# Patient Record
Sex: Male | Born: 1945 | Race: White | Hispanic: No | Smoking: Former smoker
Health system: Southern US, Community
[De-identification: ages and names within clinical notes are randomized; demographics above are authoritative.]

## PROBLEM LIST (undated history)

## (undated) DIAGNOSIS — N189 Chronic kidney disease, unspecified: Secondary | ICD-10-CM

## (undated) DIAGNOSIS — E785 Hyperlipidemia, unspecified: Secondary | ICD-10-CM

## (undated) DIAGNOSIS — M549 Dorsalgia, unspecified: Secondary | ICD-10-CM

## (undated) DIAGNOSIS — H35039 Hypertensive retinopathy, unspecified eye: Secondary | ICD-10-CM

## (undated) DIAGNOSIS — I10 Essential (primary) hypertension: Secondary | ICD-10-CM

## (undated) DIAGNOSIS — R001 Bradycardia, unspecified: Secondary | ICD-10-CM

## (undated) DIAGNOSIS — G8929 Other chronic pain: Secondary | ICD-10-CM

## (undated) DIAGNOSIS — G5603 Carpal tunnel syndrome, bilateral upper limbs: Secondary | ICD-10-CM

## (undated) DIAGNOSIS — M1990 Unspecified osteoarthritis, unspecified site: Secondary | ICD-10-CM

## (undated) DIAGNOSIS — R51 Headache: Secondary | ICD-10-CM

## (undated) DIAGNOSIS — J189 Pneumonia, unspecified organism: Secondary | ICD-10-CM

## (undated) DIAGNOSIS — G709 Myoneural disorder, unspecified: Secondary | ICD-10-CM

## (undated) DIAGNOSIS — Z973 Presence of spectacles and contact lenses: Secondary | ICD-10-CM

## (undated) DIAGNOSIS — F32A Depression, unspecified: Secondary | ICD-10-CM

## (undated) DIAGNOSIS — J449 Chronic obstructive pulmonary disease, unspecified: Secondary | ICD-10-CM

## (undated) DIAGNOSIS — Z950 Presence of cardiac pacemaker: Secondary | ICD-10-CM

## (undated) DIAGNOSIS — G4700 Insomnia, unspecified: Secondary | ICD-10-CM

## (undated) DIAGNOSIS — F329 Major depressive disorder, single episode, unspecified: Secondary | ICD-10-CM

## (undated) DIAGNOSIS — K219 Gastro-esophageal reflux disease without esophagitis: Secondary | ICD-10-CM

## (undated) HISTORY — PX: MULTIPLE TOOTH EXTRACTIONS: SHX2053

## (undated) HISTORY — DX: Depression, unspecified: F32.A

## (undated) HISTORY — PX: EYE SURGERY: SHX253

## (undated) HISTORY — PX: INSERT / REPLACE / REMOVE PACEMAKER: SUR710

## (undated) HISTORY — DX: Hyperlipidemia, unspecified: E78.5

## (undated) HISTORY — DX: Headache: R51

## (undated) HISTORY — DX: Essential (primary) hypertension: I10

## (undated) HISTORY — PX: CATARACT EXTRACTION: SUR2

## (undated) HISTORY — DX: Dorsalgia, unspecified: M54.9

## (undated) HISTORY — PX: BACK SURGERY: SHX140

## (undated) HISTORY — DX: Insomnia, unspecified: G47.00

## (undated) HISTORY — PX: HEMORRHOID SURGERY: SHX153

## (undated) HISTORY — DX: Major depressive disorder, single episode, unspecified: F32.9

## (undated) HISTORY — DX: Bradycardia, unspecified: R00.1

## (undated) HISTORY — DX: Hypertensive retinopathy, unspecified eye: H35.039

## (undated) HISTORY — PX: PACEMAKER INSERTION: SHX728

## (undated) HISTORY — DX: Other chronic pain: G89.29

---

## 1997-11-13 ENCOUNTER — Ambulatory Visit (HOSPITAL_COMMUNITY): Admission: RE | Admit: 1997-11-13 | Discharge: 1997-11-13 | Payer: Self-pay | Admitting: Family Medicine

## 2005-11-26 ENCOUNTER — Emergency Department (HOSPITAL_COMMUNITY): Admission: EM | Admit: 2005-11-26 | Discharge: 2005-11-26 | Payer: Self-pay | Admitting: Emergency Medicine

## 2005-12-22 ENCOUNTER — Ambulatory Visit (HOSPITAL_COMMUNITY): Admission: RE | Admit: 2005-12-22 | Discharge: 2005-12-23 | Payer: Self-pay | Admitting: Cardiology

## 2006-02-01 ENCOUNTER — Emergency Department (HOSPITAL_COMMUNITY): Admission: EM | Admit: 2006-02-01 | Discharge: 2006-02-01 | Payer: Self-pay | Admitting: Emergency Medicine

## 2008-04-07 ENCOUNTER — Encounter: Admission: RE | Admit: 2008-04-07 | Discharge: 2008-04-07 | Payer: Self-pay | Admitting: Orthopedic Surgery

## 2009-11-28 ENCOUNTER — Encounter: Payer: Self-pay | Admitting: Internal Medicine

## 2010-06-24 ENCOUNTER — Encounter: Payer: Self-pay | Admitting: Internal Medicine

## 2010-06-24 ENCOUNTER — Ambulatory Visit (INDEPENDENT_AMBULATORY_CARE_PROVIDER_SITE_OTHER): Payer: Medicare Other | Admitting: Internal Medicine

## 2010-06-24 DIAGNOSIS — Z950 Presence of cardiac pacemaker: Secondary | ICD-10-CM

## 2010-06-24 DIAGNOSIS — I498 Other specified cardiac arrhythmias: Secondary | ICD-10-CM

## 2010-06-24 DIAGNOSIS — R0609 Other forms of dyspnea: Secondary | ICD-10-CM

## 2010-06-24 DIAGNOSIS — E785 Hyperlipidemia, unspecified: Secondary | ICD-10-CM

## 2010-06-24 DIAGNOSIS — R0989 Other specified symptoms and signs involving the circulatory and respiratory systems: Secondary | ICD-10-CM

## 2010-06-24 DIAGNOSIS — E1169 Type 2 diabetes mellitus with other specified complication: Secondary | ICD-10-CM | POA: Insufficient documentation

## 2010-07-02 NOTE — Assessment & Plan Note (Signed)
Summary: nep. dm x 2; htn , pacemaker secondary to bradycardia. self r...   CC:  pacemaker check.  Pt states there are no cardiac concerns.  History of Present Illness: [name] [name] is  seen at the request of his daughter establish pacemaker care in the wake of Dr. Ty Hilts leaving town.  In 2007 he underwent pacemaker implantation for the operative report describes as chronic bradycardia. The preoperative evaluation not available in the chart. The patient understands that his heart muscle function is normal. He has no history of coronary disease.    He does very diagnoses of COPD and his biggest limitation is now having shortness of breath.   He also has a history of arthritis and hypertension  Current Medications (verified): 1)  Aspirin 81 Mg Tabs (Aspirin) .... Take One Tablet Once Daily 2)  Singulair 10 Mg Tabs (Montelukast Sodium) .... Take One Tablet Once Daily 3)  Simvastatin 80 Mg Tabs (Simvastatin) .... Take One Tablet Once Daily 4)  Gabapentin 100 Mg Caps (Gabapentin) .... Take One Tablet As Needed For Pain.  Pt Takes About 2 Tablets Daily 5)  Losartan Potassium 100 Mg Tabs (Losartan Potassium) .... Take One Tablet Once Daily 6)  Triamterene-Hctz 37.5-25 Mg Tabs (Triamterene-Hctz) .... Take One Tablet Once Daily 7)  Naprosyn 500 Mg Tabs (Naproxen) .... Take One Tablet As Needed For Pain 8)  B Complex-B12  Tabs (B Complex Vitamins) .... Once Daily 9)  Vitamin E 400 Unit Caps (Vitamin E) .... Take One Capsule Once Daily  Allergies (verified): 1)  ! Penicillin 2)  ! Codeine 3)  ! Lopressor  Past History:  Past Medical History: Last updated: 06/21/2010 DM-type II Hypertension Headache Bradycardia-Pacemaker-Medtronic Adapta #ADDRO1/August 20. 2007 Hyperlipidemia Depression Insomnia  Chronic back pain  Past Surgical History: Last updated: 06/21/2010 back surgery hemorrhoidectomy  Family History: Last updated: 06/21/2010 not significant for early coronary  disease  Social History: Last updated: 06/21/2010 Tobacco Use - Former. -quit 1978 Single  Alcohol Use - no  Review of Systems       full review of systems was negative apart from a history of present illness and past medical history.   Vital Signs:  Patient profile:   65 year old male Height:      72 inches Weight:      214 pounds BMI:     29.13 Pulse rate:   76 / minute Pulse rhythm:   regular BP sitting:   122 / 82  (right arm) Cuff size:   large  Vitals Entered By: Judithe Modest CMA (June 24, 2010 5:00 PM)  Physical Exam  General:  Alert and oriented older Caucasian male appearing his stated age in no acute distress. HEENT  normal . Neck veins were flat; carotids brisk and full without bruits. No lymphadenopathy. Back without kyphosis. Lungs clear. Heart sounds regular without murmurs or gallops. PMI nondisplaced. Abdomen soft with active bowel sounds without midline pulsation or hepatomegaly. Femoral pulses and distal pulses intact. Extremities were without clubbing cyanosis or edemaSkin warm and dry.no lymphadenopathy; neck was supple  Neurological exam grossly normal; affect is engaging    EKG  Procedure date:  06/24/2010  Findings:      atrial paced  Impression & Recommendations:  Problem # 1:  DYSPNEA ON EXERTION (ICD-786.09) the patient has dyspnea on exertion. This could either be secondary to COPD or perhaps to his chronotropic incompetence identified by interrogation of his pacemaker. In hopes that it is the latter we have reprogrammed his device to  increase his maximal heartsensor rate as well as his ADL rate.  He will let us know His updated medication list for this problem includes:    Aspirin 81 Mg Tabs (Aspirin) .[name]... Take one tablet once daily    Losartan Potassium 100 Mg Tabs (Losartan potassium) .[name]... Take one tablet once daily    Triamterene-hctz 37.5-25 Mg Tabs (Triamterene-hctz) .[name]... Take one tablet once daily  Problem # 2:   HYPERLIPIDEMIA, BORDERLINE (ICD-272.4) will change his simvsattin to 40 mg  His updated medication list for this problem includes:    Simvastatin 80 Mg Tabs (Simvastatin) .[name]... 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 Take one tablet once daily  Patient Instructions: 1)  Your physician recommends that you schedule a follow-up appointment in: 3 MONTHS WITH DR Graciela Husbands 2)  Your physician recommends that you continue on your current medications as directed. Please refer to the Current Medication list given to you today.DECREASE SIMVASTATIN TO 40 MG once daily

## 2010-07-11 NOTE — Cardiovascular Report (Signed)
Summary: Office Visit   Office Visit   Imported By: Roderic Ovens 07/04/2010 16:03:25  _____________________________________________________________________  External Attachment:    Type:   Image     Comment:   External Document

## 2010-07-23 NOTE — Letter (Signed)
Summary: [facility] 2008-2011  [facility] 2008-2011   Imported By: Marylou Mccoy 07/15/2010 10:07:51  _____________________________________________________________________  External Attachment:    Type:   Image     Comment:   External Document

## 2010-09-17 ENCOUNTER — Encounter: Payer: Self-pay | Admitting: Internal Medicine

## 2010-09-20 NOTE — Discharge Summary (Signed)
[name] [name], [name] [name]               ACCOUNT NO.:  [id number]   MEDICAL RECORD NO.:  [id number]          PATIENT TYPE:  OIB   LOCATION:  6527                         FACILITY:  [facility]   PHYSICIAN:  Francisca December, M.D.  DATE OF BIRTH:  [birth year]   DATE OF ADMISSION:  12/22/2005  DATE OF DISCHARGE:  12/23/2005                                 DISCHARGE SUMMARY   ADDENDUM   CHEST X-RAYS:  Chest x-ray results:  Satisfactory placement of permanent  pacemaker. No pneumothorax. No evidence of active chest disease.   The patient is being discharged to home in stable condition.      Tylene Fantasia, [place]      Francisca December, M.D.  Electronically Signed    RDM/MEDQ  D:  12/23/2005  T:  12/24/2005  Job:  [id number]   cc:   Melida Quitter, M.D.

## 2010-09-20 NOTE — Op Note (Signed)
NAMEFERNIE, [name] [name]               ACCOUNT NO.:  [id number]   MEDICAL RECORD NO.:  [id number]          PATIENT TYPE:  AMB   LOCATION:  SDS                          FACILITY:  [facility]   PHYSICIAN:  Francisca December, M.D.  DATE OF BIRTH:  08-29-1945   DATE OF PROCEDURE:  12/22/2005  DATE OF DISCHARGE:                                 OPERATIVE REPORT   PROCEDURE PERFORMED:  1. Insertion dual-chamber permanent transvenous pacemaker.  2. Left subclavian venogram.   INDICATION:  Mr. Vitug is a 65 year old man whose developed a chronic  bradycardia which was initially medically treated with aminophylline  however, has become more prevalent.  The patient reports symptoms of dyspnea  and increasing fatigue/lethargy.  Heart rates have been documented as low as  37 beats per minute while awake.  The patient is brought to the  catheterization laboratory at this time for insertion of a permanent dual-  chamber transvenous pacemaker with a diagnosis of symptomatic sinus node  dysfunction.   DESCRIPTION OF PROCEDURE:  The patient was brought to the cardiac  catheterization laboratory in the fasting state.  The left prepectoral  region was prepped and draped in the usual sterile fashion.  Local  anesthesia was obtained with infiltration of 1% lidocaine with epinephrine  throughout the left prepectoral region.  The left subclavian venogram was  then performed with a peripheral injection of 20 mL of Omnipaque.  A digital  cine angiogram was obtained and road mapped in the AP projection to guide  future left subclavian puncture.  The venogram did demonstrate the vein to  be widely patent and coursing in a normal fashion over the anterior surface  of the first rib and beneath the middle third of the clavicle.  There was no  evidence for persistence of the left superior vena cava.   A 6-7 cm incision was then made in the deltopectoral groove and this was  carried down by sharp dissection and  electrocautery to the prepectoral  fascia.  There a plane was lifted and a pocket formed inferiorly and  medially utilizing blunt dissection electrocautery.  The pocket was then  packed with a 1% kanamycin soaked gauze.  The left subclavian vein was then  punctured twice using an 18-gauge thin-wall needle through which was passed  a 0.038-inch tight J guidewire.  Over the initial guidewire, a 7-French  tearaway sheath and dilator were advanced.  The dilator and wire were  removed and the ventricular lead was advanced to the level of the right  atrium and the sheath was torn away.  Using standard technique and  fluoroscopic landmarks, the lead was manipulated into the right ventricular  apex.  There excellent pacing parameters were obtained as will be noted  below.  The lead was tested for diaphragmatic pacing at 10 volts and none  was found.  The lead was then sutured into place using three separate #0  silk ligatures.  Over the remaining guidewire, a 9-French tearaway sheath  and dilator were advanced.  The dilator was removed, the wire was allowed to  remain in place and  the atrial lead was advanced to the level of the right  atrium.  The sheath was torn away.  A figure-of-eight hemostasis suture was  placed in the pectoralis muscle around the insertion site of the leads to  control backbleeding.  Using standard technique and fluoroscopic landmarks,  the lead was manipulated into the right atrial appendage.  There adequate  pacing parameters were obtained as will be noted below.  This was an active  fixation lead and the screw was advanced as appropriate.  The lead was  tested for diaphragmatic pacing at 10 volts and none was found.  The lead  was then sutured into place using three separate #0 silk ligatures.  The  remaining guidewire and the kanamycin soaked gauze were removed from the  pocket and the pocket was copiously irrigated using 1% kanamycin solution.  The leads were then  attached to the pacing generator carefully identifying  each by its serial number and placing each into the appropriate receptacle.  Each lead was tightened into place and tested for security.  The leads were  then wound beneath the pacing generator and the generator was placed in the  pocket.  The pocket was inspected for bleeding and none was found.  The  pocket was then closed using 2-0 Vicryl in a running fashion for the  subcutaneous layer.  The skin was approximated using 4-0 Vicryl in a running  subcuticular fashion.  Steri-Strips and a sterile dressing were applied and  the patient was transported to the recovery area in stable condition in A-  pace, V-sense mode.   EQUIPMENT DATA:  The pacing generator is a Medtronic Adapta model #ADDRO1,  serial [id number].  The atrial lead is a Medtronic model [id number], serial  [id number].  The ventricular lead is Medtronic model [id number], serial  [id number].   PACING DATA:  The ventricular lead detected a 9.1 mV R wave.  The pacing  threshold was 0.4 volts at 0.5 msec pulse width.  The impedance was 731 ohms  resulting in a current at capture threshold was 0.5 MA.  The atrial lead  detected a 2 mV P-wave.  The pacing threshold was 0.4 volts at 0.5 msec  pulse width.  The impedance was 515 ohms resulting in a current at capture  threshold of 3.5 MA.      Francisca December, M.D.  Electronically Signed     JHE/MEDQ  D:  12/22/2005  T:  12/22/2005  Job:  [id number]   cc:   Holley Bouche, M.D.

## 2010-09-20 NOTE — Discharge Summary (Signed)
[name] [name], [name] [name]               ACCOUNT NO.:  [id number]   MEDICAL RECORD NO.:  [id number]          PATIENT TYPE:  OIB   LOCATION:  6527                         FACILITY:  [facility]   PHYSICIAN:  Francisca December, M.D.  DATE OF BIRTH:  1945/06/27   DATE OF ADMISSION:  12/22/2005  DATE OF DISCHARGE:                                 DISCHARGE SUMMARY   ADMISSION DIAGNOSIS:  Bradycardia.   DISCHARGE DIAGNOSES:  1. Bradycardia status post dual-chamber permanent pacemaker implantation      on August 20. 2007.  2. Hypertension.  3. Dyslipidemia.  4. Chronic insomnia.  5. Depression with sleep disorder.  6. DJD.  7. Mechanical low back pain.   PROCEDURES:  Dual-chamber permanent pacemaker implantation on December 22, 2005.   HOSPITAL COURSE:  Mr. Wacker is a 65 year old Caucasian male with a with a  history of hypertension and dyslipidemia.  He presented to the [facility]  on December 22, 2005 for a dual-chamber permanent pacemaker  implantation secondary to bradycardia.  A Medtronic device was implanted  without complications and minimal blood loss.  The procedure was well  tolerated by the patient.  On December 23, 2005, the Medtronic representative  presented for pacemaker interrogation  The device was functioning normally  without episode so no changes were made during this session.  All electrical  parameters were within normal limits.  Upon evaluation this morning, the  patient complained of pain at the permanent pacemaker insertion site,  otherwise there were no complaints.  The wound site was warm and dry without  bleeding or oozing.  On telemetry the patient was in normal sinus rhythm  with atrial pacing at a ventricular rate of 62 beats per minute.  A chest x-  ray was obtained this morning, however the results are pending.  Pending the  results of the chest x-ray, the patient will be discharged to home today  after being seen by Dr. Amil Amen, his cardiologist.  Discharge  orders will be  written by Dr. Amil Amen.   LABORATORY DATA:  Preop laboratory data is as follows: sodium 140, potassium  5.6, chloride 100. CO2 33.  BUN five, creatinine 1.1, glucose 107, white  blood count 5.9, hemoglobin 14.2, hematocrit 42.3 platelets 246,  PT 11.7  INR 0.94, PTT 32,   Preoperative x-ray is as follows.  Portable chest x-ray November 26, 2005, no  active disease.   EKG December 12, 2005, sinus bradycardia with an incomplete right bundle  branch block with a ventricular rate of 55 beats per minute with no ischemic  changes.  EKG December 22, 2005 usual P axis with possible ectopic atrial bradycardia  with a ventricular rate of 59 beats per minute with no ischemic changes.   CONDITION ON DISCHARGE:  The patient is currently in stable condition  without any complaints of chest pain, shortness of breath, diaphoresis, or  dizziness.  His only complaint upon evaluation is pain at the incision site  of the permanent pacemaker.   DISCHARGE MEDICATIONS:  1. Altace 10 mg daily.  2. Enteric-coated aspirin 81 mg daily.  3. Simvastatin 40 mg every other day.  4. Naprosyn 500 mg twice daily.  5. Vicodin as needed.  6. Tylenol 650  mg as needed.  7. CO-24 400 mg daily.  8. Darvocet-N-100 tablets every 4-6 hours as needed for pain.  This      represents a new prescription.  A prescription was given with one      refill   DISCHARGE INSTRUCTIONS:  1. The patient was instructed to continue to follow a heart healthy diet      including low salt, low cholesterol, and low-fat  2. The patient was instructed to avoid driving for 2 weeks.  3. The patient was instructed to avoid lifting for 6-8 weeks.  4. The patient was instructed to avoid showering for 1 week and may shower      and begin driving on December 29, 2005.  5. The patient may begin raising his left arm to his shoulder on December 25, 2005.  He may begin raising his left arm to his left ear on December 27, 2005.  He may  begin raising his left arm to the top of his left      side of his head on December 29, 2005.  He may begin raising his left arm      across to his right ear on January 01, 2006.  6. The patient has been instructed to keep the wound area clean and dry      and not to get this area wet for 1 week.  7. The patient has been instructed that the Steri-Strips will fall off of      the wound, that he should not pull them off..  He has also been      instructed to not apply any creams, oils, or ointment to the wound      area.  8. The patient has been instructed that he should call the [facility]      cardiology office at once and [phone] if he notices any drainage or      discharge from the wound, any swelling or bruising at the site or if he      develops a fever greater than 101 degrees Fahrenheit.  9. The patient was given supplemental discharge instructions for pacemaker      patients to take home with him.   FOLLOW-UP ARRANGEMENTS:  The patient has been scheduled for a wound check at  the [facility] cardiology office on 01/06/2006 at 10:30 a.m. with a physician  extender.  He is scheduled to call [phone] if he is unable to make the  scheduled appointment.  1. The patient has been scheduled for follow-up visit with Dr. Corliss Marcus on 02/21/2006 at 1 o'clock p.m..      He should call [phone] if he is unable to make the scheduled      appointment.  2. The patient is scheduled to call [facility] cardiology should he develop any      problems or have questions in the interim.      Tylene Fantasia, [place]      Francisca December, M.D.  Electronically Signed    RDM/MEDQ  D:  12/23/2005  T:  12/23/2005  Job:  [id number]   cc:   Melida Quitter, M.D.

## 2010-09-24 ENCOUNTER — Ambulatory Visit (INDEPENDENT_AMBULATORY_CARE_PROVIDER_SITE_OTHER): Payer: Medicare Other | Admitting: Internal Medicine

## 2010-09-24 ENCOUNTER — Encounter: Payer: Self-pay | Admitting: Internal Medicine

## 2010-09-24 DIAGNOSIS — I498 Other specified cardiac arrhythmias: Secondary | ICD-10-CM

## 2010-09-24 DIAGNOSIS — Z950 Presence of cardiac pacemaker: Secondary | ICD-10-CM | POA: Insufficient documentation

## 2010-09-24 DIAGNOSIS — I495 Sick sinus syndrome: Secondary | ICD-10-CM | POA: Insufficient documentation

## 2010-09-24 DIAGNOSIS — R030 Elevated blood-pressure reading, without diagnosis of hypertension: Secondary | ICD-10-CM

## 2010-09-24 DIAGNOSIS — I4589 Other specified conduction disorders: Secondary | ICD-10-CM | POA: Insufficient documentation

## 2010-09-24 DIAGNOSIS — IMO0001 Reserved for inherently not codable concepts without codable children: Secondary | ICD-10-CM

## 2010-09-24 DIAGNOSIS — R0609 Other forms of dyspnea: Secondary | ICD-10-CM

## 2010-09-24 DIAGNOSIS — R0989 Other specified symptoms and signs involving the circulatory and respiratory systems: Secondary | ICD-10-CM

## 2010-09-24 NOTE — Assessment & Plan Note (Signed)
The patient's device was interrogated and the information was fully reviewed.  The device was reprogrammed to alter the rate response with a slower slope to ADL and a slower slope to the upper sensor rate

## 2010-09-24 NOTE — Assessment & Plan Note (Signed)
The patient is about 80% atrially paced. Looking at the heart rate histogram there was a right shift it appears to be related to overzealous exertion rates. We reviewed programmed the ADL rate and had him walk again and he did somewhat better.

## 2010-09-24 NOTE — Progress Notes (Signed)
  HPI  [name] [name] is a 65 y.o. male Seen in followup for pacemaker implanted by Dr. Ty Hilts years ago establishing with [place] February 2012. It was originally placed for chronotropic incompetence and sinus node dysfunction.  When we saw him last in February he was having significant dyspnea on exertion and the question was whether was related to his COPD or to inadequate heart rate response .  He is considerably improved There is no edema or chest pain  Past Medical History  Diagnosis Date  . Diabetes mellitus     type II  . Hypertension   . Headache   . Bradycardia     pacemaker - Medtronic Adapta #ADDRO1 - December 22, 2005  . Hyperlipidemia   . Depression   . Insomnia   . Chronic back pain     Past Surgical History  Procedure Date  . Back surgery   . Hemorrhoid surgery     Current Outpatient Prescriptions  Medication Sig Dispense Refill  . aspirin 81 MG tablet Take 81 mg by mouth daily.        . B Complex Vitamins (B COMPLEX-B12) TABS Take by mouth daily.        [name] gabapentin (NEURONTIN) 100 MG tablet Take 100 mg by mouth daily. PT takes about 2 tablets daily       . losartan (COZAAR) 100 MG tablet Take 100 mg by mouth daily.        . montelukast (SINGULAIR) 10 MG tablet Take 10 mg by mouth daily.        . naproxen (NAPROSYN) 500 MG tablet Take 500 mg by mouth as needed.        . simvastatin (ZOCOR) 80 MG tablet Take 1/2 tab daily      . triamterene-hydrochlorothiazide (MAXZIDE-25) 37.5-25 MG per tablet Take 1 tablet by mouth daily.        . vitamin E 400 UNIT capsule Take 400 Units by mouth daily.          Allergies  Allergen Reactions  . Codeine   . Metoprolol Tartrate   . Penicillins     Review of Systems negative except from HPI and PMH  Physical Exam Well developed and well nourished in no acute distress HENT normal E scleral and icterus clear Neck Supple JVP flat; carotids brisk and full Clear to ausculation Regular rate and rhythm, no murmurs gallops  or rub Soft with active bowel sounds No clubbing cyanosis and edema Alert and oriented, grossly normal motor and sensory function Skin Warm and Dry     Assessment and  Plan

## 2010-09-24 NOTE — Patient Instructions (Signed)
Your physician wants you to follow-up in:  12 months.  You will receive a reminder letter in the mail two months in advance. If you don't receive a letter, please call our office to schedule the follow-up appointment.   

## 2010-09-24 NOTE — Assessment & Plan Note (Signed)
Improved with reprogramming

## 2010-09-24 NOTE — Assessment & Plan Note (Signed)
The patient's headache is better; blood pressure remains mildly elevated. He will keep it on this. He'll followup with his PCP

## 2010-09-26 ENCOUNTER — Encounter: Payer: Medicare Other | Admitting: *Deleted

## 2010-12-26 ENCOUNTER — Encounter: Payer: Medicare Other | Admitting: *Deleted

## 2011-01-02 ENCOUNTER — Encounter: Payer: Self-pay | Admitting: *Deleted

## 2011-01-09 ENCOUNTER — Telehealth: Payer: Self-pay | Admitting: Internal Medicine

## 2011-01-09 NOTE — Telephone Encounter (Signed)
Spoke with pt. He was able to figure out the question he had.

## 2011-01-09 NOTE — Telephone Encounter (Signed)
Pt stated he was suppose to have been called yesterday for PM check and was not called.  Please call him and advise.

## 2011-06-24 ENCOUNTER — Ambulatory Visit (INDEPENDENT_AMBULATORY_CARE_PROVIDER_SITE_OTHER): Payer: Medicare Other | Admitting: *Deleted

## 2011-06-24 ENCOUNTER — Encounter: Payer: Self-pay | Admitting: Internal Medicine

## 2011-06-24 DIAGNOSIS — Z950 Presence of cardiac pacemaker: Secondary | ICD-10-CM

## 2011-06-24 DIAGNOSIS — I495 Sick sinus syndrome: Secondary | ICD-10-CM

## 2011-06-27 LAB — REMOTE PACEMAKER DEVICE
AL IMPEDENCE PM: 436 Ohm
AL THRESHOLD: 0.625 V
ATRIAL PACING PM: 62
BAMS-0001: 175 {beats}/min
BATTERY VOLTAGE: 2.76 V
RV LEAD AMPLITUDE: 11.2 mv
RV LEAD IMPEDENCE PM: 596 Ohm
RV LEAD THRESHOLD: 1.125 V

## 2011-07-02 ENCOUNTER — Encounter: Payer: Self-pay | Admitting: *Deleted

## 2011-07-03 NOTE — Progress Notes (Signed)
Remote pacer check  

## 2011-09-25 ENCOUNTER — Encounter: Payer: Self-pay | Admitting: Internal Medicine

## 2011-09-25 ENCOUNTER — Ambulatory Visit (INDEPENDENT_AMBULATORY_CARE_PROVIDER_SITE_OTHER): Payer: Medicare Other | Admitting: Internal Medicine

## 2011-09-25 VITALS — BP 128/86 | HR 60 | Ht 72.0 in | Wt 207.1 lb

## 2011-09-25 DIAGNOSIS — R030 Elevated blood-pressure reading, without diagnosis of hypertension: Secondary | ICD-10-CM

## 2011-09-25 DIAGNOSIS — IMO0001 Reserved for inherently not codable concepts without codable children: Secondary | ICD-10-CM

## 2011-09-25 DIAGNOSIS — I498 Other specified cardiac arrhythmias: Secondary | ICD-10-CM

## 2011-09-25 DIAGNOSIS — Z950 Presence of cardiac pacemaker: Secondary | ICD-10-CM

## 2011-09-25 DIAGNOSIS — I495 Sick sinus syndrome: Secondary | ICD-10-CM

## 2011-09-25 LAB — PACEMAKER DEVICE OBSERVATION
AL AMPLITUDE: 2.8 mv
AL THRESHOLD: 0.625 V
BATTERY VOLTAGE: 2.76 V
RV LEAD AMPLITUDE: 11.2 mv
RV LEAD IMPEDENCE PM: 558 Ohm
VENTRICULAR PACING PM: 0.5

## 2011-09-25 NOTE — Assessment & Plan Note (Signed)
Well regulaed with current rate response settings

## 2011-09-25 NOTE — Patient Instructions (Addendum)
Remote monitoring is used to monitor your Pacemaker of ICD from home. This monitoring reduces the number of office visits required to check your device to one time per year. It allows us to keep an eye on the functioning of your device to ensure it is working properly. You are scheduled for a device check from home on January 01, 2012. You may send your transmission at any time that day. If you have a wireless device, the transmission will be sent automatically. After your physician reviews your transmission, you will receive a postcard with your next transmission date.  Your physician wants you to follow-up in: 1 year with Dr Klein.  You will receive a reminder letter in the mail two months in advance. If you don't receive a letter, please call our office to schedule the follow-up appointment.  Your physician recommends that you continue on your current medications as directed. Please refer to the Current Medication list given to you today.  

## 2011-09-25 NOTE — Progress Notes (Signed)
  HPI  [name] [name] is a 66 y.o. male Seen in followup for pacemaker implanted by Dr. Ty Hilts years ago establishing with [place] February 2012. 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  There have been no palpitations, lightheadedness or syncope.      Past Medical History  Diagnosis Date  . Diabetes mellitus     type II  . Hypertension   . Headache   . Bradycardia     pacemaker - Medtronic Adapta #ADDRO1 - December 22, 2005  . Hyperlipidemia   . Depression   . Insomnia   . Chronic back pain     Past Surgical History  Procedure Date  . Back surgery   . Hemorrhoid surgery     Current Outpatient Prescriptions  Medication Sig Dispense Refill  . albuterol (PROVENTIL HFA;VENTOLIN HFA) 108 (90 BASE) MCG/ACT inhaler Inhale 2 puffs into the lungs as needed.      [name] aspirin 81 MG tablet Take 81 mg by mouth daily.        . B Complex Vitamins (B COMPLEX-B12) TABS Take by mouth daily.        [name] gabapentin (NEURONTIN) 100 MG tablet Take 100 mg by mouth daily. PT takes about 2 tablets daily       . losartan (COZAAR) 100 MG tablet Take 100 mg by mouth daily.        . naproxen (NAPROSYN) 500 MG tablet Take 500 mg by mouth as needed.        . simvastatin (ZOCOR) 80 MG tablet Take 1/2 tab daily      . triamterene-hydrochlorothiazide (MAXZIDE-25) 37.5-25 MG per tablet Take 1 tablet by mouth daily.        . vitamin E 400 UNIT capsule Take 400 Units by mouth daily.          Allergies  Allergen Reactions  . Codeine   . Metoprolol Tartrate   . Penicillins     Review of Systems negative except from HPI and PMH  Physical Exam BP 128/86  Pulse 60  Ht 6' (1.829 m)  Wt 207 lb 1.9 oz (93.949 kg)  BMI 28.09 kg/m2 Well developed and well nourished in no acute distress HENT normal E scleral and  icterus clear Neck Supple JVP flat; carotids brisk and full Pocket well healed Clear to ausculation Regular rate and rhythm, no murmurs gallops or rub Soft with active bowel sounds No clubbing cyanosis none Edema Alert and oriented, grossly normal motor and sensory function Skin Warm and Dry    Assessment and  Plan

## 2011-09-25 NOTE — Assessment & Plan Note (Signed)
The patient's device was interrogated.  The information was reviewed. No changes were made in the programming.    

## 2011-09-25 NOTE — Assessment & Plan Note (Signed)
Well controlled today  Labs followed by Dr Tiburcio Pea

## 2012-01-01 ENCOUNTER — Encounter: Payer: Medicare Other | Admitting: *Deleted

## 2012-01-08 ENCOUNTER — Encounter: Payer: Self-pay | Admitting: *Deleted

## 2012-01-12 ENCOUNTER — Ambulatory Visit (INDEPENDENT_AMBULATORY_CARE_PROVIDER_SITE_OTHER): Payer: Medicare Other | Admitting: *Deleted

## 2012-01-12 DIAGNOSIS — I495 Sick sinus syndrome: Secondary | ICD-10-CM

## 2012-01-12 DIAGNOSIS — I498 Other specified cardiac arrhythmias: Secondary | ICD-10-CM

## 2012-01-12 LAB — REMOTE PACEMAKER DEVICE
AL IMPEDENCE PM: 430 Ohm
RV LEAD AMPLITUDE: 16 mv
RV LEAD THRESHOLD: 1.5 V
VENTRICULAR PACING PM: 1

## 2012-02-17 ENCOUNTER — Encounter: Payer: Self-pay | Admitting: *Deleted

## 2012-02-27 ENCOUNTER — Encounter: Payer: Self-pay | Admitting: Internal Medicine

## 2012-04-19 ENCOUNTER — Encounter: Payer: Self-pay | Admitting: Internal Medicine

## 2012-04-19 ENCOUNTER — Ambulatory Visit (INDEPENDENT_AMBULATORY_CARE_PROVIDER_SITE_OTHER): Payer: Medicare Other | Admitting: *Deleted

## 2012-04-19 DIAGNOSIS — I495 Sick sinus syndrome: Secondary | ICD-10-CM

## 2012-04-19 DIAGNOSIS — Z950 Presence of cardiac pacemaker: Secondary | ICD-10-CM

## 2012-05-02 LAB — REMOTE PACEMAKER DEVICE
AL AMPLITUDE: 2.8 mv
ATRIAL PACING PM: 73
BATTERY VOLTAGE: 2.75 V
RV LEAD AMPLITUDE: 16 mv
RV LEAD THRESHOLD: 1.5 V

## 2012-05-11 ENCOUNTER — Encounter: Payer: Self-pay | Admitting: *Deleted

## 2012-07-26 ENCOUNTER — Encounter: Payer: Self-pay | Admitting: Internal Medicine

## 2012-07-26 ENCOUNTER — Other Ambulatory Visit: Payer: Self-pay

## 2012-07-26 ENCOUNTER — Ambulatory Visit (INDEPENDENT_AMBULATORY_CARE_PROVIDER_SITE_OTHER): Payer: Medicare Other | Admitting: *Deleted

## 2012-07-26 DIAGNOSIS — Z950 Presence of cardiac pacemaker: Secondary | ICD-10-CM

## 2012-07-26 DIAGNOSIS — I498 Other specified cardiac arrhythmias: Secondary | ICD-10-CM

## 2012-07-26 DIAGNOSIS — I495 Sick sinus syndrome: Secondary | ICD-10-CM

## 2012-08-04 LAB — REMOTE PACEMAKER DEVICE
AL IMPEDENCE PM: 437 Ohm
ATRIAL PACING PM: 70
BATTERY VOLTAGE: 2.74 V
RV LEAD AMPLITUDE: 16 mv
RV LEAD THRESHOLD: 1.125 V
VENTRICULAR PACING PM: 1

## 2012-08-25 ENCOUNTER — Encounter: Payer: Self-pay | Admitting: *Deleted

## 2012-09-29 ENCOUNTER — Encounter: Payer: Self-pay | Admitting: Internal Medicine

## 2012-09-29 ENCOUNTER — Ambulatory Visit (INDEPENDENT_AMBULATORY_CARE_PROVIDER_SITE_OTHER): Payer: Medicare Other | Admitting: Internal Medicine

## 2012-09-29 VITALS — BP 147/90 | HR 60 | Ht 72.0 in | Wt 214.1 lb

## 2012-09-29 DIAGNOSIS — Z950 Presence of cardiac pacemaker: Secondary | ICD-10-CM

## 2012-09-29 DIAGNOSIS — I495 Sick sinus syndrome: Secondary | ICD-10-CM

## 2012-09-29 LAB — PACEMAKER DEVICE OBSERVATION
AL AMPLITUDE: 4 mv
BATTERY VOLTAGE: 2.74 V

## 2012-09-29 MED ORDER — TRIAMTERENE-HCTZ 37.5-25 MG PO TABS: 1.0000 | ORAL_TABLET | Freq: Every day | ORAL | Status: DC

## 2012-09-29 NOTE — Patient Instructions (Addendum)
Your physician wants you to follow-up in: 1 YEAR with Dr Klein.  You will receive a reminder letter in the mail two months in advance. If you don't receive a letter, please call our office to schedule the follow-up appointment.  Your physician recommends that you continue on your current medications as directed. Please refer to the Current Medication list given to you today.  

## 2012-09-29 NOTE — Assessment & Plan Note (Signed)
Heart rate excursion is modestly limited but he is limited functionally by his orthopedic issues so will not reprogrammed his device

## 2012-09-29 NOTE — Assessment & Plan Note (Signed)
The patient's device was interrogated.  The information was reviewed. No changes were made in the programming.    

## 2012-09-29 NOTE — Progress Notes (Signed)
Patient Care Team: [name], MD as PCP - General (Family Medicine)   HPI  [name] [name] is a 67 y.o. male Seen in followup for pacemaker implanted by Dr. Ty Hilts years ago establishing with [place] February 2012. 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 There have been no palpitations, lightheadedness or syncope     Past Medical History  Diagnosis Date  . Diabetes mellitus     type II  . Hypertension   . Headache(784.0)   . Bradycardia     pacemaker - Medtronic Adapta #ADDRO1 - December 22, 2005  . Hyperlipidemia   . Depression   . Insomnia   . Chronic back pain     Past Surgical History  Procedure Laterality Date  . Back surgery    . Hemorrhoid surgery      Current Outpatient Prescriptions  Medication Sig Dispense Refill  . albuterol (PROVENTIL HFA;VENTOLIN HFA) 108 (90 BASE) MCG/ACT inhaler Inhale 2 puffs into the lungs as needed.      [name] aspirin 81 MG tablet Take 81 mg by mouth daily.        . B Complex Vitamins (B COMPLEX-B12) TABS Take by mouth daily.        [name] gabapentin (NEURONTIN) 100 MG tablet Take 100 mg by mouth daily. PT takes about 2 tablets daily       . losartan (COZAAR) 100 MG tablet Take 50 mg by mouth daily.       . naproxen (NAPROSYN) 500 MG tablet Take 500 mg by mouth as needed.        . Omega-3 Fatty Acids (FISH OIL PO) Take 1,000 mg by mouth.      . simvastatin (ZOCOR) 80 MG tablet Take 1/2 tab daily      . triamterene-hydrochlorothiazide (MAXZIDE-25) 37.5-25 MG per tablet Take 1 tablet by mouth daily.        . vitamin E 400 UNIT capsule Take 400 Units by mouth daily.         No current facility-administered medications for this visit.    Allergies  Allergen Reactions  . Codeine   . Metoprolol Tartrate   . Penicillins     Review of Systems  negative except from HPI and PMH  Physical Exam BP 168/92  Pulse 76  Ht 6' (1.829 m)  Wt 214 lb 1.9 oz (97.124 kg)  BMI 29.03 kg/m2 Well developed and well nourished in no acute distress HENT normal E scleral and icterus clear Neck Supple JVP flat; carotids brisk and full Clear to ausculation Regular rate and rhythm, no murmurs gallops or rub Soft with active bowel sounds No clubbing cyanosis none Edema Alert and oriented, grossly normal motor and sensory function Skin Warm and Dry  Repeat blood pressure is 147/90 ECG demonstrates atrial pacing at 72 Intervals 26/10/39 Axis is -50  Assessment and  Plan

## 2012-10-25 ENCOUNTER — Ambulatory Visit
Admission: RE | Admit: 2012-10-25 | Discharge: 2012-10-25 | Disposition: A | Discharge status: Home / Self Care | Payer: Medicare Other | Source: Ambulatory Visit | Attending: Family Medicine | Admitting: Family Medicine

## 2012-10-25 ENCOUNTER — Other Ambulatory Visit: Payer: Self-pay | Admitting: Family Medicine

## 2012-10-25 DIAGNOSIS — M25551 Pain in right hip: Secondary | ICD-10-CM

## 2012-11-09 ENCOUNTER — Encounter (INDEPENDENT_AMBULATORY_CARE_PROVIDER_SITE_OTHER): Payer: Medicare Other | Admitting: Ophthalmology

## 2012-11-09 DIAGNOSIS — H43819 Vitreous degeneration, unspecified eye: Secondary | ICD-10-CM

## 2012-11-09 DIAGNOSIS — E11319 Type 2 diabetes mellitus with unspecified diabetic retinopathy without macular edema: Secondary | ICD-10-CM

## 2012-11-09 DIAGNOSIS — E1139 Type 2 diabetes mellitus with other diabetic ophthalmic complication: Secondary | ICD-10-CM

## 2012-11-09 DIAGNOSIS — E1165 Type 2 diabetes mellitus with hyperglycemia: Secondary | ICD-10-CM

## 2012-11-09 DIAGNOSIS — H2510 Age-related nuclear cataract, unspecified eye: Secondary | ICD-10-CM

## 2012-11-09 DIAGNOSIS — I10 Essential (primary) hypertension: Secondary | ICD-10-CM

## 2012-11-09 DIAGNOSIS — H35039 Hypertensive retinopathy, unspecified eye: Secondary | ICD-10-CM

## 2012-12-01 ENCOUNTER — Telehealth: Payer: Self-pay | Admitting: Internal Medicine

## 2012-12-01 NOTE — Telephone Encounter (Addendum)
Left message for [name] [name] to call  °

## 2012-12-01 NOTE — Telephone Encounter (Signed)
Spoke with kathy,aware the OR will contact us regarding the pace rmaker when procedure is scheduled. She voiced understanding.

## 2012-12-01 NOTE — Telephone Encounter (Signed)
New problem    Upcoming hip replacement . Need cardiac clearance please advise on pacemaker .

## 2012-12-30 ENCOUNTER — Other Ambulatory Visit: Payer: Self-pay | Admitting: Orthopaedic Surgery

## 2013-01-04 ENCOUNTER — Encounter: Payer: Medicare Other | Admitting: *Deleted

## 2013-01-11 ENCOUNTER — Encounter: Payer: Self-pay | Admitting: Internal Medicine

## 2013-01-12 ENCOUNTER — Encounter: Payer: Self-pay | Admitting: *Deleted

## 2013-01-14 ENCOUNTER — Encounter (HOSPITAL_COMMUNITY): Payer: Self-pay | Admitting: Pharmacy Technician

## 2013-01-17 ENCOUNTER — Ambulatory Visit (HOSPITAL_COMMUNITY)
Admission: RE | Admit: 2013-01-17 | Discharge: 2013-01-17 | Disposition: A | Discharge status: Home / Self Care | Payer: Medicare Other | Source: Ambulatory Visit | Attending: Orthopaedic Surgery | Admitting: Orthopaedic Surgery

## 2013-01-17 ENCOUNTER — Encounter (HOSPITAL_COMMUNITY)
Admission: RE | Admit: 2013-01-17 | Discharge: 2013-01-17 | Disposition: A | Discharge status: Home / Self Care | Payer: Medicare Other | Source: Ambulatory Visit | Attending: Orthopaedic Surgery | Admitting: Orthopaedic Surgery

## 2013-01-17 ENCOUNTER — Encounter (HOSPITAL_COMMUNITY): Payer: Self-pay

## 2013-01-17 DIAGNOSIS — Z950 Presence of cardiac pacemaker: Secondary | ICD-10-CM | POA: Insufficient documentation

## 2013-01-17 DIAGNOSIS — M169 Osteoarthritis of hip, unspecified: Secondary | ICD-10-CM | POA: Insufficient documentation

## 2013-01-17 DIAGNOSIS — Z01812 Encounter for preprocedural laboratory examination: Secondary | ICD-10-CM | POA: Insufficient documentation

## 2013-01-17 DIAGNOSIS — I10 Essential (primary) hypertension: Secondary | ICD-10-CM | POA: Insufficient documentation

## 2013-01-17 DIAGNOSIS — Z01818 Encounter for other preprocedural examination: Secondary | ICD-10-CM | POA: Insufficient documentation

## 2013-01-17 DIAGNOSIS — M1610 Unilateral primary osteoarthritis, unspecified hip: Secondary | ICD-10-CM | POA: Insufficient documentation

## 2013-01-17 HISTORY — DX: Unspecified osteoarthritis, unspecified site: M19.90

## 2013-01-17 HISTORY — DX: Gastro-esophageal reflux disease without esophagitis: K21.9

## 2013-01-17 HISTORY — DX: Pneumonia, unspecified organism: J18.9

## 2013-01-17 HISTORY — DX: Myoneural disorder, unspecified: G70.9

## 2013-01-17 HISTORY — DX: Chronic obstructive pulmonary disease, unspecified: J44.9

## 2013-01-17 LAB — URINALYSIS, ROUTINE W REFLEX MICROSCOPIC
Glucose, UA: NEGATIVE mg/dL
Leukocytes, UA: NEGATIVE
Nitrite: NEGATIVE
Specific Gravity, Urine: 1.006 (ref 1.005–1.030)
pH: 7 (ref 5.0–8.0)

## 2013-01-17 LAB — BASIC METABOLIC PANEL
BUN: 16 mg/dL (ref 6–23)
GFR calc non Af Amer: 65 mL/min — ABNORMAL LOW (ref >90)
Glucose, Bld: 139 mg/dL — ABNORMAL HIGH (ref 70–99)
Potassium: 4.1 mEq/L (ref 3.5–5.1)

## 2013-01-17 LAB — CBC WITH DIFFERENTIAL/PLATELET
Basophils Relative: 0 % (ref 0–1)
Eosinophils Absolute: 0.4 10*3/uL (ref 0.0–0.7)
Eosinophils Relative: 6 % — ABNORMAL HIGH (ref 0–5)
HCT: 41.4 % (ref 39.0–52.0)
Hemoglobin: 14.3 g/dL (ref 13.0–17.0)
MCH: 31.8 pg (ref 26.0–34.0)
MCHC: 34.5 g/dL (ref 30.0–36.0)
MCV: 92.2 fL (ref 78.0–100.0)
Monocytes Absolute: 0.8 10*3/uL (ref 0.1–1.0)
Monocytes Relative: 10 % (ref 3–12)
Neutrophils Relative %: 43 % (ref 43–77)

## 2013-01-17 LAB — SURGICAL PCR SCREEN: MRSA, PCR: NEGATIVE

## 2013-01-17 LAB — ABO/RH: ABO/RH(D): A POS

## 2013-01-17 MED ORDER — CHLORHEXIDINE GLUCONATE 4 % EX LIQD: 60.0000 mL | Freq: Once | CUTANEOUS | Status: DC

## 2013-01-17 MED ORDER — VANCOMYCIN HCL IN DEXTROSE 1-5 GM/200ML-% IV SOLN: 1000.0000 mg | INTRAVENOUS | Status: DC

## 2013-01-17 NOTE — Progress Notes (Signed)
01/17/13 0826  OBSTRUCTIVE SLEEP APNEA  Have you ever been diagnosed with sleep apnea through a sleep study? No  Do you snore loudly (loud enough to be heard through closed doors)?  0  Do you often feel tired, fatigued, or sleepy during the daytime? 1  Has anyone observed you stop breathing during your sleep? 0  Do you have, or are you being treated for high blood pressure? 1  BMI more than 35 kg/m2? 0  Age over 69 years old? 1  Neck circumference greater than 40 cm/18 inches? 0  Gender: 1  Obstructive Sleep Apnea Score 4  Score 4 or greater  Results sent to PCP   

## 2013-01-17 NOTE — Pre-Procedure Instructions (Signed)
[name] [name]  01/17/2013   Your procedure is scheduled on: Sept 23 @ 0730  Report to [facility] at 0530 AM.  Call this number if you have problems the morning of surgery: [phone]   Remember:   Do not eat food or drink liquids after midnight.   Take these medicines the morning of surgery with A SIP OF WATER: Albuterol (Proventil), Budesonide-formoterol (symbicort), esomeprazole (nexium), Gabapentin (Neurontin)  Stop taking Aspirin, Aleve, Ibuprofen, Fish oil, Goody's, BC's, Herbal medications.   Do not wear jewelry, make-up or nail polish.  Do not wear lotions, powders, or perfumes. You may wear deodorant.  Do not shave 48 hours prior to surgery. Men may shave face and neck.  Do not bring valuables to the hospital.  [facility] is not responsible                   for any belongings or valuables.  Contacts, dentures or bridgework may not be worn into surgery.  Leave suitcase in the car. After surgery it may be brought to your room.  For patients admitted to the hospital, checkout time is 11:00 AM the day of  discharge.   Patients discharged the day of surgery will not be allowed to drive  home.    Special Instructions: Shower using CHG 2 nights before surgery and the night before surgery.  If you shower the day of surgery use CHG.  Use special wash - you have one bottle of CHG for all showers.  You should use approximately 1/3 of the bottle for each shower.   Please read over the following fact sheets that you were given: Pain Booklet, Coughing and Deep Breathing, Blood Transfusion Information, MRSA Information and Surgical Site Infection Prevention

## 2013-01-17 NOTE — Progress Notes (Addendum)
Cardiologist is Dr Graciela Husbands at [facility].  Pt is unsure if he has had an echo or stress test and if he has it has been years ago.  Pt has a pacer from Medtr[employer] pacer form faxed to Dr Odessa Fleming office.  Ekg noted in epic from 09-29-12  Pt has not has a chest xray in the past.  PCP is Dr. [name]

## 2013-01-18 ENCOUNTER — Encounter (HOSPITAL_COMMUNITY): Payer: Self-pay

## 2013-01-18 NOTE — Progress Notes (Addendum)
Anesthesia Chart Review:  Patient is a 67 year old male scheduled for right THA on 01/25/13 by Dr. Jerl Santos.  History includes Medtronic dual chamber PPM for chronotropic incompetence and sinus node dysfunction (inserted 12/22/05 by Dr. Amil Amen), HTN, HLD, COPD, former smoker, GERD, diet controlled DM2, peripheral neuropathy, arthritis, back surgery. Anginal pain is also marked on his history form--without other details.  PCP is Dr. [name].  Cardiologist is Dr. Graciela Husbands, last visit for PPM follow-up on 09/29/12.  Patient denies any recent cardiac test (none in at least five years).  I called to question patient further about history of anginal pain.  Details were somewhat difficult to obtain from patient, but he initially denied chest pain.  He then added that he didn't have any chest pain that he felt were related to his heart--he feels they are likely "gas pains."  Pains are intermittent for several years.  Pains are located in the center of his chest and only last a few seconds.  There is no jaw or arm pain, syncope, SOB.  He doesn't feel that they are associated with diaphoresis although he reported that he gets "very hot and sweaty" with activity--but no chest pain.  He denies SOB at rest, but has chronic DOE with activities such as walking thru [employer]. He is not very active for the past month due to significant hip pain.  Prior to this he was could do some mowing, weed eating.    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  CXR on 01/17/13 showed: Bandlike lingular density appears marked conspicuous than on 05/02/2011. Given the increase in size, I recommend either chest CT now, or a followup chest radiograph in 6 weeks time to assess for resolution. This recommendation is mainly to make sure that this change in appearance does not represent a malignancy. (Report was called to Agustin Cree at Dr. Nolon Nations  office.  She will forward to MD/PA for review.)  Preoperative labs noted.    Patient with history of PPM, DM, HTN, but no known CAD.  He described a vague history of chest pains or "gas pains" which doesn't appear to be related to activity--although he is currently not very active.  He has not had any recent functional studies.  I reviewed above with anesthesiologist Dr. Noreene Larsson who recommended he have a preoperative cardiology visit with Dr. Graciela Husbands or associate.  I have left a voicemail with Agustin Cree and will follow-up with her tomorrow.     Velna Ochs [facility]Anesthesiology Phone [phone] 01/18/2013 6:38 PM  Addendum: 01/20/2013 5:24 PM Patient was seen at [facility] earlier today by Tereso Newcomer, PA-C.  He is scheduled for a nuclear stress test on 01/24/13.  Of note, a chest CT was ordered and done today that showed: 1. No suspicious pulmonary nodule or mass identified. 2. No acute cardiopulmonary abnormalities. 3. Mildly prominent mediastinal lymph nodes.  I'll follow-up nuclear stress test results once available.    Addendum: 01/24/2013 4:42 PM Spoke with Agustin Cree earlier today. She is aware that stress test report from earlier today is still pending.  I also spoke with switchboard staff at [facility].  Wende Mott PA-C was aware of plans for surgery scheduled 01/25/13, so results should be available in Epic before tomorrow morning.

## 2013-01-19 NOTE — H&P (Signed)
TOTAL HIP ADMISSION H&P  Patient is admitted for right total hip arthroplasty.  Subjective:  Chief Complaint: right hip pain  HPI: [name] [name], 67 y.o. male, has a history of pain and functional disability in the right hip(s) due to arthritis and patient has failed non-surgical conservative treatments for greater than 12 weeks to include NSAID's and/or analgesics, flexibility and strengthening excercises, supervised PT with diminished ADL's post treatment, use of assistive devices and activity modification.  Onset of symptoms was gradual starting 6 years ago with gradually worsening course since that time.The patient noted no past surgery on the right hip(s).  Patient currently rates pain in the right hip at 9 out of 10 with activity. Patient has night pain, worsening of pain with activity and weight bearing, trendelenberg gait, pain that interfers with activities of daily living and pain with passive range of motion. Patient has evidence of subchondral sclerosis, periarticular osteophytes and joint space narrowing by imaging studies. This condition presents safety issues increasing the risk of falls. This patient has had none.  There is no current active infection.  Patient Active Problem List   Diagnosis Date Noted  . Chronotropic incompetence with sinus node dysfunction 09/24/2010  . Pacemaker- DDD-Medtronic 09/24/2010  . Elevated blood pressure 09/24/2010  . HYPERLIPIDEMIA, BORDERLINE 06/24/2010   Past Medical History  Diagnosis Date  . Hypertension   . Headache(784.0)   . Bradycardia     pacemaker - Medtronic Adapta #ADDRO1 - December 22, 2005  . Hyperlipidemia   . Depression   . Insomnia   . Chronic back pain   . Anginal pain   . Dysrhythmia     pt states I have when they check the pacemaker,  . COPD (chronic obstructive pulmonary disease)   . Pneumonia     had two times unsure of when  . Diabetes mellitus     type II controled with diet  . Pacemaker     Medtronic Dr.  Graciela Husbands   . GERD (gastroesophageal reflux disease)   . Arthritis   . Neuromuscular disorder     peripheral neuropathy BLE    Past Surgical History  Procedure Laterality Date  . Back surgery    . Hemorrhoid surgery    . Pacemaker insertion    . Insert / replace / remove pacemaker      No prescriptions prior to admission   Allergies  Allergen Reactions  . Penicillins Anaphylaxis  . Metoprolol Tartrate     HR drops too low  . Codeine Rash    History  Substance Use Topics  . Smoking status: Former Games developer  . Smokeless tobacco: Not on file  . Alcohol Use: Yes     Comment: beer occ    No family history on file.   Review of Systems  Constitutional: Negative.   HENT: Negative.   Eyes: Negative.   Respiratory: Negative.   Cardiovascular: Positive for palpitations.       Pacemaker  Gastrointestinal: Negative.   Genitourinary: Negative.   Musculoskeletal: Positive for joint pain.  Skin: Negative.   Neurological: Negative.   Endo/Heme/Allergies: Negative.   Psychiatric/Behavioral: Negative.     Objective:  Physical Exam  Constitutional: He appears well-nourished.  HENT:  Head: Normocephalic.  Eyes: Pupils are equal, round, and reactive to light.  Neck: Normal range of motion.  Cardiovascular: Normal rate.   Respiratory: Effort normal.  GI: Bowel sounds are normal.  Musculoskeletal:  Right hip exam: Motion very limited and painful with any rotation.  Leg lengths roughly equal.  Walks with an antalgic gait.  Well-healed lumbar incision from previous surgery.  Skin: Skin is warm.  Psychiatric: He has a normal mood and affect.    Vital signs in last 24 hours:    Labs:   Estimated body mass index is 29.03 kg/(m^2) as calculated from the following:   Height as of 09/29/12: 6' (1.829 m).   Weight as of 09/29/12: 97.124 kg (214 lb 1.9 oz).   Imaging Review Plain radiographs demonstrate severe degenerative joint disease of the right hip(s). The bone quality appears  to be good for age and reported activity level.  Assessment/Plan:  End stage arthritis, right hip(s)  The patient history, physical examination, clinical judgement of the provider and imaging studies are consistent with end stage degenerative joint disease of the right hip(s) and total hip arthroplasty is deemed medically necessary. The treatment options including medical management, injection therapy, arthroscopy and arthroplasty were discussed at length. The risks and benefits of total hip arthroplasty were presented and reviewed. The risks due to aseptic loosening, infection, stiffness, dislocation/subluxation,  thromboembolic complications and other imponderables were discussed.  The patient acknowledged the explanation, agreed to proceed with the plan and consent was signed. Patient is being admitted for inpatient treatment for surgery, pain control, PT, OT, prophylactic antibiotics, VTE prophylaxis, progressive ambulation and ADL's and discharge planning.The patient is planning to be discharged home with home health services

## 2013-01-20 ENCOUNTER — Encounter: Payer: Self-pay | Admitting: Physician Assistant

## 2013-01-20 ENCOUNTER — Ambulatory Visit (INDEPENDENT_AMBULATORY_CARE_PROVIDER_SITE_OTHER)
Admission: RE | Admit: 2013-01-20 | Discharge: 2013-01-20 | Disposition: A | Discharge status: Home / Self Care | Payer: Medicare Other | Source: Ambulatory Visit | Attending: Physician Assistant | Admitting: Physician Assistant

## 2013-01-20 ENCOUNTER — Telehealth: Payer: Self-pay | Admitting: *Deleted

## 2013-01-20 ENCOUNTER — Ambulatory Visit (INDEPENDENT_AMBULATORY_CARE_PROVIDER_SITE_OTHER): Payer: Medicare Other | Admitting: Physician Assistant

## 2013-01-20 VITALS — BP 124/82 | HR 64 | Ht 72.0 in | Wt 218.0 lb

## 2013-01-20 DIAGNOSIS — R079 Chest pain, unspecified: Secondary | ICD-10-CM

## 2013-01-20 DIAGNOSIS — R911 Solitary pulmonary nodule: Secondary | ICD-10-CM

## 2013-01-20 DIAGNOSIS — E785 Hyperlipidemia, unspecified: Secondary | ICD-10-CM

## 2013-01-20 DIAGNOSIS — I10 Essential (primary) hypertension: Secondary | ICD-10-CM

## 2013-01-20 DIAGNOSIS — Z01810 Encounter for preprocedural cardiovascular examination: Secondary | ICD-10-CM

## 2013-01-20 DIAGNOSIS — Z950 Presence of cardiac pacemaker: Secondary | ICD-10-CM

## 2013-01-20 NOTE — Progress Notes (Signed)
[address] [place], [place]  [address] Phone: [phone] Fax:  [phone]  Date:  01/20/2013   ID:  [name] [name], [name] [name] 11/13/1945, MRN [id number]  PCP:  [name], MD  Cardiologist:  Dr. Sherryl Manges     History of Present Illness: [name] [name] is a 67 y.o. male who returns for surgical clearance.    Has a history of sinus node dysfunction and chronotropic incompetence, status post pacemaker implantation, DM2, HTN, HL, COPD.  Last seen by Dr. Graciela Husbands 09/29/12.  Patient has been scheduled for right THR on 01/25/13 with Dr. Jerl Santos.   He is limited by his hip pain.  He likely cannot achieve 4 METs.  He has occasional CP.  This is atypical without assoc symptoms.  He notes some DOE.  He is NYHA Class II-IIb.  No orthopnea, PND, edema.  No syncope.     Labs (9/14):  K 4.1, Cr 1.12, Hgb 14.3 CXR (9/14):  Lingular density increased from prior; consider repeat CXR vs CT  Wt Readings from Last 3 Encounters:  01/20/13 218 lb (98.884 kg)  01/17/13 219 lb 11.2 oz (99.655 kg)  09/29/12 214 lb 1.9 oz (97.124 kg)     Past Medical History  Diagnosis Date  . Hypertension   . Headache(784.0)   . Bradycardia     pacemaker - Medtronic Adapta #ADDRO1 - December 22, 2005  . Hyperlipidemia   . Depression   . Insomnia   . Chronic back pain   . Anginal pain   . Dysrhythmia     pt states I have when they check the pacemaker,  . COPD (chronic obstructive pulmonary disease)   . Pneumonia     had two times unsure of when  . Diabetes mellitus     type II controled with diet  . Pacemaker     Medtronic Dr. Graciela Husbands   . GERD (gastroesophageal reflux disease)   . Arthritis   . Neuromuscular disorder     peripheral neuropathy BLE    Past Surgical History  Procedure Laterality Date  . Back surgery    . Hemorrhoid surgery    . Pacemaker insertion    . Insert / replace / remove pacemaker       Current Outpatient Prescriptions  Medication Sig Dispense Refill  .  albuterol (PROVENTIL HFA;VENTOLIN HFA) 108 (90 BASE) MCG/ACT inhaler Inhale 2 puffs into the lungs 2 (two) times daily as needed.       [name] aspirin 81 MG tablet Take 81 mg by mouth daily.        [name] atorvastatin (LIPITOR) 40 MG tablet Take 40 mg by mouth daily.      . B Complex Vitamins (B COMPLEX-B12) TABS Take 1 tablet by mouth daily.       . budesonide-formoterol (SYMBICORT) 160-4.5 MCG/ACT inhaler Inhale 2 puffs into the lungs 2 (two) times daily.      [name] docusate sodium (COLACE) 100 MG capsule Take 100 mg by mouth daily.      [name] esomeprazole (NEXIUM) 40 MG capsule Take 40 mg by mouth daily before breakfast.      . gabapentin (NEURONTIN) 100 MG capsule Take 100 mg by mouth 4 (four) times daily as needed (for pain).      [name] losartan (COZAAR) 50 MG tablet Take 50 mg by mouth daily.      . Magnesium 250 MG TABS Take 1 tablet by mouth daily.      . Omega 3  1200 MG CAPS Take 1 capsule by mouth daily.      [name] OVER THE COUNTER MEDICATION Take 1-2 tablets by mouth daily. [employer] brand of allergy relief      . triamterene-hydrochlorothiazide (MAXZIDE-25) 37.5-25 MG per tablet Take 1 tablet by mouth daily.  90 tablet  3  . vitamin C (ASCORBIC ACID) 500 MG tablet Take 500 mg by mouth daily.      . vitamin E 400 UNIT capsule Take 400 Units by mouth daily.         No current facility-administered medications for this visit.    Allergies:    Allergies  Allergen Reactions  . Penicillins Anaphylaxis  . Metoprolol Tartrate     HR drops too low  . Codeine Rash    Social History:  The patient  reports that he has quit smoking. He does not have any smokeless tobacco history on file. He reports that  drinks alcohol. He reports that he does not use illicit drugs.   Family History:  The patient's family history includes Heart disease in his mother.   ROS:  Please see the history of present illness.   He has a chronic cough.   All other systems reviewed and negative.   PHYSICAL EXAM: VS:  BP 124/82  Pulse  64  Ht 6' (1.829 m)  Wt 218 lb (98.884 kg)  BMI 29.56 kg/m2  SpO2 97% Well nourished, well developed, in no acute distress HEENT: normal Neck: no JVD Vascular:  No carotid bruits Cardiac:  normal S1, S2; RRR; no murmur Lungs:  clear to auscultation bilaterally, no wheezing, rhonchi or rales Abd: soft, nontender, no hepatomegaly Ext: no edema Skin: warm and dry Neuro:  CNs 2-12 intact, no focal abnormalities noted  EKG:  A-paced, NSR, HR 64, no ST changes  ASSESSMENT AND PLAN:  1. Surgical Clearance:  He has had some atypical CP.  His planned procedure is intermediate risk.  He has one clinical risk factor (diabetes mellitus).  He is likely not able to achieve 4 METs.  I recommend he undergo stress testing prior to proceeding with his planned hip surgery.  We will arrange for this to be done ASAP to hopefully avoid postponing or delaying his surgery.   2. Lung Nodule:  He has a lingular density that has increased in size.  Will also arrange Chest CT to further define this area as he is a prior smoker (30 pk year hx). 3. Hypertension:  Controlled.  Continue current therapy.  4. Hyperlipidemia:  Continue statin. 5. COPD:  F/u with PCP as planned. 6. S/p Pacemaker:  F/u with Dr. Sherryl Manges as planned. 7. Disposition:  F/u with Dr. Sherryl Manges as planned.   Signed, Tereso Newcomer, PA-C  01/20/2013 2:18 PM

## 2013-01-20 NOTE — Patient Instructions (Addendum)
NO CHANGES WERE MADE WITH YOUR MEDICATIONS TODAY  YOU WILL HAVE A CHEST CT W/O CONTRAST TODAY; DX LINGULAR DENSITY; LUNG NODULE  YOU WILL NEED TO BE SCHEDULED FOR A STRESS MYOVIEW FIRST THING Monday MORNING PER SCOTT WEAVER, PAC; SO THAT WE MAY HAVE TIME TO GET THE REPORT TO THE SURGEON

## 2013-01-20 NOTE — Telephone Encounter (Signed)
lmom CT results no significant lung nodule; ok for his surgery.

## 2013-01-21 ENCOUNTER — Ambulatory Visit (INDEPENDENT_AMBULATORY_CARE_PROVIDER_SITE_OTHER): Payer: Medicare Other | Admitting: *Deleted

## 2013-01-21 DIAGNOSIS — I495 Sick sinus syndrome: Secondary | ICD-10-CM

## 2013-01-21 DIAGNOSIS — Z950 Presence of cardiac pacemaker: Secondary | ICD-10-CM

## 2013-01-24 ENCOUNTER — Telehealth: Payer: Self-pay | Admitting: Internal Medicine

## 2013-01-24 ENCOUNTER — Ambulatory Visit (HOSPITAL_BASED_OUTPATIENT_CLINIC_OR_DEPARTMENT_OTHER): Payer: Medicare Other | Admitting: Radiology

## 2013-01-24 VITALS — BP 135/88 | Ht 72.0 in | Wt 219.0 lb

## 2013-01-24 DIAGNOSIS — R0609 Other forms of dyspnea: Secondary | ICD-10-CM

## 2013-01-24 DIAGNOSIS — I10 Essential (primary) hypertension: Secondary | ICD-10-CM | POA: Insufficient documentation

## 2013-01-24 DIAGNOSIS — J4489 Other specified chronic obstructive pulmonary disease: Secondary | ICD-10-CM | POA: Insufficient documentation

## 2013-01-24 DIAGNOSIS — J449 Chronic obstructive pulmonary disease, unspecified: Secondary | ICD-10-CM | POA: Insufficient documentation

## 2013-01-24 DIAGNOSIS — Z950 Presence of cardiac pacemaker: Secondary | ICD-10-CM | POA: Insufficient documentation

## 2013-01-24 DIAGNOSIS — R0989 Other specified symptoms and signs involving the circulatory and respiratory systems: Secondary | ICD-10-CM | POA: Insufficient documentation

## 2013-01-24 DIAGNOSIS — E119 Type 2 diabetes mellitus without complications: Secondary | ICD-10-CM | POA: Insufficient documentation

## 2013-01-24 DIAGNOSIS — R079 Chest pain, unspecified: Secondary | ICD-10-CM

## 2013-01-24 DIAGNOSIS — Z87891 Personal history of nicotine dependence: Secondary | ICD-10-CM | POA: Insufficient documentation

## 2013-01-24 DIAGNOSIS — Z01810 Encounter for preprocedural cardiovascular examination: Secondary | ICD-10-CM

## 2013-01-24 DIAGNOSIS — R002 Palpitations: Secondary | ICD-10-CM | POA: Insufficient documentation

## 2013-01-24 MED ORDER — TECHNETIUM TC 99M SESTAMIBI GENERIC - CARDIOLITE
33.0000 | Freq: Once | INTRAVENOUS | Status: AC | PRN
  Administered 2013-01-24: 33 via INTRAVENOUS

## 2013-01-24 MED ORDER — REGADENOSON 0.4 MG/5ML IV SOLN
0.4000 mg | Freq: Once | INTRAVENOUS | Status: AC
  Administered 2013-01-24: 0.4 mg via INTRAVENOUS

## 2013-01-24 MED ORDER — TECHNETIUM TC 99M SESTAMIBI GENERIC - CARDIOLITE
11.0000 | Freq: Once | INTRAVENOUS | Status: AC | PRN
  Administered 2013-01-24: 11 via INTRAVENOUS

## 2013-01-24 NOTE — Progress Notes (Signed)
[facility] SITE 3 NUCLEAR MED [address] [place], [place] [address] [phone]    Cardiology Nuclear Med Study  [name] [name] is a 67 y.o. 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 IV 0.9% NS with Angio Cath:  22g  IV Site: L Antecubital x 1, tolerated well IV Started by:  Irean Hong, RN  Chest Size (in):  44-46 Cup Size: n/a  Height: 6' (1.829 m)  Weight:  219 lb (99.338 kg)  BMI:  Body mass index is 29.7 kg/(m^2). Tech Comments:  Cozaar this am    Nuclear Med Study 1 or 2 day study: 1 day  Stress Test Type:  Lexiscan  Reading MD: Marca Ancona, MD  Order Authorizing Provider:  Sherryl Manges, MD  Resting Radionuclide: Technetium 92m Sestamibi  Resting Radionuclide Dose: 11.0 mCi   Stress Radionuclide:  Technetium 11m Sestamibi  Stress Radionuclide Dose: 33.0 mCi           Stress Protocol Rest HR: 60 Stress HR: 69  Rest BP: 135/88 Stress BP: 143/72  Exercise Time (min): n/a METS: n/a   Predicted Max HR: 151 bpm % Max HR: 45.7 bpm Rate Pressure Product: 9867   Dose of Adenosine (mg):  n/a Dose of Lexiscan: 0.4 mg  Dose of Atropine (mg): n/a Dose of Dobutamine: n/a mcg/kg/min (at max HR)  Stress Test Technologist: Milana Na, EMT-P  Nuclear Technologist:  Doyne Keel, CNMT     Rest Procedure:  Myocardial perfusion imaging was performed at rest 45 minutes following the intravenous administration of Technetium 59m Sestamibi. Rest ECG: NSR - Normal EKG  Stress Procedure:  The patient received IV  Lexiscan 0.4 mg over 15-seconds.  Technetium 46m Sestamibi injected at 30-seconds.  Quantitative spect images were obtained after a 45 minute delay. Stress ECG: No significant change from baseline ECG  QPS Raw Data Images:  Normal; no motion artifact; normal heart/lung ratio. Stress Images:  Small, mild basal to mid inferior perfusion defect. Rest Images:  Small, mild basal to mid inferior perfusion defect. Subtraction (SDS):  Small, mild fixed basal to mid inferior perfusion defect. Transient Ischemic Dilatation (Normal <1.22):  n/a Lung/Heart Ratio (Normal <0.45):  0.48  Quantitative Gated Spect Images QGS EDV:  89 ml QGS ESV:  39 ml  Impression Exercise Capacity:  Lexiscan with no exercise. BP Response:  Normal blood pressure response. Clinical Symptoms:  Shortness of breath ECG Impression:  No significant ST segment change suggestive of ischemia. Comparison with Prior Nuclear Study: No images to compare  Overall Impression:  Low risk stress nuclear study with a small, mild basal to mid inferior perfusion defect.  No ischemia.  Given normal wall motion, suspect diaphragmatic attenuation. .  LV Ejection Fraction: 57.  LV Wall Motion:  NL LV Function; NL Wall Motion  Marca Ancona 01/24/2013

## 2013-01-24 NOTE — Telephone Encounter (Signed)
Dr Shirlee Latch reviewed myoview done today--low risk for THR. Pt advised. Spoke with answering service for Dr Jerl Santos to let him know pt low risk for THR. They were going to page Dr Jerl Santos to call me back.    Othelia Pulling for Dr Jerl Santos aware pt low risk for THR.

## 2013-01-25 ENCOUNTER — Inpatient Hospital Stay (HOSPITAL_COMMUNITY)
Admission: RE | Admit: 2013-01-25 | Discharge: 2013-01-27 | DRG: 470 | Disposition: A | Discharge status: Home Health | Payer: Medicare Other | Source: Ambulatory Visit | Attending: Orthopaedic Surgery | Admitting: Orthopaedic Surgery

## 2013-01-25 ENCOUNTER — Inpatient Hospital Stay (HOSPITAL_COMMUNITY): Payer: Medicare Other

## 2013-01-25 ENCOUNTER — Encounter (HOSPITAL_COMMUNITY): Payer: Self-pay | Admitting: Vascular Surgery

## 2013-01-25 ENCOUNTER — Encounter (HOSPITAL_COMMUNITY): Payer: Self-pay | Admitting: *Deleted

## 2013-01-25 ENCOUNTER — Inpatient Hospital Stay (HOSPITAL_COMMUNITY): Payer: Medicare Other | Admitting: Anesthesiology

## 2013-01-25 ENCOUNTER — Encounter (HOSPITAL_COMMUNITY)
Admission: RE | Disposition: A | Discharge status: Home Health | Payer: Self-pay | Source: Ambulatory Visit | Attending: Orthopaedic Surgery

## 2013-01-25 DIAGNOSIS — G609 Hereditary and idiopathic neuropathy, unspecified: Secondary | ICD-10-CM | POA: Diagnosis present

## 2013-01-25 DIAGNOSIS — E119 Type 2 diabetes mellitus without complications: Secondary | ICD-10-CM | POA: Diagnosis present

## 2013-01-25 DIAGNOSIS — G8929 Other chronic pain: Secondary | ICD-10-CM | POA: Diagnosis present

## 2013-01-25 DIAGNOSIS — E785 Hyperlipidemia, unspecified: Secondary | ICD-10-CM | POA: Diagnosis present

## 2013-01-25 DIAGNOSIS — J449 Chronic obstructive pulmonary disease, unspecified: Secondary | ICD-10-CM | POA: Diagnosis present

## 2013-01-25 DIAGNOSIS — Z950 Presence of cardiac pacemaker: Secondary | ICD-10-CM

## 2013-01-25 DIAGNOSIS — J4489 Other specified chronic obstructive pulmonary disease: Secondary | ICD-10-CM | POA: Diagnosis present

## 2013-01-25 DIAGNOSIS — M1610 Unilateral primary osteoarthritis, unspecified hip: Principal | ICD-10-CM | POA: Diagnosis present

## 2013-01-25 DIAGNOSIS — M169 Osteoarthritis of hip, unspecified: Principal | ICD-10-CM | POA: Diagnosis present

## 2013-01-25 DIAGNOSIS — F3289 Other specified depressive episodes: Secondary | ICD-10-CM | POA: Diagnosis present

## 2013-01-25 DIAGNOSIS — K219 Gastro-esophageal reflux disease without esophagitis: Secondary | ICD-10-CM | POA: Diagnosis present

## 2013-01-25 DIAGNOSIS — F329 Major depressive disorder, single episode, unspecified: Secondary | ICD-10-CM | POA: Diagnosis present

## 2013-01-25 DIAGNOSIS — I10 Essential (primary) hypertension: Secondary | ICD-10-CM | POA: Diagnosis present

## 2013-01-25 DIAGNOSIS — Z87891 Personal history of nicotine dependence: Secondary | ICD-10-CM

## 2013-01-25 DIAGNOSIS — M549 Dorsalgia, unspecified: Secondary | ICD-10-CM | POA: Diagnosis present

## 2013-01-25 HISTORY — PX: TOTAL HIP ARTHROPLASTY: SHX124

## 2013-01-25 LAB — GLUCOSE, CAPILLARY
Glucose-Capillary: 145 mg/dL — ABNORMAL HIGH (ref 70–99)
Glucose-Capillary: 107 mg/dL — ABNORMAL HIGH (ref 70–99)

## 2013-01-25 SURGERY — ARTHROPLASTY, HIP, TOTAL, ANTERIOR APPROACH
Anesthesia: General | Site: Hip | Laterality: Right | Wound class: Clean

## 2013-01-25 MED ORDER — TRIAMTERENE-HCTZ 37.5-25 MG PO TABS
1.0000 | ORAL_TABLET | Freq: Every day | ORAL | Status: DC
  Administered 2013-01-26 – 2013-01-27 (×2): 1 via ORAL
  Filled 2013-01-25 (×2): qty 1

## 2013-01-25 MED ORDER — LOSARTAN POTASSIUM 50 MG PO TABS
50.0000 mg | ORAL_TABLET | Freq: Every day | ORAL | Status: DC
  Administered 2013-01-26 – 2013-01-27 (×2): 50 mg via ORAL
  Filled 2013-01-25 (×2): qty 1

## 2013-01-25 MED ORDER — ACETAMINOPHEN 325 MG PO TABS
650.0000 mg | ORAL_TABLET | Freq: Four times a day (QID) | ORAL | Status: DC | PRN
  Administered 2013-01-26 (×2): 650 mg via ORAL
  Filled 2013-01-25 (×2): qty 2

## 2013-01-25 MED ORDER — FERROUS SULFATE 325 (65 FE) MG PO TABS
325.0000 mg | ORAL_TABLET | Freq: Every day | ORAL | Status: DC
  Administered 2013-01-26: 325 mg via ORAL
  Filled 2013-01-25 (×3): qty 1

## 2013-01-25 MED ORDER — HYDROMORPHONE HCL PF 1 MG/ML IJ SOLN
INTRAMUSCULAR | Status: AC
  Administered 2013-01-25: 11:00:00
  Filled 2013-01-25: qty 1

## 2013-01-25 MED ORDER — LACTATED RINGERS IV SOLN
INTRAVENOUS | Status: DC
  Administered 2013-01-26: 04:00:00 via INTRAVENOUS

## 2013-01-25 MED ORDER — METHOCARBAMOL 500 MG PO TABS
500.0000 mg | ORAL_TABLET | Freq: Four times a day (QID) | ORAL | Status: DC | PRN
  Administered 2013-01-25 – 2013-01-26 (×4): 500 mg via ORAL
  Filled 2013-01-25 (×3): qty 1

## 2013-01-25 MED ORDER — METHOCARBAMOL 100 MG/ML IJ SOLN
500.0000 mg | Freq: Four times a day (QID) | INTRAMUSCULAR | Status: DC | PRN
  Filled 2013-01-25: qty 5

## 2013-01-25 MED ORDER — ATORVASTATIN CALCIUM 40 MG PO TABS
40.0000 mg | ORAL_TABLET | Freq: Every day | ORAL | Status: DC
  Administered 2013-01-26 – 2013-01-27 (×2): 40 mg via ORAL
  Filled 2013-01-25 (×2): qty 1

## 2013-01-25 MED ORDER — SODIUM CHLORIDE 0.9 % IV SOLN
1000.0000 mg | INTRAVENOUS | Status: AC
  Administered 2013-01-25: 1000 mg via INTRAVENOUS
  Filled 2013-01-25: qty 10

## 2013-01-25 MED ORDER — NEOSTIGMINE METHYLSULFATE 1 MG/ML IJ SOLN
INTRAMUSCULAR | Status: DC | PRN
  Administered 2013-01-25: 3 mg via INTRAVENOUS

## 2013-01-25 MED ORDER — ALUM & MAG HYDROXIDE-SIMETH 200-200-20 MG/5ML PO SUSP: 30.0000 mL | ORAL | Status: DC | PRN

## 2013-01-25 MED ORDER — ARTIFICIAL TEARS OP OINT
TOPICAL_OINTMENT | OPHTHALMIC | Status: DC | PRN
  Administered 2013-01-25: 1 via OPHTHALMIC

## 2013-01-25 MED ORDER — DOCUSATE SODIUM 100 MG PO CAPS
100.0000 mg | ORAL_CAPSULE | Freq: Every day | ORAL | Status: DC
  Administered 2013-01-26 – 2013-01-27 (×2): 100 mg via ORAL
  Filled 2013-01-25 (×2): qty 1

## 2013-01-25 MED ORDER — HYDROMORPHONE HCL PF 1 MG/ML IJ SOLN: 0.5000 mg | INTRAMUSCULAR | Status: DC | PRN

## 2013-01-25 MED ORDER — MAGNESIUM OXIDE 400 (241.3 MG) MG PO TABS
200.0000 mg | ORAL_TABLET | Freq: Every day | ORAL | Status: DC
  Administered 2013-01-26 – 2013-01-27 (×2): 200 mg via ORAL
  Filled 2013-01-25 (×2): qty 0.5

## 2013-01-25 MED ORDER — BISACODYL 10 MG RE SUPP: 10.0000 mg | Freq: Every day | RECTAL | Status: DC | PRN

## 2013-01-25 MED ORDER — ONDANSETRON HCL 4 MG/2ML IJ SOLN: 4.0000 mg | Freq: Four times a day (QID) | INTRAMUSCULAR | Status: DC | PRN

## 2013-01-25 MED ORDER — METOCLOPRAMIDE HCL 5 MG/ML IJ SOLN: 5.0000 mg | Freq: Three times a day (TID) | INTRAMUSCULAR | Status: DC | PRN

## 2013-01-25 MED ORDER — ONDANSETRON HCL 4 MG/2ML IJ SOLN
INTRAMUSCULAR | Status: DC | PRN
  Administered 2013-01-25: 4 mg via INTRAVENOUS

## 2013-01-25 MED ORDER — ALBUTEROL SULFATE HFA 108 (90 BASE) MCG/ACT IN AERS
2.0000 | INHALATION_SPRAY | Freq: Two times a day (BID) | RESPIRATORY_TRACT | Status: DC | PRN
  Administered 2013-01-25: 2 via RESPIRATORY_TRACT
  Filled 2013-01-25: qty 6.7

## 2013-01-25 MED ORDER — MAGNESIUM 250 MG PO TABS: 1.0000 | ORAL_TABLET | Freq: Every day | ORAL | Status: DC

## 2013-01-25 MED ORDER — ONDANSETRON HCL 4 MG/2ML IJ SOLN: 4.0000 mg | Freq: Once | INTRAMUSCULAR | Status: DC | PRN

## 2013-01-25 MED ORDER — ONDANSETRON HCL 4 MG PO TABS: 4.0000 mg | ORAL_TABLET | Freq: Four times a day (QID) | ORAL | Status: DC | PRN

## 2013-01-25 MED ORDER — VANCOMYCIN HCL IN DEXTROSE 1-5 GM/200ML-% IV SOLN
1000.0000 mg | Freq: Two times a day (BID) | INTRAVENOUS | Status: AC
  Administered 2013-01-25: 1000 mg via INTRAVENOUS
  Filled 2013-01-25: qty 200

## 2013-01-25 MED ORDER — GLYCOPYRROLATE 0.2 MG/ML IJ SOLN
INTRAMUSCULAR | Status: DC | PRN
  Administered 2013-01-25: 0.4 mg via INTRAVENOUS

## 2013-01-25 MED ORDER — OXYCODONE HCL 5 MG PO TABS
ORAL_TABLET | ORAL | Status: AC
  Administered 2013-01-25: 13:00:00
  Filled 2013-01-25: qty 1

## 2013-01-25 MED ORDER — PANTOPRAZOLE SODIUM 40 MG PO TBEC
80.0000 mg | DELAYED_RELEASE_TABLET | Freq: Every day | ORAL | Status: DC
  Administered 2013-01-26: 80 mg via ORAL
  Filled 2013-01-25: qty 2

## 2013-01-25 MED ORDER — BUDESONIDE-FORMOTEROL FUMARATE 160-4.5 MCG/ACT IN AERO
2.0000 | INHALATION_SPRAY | Freq: Two times a day (BID) | RESPIRATORY_TRACT | Status: DC
  Administered 2013-01-25 – 2013-01-27 (×4): 2 via RESPIRATORY_TRACT
  Filled 2013-01-25 (×2): qty 6

## 2013-01-25 MED ORDER — FENTANYL CITRATE 0.05 MG/ML IJ SOLN
INTRAMUSCULAR | Status: DC | PRN
  Administered 2013-01-25: 50 ug via INTRAVENOUS
  Administered 2013-01-25: 100 ug via INTRAVENOUS
  Administered 2013-01-25 (×4): 50 ug via INTRAVENOUS
  Administered 2013-01-25: 100 ug via INTRAVENOUS
  Administered 2013-01-25: 50 ug via INTRAVENOUS

## 2013-01-25 MED ORDER — PROPOFOL 10 MG/ML IV BOLUS
INTRAVENOUS | Status: DC | PRN
  Administered 2013-01-25: 100 mg via INTRAVENOUS

## 2013-01-25 MED ORDER — ACETAMINOPHEN 650 MG RE SUPP: 650.0000 mg | Freq: Four times a day (QID) | RECTAL | Status: DC | PRN

## 2013-01-25 MED ORDER — MENTHOL 3 MG MT LOZG: 1.0000 | LOZENGE | OROMUCOSAL | Status: DC | PRN

## 2013-01-25 MED ORDER — PHENOL 1.4 % MT LIQD: 1.0000 | OROMUCOSAL | Status: DC | PRN

## 2013-01-25 MED ORDER — OXYCODONE HCL 5 MG/5ML PO SOLN: 5.0000 mg | Freq: Once | ORAL | Status: DC | PRN

## 2013-01-25 MED ORDER — LACTATED RINGERS IV SOLN
INTRAVENOUS | Status: DC
  Administered 2013-01-25 (×3): via INTRAVENOUS

## 2013-01-25 MED ORDER — ROCURONIUM BROMIDE 100 MG/10ML IV SOLN
INTRAVENOUS | Status: DC | PRN
  Administered 2013-01-25: 10 mg via INTRAVENOUS
  Administered 2013-01-25: 50 mg via INTRAVENOUS

## 2013-01-25 MED ORDER — HYDROMORPHONE HCL PF 1 MG/ML IJ SOLN
0.2500 mg | INTRAMUSCULAR | Status: DC | PRN
  Administered 2013-01-25 (×4): 0.5 mg via INTRAVENOUS

## 2013-01-25 MED ORDER — ASPIRIN EC 325 MG PO TBEC
325.0000 mg | DELAYED_RELEASE_TABLET | Freq: Two times a day (BID) | ORAL | Status: DC
  Administered 2013-01-26 – 2013-01-27 (×3): 325 mg via ORAL
  Filled 2013-01-25 (×5): qty 1

## 2013-01-25 MED ORDER — 0.9 % SODIUM CHLORIDE (POUR BTL) OPTIME
TOPICAL | Status: DC | PRN
  Administered 2013-01-25: 1000 mL

## 2013-01-25 MED ORDER — METOCLOPRAMIDE HCL 5 MG PO TABS
5.0000 mg | ORAL_TABLET | Freq: Three times a day (TID) | ORAL | Status: DC | PRN
  Filled 2013-01-25: qty 2

## 2013-01-25 MED ORDER — OXYCODONE HCL 5 MG PO TABS
5.0000 mg | ORAL_TABLET | ORAL | Status: DC | PRN
  Administered 2013-01-25 (×4): 5 mg via ORAL
  Administered 2013-01-26 – 2013-01-27 (×5): 10 mg via ORAL
  Filled 2013-01-25: qty 1
  Filled 2013-01-25: qty 2
  Filled 2013-01-25: qty 1
  Filled 2013-01-25 (×2): qty 2
  Filled 2013-01-25: qty 1
  Filled 2013-01-25 (×2): qty 2

## 2013-01-25 MED ORDER — METHOCARBAMOL 500 MG PO TABS
ORAL_TABLET | ORAL | Status: AC
  Administered 2013-01-25: 14:00:00
  Filled 2013-01-25: qty 1

## 2013-01-25 MED ORDER — MIDAZOLAM HCL 5 MG/5ML IJ SOLN
INTRAMUSCULAR | Status: DC | PRN
  Administered 2013-01-25: 2 mg via INTRAVENOUS

## 2013-01-25 MED ORDER — VANCOMYCIN HCL IN DEXTROSE 1-5 GM/200ML-% IV SOLN
INTRAVENOUS | Status: AC
  Administered 2013-01-25: 1000 mg via INTRAVENOUS
  Filled 2013-01-25: qty 200

## 2013-01-25 MED ORDER — LIDOCAINE HCL (CARDIAC) 20 MG/ML IV SOLN
INTRAVENOUS | Status: DC | PRN
  Administered 2013-01-25: 100 mg via INTRAVENOUS

## 2013-01-25 MED ORDER — GABAPENTIN 100 MG PO CAPS
100.0000 mg | ORAL_CAPSULE | Freq: Four times a day (QID) | ORAL | Status: DC | PRN
Start: 2013-01-25 — End: 2013-01-27
  Filled 2013-01-25: qty 1

## 2013-01-25 MED ORDER — OXYCODONE HCL 5 MG PO TABS: 5.0000 mg | ORAL_TABLET | Freq: Once | ORAL | Status: DC | PRN

## 2013-01-25 MED ORDER — MEPERIDINE HCL 25 MG/ML IJ SOLN: 6.2500 mg | INTRAMUSCULAR | Status: DC | PRN

## 2013-01-25 SURGICAL SUPPLY — 53 items
BLADE SAW SGTL 18X1.27X75 (BLADE) ×2 IMPLANT
BLADE SURG ROTATE 9660 (MISCELLANEOUS) IMPLANT
CAPT HIP PF COP ×1 IMPLANT
CELLS DAT CNTRL 66122 CELL SVR (MISCELLANEOUS) ×1 IMPLANT
CLOTH BEACON ORANGE TIMEOUT ST (SAFETY) ×2 IMPLANT
COVER BACK TABLE 24X17X13 BIG (DRAPES) IMPLANT
COVER SURGICAL LIGHT HANDLE (MISCELLANEOUS) ×2 IMPLANT
DRAPE C-ARM 42X72 X-RAY (DRAPES) ×2 IMPLANT
DRAPE STERI IOBAN 125X83 (DRAPES) ×2 IMPLANT
DRAPE U-SHAPE 47X51 STRL (DRAPES) ×6 IMPLANT
DRESSING AQUACEL AQ EXTRA 4X5 (GAUZE/BANDAGES/DRESSINGS) ×2 IMPLANT
DRSG AQUACEL AG ADV 3.5X10 (GAUZE/BANDAGES/DRESSINGS) ×2 IMPLANT
DURAPREP 26ML APPLICATOR (WOUND CARE) ×2 IMPLANT
ELECT BLADE 4.0 EZ CLEAN MEGAD (MISCELLANEOUS) ×2
ELECT BLADE TIP CTD 4 INCH (ELECTRODE) ×2 IMPLANT
ELECT CAUTERY BLADE 6.4 (BLADE) ×2 IMPLANT
ELECT REM PT RETURN 9FT ADLT (ELECTROSURGICAL) ×2
ELECTRODE BLDE 4.0 EZ CLN MEGD (MISCELLANEOUS) IMPLANT
ELECTRODE REM PT RTRN 9FT ADLT (ELECTROSURGICAL) ×1 IMPLANT
FACESHIELD LNG OPTICON STERILE (SAFETY) ×4 IMPLANT
GAUZE XEROFORM 1X8 LF (GAUZE/BANDAGES/DRESSINGS) ×2 IMPLANT
GLOVE BIO SURGEON STRL SZ8 (GLOVE) ×2 IMPLANT
GLOVE BIO SURGEON STRL SZ8.5 (GLOVE) ×2 IMPLANT
GLOVE BIOGEL PI IND STRL 8 (GLOVE) ×1 IMPLANT
GLOVE BIOGEL PI IND STRL 8.5 (GLOVE) ×1 IMPLANT
GLOVE BIOGEL PI INDICATOR 8 (GLOVE) ×1
GLOVE BIOGEL PI INDICATOR 8.5 (GLOVE) ×1
GOWN PREVENTION PLUS LG XLONG (DISPOSABLE) ×1 IMPLANT
GOWN STRL NON-REIN LRG LVL3 (GOWN DISPOSABLE) ×4 IMPLANT
GOWN STRL REIN XL XLG (GOWN DISPOSABLE) ×2 IMPLANT
KIT BASIN OR (CUSTOM PROCEDURE TRAY) ×2 IMPLANT
KIT ROOM TURNOVER OR (KITS) ×2 IMPLANT
MANIFOLD NEPTUNE II (INSTRUMENTS) ×2 IMPLANT
NS IRRIG 1000ML POUR BTL (IV SOLUTION) ×2 IMPLANT
PACK TOTAL JOINT (CUSTOM PROCEDURE TRAY) ×2 IMPLANT
PAD ARMBOARD 7.5X6 YLW CONV (MISCELLANEOUS) ×4 IMPLANT
RETRACTOR WND ALEXIS 18 MED (MISCELLANEOUS) ×1 IMPLANT
RTRCTR WOUND ALEXIS 18CM MED (MISCELLANEOUS) ×2
SPONGE LAP 18X18 X RAY DECT (DISPOSABLE) ×2 IMPLANT
SPONGE LAP 4X18 X RAY DECT (DISPOSABLE) ×1 IMPLANT
STAPLER VISISTAT 35W (STAPLE) ×2 IMPLANT
SUT ETHIBOND NAB CT1 #1 30IN (SUTURE) ×6 IMPLANT
SUT VIC AB 0 CT1 27 (SUTURE) ×2
SUT VIC AB 0 CT1 27XBRD ANBCTR (SUTURE) IMPLANT
SUT VIC AB 1 CT1 27 (SUTURE) ×2
SUT VIC AB 1 CT1 27XBRD ANBCTR (SUTURE) ×1 IMPLANT
SUT VIC AB 2-0 CT1 27 (SUTURE) ×2
SUT VIC AB 2-0 CT1 TAPERPNT 27 (SUTURE) ×1 IMPLANT
SUT VLOC 180 0 24IN GS25 (SUTURE) ×2 IMPLANT
TOWEL OR 17X24 6PK STRL BLUE (TOWEL DISPOSABLE) ×2 IMPLANT
TOWEL OR 17X26 10 PK STRL BLUE (TOWEL DISPOSABLE) ×4 IMPLANT
TRAY FOLEY CATH 14FR (SET/KITS/TRAYS/PACK) ×1 IMPLANT
WATER STERILE IRR 1000ML POUR (IV SOLUTION) ×4 IMPLANT

## 2013-01-25 NOTE — Anesthesia Preprocedure Evaluation (Addendum)
Anesthesia Evaluation  Patient identified by MRN, date of birth, ID band Patient awake    Reviewed: Allergy & Precautions, H&P , NPO status , Patient's Chart, lab work & pertinent test results, reviewed documented beta blocker date and time   Airway Mallampati: I TM Distance: >3 FB Neck ROM: Full    Dental  (+) Edentulous Upper and Edentulous Lower   Pulmonary asthma , pneumonia -, resolved, COPD COPD inhaler, former smoker,          Cardiovascular hypertension, Pt. on medications + angina + dysrhythmias + pacemaker     Neuro/Psych  Headaches, PSYCHIATRIC DISORDERS Depression  Neuromuscular disease    GI/Hepatic GERD-  Medicated and Controlled,  Endo/Other  diabetes  Renal/GU      Musculoskeletal   Abdominal   Peds  Hematology   Anesthesia Other Findings   Reproductive/Obstetrics                       Anesthesia Physical Anesthesia Plan  ASA: III  Anesthesia Plan: General   Post-op Pain Management:    Induction: Intravenous  Airway Management Planned: Oral ETT  Additional Equipment:   Intra-op Plan:   Post-operative Plan: Extubation in OR  Informed Consent: I have reviewed the patients History and Physical, chart, labs and discussed the procedure including the risks, benefits and alternatives for the proposed anesthesia with the patient or authorized representative who has indicated his/her understanding and acceptance.     Plan Discussed with: CRNA and Surgeon  Anesthesia Plan Comments:        Anesthesia Quick Evaluation

## 2013-01-25 NOTE — Anesthesia Postprocedure Evaluation (Signed)
Anesthesia Post Note  Patient: [name] [name]  Procedure(s) Performed: Procedure(s) (LRB): TOTAL HIP ARTHROPLASTY ANTERIOR APPROACH (Right)  Anesthesia type: general  Patient location: PACU  Post pain: Pain level controlled  Post assessment: Patient's Cardiovascular Status Stable  Last Vitals:  Filed Vitals:   01/25/13 1230  BP: 151/83  Pulse: 62  Temp: 36.5 C  Resp: 12    Post vital signs: Reviewed and stable  Level of consciousness: sedated  Complications: No apparent anesthesia complications

## 2013-01-25 NOTE — Op Note (Signed)
PRE-OP DIAGNOSIS:  RIGHT HIP DEGENERATIVE JOINT DISEASE POST-OP DIAGNOSIS:  same PROCEDURE: RIGHT TOTAL HIP ARTHROPLASTY ANTERIOR APPROACH ANESTHESIA:  General SURGEON:  Marcene Corning MD ASSISTANT:  Lindwood Qua PA-C   INDICATIONS FOR PROCEDURE:  The patient is a 67 y.o. male with a long history of a painful hip.  This has persisted despite multiple conservative measures.  The patient has persisted with pain and dysfunction making rest and activity difficult.  A total hip replacement is offered as surgical treatment.  Informed operative consent was obtained after discussion of possible complications including reaction to anesthesia, infection, neurovascular injury, dislocation, DVT, PE, and death.  The importance of the postoperative rehab program to optimize result was stressed with the patient.  SUMMARY OF FINDINGS AND PROCEDURE:  Under general anesthesia through a anterior approach an the Hana table a right THR was performed.  The patient had severe degenerative change and excellent bone quality.  We used DePuy components to replace the hip and these were size KA 13 Corail femur capped with a +5 36 mm ceramic hip ball.  On the acetabular side we used a size 52 Gription shell with a  plus 4 neutral polyethylene liner.  We did not use a hole eliminator.  Bryna Colander assisted throughout and was invaluable to the completion of the case in that he helped position and retract while I performed the procedure.  He also closed simultaneously to help minimize OR time.  I used fluoroscopy throughout the case to check position of implants and leg lengths and read all of these views myself.  DESCRIPTION OF PROCEDURE:  The patient was taken to the OR suite where general anesthetic was applied.  The patient was then positioned on the Hana table supine.  All bony prominences were appropriately padded.  Prep and drape was then performed in normal sterile fashion.  The patient was given vancomycin preoperative  antibiotic and an appropriate time out was performed.  We then took an anterior approach to the right hip.  Dissection was taken through adipose to the tensor fascia lata fascia.  This structure was incised longitudinally and we dissected in the intermuscular interval just medial to this muscle.  Cobra retractors were placed superior and inferior to the femoral neck superficial to the capsule.  A capsular incision was then made and the retractors were placed along the femoral neck.  Xray was brought in to get a good level for the femoral neck cut which was made with an oscillating saw and osteotome.  The femoral head was removed with a corkscrew.  The acetabulum was exposed and some labral tissues were excised. Reaming was taken to the inside wall of the pelvis and sequentially up to 1 mm smaller than the actual component.  A trial of components was done and then the aforementioned acetabular shell was placed in appropriate tilt and anteversion confirmed by fluoroscopy. The liner was placed along with the hole eliminator and attention was turned to the femur.  The leg was brought down and over into adduction and the elevator bar was used to raise the femur up gently in the wound.  The piriformis was released with care taken to preserve the obturator internus attachment and all of the posterior capsule. The femur was reamed and then broached to the appropriate size.  A trial reduction was done and the aforementioned head and neck assembly gave [place] the best stability in extension with external rotation.  Leg lengths were felt to be about equal by fluoroscopic  exam.  The trial components were removed and the wound irrigated.  We then placed the femoral component in appropriate anteversion.  The head was applied to a dry stem neck and the hip again reduced.  It was again stable in the aforementioned position.  The would was irrigated again followed by re-approximation of anterior capsule with ethibond suture. Tensor  fascia was repaired with V-loc suture  followed by subcutaneous closure with #O and #2 undyed vicryl.  Skin was closed with staples followed by a sterile dressing.  EBL and IOF can be obtained from anesthesia records.  DISPOSITION:  The patient was extubated in the OR and taken to PACU in stable condition to be admitted to the Orthopedic Surgery for appropriate post-op care to include perioperative antibiotics and DVT prophylaxis.

## 2013-01-25 NOTE — Interval H&P Note (Signed)
History and Physical Interval Note:  01/25/2013 7:34 AM  [name] [name]  has presented today for surgery, with the diagnosis of RIGHT HIP DEGENERATIVE JOINT DISEASE  The various methods of treatment have been discussed with the patient and family. After consideration of risks, benefits and other options for treatment, the patient has consented to  Procedure(s): TOTAL HIP ARTHROPLASTY ANTERIOR APPROACH (Right) as a surgical intervention .  The patient's history has been reviewed, patient examined, no change in status, stable for surgery.  I have reviewed the patient's chart and labs.  Questions were answered to the patient's satisfaction.     [name] G

## 2013-01-25 NOTE — Transfer of Care (Signed)
Immediate Anesthesia Transfer of Care Note  Patient: [name] [name]  Procedure(s) Performed: Procedure(s): TOTAL HIP ARTHROPLASTY ANTERIOR APPROACH (Right)  Patient Location: PACU  Anesthesia Type:General  Level of Consciousness: sedated  Airway & Oxygen Therapy: Patient Spontanous Breathing and Patient connected to nasal cannula oxygen  Post-op Assessment: Report given to PACU RN and Post -op Vital signs reviewed and stable  Post vital signs: stable  Complications: No apparent anesthesia complications

## 2013-01-26 LAB — GLUCOSE, CAPILLARY: Glucose-Capillary: 122 mg/dL — ABNORMAL HIGH (ref 70–99)

## 2013-01-26 LAB — BASIC METABOLIC PANEL
BUN: 11 mg/dL (ref 6–23)
CO2: 27 mEq/L (ref 19–32)
Calcium: 8.2 mg/dL — ABNORMAL LOW (ref 8.4–10.5)
Chloride: 98 mEq/L (ref 96–112)
Creatinine, Ser: 1.14 mg/dL (ref 0.50–1.35)
GFR calc non Af Amer: 64 mL/min — ABNORMAL LOW (ref >90)
Glucose, Bld: 121 mg/dL — ABNORMAL HIGH (ref 70–99)
Sodium: 136 mEq/L (ref 135–145)

## 2013-01-26 LAB — CBC
HCT: 34.4 % — ABNORMAL LOW (ref 39.0–52.0)
Hemoglobin: 11.6 g/dL — ABNORMAL LOW (ref 13.0–17.0)
MCH: 31.5 pg (ref 26.0–34.0)
MCHC: 33.7 g/dL (ref 30.0–36.0)
MCV: 93.5 fL (ref 78.0–100.0)
RBC: 3.68 MIL/uL — ABNORMAL LOW (ref 4.22–5.81)

## 2013-01-26 MED ORDER — INFLUENZA VAC SPLIT QUAD 0.5 ML IM SUSP
0.5000 mL | INTRAMUSCULAR | Status: AC
  Administered 2013-01-27: 0.5 mL via INTRAMUSCULAR
  Filled 2013-01-26: qty 0.5

## 2013-01-26 NOTE — Progress Notes (Signed)
01/27/12 Spoke with patient about d/c, no change in plans-HHPT with [facility]. [employer] provided 3N1 and rolling walker. Jacquelynn Cree RN, BSN, CCM 01/25/2013  1500 Darlyne Russian RN, [place]  [phone] Consult to CM: DME:  RW, 3:1 Prior to admission MD office arranged HHPT with [facility].

## 2013-01-26 NOTE — Progress Notes (Signed)
Physical Therapy Treatment Patient Details Name: [name] [name] MRN: [id number] DOB: August 16, 1945 Today's Date: 01/26/2013 Time: 4098-1191 PT Time Calculation (min): 24 min  PT Assessment / Plan / Recommendation  History of Present Illness s/p Rt THA    PT Comments   Pt slowly progressing with mobility and therapy. Requires max encouragement to participate but once he begins to participate is motivated. Pt able to incr amb distance and incr with theraex this session. Will cont per POC till ready for D/C home with brother.   Follow Up Recommendations  Home health PT;Supervision/Assistance - 24 hour     Does the patient have the potential to tolerate intense rehabilitation     Barriers to Discharge        Equipment Recommendations  Rolling walker with 5" wheels;3in1 (PT)    Recommendations for Other Services    Frequency 7X/week   Progress towards PT Goals Progress towards PT goals: Progressing toward goals  Plan Current plan remains appropriate    Precautions / Restrictions Precautions Precautions: Fall Restrictions Weight Bearing Restrictions: Yes RLE Weight Bearing: Weight bearing as tolerated   Pertinent Vitals/Pain 6/10 prior to session; 5/10 at end of session. Pt premedicated by RN     Mobility  Bed Mobility Bed Mobility: Supine to Sit;Sitting - Scoot to Edge of Bed;Sit to Supine Supine to Sit: 4: Min guard;HOB flat;With rails Sitting - Scoot to Edge of Bed: 6: Modified independent (Device/Increase time);With rail Sit to Supine: 4: Min guard;5: Set up;HOB flat;With rail Details for Bed Mobility Assistance: HOB flattened to simulate home enviroment; pt relies heavily on handrails; educated on using long sheet and UEs to advance Rt LE on/off EOB; pt requires incr time and setup (A); c/o pain  Transfers Transfers: Sit to Stand;Stand to Sit Sit to Stand: 4: Min assist;From bed Stand to Sit: 4: Min guard;To bed Details for Transfer Assistance: vc's for hand placement and  to adhere to hip precautions Ambulation/Gait Ambulation/Gait Assistance: 4: Min guard Ambulation Distance (Feet): 60 Feet Assistive device: Rolling walker Ambulation/Gait Assistance Details: cues for gt sequencing; pt UEs became fatigued quickly; requires 2 standing rest breaks to perform exercises and stretch Rt LE; mod cues for upright posture Gait Pattern: Step-to pattern;Decreased stance time - right;Decreased step length - left;Trunk flexed Gait velocity: decreased due to pain Stairs: No Wheelchair Mobility Wheelchair Mobility: No    Exercises Total Joint Exercises Ankle Circles/Pumps: AROM;Both;10 reps;Supine Gluteal Sets: 10 reps Hip ABduction/ADduction: AAROM;Right;10 reps (c/o pain; required incr time) Long Arc Quad: AAROM;Right;10 reps;Seated Knee Flexion: AROM;Right;10 reps;Standing General Exercises - Lower Extremity Mini-Sqauts: 10 reps;AROM   PT Diagnosis:    PT Problem List:   PT Treatment Interventions:     PT Goals (current goals can now be found in the care plan section) Acute Rehab PT Goals Patient Stated Goal: to get moving  PT Goal Formulation: With patient Time For Goal Achievement: 02/02/13 Potential to Achieve Goals: Good  Visit Information  Last PT Received On: 01/26/13 History of Present Illness: s/p Rt THA     Subjective Data  Subjective: pt lying supine; reluctant to therapy. agreeable and asks to go to bathroom Patient Stated Goal: to get moving    Cognition  Cognition Arousal/Alertness: Awake/alert Behavior During Therapy: WFL for tasks assessed/performed Overall Cognitive Status: Within Functional Limits for tasks assessed    Balance  Balance Balance Assessed: No  End of Session PT - End of Session Equipment Utilized During Treatment: Gait belt Activity Tolerance: Patient tolerated treatment well  Patient left: in bed;with call bell/phone within reach Nurse Communication: Mobility status   GP     Donell Sievert,  [place] [phone] 01/26/2013, 3:45 PM

## 2013-01-26 NOTE — Progress Notes (Signed)
Subjective: 1 Day Post-Op Procedure(s) (LRB): TOTAL HIP ARTHROPLASTY ANTERIOR APPROACH (Right)  Activity level:  wbat Diet tolerance:  ok Voiding:  ok Patient reports pain as 3 on 0-10 scale.    Objective: Vital signs in last 24 hours: Temp:  [97.5 F (36.4 C)-99 F (37.2 C)] 99 F (37.2 C) (09/24 0524) Pulse Rate:  [59-79] 71 (09/24 0524) Resp:  [8-18] 16 (09/24 0524) BP: (127-166)/(63-96) 127/63 mmHg (09/24 0524) SpO2:  [91 %-100 %] 97 % (09/24 0524) FiO2 (%):  [28 %] 28 % (09/23 1459)  Labs: No results found for this basename: HGB,  in the last 72 hours No results found for this basename: WBC, RBC, HCT, PLT,  in the last 72 hours No results found for this basename: NA, K, CL, CO2, BUN, CREATININE, GLUCOSE, CALCIUM,  in the last 72 hours No results found for this basename: LABPT, INR,  in the last 72 hours  Physical Exam:  Neurologically intact ABD soft Neurovascular intact Sensation intact distally Intact pulses distally Dorsiflexion/Plantar flexion intact Incision: dressing C/D/I No cellulitis present Compartment soft  Assessment/Plan:  1 Day Post-Op Procedure(s) (LRB): TOTAL HIP ARTHROPLASTY ANTERIOR APPROACH (Right) Advance diet Up with therapy D/C IV fluids Plan for discharge tomorrow to brothers home Labs pending ASA 325 bid / scd Saline lock wbat    [name] [name] R 01/26/2013, 8:11 AM

## 2013-01-26 NOTE — Evaluation (Signed)
Occupational Therapy Evaluation Patient Details Name: [name] [name] MRN: [id number] DOB: 1945/05/21 Today's Date: 01/26/2013 Time: 4098-1191 OT Time Calculation (min): 15 min  OT Assessment / Plan / Recommendation History of present illness s/p Rt THA    Clinical Impression   Pt s/p Rt THA thus affecting PLOF. Will continue to follow acutely in order to address below problem list in prep for return home with 24/7 supervision/assist from brother.    OT Assessment  Patient needs continued OT Services    Follow Up Recommendations  No OT follow up;Supervision/Assistance - 24 hour    Barriers to Discharge      Equipment Recommendations  3 in 1 bedside comode    Recommendations for Other Services    Frequency  Min 2X/week    Precautions / Restrictions Precautions Precautions: Fall Restrictions Weight Bearing Restrictions: Yes RLE Weight Bearing: Weight bearing as tolerated   Pertinent Vitals/Pain See vitals    ADL  Eating/Feeding: Performed;Independent Where Assessed - Eating/Feeding: Chair Grooming: Performed;Wash/dry hands;Min guard Where Assessed - Grooming: Supported standing Lower Body Bathing: Simulated;Minimal assistance;Maximal assistance Where Assessed - Lower Body Bathing: Supported sit to stand Lower Body Dressing: Performed;Maximal assistance;Minimal assistance Where Assessed - Lower Body Dressing: Supported sit to Pharmacist, hospital: Mining engineer Method: Sit to Barista:  (chair) Equipment Used: Gait belt;Rolling walker;Reacher;Long-handled sponge;Long-handled shoe horn;Sock aid Transfers/Ambulation Related to ADLs: min assist with RW ADL Comments: Max assist for LB ADLs without AE.  Educated pt on use of AE "hip kit" and pt able to return demonstration with  min assist.  Pt appreciative of information, states he thinks he will likely have his brother assist with tasks such as donning/doffing socks and  shoes until pt is able to do so himself.  Educated pt on use of 3n1 as a shower chair or for over toilet minimize effort during toilet transfer.      OT Diagnosis: Generalized weakness;Acute pain  OT Problem List: Decreased strength;Decreased activity tolerance;Decreased knowledge of use of DME or AE;Pain OT Treatment Interventions: Self-care/ADL training;DME and/or AE instruction;Therapeutic activities;Patient/family education   OT Goals(Current goals can be found in the care plan section) Acute Rehab OT Goals Patient Stated Goal: to go home with brother tomorrow (thursday) OT Goal Formulation: With patient Time For Goal Achievement: 02/02/13 Potential to Achieve Goals: Good  Visit Information  Last OT Received On: 01/26/13 Assistance Needed: +1 History of Present Illness: s/p Rt THA        Prior Functioning     Home Living Family/patient expects to be discharged to:: Private residence Living Arrangements: Alone Available Help at Discharge: Family;Available 24 hours/day (brother) Type of Home: House Home Access: Stairs to enter [employer] of Steps: 1 Entrance Stairs-Rails: None Home Layout: One level Home Equipment: Cane - quad Additional Comments: pt has tub and walk in shower; reports one of his toilets is taller than others  Prior Function Level of Independence: Independent with assistive device(s) Communication Communication: No difficulties Dominant Hand: Right         Vision/Perception     Cognition  Cognition Arousal/Alertness: Awake/alert Behavior During Therapy: WFL for tasks assessed/performed Overall Cognitive Status: Within Functional Limits for tasks assessed    Extremity/Trunk Assessment Upper Extremity Assessment Upper Extremity Assessment: Overall WFL for tasks assessed Lower Extremity Assessment Lower Extremity Assessment: RLE deficits/detail RLE Deficits / Details: grossly 3-/5 RLE: Unable to fully assess due to pain RLE  Sensation:  (WFL To light touch) Cervical / Trunk Assessment  Cervical / Trunk Assessment: Normal     Mobility Bed Mobility Bed Mobility: Not assessed  Transfers Transfers: Sit to Stand;Stand to Sit Sit to Stand: 4: Min assist;From chair/3-in-1;With armrests;With upper extremity assist Stand to Sit: 4: Min assist;To chair/3-in-1;With armrests;With upper extremity assist Details for Transfer Assistance: VCs for hand placement.      Exercise    Balance    End of Session OT - End of Session Equipment Utilized During Treatment: Gait belt;Rolling walker Activity Tolerance: Patient limited by pain Patient left: in chair;with call bell/phone within reach;with family/visitor present Nurse Communication: Mobility status  GO    01/26/2013 Cipriano Mile OTR/L Pager [phone] Office [phone]  Cipriano Mile 01/26/2013, 10:35 AM

## 2013-01-26 NOTE — Evaluation (Signed)
Physical Therapy Evaluation Patient Details Name: [name] [name] MRN: [id number] DOB: 19-May-1945 Today's Date: 01/26/2013 Time: 0910-0930 PT Time Calculation (min): 20 min  PT Assessment / Plan / Recommendation History of Present Illness  s/p Rt THA   Clinical Impression  Pt is s/p Rt anterior THA POD#1 resulting in the deficits listed below (see PT Problem List).  Pt will benefit from skilled PT to increase their independence and safety with mobility to allow discharge to the venue listed below. Pt highly motivated to return home with brother. Anticipate good progress with therapy.        PT Assessment  Patient needs continued PT services    Follow Up Recommendations  Home health PT;Supervision/Assistance - 24 hour    Does the patient have the potential to tolerate intense rehabilitation    n/a   Barriers to Discharge   n/a    Equipment Recommendations  Rolling walker with 5" wheels;3in1 (PT)    Recommendations for Other Services OT consult   Frequency 7X/week    Precautions / Restrictions Precautions Precautions: Fall Restrictions Weight Bearing Restrictions: Yes RLE Weight Bearing: Weight bearing as tolerated   Pertinent Vitals/Pain 3-4/10; pt premedicated       Mobility  Bed Mobility Bed Mobility: Supine to Sit;Sitting - Scoot to Edge of Bed Supine to Sit: 4: Min assist;HOB elevated;With rails Sitting - Scoot to Edge of Bed: 5: Supervision;With rail Details for Bed Mobility Assistance: (A) to advance Rt LE to/off EOB; incr time due to pain; cues for hand placement and sequencing Transfers Transfers: Sit to Stand;Stand to Sit Sit to Stand: 4: Min assist;From bed Stand to Sit: 4: Min assist;With armrests;To chair/3-in-1 Details for Transfer Assistance: cues for hand placement and safety with RW; pt flopped into chair; (A) to achieve upright standing position safely and to control descent to chair  Ambulation/Gait Ambulation/Gait Assistance: 4: Min  guard Ambulation Distance (Feet): 20 Feet Assistive device: Rolling walker Ambulation/Gait Assistance Details: cues for sequencing and safety with RW; min guard for safety and to steady Gait Pattern: Step-to pattern;Decreased stance time - right;Decreased step length - left;Trunk flexed Gait velocity: decreased due to pain General Gait Details: cues for upright posture  Stairs: No Wheelchair Mobility Wheelchair Mobility: No    Exercises Total Joint Exercises Ankle Circles/Pumps: AROM;Both;10 reps;Supine Gluteal Sets: 10 reps;Seated (hold for 5 seconds each ) Heel Slides: AAROM;Right;10 reps;Seated Long Arc Quad: AAROM;Right;10 reps;Seated   PT Diagnosis: Abnormality of gait;Acute pain  PT Problem List: Decreased strength;Decreased activity tolerance;Decreased mobility;Decreased knowledge of use of DME;Pain PT Treatment Interventions: DME instruction;Gait training;Stair training;Therapeutic activities;Functional mobility training;Therapeutic exercise;Balance training;Neuromuscular re-education;Patient/family education     PT Goals(Current goals can be found in the care plan section) Acute Rehab PT Goals Patient Stated Goal: to go home with brother tomorrow (thursday) PT Goal Formulation: With patient Time For Goal Achievement: 02/02/13 Potential to Achieve Goals: Good  Visit Information  Last PT Received On: 01/26/13 Assistance Needed: +1 History of Present Illness: s/p Rt THA        Prior Functioning  Home Living Family/patient expects to be discharged to:: Private residence Living Arrangements: Alone Available Help at Discharge: Family;Available 24 hours/day (brother ) Type of Home: House Home Access: Stairs to enter [employer] of Steps: 1 (threshold) Entrance Stairs-Rails: None Home Layout: One level Home Equipment: Cane - quad Additional Comments: pt has tub and walk in shower; reports one of his toilets is taller than others  Prior Function Level of  Independence: Independent with assistive  device(s) (amb with walking stick/cane) Communication Communication: No difficulties Dominant Hand: Right    Cognition  Cognition Arousal/Alertness: Awake/alert Behavior During Therapy: WFL for tasks assessed/performed Overall Cognitive Status: Within Functional Limits for tasks assessed    Extremity/Trunk Assessment Upper Extremity Assessment Upper Extremity Assessment: Defer to OT evaluation Lower Extremity Assessment Lower Extremity Assessment: RLE deficits/detail RLE Deficits / Details: grossly 3-/5 RLE: Unable to fully assess due to pain RLE Sensation:  (WFL To light touch) Cervical / Trunk Assessment Cervical / Trunk Assessment: Normal   Balance Balance Balance Assessed: Yes Static Sitting Balance Static Sitting - Balance Support: No upper extremity supported;Feet supported Static Sitting - Level of Assistance: 5: Stand by assistance  End of Session PT - End of Session Equipment Utilized During Treatment: Gait belt Activity Tolerance: Patient tolerated treatment well Patient left: in chair;with call bell/phone within reach Nurse Communication: Mobility status  GP     Donell Sievert, [place] [phone] 01/26/2013, 9:39 AM

## 2013-01-27 ENCOUNTER — Encounter (HOSPITAL_COMMUNITY): Payer: Self-pay | Admitting: Orthopaedic Surgery

## 2013-01-27 LAB — CBC
HCT: 35.5 % — ABNORMAL LOW (ref 39.0–52.0)
Hemoglobin: 12 g/dL — ABNORMAL LOW (ref 13.0–17.0)
MCH: 31.5 pg (ref 26.0–34.0)
MCHC: 33.8 g/dL (ref 30.0–36.0)
MCV: 93.2 fL (ref 78.0–100.0)
Platelets: 187 10*3/uL (ref 150–400)
RDW: 13.4 % (ref 11.5–15.5)

## 2013-01-27 LAB — GLUCOSE, CAPILLARY: Glucose-Capillary: 146 mg/dL — ABNORMAL HIGH (ref 70–99)

## 2013-01-27 MED ORDER — ASPIRIN 325 MG PO TBEC: 325.0000 mg | DELAYED_RELEASE_TABLET | Freq: Two times a day (BID) | ORAL | Status: DC

## 2013-01-27 MED ORDER — METHOCARBAMOL 500 MG PO TABS: 500.0000 mg | ORAL_TABLET | Freq: Four times a day (QID) | ORAL | Status: DC | PRN

## 2013-01-27 MED ORDER — OXYCODONE HCL 5 MG PO TABS: 5.0000 mg | ORAL_TABLET | ORAL | Status: DC | PRN

## 2013-01-27 NOTE — Progress Notes (Signed)
Subjective: 2 Days Post-Op Procedure(s) (LRB): TOTAL HIP ARTHROPLASTY ANTERIOR APPROACH (Right) Doing well with physical therapy recommendation to be released home. Activity level:  Weightbearing as tolerated Diet tolerance:  ok Voiding:  ok Patient reports pain as 1 on 0-10 scale.    Objective: Vital signs in last 24 hours: Temp:  [99.1 F (37.3 C)-99.5 F (37.5 C)] 99.2 F (37.3 C) (09/25 0512) Pulse Rate:  [72-79] 72 (09/25 0512) Resp:  [16-18] 16 (09/25 0512) BP: (149-156)/(71-80) 149/71 mmHg (09/25 0512) SpO2:  [95 %-100 %] 95 % (09/25 0814)  Labs:  Recent Labs  01/26/13 0945 01/27/13 0940  HGB 11.6* 12.0*    Recent Labs  01/26/13 0945 01/27/13 0940  WBC 9.0 11.0*  RBC 3.68* 3.81*  HCT 34.4* 35.5*  PLT 179 187    Recent Labs  01/26/13 0945  NA 136  K 3.5  CL 98  CO2 27  BUN 11  CREATININE 1.14  GLUCOSE 121*  CALCIUM 8.2*   No results found for this basename: LABPT, INR,  in the last 72 hours  Physical Exam:  Neurologically intact ABD soft Neurovascular intact Sensation intact distally Intact pulses distally Dorsiflexion/Plantar flexion intact Incision: dressing C/D/I No cellulitis present Compartment soft  Assessment/Plan:  2 Days Post-Op Procedure(s) (LRB): TOTAL HIP ARTHROPLASTY ANTERIOR APPROACH (Right) Advance diet Up with therapy Discharge home with home health May be weightbearing as tolerated. ASA 325 one twice a day x4 weeks. Return to office in 2 weeks for recheck. Home health for physical therapy  may change dressing as needed.    Evelio Rueda R 01/27/2013, 1:23 PM

## 2013-01-27 NOTE — Progress Notes (Signed)
Occupational Therapy Treatment & Discharge Patient Details Name: [name] [name] MRN: [id number] DOB: Sep 28, 1945 Today's Date: 01/27/2013 Time:  -     OT Assessment / Plan / Recommendation  History of present illness s/p Rt THA    OT comments  Pt completed LB ADLs w/o use of AE w/min A. Brother present. Anticipate pt to be able to complete LB ADLs w/supervision if using AE. Educated pt & brother re: hip kit available for purchase at gift shop. Brother called family member to verify that they have reacher & LH shoehorn available for use at home. Pt at overall Supervision level for ADL mobility and transfers. Reviewed RW safety & DME during bathroom transfers. No further OT indicated at this time. Pt cleared for d/c home w/family.   Follow Up Recommendations  No OT follow up;Supervision/Assistance - 24 hour    Barriers to Discharge       Equipment Recommendations  3 in 1 bedside comode    Recommendations for Other Services    Frequency     Progress towards OT Goals Progress towards OT goals: Goals met/education completed, patient discharged from OT  Plan All goals met and education completed, patient discharged from OT services    Precautions / Restrictions Precautions Precautions: None Restrictions Weight Bearing Restrictions: Yes RLE Weight Bearing: Weight bearing as tolerated   Pertinent Vitals/Pain 3/10 pain pre/post tx.     ADL  Grooming: Performed;Supervision/safety Where Assessed - Grooming: Supported standing Upper Body Dressing: Performed;Independent Where Assessed - Upper Body Dressing: Unsupported sitting Lower Body Dressing: Performed;Minimal assistance (w/o AE) Where Assessed - Lower Body Dressing: Unsupported sit to stand Toilet Transfer: Performed;Supervision/safety    OT Diagnosis:    OT Problem List:   OT Treatment Interventions:     OT Goals(current goals can now be found in the care plan section) Acute Rehab OT Goals Patient Stated Goal: to get home  today with my brother   Visit Information  Assistance Needed: +1 History of Present Illness: s/p Rt THA     Subjective Data      Prior Functioning       Cognition  Cognition Arousal/Alertness: Awake/alert Behavior During Therapy: WFL for tasks assessed/performed Overall Cognitive Status: Within Functional Limits for tasks assessed    Mobility  Bed Mobility Bed Mobility: Supine to Sit;Sitting - Scoot to Edge of Bed Supine to Sit: 5: Supervision;HOB flat Sitting - Scoot to Edge of Bed: 5: Supervision Details for Bed Mobility Assistance: HOB flattened to simulate home; supervision for safety only; no physical (A) needed  Transfers Sit to Stand: 5: Supervision;From bed Stand to Sit: 5: Supervision;To chair/3-in-1;With armrests Details for Transfer Assistance: supervision for hand placement and safety    Exercises  Total Joint Exercises Ankle Circles/Pumps: AROM;Both;10 reps;Supine Quad Sets: AROM;Right;10 reps Gluteal Sets: 10 reps Heel Slides: AAROM;Right;10 reps;Seated Hip ABduction/ADduction: AAROM;Right;10 reps (with use of UE support ) Long Arc Quad: AROM;Right;10 reps Knee Flexion: AROM;Right;10 reps;Standing   Balance Balance Balance Assessed: Yes Static Standing Balance Static Standing - Balance Support: Bilateral upper extremity supported;During functional activity Static Standing - Level of Assistance: 5: Stand by assistance   End of Session  Pt left in recliner w/brother present awaiting d/c home.        [name], [place] 01/27/2013, 11:37 AM

## 2013-01-27 NOTE — Progress Notes (Signed)
SW received a consult for possible placement. PT  At this time is recommending home with HH and not SNF. CSW will make CM aware. Clinical Social Worker will sign off for now as social work intervention is no longer needed. Please consult us again if new need arises.   Kierston Plasencia, MSW [phone] 

## 2013-01-27 NOTE — Progress Notes (Signed)
Physical Therapy Treatment Patient Details Name: [name] [name] MRN: [id number] DOB: 03-May-1946 Today's Date: 01/27/2013 Time: 1308-6578 PT Time Calculation (min): 25 min  PT Assessment / Plan / Recommendation  History of Present Illness s/p Rt THA    PT Comments   Pt progressing well with mobility. Is at supervision level for transfers and amb. Pt refusing need to practice the single step at his house, stated his brother has a ramp for him to go up with his RW. Pt is safe from mobility standpoint to D/C with his brother at home with im for 24/7 (A). Pt encouraged to have brother with him when OOB with amb throughout house to reduce risk of falls, pt agreeable. Reviewed HEP thoroughly with pt and pt demo good technique with exercises.    Follow Up Recommendations  Home health PT;Supervision/Assistance - 24 hour     Does the patient have the potential to tolerate intense rehabilitation     Barriers to Discharge        Equipment Recommendations  Rolling walker with 5" wheels;3in1 (PT)    Recommendations for Other Services    Frequency 7X/week   Progress towards PT Goals Progress towards PT goals: Progressing toward goals  Plan Current plan remains appropriate    Precautions / Restrictions Precautions Precautions: None Restrictions Weight Bearing Restrictions: Yes RLE Weight Bearing: Weight bearing as tolerated   Pertinent Vitals/Pain "i had pain medicine so about a 2/10 now"     Mobility  Bed Mobility Bed Mobility: Supine to Sit;Sitting - Scoot to Edge of Bed Supine to Sit: 5: Supervision;HOB flat Sitting - Scoot to Edge of Bed: 5: Supervision Details for Bed Mobility Assistance: HOB flattened to simulate home; supervision for safety only; no physical (A) needed  Transfers Transfers: Sit to Stand;Stand to Sit Sit to Stand: 5: Supervision;From bed Stand to Sit: 5: Supervision;To chair/3-in-1;With armrests Details for Transfer Assistance: supervision for hand placement and  safety Ambulation/Gait Ambulation/Gait Assistance: 5: Supervision Ambulation Distance (Feet): 150 Feet Assistive device: Rolling walker Ambulation/Gait Assistance Details: cues for gt sequencing and safety with RW; pt highly motivated to incr amb distance; pt requires breaks to "stretch" out Rt LE (pt c/o mild buckling when fully WB through Rt LE) Gait Pattern: Step-to pattern;Decreased stance time - right;Decreased step length - left;Trunk flexed;Step-through pattern Gait velocity: decreased  Stairs: No (pt stated brother has a ramp for him) Wheelchair Mobility Wheelchair Mobility: No    Exercises Total Joint Exercises Ankle Circles/Pumps: AROM;Both;10 reps;Supine Quad Sets: AROM;Right;10 reps Gluteal Sets: 10 reps Heel Slides: AAROM;Right;10 reps;Seated Hip ABduction/ADduction: AAROM;Right;10 reps (with use of UE support ) Long Arc Quad: AROM;Right;10 reps Knee Flexion: AROM;Right;10 reps;Standing   PT Diagnosis:    PT Problem List:   PT Treatment Interventions:     PT Goals (current goals can now be found in the care plan section) Acute Rehab PT Goals Patient Stated Goal: to get home today with my brother  PT Goal Formulation: With patient Time For Goal Achievement: 02/02/13 Potential to Achieve Goals: Good  Visit Information  Last PT Received On: 01/27/13 Assistance Needed: +1 History of Present Illness: s/p Rt THA     Subjective Data  Subjective: pt lying supine agreeable to therapy; "i was ready for you."  Patient Stated Goal: to get home today with my brother    Cognition  Cognition Arousal/Alertness: Awake/alert Behavior During Therapy: WFL for tasks assessed/performed Overall Cognitive Status: Within Functional Limits for tasks assessed    Balance  Balance Balance  Assessed: Yes Static Standing Balance Static Standing - Balance Support: Bilateral upper extremity supported;During functional activity Static Standing - Level of Assistance: 5: Stand by  assistance  End of Session PT - End of Session Equipment Utilized During Treatment: Gait belt Activity Tolerance: Patient tolerated treatment well Patient left: in chair;with call bell/phone within reach Nurse Communication: Mobility status   GP     Donell Sievert, [place] [phone] 01/27/2013, 10:19 AM

## 2013-01-27 NOTE — Discharge Summary (Signed)
Patient ID: [name] [name] MRN: [id number] DOB/AGE: 1945/10/20 67 y.o.  Admit date: 01/25/2013 Discharge date: 01/27/2013  Admission Diagnoses:  Principal Problem:   DJD (degenerative joint disease) of hip   Discharge Diagnoses:  Same  Past Medical History  Diagnosis Date  . Hypertension   . Headache(784.0)   . Bradycardia     pacemaker - Medtronic Adapta #[id number] - December 22, 2005  . Hyperlipidemia   . Depression   . Insomnia   . Chronic back pain   . Anginal pain   . Dysrhythmia     pt states I have when they check the pacemaker,  . COPD (chronic obstructive pulmonary disease)   . Pneumonia     had two times unsure of when  . Diabetes mellitus     type II controled with diet  . Pacemaker     Medtronic Dr. Graciela Husbands   . GERD (gastroesophageal reflux disease)   . Arthritis   . Neuromuscular disorder     peripheral neuropathy BLE  . Hx of cardiovascular stress test     Lexiscan Myoview (9/14):  Low risk, small mild basal to mid inferior perfusion defect (suspected diaphragmatic attenuation), no ischemia, EF 57%    Surgeries: Procedure(s): TOTAL HIP ARTHROPLASTY ANTERIOR APPROACH on 01/25/2013   Consultants:    Discharged Condition: Improved  Hospital Course: [name] [name] is an 67 y.o. male who was admitted 01/25/2013 for operative treatment ofDJD (degenerative joint disease) of hip. Patient has severe unremitting pain that affects sleep, daily activities, and work/hobbies. After pre-op clearance the patient was taken to the operating room on 01/25/2013 and underwent  Procedure(s): TOTAL HIP ARTHROPLASTY ANTERIOR APPROACH.    Patient was given perioperative antibiotics: Anti-infectives   Start     Dose/Rate Route Frequency Ordered Stop   01/25/13 1900  vancomycin (VANCOCIN) IVPB 1000 mg/200 mL premix     1,000 mg 200 mL/hr over 60 Minutes Intravenous Every 12 hours 01/25/13 1458 01/25/13 1907   01/25/13 0737  vancomycin (VANCOCIN) 1 GM/200ML IVPB    Comments:   SCRONCE, TRINA: cabinet override      01/25/13 0737 01/25/13 0737       Patient was given sequential compression devices, early ambulation, and chemoprophylaxis to prevent DVT.  Patient benefited maximally from hospital stay and there were no complications.    Recent vital signs: Patient Vitals for the past 24 hrs:  BP Temp Temp src Pulse Resp SpO2  01/27/13 0814 - - - - - 95 %  01/27/13 0512 149/71 mmHg 99.2 F (37.3 C) - 72 16 100 %  01/26/13 2139 156/80 mmHg 99.5 F (37.5 C) - 79 16 97 %  01/26/13 1342 152/75 mmHg 99.1 F (37.3 C) Oral 72 18 99 %     Recent laboratory studies:  Recent Labs  01/26/13 0945 01/27/13 0940  WBC 9.0 11.0*  HGB 11.6* 12.0*  HCT 34.4* 35.5*  PLT 179 187  NA 136  --   K 3.5  --   CL 98  --   CO2 27  --   BUN 11  --   CREATININE 1.14  --   GLUCOSE 121*  --   CALCIUM 8.2*  --      Discharge Medications:     Medication List    STOP taking these medications       aspirin 81 MG tablet  Replaced by:  aspirin 325 MG EC tablet      TAKE these medications  albuterol 108 (90 BASE) MCG/ACT inhaler  Commonly known as:  PROVENTIL HFA;VENTOLIN HFA  Inhale 2 puffs into the lungs 2 (two) times daily as needed.     aspirin 325 MG EC tablet  Take 1 tablet (325 mg total) by mouth 2 (two) times daily.     atorvastatin 40 MG tablet  Commonly known as:  LIPITOR  Take 40 mg by mouth daily.     B Complex-B12 Tabs  Take 1 tablet by mouth daily.     budesonide-formoterol 160-4.5 MCG/ACT inhaler  Commonly known as:  SYMBICORT  Inhale 2 puffs into the lungs 2 (two) times daily.     docusate sodium 100 MG capsule  Commonly known as:  COLACE  Take 100 mg by mouth daily.     esomeprazole 40 MG capsule  Commonly known as:  NEXIUM  Take 40 mg by mouth daily before breakfast.     gabapentin 100 MG capsule  Commonly known as:  NEURONTIN  Take 100 mg by mouth 4 (four) times daily as needed (for pain).     losartan 50 MG tablet  Commonly  known as:  COZAAR  Take 50 mg by mouth daily.     Magnesium 250 MG Tabs  Take 1 tablet by mouth daily.     methocarbamol 500 MG tablet  Commonly known as:  ROBAXIN  Take 1 tablet (500 mg total) by mouth every 6 (six) hours as needed.     Omega 3 1200 MG Caps  Take 1 capsule by mouth daily.     OVER THE COUNTER MEDICATION  Take 1-2 tablets by mouth daily. [employer] brand of allergy relief     oxyCODONE 5 MG immediate release tablet  Commonly known as:  Oxy IR/ROXICODONE  Take 1-2 tablets (5-10 mg total) by mouth every 4 (four) hours as needed.     triamterene-hydrochlorothiazide 37.5-25 MG per tablet  Commonly known as:  MAXZIDE-25  Take 1 tablet by mouth daily.     vitamin C 500 MG tablet  Commonly known as:  ASCORBIC ACID  Take 500 mg by mouth daily.     vitamin E 400 UNIT capsule  Take 400 Units by mouth daily.        Diagnostic Studies: Dg Chest 2 View  01/17/2013   CLINICAL DATA:  Preoperative for hip surgery. History of hypertension, COPD, and pacemaker.  EXAM: CHEST  2 VIEW  COMPARISON:  05/02/2011  FINDINGS: Dual lead pacer remains in place. Bandlike lingular density is mildly increased in conspicuity. No cardiomegaly. Mild fullness of the left hilum is unchanged from 2007 and thought to be vascular.  IMPRESSION: 1. Bandlike lingular density appears marked conspicuous than on 05/02/2011. Given the increase in size, I recommend either chest CT now, or a followup chest radiograph in 6 weeks time to assess for resolution. This recommendation is mainly to make sure that this change in appearance does not represent a malignancy.   Electronically Signed   By: Herbie Baltimore   On: 01/17/2013 09:21   Dg Hip Operative Right  01/25/2013   *RADIOLOGY REPORT*  Clinical Data: Anterior approach right total hip replacement, initial encounter.  DG OPERATIVE RIGHT HIP  Comparison: Right hip radiographs - 10/25/2012  Findings:  Two spot fluoroscopic intraoperative radiographic images of  the right hip and lower pelvis are provided for review.  Images demonstrate the sequela of right total hip replacement. Alignment appears anatomic on the provided anterior projection radiograph.  No definite fracture.  There is  a minimal amount of expected subcutaneous emphysema about the operative site.  No radiopaque foreign body.  IMPRESSION: Post right total hip replacement without evidence of complication.   Original Report Authenticated By: Tacey Ruiz, MD   Ct Chest Wo Contrast  01/20/2013   *RADIOLOGY REPORT*  Clinical Data: Follow up lung nodule  CT CHEST WITHOUT CONTRAST  Technique:  Multidetector CT imaging of the chest was performed following the standard protocol without IV contrast.  Comparison: 01/17/2013  Findings: No pleural effusion.  No airspace consolidation or atelectasis identified.  Mild central lobular emphysema.  No suspicious pulmonary parenchymal nodule or mass identified.  The trachea appears patent and is midline.  The heart size appears normal.  No pericardial effusion. Prominent mediastinal lymph nodes are noted.  Borderline enlarged subcarinal lymph node measures 1 cm in short axis dimension, image 25/series 2.   There is no hilar adenopathy.   The esophagus appears within normal limits. No axillary or supraclavicular adenopathy.   Limited imaging through the upper abdomen demonstrate several tiny stones within the dependent portion of the gallbladder.  The adrenal glands are both normal.  Review of the visualized osseous structures is remarkable for mild multilevel spondylosis.  No aggressive lytic or sclerotic bone lesions identified.  IMPRESSION:  1.  No suspicious pulmonary nodule or mass identified. 2.  No acute cardiopulmonary abnormalities. 3.  Mildly prominent mediastinal lymph nodes.   Original Report Authenticated By: Signa Kell, M.D.    Disposition:       Discharge Orders   Future Appointments Provider Department Dept Phone   11/11/2013 9:15 AM Sherrie George, MD [facility] [phone]   Future Orders Complete By Expires   Call MD / Call 911  As directed    Comments:     If you experience chest pain or shortness of breath, CALL 911 and be transported to the hospital emergency room.  If you develope a fever above 101 F, pus (white drainage) or increased drainage or redness at the wound, or calf pain, call your surgeon's office.   Constipation Prevention  As directed    Comments:     Drink plenty of fluids.  Prune juice may be helpful.  You may use a stool softener, such as Colace (over the counter) 100 mg twice a day.  Use MiraLax (over the counter) for constipation as needed.   Diet - low sodium heart healthy  As directed    Increase activity slowly as tolerated  As directed       Follow-up Information   Follow up with Velna Ochs, MD. Schedule an appointment as soon as possible for a visit in 2 weeks.   Specialty:  Orthopedic Surgery   Contact information:   [address] [place] [address] [phone]       Follow up with [facility].   Contact information:   [address] [place] [address] [phone]        Signed: Prince Rome 01/27/2013, 1:30 PM

## 2013-01-27 NOTE — Progress Notes (Signed)
Patient provided with discharge instructions and follow up information. [facility] is set up with [facility]. He has no further questions at this time and is going home with family.

## 2013-01-27 NOTE — Plan of Care (Signed)
Problem: Acute Rehab OT Goals (only OT should resolve) Goal: Pt. Will Perform Lower Body Dressing Outcome: Adequate for Discharge Pt able to complete LB dressing w/o AE and min A. Family to purchase AE. Reviewed use. Pt should be able to complete LB ADLs w/supervision using AE.

## 2013-01-29 LAB — REMOTE PACEMAKER DEVICE
AL AMPLITUDE: 2.8 mv
AL THRESHOLD: 0.75 V
ATRIAL PACING PM: 71
BAMS-0001: 175 {beats}/min
RV LEAD AMPLITUDE: 16 mv

## 2013-02-07 ENCOUNTER — Encounter: Payer: Self-pay | Admitting: *Deleted

## 2013-04-25 ENCOUNTER — Ambulatory Visit (INDEPENDENT_AMBULATORY_CARE_PROVIDER_SITE_OTHER): Payer: Medicare Other | Admitting: *Deleted

## 2013-04-25 DIAGNOSIS — I495 Sick sinus syndrome: Secondary | ICD-10-CM

## 2013-05-03 LAB — MDC_IDC_ENUM_SESS_TYPE_REMOTE
Battery Remaining Longevity: 24 mo
Brady Statistic AP VP Percent: 0 %
Brady Statistic AS VP Percent: 0 %
Brady Statistic AS VS Percent: 40 %
Lead Channel Impedance Value: 453 Ohm
Lead Channel Impedance Value: 576 Ohm
Lead Channel Pacing Threshold Amplitude: 0.75 V
Lead Channel Pacing Threshold Pulse Width: 0.4 ms
Lead Channel Sensing Intrinsic Amplitude: 16 mV
Lead Channel Sensing Intrinsic Amplitude: 2.8 mV
Lead Channel Setting Pacing Amplitude: 2 V
Lead Channel Setting Pacing Pulse Width: 0.4 ms
Lead Channel Setting Sensing Sensitivity: 2 mV

## 2013-05-12 ENCOUNTER — Ambulatory Visit: Payer: Medicare (Managed Care) | Attending: Orthopaedic Surgery | Admitting: Physical Therapy

## 2013-05-12 DIAGNOSIS — Z950 Presence of cardiac pacemaker: Secondary | ICD-10-CM | POA: Insufficient documentation

## 2013-05-12 DIAGNOSIS — IMO0001 Reserved for inherently not codable concepts without codable children: Secondary | ICD-10-CM | POA: Insufficient documentation

## 2013-05-12 DIAGNOSIS — M25559 Pain in unspecified hip: Secondary | ICD-10-CM | POA: Insufficient documentation

## 2013-05-12 DIAGNOSIS — J449 Chronic obstructive pulmonary disease, unspecified: Secondary | ICD-10-CM | POA: Insufficient documentation

## 2013-05-12 DIAGNOSIS — R269 Unspecified abnormalities of gait and mobility: Secondary | ICD-10-CM | POA: Insufficient documentation

## 2013-05-12 DIAGNOSIS — R5381 Other malaise: Secondary | ICD-10-CM | POA: Insufficient documentation

## 2013-05-12 DIAGNOSIS — J4489 Other specified chronic obstructive pulmonary disease: Secondary | ICD-10-CM | POA: Insufficient documentation

## 2013-05-12 DIAGNOSIS — I10 Essential (primary) hypertension: Secondary | ICD-10-CM | POA: Insufficient documentation

## 2013-05-17 ENCOUNTER — Ambulatory Visit: Payer: Medicare (Managed Care) | Admitting: Physical Therapy

## 2013-05-18 ENCOUNTER — Encounter: Payer: Self-pay | Admitting: *Deleted

## 2013-05-19 ENCOUNTER — Ambulatory Visit: Payer: Medicare (Managed Care) | Admitting: *Deleted

## 2013-05-24 ENCOUNTER — Ambulatory Visit: Payer: Medicare (Managed Care) | Admitting: *Deleted

## 2013-05-26 ENCOUNTER — Ambulatory Visit: Payer: Medicare (Managed Care) | Admitting: *Deleted

## 2013-05-26 ENCOUNTER — Encounter: Payer: Self-pay | Admitting: Internal Medicine

## 2013-05-31 ENCOUNTER — Ambulatory Visit: Payer: Medicare (Managed Care) | Admitting: Physical Therapy

## 2013-06-02 ENCOUNTER — Ambulatory Visit: Payer: Medicare (Managed Care) | Admitting: Physical Therapy

## 2013-06-20 ENCOUNTER — Ambulatory Visit: Payer: Medicare (Managed Care) | Attending: Orthopaedic Surgery | Admitting: Physical Therapy

## 2013-06-20 DIAGNOSIS — R269 Unspecified abnormalities of gait and mobility: Secondary | ICD-10-CM | POA: Insufficient documentation

## 2013-06-20 DIAGNOSIS — IMO0001 Reserved for inherently not codable concepts without codable children: Secondary | ICD-10-CM | POA: Insufficient documentation

## 2013-06-20 DIAGNOSIS — R5381 Other malaise: Secondary | ICD-10-CM | POA: Insufficient documentation

## 2013-06-20 DIAGNOSIS — M25559 Pain in unspecified hip: Secondary | ICD-10-CM | POA: Insufficient documentation

## 2013-06-23 ENCOUNTER — Encounter: Payer: 59 | Admitting: Physical Therapy

## 2013-06-27 ENCOUNTER — Ambulatory Visit: Payer: Medicare (Managed Care) | Admitting: Physical Therapy

## 2013-06-29 ENCOUNTER — Ambulatory Visit: Payer: Medicare (Managed Care) | Admitting: Physical Therapy

## 2013-07-04 ENCOUNTER — Ambulatory Visit: Payer: Medicare Other | Attending: Orthopaedic Surgery | Admitting: Physical Therapy

## 2013-07-04 DIAGNOSIS — R269 Unspecified abnormalities of gait and mobility: Secondary | ICD-10-CM | POA: Insufficient documentation

## 2013-07-04 DIAGNOSIS — IMO0001 Reserved for inherently not codable concepts without codable children: Secondary | ICD-10-CM | POA: Insufficient documentation

## 2013-07-04 DIAGNOSIS — R5381 Other malaise: Secondary | ICD-10-CM | POA: Insufficient documentation

## 2013-07-04 DIAGNOSIS — M25559 Pain in unspecified hip: Secondary | ICD-10-CM | POA: Insufficient documentation

## 2013-07-06 ENCOUNTER — Ambulatory Visit: Payer: Medicare Other | Admitting: Physical Therapy

## 2013-07-12 ENCOUNTER — Ambulatory Visit: Payer: Medicare Other | Admitting: *Deleted

## 2013-07-14 ENCOUNTER — Ambulatory Visit: Payer: Medicare Other | Admitting: *Deleted

## 2013-07-19 ENCOUNTER — Encounter: Payer: 59 | Admitting: *Deleted

## 2013-07-22 ENCOUNTER — Ambulatory Visit: Payer: Medicare Other | Admitting: *Deleted

## 2013-07-26 ENCOUNTER — Ambulatory Visit: Payer: Medicare Other | Admitting: *Deleted

## 2013-07-27 ENCOUNTER — Ambulatory Visit (INDEPENDENT_AMBULATORY_CARE_PROVIDER_SITE_OTHER): Payer: Medicare Other | Admitting: *Deleted

## 2013-07-27 DIAGNOSIS — I495 Sick sinus syndrome: Secondary | ICD-10-CM

## 2013-07-29 ENCOUNTER — Ambulatory Visit: Payer: Medicare Other | Admitting: *Deleted

## 2013-07-29 LAB — MDC_IDC_ENUM_SESS_TYPE_REMOTE
Battery Voltage: 2.73 V
Brady Statistic AP VP Percent: 0.4 %
Brady Statistic AP VS Percent: 57.8 %
Brady Statistic AS VP Percent: 0.1 % — CL
Brady Statistic AS VS Percent: 41.7 %
Lead Channel Impedance Value: 580 Ohm
Lead Channel Pacing Threshold Amplitude: 0.875 V
Lead Channel Pacing Threshold Amplitude: 1.125 V
Lead Channel Pacing Threshold Pulse Width: 0.4 ms
Lead Channel Sensing Intrinsic Amplitude: 2.8 mV
Lead Channel Sensing Intrinsic Amplitude: 5.6 mV
MDC IDC MSMT BATTERY IMPEDANCE: 2180 Ohm
MDC IDC MSMT BATTERY REMAINING LONGEVITY: 24 mo
MDC IDC MSMT LEADCHNL RA IMPEDANCE VALUE: 467 Ohm
MDC IDC MSMT LEADCHNL RV PACING THRESHOLD PULSEWIDTH: 0.4 ms
MDC IDC SET LEADCHNL RA PACING AMPLITUDE: 2 V
MDC IDC SET LEADCHNL RV PACING AMPLITUDE: 2.5 V
MDC IDC SET LEADCHNL RV PACING PULSEWIDTH: 0.4 ms
MDC IDC SET LEADCHNL RV SENSING SENSITIVITY: 2 mV

## 2013-08-02 ENCOUNTER — Ambulatory Visit: Payer: Medicare Other | Admitting: *Deleted

## 2013-08-03 ENCOUNTER — Encounter: Payer: Self-pay | Admitting: Nurse Practitioner

## 2013-08-03 ENCOUNTER — Ambulatory Visit (INDEPENDENT_AMBULATORY_CARE_PROVIDER_SITE_OTHER): Payer: Medicare Other | Admitting: Nurse Practitioner

## 2013-08-03 VITALS — BP 140/82 | HR 64 | Ht 72.0 in | Wt 221.4 lb

## 2013-08-03 DIAGNOSIS — I10 Essential (primary) hypertension: Secondary | ICD-10-CM

## 2013-08-03 MED ORDER — AMLODIPINE BESYLATE 2.5 MG PO TABS: 2.5000 mg | ORAL_TABLET | Freq: Two times a day (BID) | ORAL | Status: DC

## 2013-08-03 NOTE — Patient Instructions (Addendum)
Try to restrict your salt  I would like to increase the Norvasc 2.5 mg to two times a day - morning and at bedtime  I will see you in 2 to 3 weeks  Monitor your blood pressure at different times of day - keep a diary - bring your cuff with you when you come back  Call the [facility] office at [phone] if you have any questions, problems or concerns.

## 2013-08-03 NOTE — Progress Notes (Signed)
Erick Colace Date of Birth: 29-Nov-1945 Medical Record #[id number]  History of Present Illness: Mr. Stingley is seen back today for a work in visit. Seen for Dr. Graciela Husbands. 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   Last seen here for pre op visit/surgical clearance. Myoview ordered - low risk. Also noted to have a lung nodule and CT was ordered to further define. This was negative.  Comes in today. Here with his daughter, [name]. Notes that BP has been a recent issue. Running high - up to 160/110 - seen by PCP - had losartan increased - then Norvasc added at 2.5 mg a day.  BP has done some better but still high in the am. Daughter is concerned. Does eat too much salt - eats out a lot. No chest pain. Not short of breath. Not dizzy. Never had cuff checked for correlation.  Current Outpatient Prescriptions  Medication Sig Dispense Refill  . albuterol (PROVENTIL HFA;VENTOLIN HFA) 108 (90 BASE) MCG/ACT inhaler Inhale 2 puffs into the lungs 2 (two) times daily as needed.       [name] aspirin 81 MG tablet Take 81 mg by mouth daily.      [name] atorvastatin (LIPITOR) 40 MG tablet Take 40 mg by mouth daily.      . B Complex Vitamins (B COMPLEX-B12) TABS Take 1 tablet by mouth daily.       . budesonide-formoterol (SYMBICORT) 160-4.5 MCG/ACT inhaler Inhale 2 puffs into the lungs 2 (two) times daily.      [name] docusate sodium (COLACE) 100 MG capsule Take 100 mg by mouth daily.      [name] gabapentin (NEURONTIN) 100 MG capsule Take 100 mg by mouth 4 (four) times daily as needed (for pain).      [name] losartan (COZAAR) 50 MG tablet Take 100 mg by mouth daily.       . Magnesium 250 MG TABS Take 1 tablet by mouth daily.      . Omega 3 1200 MG CAPS Take 1 capsule by mouth daily.      [name] OVER THE COUNTER MEDICATION Take 1-2 tablets by mouth daily. [employer] brand of allergy relief      . triamterene-hydrochlorothiazide (MAXZIDE-25) 37.5-25 MG per tablet  Take 1 tablet by mouth daily.  90 tablet  3  . vitamin C (ASCORBIC ACID) 500 MG tablet Take 500 mg by mouth daily.      . vitamin E 400 UNIT capsule Take 400 Units by mouth daily.         No current facility-administered medications for this visit.    Allergies  Allergen Reactions  . Penicillins Anaphylaxis  . Metoprolol Tartrate     HR drops too low  . Codeine Rash    Past Medical History  Diagnosis Date  . Hypertension   . Headache(784.0)   . Bradycardia     pacemaker - Medtronic Adapta #ADDRO1 - December 22, 2005  . Hyperlipidemia   . Depression   . Insomnia   . Chronic back pain   . Anginal pain   . Dysrhythmia     pt states I have when they check the pacemaker,  . COPD (chronic obstructive pulmonary disease)   . Pneumonia     had two times unsure of when  . Diabetes mellitus     type II controled with diet  . Pacemaker     Medtronic Dr. Graciela Husbands   .  GERD (gastroesophageal reflux disease)   . Arthritis   . Neuromuscular disorder     peripheral neuropathy BLE  . Hx of cardiovascular stress test     Lexiscan Myoview (9/14):  Low risk, small mild basal to mid inferior perfusion defect (suspected diaphragmatic attenuation), no ischemia, EF 57%    Past Surgical History  Procedure Laterality Date  . Back surgery    . Hemorrhoid surgery    . Pacemaker insertion    . Insert / replace / remove pacemaker    . Total hip arthroplasty Right 01/25/2013    Procedure: TOTAL HIP ARTHROPLASTY ANTERIOR APPROACH;  Surgeon: Velna OchsPeter G Dalldorf, MD;  Location: [facility] OR;  Service: Orthopedics;  Laterality: Right;    History  Smoking status  . Former Smoker -- 2.00 packs/day for 15 years  Smokeless tobacco  . Not on file    History  Alcohol Use  . Yes    Comment: beer occ    Family History  Problem Relation Age of Onset  . Heart disease Mother     Review of Systems: The review of systems is per the HPI.  All other systems were reviewed and are negative.  Physical Exam: BP  140/82  Pulse 64  Ht 6' (1.829 m)  Wt 221 lb 6.4 oz (100.426 kg)  BMI 30.02 kg/m2 Patient is very pleasant and in no acute distress. Skin is warm and dry. Color is normal.  HEENT is unremarkable. Normocephalic/atraumatic. PERRL. Sclera are nonicteric. Neck is supple. No masses. No JVD. Lungs are clear. Cardiac exam shows a regular rate and rhythm. Abdomen is soft. Extremities are without edema. Gait and ROM are intact. No gross neurologic deficits noted.  LABORATORY DATA:  Lab Results  Component Value Date   WBC 11.0* 01/27/2013   HGB 12.0* 01/27/2013   HCT 35.5* 01/27/2013   PLT 187 01/27/2013   GLUCOSE 121* 01/26/2013   NA 136 01/26/2013   K 3.5 01/26/2013   CL 98 01/26/2013   CREATININE 1.14 01/26/2013   BUN 11 01/26/2013   CO2 27 01/26/2013   INR 0.91 01/17/2013   Myoview Impression from September 2014  Exercise Capacity: Lexiscan with no exercise.  BP Response: Normal blood pressure response.  Clinical Symptoms: Shortness of breath  ECG Impression: No significant ST segment change suggestive of ischemia.  Comparison with Prior Nuclear Study: No images to compare  Overall Impression: Low risk stress nuclear study with a small, mild basal to mid inferior perfusion defect. No ischemia. Given normal wall motion, suspect diaphragmatic attenuation. .  LV Ejection Fraction: 57. LV Wall Motion: NL LV Function; NL Wall Motion  Marca AnconaDalton McLean  01/24/2013   Assessment / Plan: 1. HTN - will increase the Norvasc to 2.5 mg BID - morning and bedtime. See back in 2 to 3 weeks with BP cuff and diary. Restrict salt.   2. HLD  3. COPD  4. Underlying PPM - followed by Dr. Graciela HusbandsKlein. Apparently nearing ERI.  Patient is agreeable to this plan and will call if any problems develop in the interim.   Rosalio MacadamiaLori C. Jaylenne Hamelin, RN, ANP-C [facility] [address] [place], [place]  [id number] [phone]

## 2013-08-04 ENCOUNTER — Telehealth: Payer: Self-pay | Admitting: *Deleted

## 2013-08-04 NOTE — Telephone Encounter (Signed)
Message copied by Debbe BalesGONZALEZ, Safari Cinque R on Thu Aug 04, 2013 12:25 PM ------      Message from: Rosalio MacadamiaGERHARDT, LORI C      Created: Thu Aug 04, 2013  7:34 AM       Duwayne Heckanielle,      Will you call and let him know that he has about 2 years of battery life left on his device.            Thanks      lori      ----- Message -----         From: Donalynn FurlongKristin McNeill         Sent: 08/03/2013   5:18 PM           To: Rosalio MacadamiaLori C Gerhardt, NP            Yes normal check. Battery longevity 24 months.            Belenda CruiseKristin            ----- Message -----         From: Rosalio MacadamiaLori C Gerhardt, NP         Sent: 08/03/2013  10:03 AM           To: Donalynn FurlongKristin McNeill            Can you tell me if his last check from last week was ok?            lori             ------

## 2013-08-04 NOTE — Telephone Encounter (Signed)
Pt's wife aware pt has 2 years left on device

## 2013-08-17 ENCOUNTER — Other Ambulatory Visit: Payer: Self-pay | Admitting: Orthopedic Surgery

## 2013-08-17 DIAGNOSIS — M25551 Pain in right hip: Secondary | ICD-10-CM

## 2013-08-19 ENCOUNTER — Ambulatory Visit
Admission: RE | Admit: 2013-08-19 | Discharge: 2013-08-19 | Disposition: A | Discharge status: Home / Self Care | Payer: Medicare Other | Source: Ambulatory Visit | Attending: Orthopedic Surgery | Admitting: Orthopedic Surgery

## 2013-08-19 DIAGNOSIS — M25551 Pain in right hip: Secondary | ICD-10-CM

## 2013-08-24 ENCOUNTER — Encounter: Payer: Self-pay | Admitting: Nurse Practitioner

## 2013-08-24 ENCOUNTER — Ambulatory Visit (INDEPENDENT_AMBULATORY_CARE_PROVIDER_SITE_OTHER): Payer: Medicare Other | Admitting: Nurse Practitioner

## 2013-08-24 VITALS — BP 122/62 | HR 68 | Ht 72.0 in | Wt 220.6 lb

## 2013-08-24 DIAGNOSIS — I10 Essential (primary) hypertension: Secondary | ICD-10-CM

## 2013-08-24 NOTE — Patient Instructions (Addendum)
I think you are doing well  Stay on your current medicines.  See Dr. Graciela HusbandsKlein in June as planned  Call the [facility] office at [phone] if you have any questions, problems or concerns.

## 2013-08-24 NOTE — Progress Notes (Signed)
[name] [name] Date of Birth: February 08, 1946 Medical Record #[id number]  History of Present Illness: [name] [name] is seen back today for a 3 week check. Seen for [name] [name]. 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   Seen here in September of 2014 for pre op visit/surgical clearance. Myoview ordered - low risk. Also noted to have a lung nodule and CT was ordered to further define. This was negative.   Seen by me 3 weeks ago. BP was up. Using too much salt. Had had his losartan increased - then Norvasc added at 2.5 mg a day. BP was some better but still high in the am. I increased his Norvasc to BID.  Comes back today. Here with his [name]. Doing ok. Cardiac status is stable. Can't hardly walk due to an apparent hip fracture above where he has had his hip replacement. [name] [name] has arranged for CT and bone scan with further disposition. BP is doing well at home. No chest pain. Not short of breath. Restricting his salt - some. [name] [name] is happy with how he is doing. BP cuff correlates nicely.   Current Outpatient Prescriptions  Medication Sig Dispense Refill  . albuterol (PROVENTIL HFA;VENTOLIN HFA) 108 (90 BASE) MCG/ACT inhaler Inhale 2 puffs into the lungs 2 (two) times daily as needed.       [name] Kitchen amLODipine (NORVASC) 2.5 MG tablet Take 1 tablet (2.5 mg total) by mouth 2 (two) times daily.  60 tablet  6  . aspirin 81 MG tablet Take 81 mg by mouth daily.      [name] Kitchen atorvastatin (LIPITOR) 40 MG tablet Take 40 mg by mouth daily.      . B Complex Vitamins (B COMPLEX-B12) TABS Take 1 tablet by mouth daily.       . budesonide-formoterol (SYMBICORT) 160-4.5 MCG/ACT inhaler Inhale 2 puffs into the lungs 2 (two) times daily.      [name] Kitchen docusate sodium ([name]) 100 MG capsule Take 100 mg by mouth daily.      [name] Kitchen gabapentin (NEURONTIN) 100 MG capsule Take 100 mg by mouth 4 (four) times daily as needed (for pain).      [name] Kitchen  HYDROcodone-acetaminophen (NORCO) 10-325 MG per tablet Take 1 tablet by mouth every 8 (eight) hours as needed.       [name] Kitchen losartan (COZAAR) 50 MG tablet Take 100 mg by mouth daily.       . Magnesium 250 MG TABS Take 1 tablet by mouth daily.      . Omega 3 1200 MG CAPS Take 1 capsule by mouth daily.      [name] Kitchen OVER THE COUNTER MEDICATION Take 1-2 tablets by mouth daily. [employer] brand of allergy relief      . triamterene-hydrochlorothiazide (MAXZIDE-25) 37.5-25 MG per tablet Take 1 tablet by mouth daily.  90 tablet  3  . vitamin C (ASCORBIC ACID) 500 MG tablet Take 500 mg by mouth daily.      . vitamin E 400 UNIT capsule Take 400 Units by mouth daily.         No current facility-administered medications for this visit.    Allergies  Allergen Reactions  . Penicillins Anaphylaxis  . Metoprolol Tartrate     HR drops too low  . Codeine Rash    Past Medical History  Diagnosis Date  . Hypertension   . Headache(784.0)   . Bradycardia     pacemaker -  Medtronic Adapta #ADDRO1 - December 22, 2005  . Hyperlipidemia   . Depression   . Insomnia   . Chronic back pain   . Anginal pain   . Dysrhythmia     pt states I have when they check the pacemaker,  . COPD (chronic obstructive pulmonary disease)   . Pneumonia     had two times unsure of when  . Diabetes mellitus     type II controled with diet  . Pacemaker     Medtronic Dr. Graciela HusbandsKlein   . GERD (gastroesophageal reflux disease)   . Arthritis   . Neuromuscular disorder     peripheral neuropathy BLE  . Hx of cardiovascular stress test     Lexiscan Myoview (9/14):  Low risk, small mild basal to mid inferior perfusion defect (suspected diaphragmatic attenuation), no ischemia, EF 57%    Past Surgical History  Procedure Laterality Date  . Back surgery    . Hemorrhoid surgery    . Pacemaker insertion    . Insert / replace / remove pacemaker    . Total hip arthroplasty Right 01/25/2013    Procedure: TOTAL HIP ARTHROPLASTY ANTERIOR APPROACH;   Surgeon: [name] OchsPeter G Dalldorf, MD;  Location: [facility] OR;  Service: Orthopedics;  Laterality: Right;    History  Smoking status  . Former Smoker -- 2.00 packs/day for 15 years  Smokeless tobacco  . Not on file    History  Alcohol Use  . Yes    Comment: beer occ    Family History  Problem Relation Age of Onset  . Heart disease Mother     Review of Systems: The review of systems is per the HPI.  All other systems were reviewed and are negative.  Physical Exam: BP 122/62  Pulse 68  Ht 6' (1.829 m)  Wt 220 lb 9.6 oz (100.064 kg)  BMI 29.91 kg/m2 Patient is very pleasant and in no acute distress. Skin is warm and dry. Color is normal.  HEENT is unremarkable. Normocephalic/atraumatic. PERRL. Sclera are nonicteric. Neck is supple. No masses. No JVD. Lungs are clear. Cardiac exam shows a regular rate and rhythm. Abdomen is soft. Extremities are without edema. Gait and ROM are intact. No gross neurologic deficits noted.  LABORATORY DATA: Lab Results  Component Value Date   WBC 11.0* 01/27/2013   HGB 12.0* 01/27/2013   HCT 35.5* 01/27/2013   PLT 187 01/27/2013   GLUCOSE 121* 01/26/2013   NA 136 01/26/2013   K 3.5 01/26/2013   CL 98 01/26/2013   CREATININE 1.14 01/26/2013   BUN 11 01/26/2013   CO2 27 01/26/2013   INR 0.91 01/17/2013   Myoview Impression from September 2014  Exercise Capacity: Lexiscan with no exercise.  BP Response: Normal blood pressure response.  Clinical Symptoms: Shortness of breath  ECG Impression: No significant ST segment change suggestive of ischemia.  Comparison with Prior Nuclear Study: No images to compare  Overall Impression: Low risk stress nuclear study with a small, mild basal to mid inferior perfusion defect. No ischemia. Given normal wall motion, suspect diaphragmatic attenuation. .  LV Ejection Fraction: 57. LV Wall Motion: NL LV Function; NL Wall Motion  [name] [name]  01/24/2013    Assessment / Plan:  1. HTN - Bp greatly improved and at goal. Continue  with current medicines. See back in June as planned.   2. HLD   3. COPD   4. Underlying PPM - followed by Dr. Graciela HusbandsKlein. Apparently nearing ERI. Sees Dr. Graciela HusbandsKlein in June.  Patient is agreeable to this plan and will call if any problems develop in the interim.   Rosalio MacadamiaLori C. Aaron Bostwick, RN, ANP-C  [facility]  [address]  [place], [place] [address]  [phone]

## 2013-08-30 ENCOUNTER — Other Ambulatory Visit (HOSPITAL_COMMUNITY): Payer: Self-pay | Admitting: Orthopedic Surgery

## 2013-08-30 ENCOUNTER — Other Ambulatory Visit (HOSPITAL_COMMUNITY): Payer: Self-pay | Admitting: *Deleted

## 2013-08-30 DIAGNOSIS — M8430XA Stress fracture, unspecified site, initial encounter for fracture: Secondary | ICD-10-CM

## 2013-09-01 ENCOUNTER — Encounter: Payer: Self-pay | Admitting: *Deleted

## 2013-09-07 ENCOUNTER — Ambulatory Visit (HOSPITAL_COMMUNITY)
Admission: RE | Admit: 2013-09-07 | Discharge: 2013-09-07 | Disposition: A | Discharge status: Home / Self Care | Payer: Medicare Other | Source: Ambulatory Visit | Attending: Orthopedic Surgery | Admitting: Orthopedic Surgery

## 2013-09-07 ENCOUNTER — Encounter (HOSPITAL_COMMUNITY): Payer: Self-pay

## 2013-09-07 ENCOUNTER — Ambulatory Visit (HOSPITAL_COMMUNITY): Admission: RE | Admit: 2013-09-07 | Payer: Medicare Other | Source: Ambulatory Visit

## 2013-09-07 DIAGNOSIS — Z96649 Presence of unspecified artificial hip joint: Secondary | ICD-10-CM | POA: Insufficient documentation

## 2013-09-07 DIAGNOSIS — M25559 Pain in unspecified hip: Secondary | ICD-10-CM | POA: Insufficient documentation

## 2013-09-07 DIAGNOSIS — M8430XA Stress fracture, unspecified site, initial encounter for fracture: Secondary | ICD-10-CM

## 2013-09-07 DIAGNOSIS — M949 Disorder of cartilage, unspecified: Principal | ICD-10-CM

## 2013-09-07 DIAGNOSIS — M899 Disorder of bone, unspecified: Secondary | ICD-10-CM | POA: Insufficient documentation

## 2013-09-07 MED ORDER — TECHNETIUM TC 99M MEDRONATE IV KIT
25.2000 | PACK | Freq: Once | INTRAVENOUS | Status: AC | PRN
  Administered 2013-09-07: 25.2 via INTRAVENOUS

## 2013-09-16 ENCOUNTER — Other Ambulatory Visit (HOSPITAL_COMMUNITY): Payer: Self-pay | Admitting: Orthopedic Surgery

## 2013-09-16 DIAGNOSIS — M949 Disorder of cartilage, unspecified: Principal | ICD-10-CM

## 2013-09-16 DIAGNOSIS — M899 Disorder of bone, unspecified: Secondary | ICD-10-CM

## 2013-09-19 ENCOUNTER — Encounter: Payer: Self-pay | Admitting: Internal Medicine

## 2013-09-20 ENCOUNTER — Ambulatory Visit (HOSPITAL_COMMUNITY)
Admission: RE | Admit: 2013-09-20 | Discharge: 2013-09-20 | Disposition: A | Discharge status: Home / Self Care | Payer: Medicare Other | Source: Ambulatory Visit | Attending: Orthopedic Surgery | Admitting: Orthopedic Surgery

## 2013-09-20 DIAGNOSIS — I00 Rheumatic fever without heart involvement: Secondary | ICD-10-CM | POA: Insufficient documentation

## 2013-09-20 DIAGNOSIS — M899 Disorder of bone, unspecified: Secondary | ICD-10-CM

## 2013-09-20 DIAGNOSIS — M949 Disorder of cartilage, unspecified: Secondary | ICD-10-CM

## 2013-09-20 DIAGNOSIS — Z13820 Encounter for screening for osteoporosis: Secondary | ICD-10-CM | POA: Insufficient documentation

## 2013-09-21 ENCOUNTER — Other Ambulatory Visit: Payer: Self-pay | Admitting: Internal Medicine

## 2013-09-21 ENCOUNTER — Ambulatory Visit (HOSPITAL_COMMUNITY): Payer: Medicare Other

## 2013-11-01 ENCOUNTER — Ambulatory Visit (INDEPENDENT_AMBULATORY_CARE_PROVIDER_SITE_OTHER): Payer: Medicare Other | Admitting: Internal Medicine

## 2013-11-01 ENCOUNTER — Encounter: Payer: Self-pay | Admitting: Internal Medicine

## 2013-11-01 VITALS — BP 146/77 | HR 79 | Ht 72.0 in | Wt 228.0 lb

## 2013-11-01 DIAGNOSIS — I495 Sick sinus syndrome: Secondary | ICD-10-CM

## 2013-11-01 DIAGNOSIS — I498 Other specified cardiac arrhythmias: Secondary | ICD-10-CM

## 2013-11-01 DIAGNOSIS — Z950 Presence of cardiac pacemaker: Secondary | ICD-10-CM

## 2013-11-01 LAB — MDC_IDC_ENUM_SESS_TYPE_INCLINIC
Battery Impedance: 2385 Ohm
Battery Remaining Longevity: 21 mo
Battery Voltage: 2.72 V
Brady Statistic AP VP Percent: 0 %
Lead Channel Impedance Value: 444 Ohm
Lead Channel Pacing Threshold Amplitude: 0.75 V
Lead Channel Pacing Threshold Pulse Width: 0.4 ms
Lead Channel Sensing Intrinsic Amplitude: 2.8 mV
Lead Channel Sensing Intrinsic Amplitude: 4 mV
Lead Channel Setting Pacing Amplitude: 2 V
Lead Channel Setting Pacing Amplitude: 2.5 V
Lead Channel Setting Pacing Pulse Width: 0.4 ms
MDC IDC MSMT LEADCHNL RA PACING THRESHOLD PULSEWIDTH: 0.4 ms
MDC IDC MSMT LEADCHNL RV IMPEDANCE VALUE: 567 Ohm
MDC IDC MSMT LEADCHNL RV PACING THRESHOLD AMPLITUDE: 1 V
MDC IDC SESS DTM: 20150630141027
MDC IDC SET LEADCHNL RV SENSING SENSITIVITY: 2 mV
MDC IDC STAT BRADY AP VS PERCENT: 56 %
MDC IDC STAT BRADY AS VP PERCENT: 0 %
MDC IDC STAT BRADY AS VS PERCENT: 44 %

## 2013-11-01 NOTE — Patient Instructions (Signed)
Your physician recommends that you continue on your current medications as directed. Please refer to the Current Medication list given to you today.  Remote monitoring is used to monitor your Pacemaker of ICD from home. This monitoring reduces the number of office visits required to check your device to one time per year. It allows us to keep an eye on the functioning of your device to ensure it is working properly. You are scheduled for a device check from home on 02/02/14. You may send your transmission at any time that day. If you have a wireless device, the transmission will be sent automatically. After your physician reviews your transmission, you will receive a postcard with your next transmission date.  Your physician wants you to follow-up in: 1 year with Dr. Klein.  You will receive a reminder letter in the mail two months in advance. If you don't receive a letter, please call our office to schedule the follow-up appointment.  

## 2013-11-01 NOTE — Progress Notes (Signed)
Patient Care Team: Johny BlamerWilliam Harris, MD as PCP - General (Family Medicine)   HPI  Erick ColaceMarion C Ederer is a 68 y.o. male Seen in followup for pacemaker implanted for chronotropic incompetence and sinus node dysfunction by Dr. Erie NoeJE.  He has no major complaints of shortness of breath or exercise intolerance that are not related to his hip. He underwent hip replacement surgery has had significant problems since that time; he is he is seeing other physicians try to get some help unrelated  He has a history of diabetes hypertension and COPD. Myoview 9/14 was low risk.  Past Medical History  Diagnosis Date  . Hypertension   . Headache(784.0)   . Bradycardia     pacemaker - Medtronic Adapta #ADDRO1 - December 22, 2005  . Hyperlipidemia   . Depression   . Insomnia   . Chronic back pain   . Anginal pain   . Dysrhythmia     pt states I have when they check the pacemaker,  . COPD (chronic obstructive pulmonary disease)   . Pneumonia     had two times unsure of when  . Diabetes mellitus     type II controled with diet  . Pacemaker     Medtronic Dr. Graciela HusbandsKlein   . GERD (gastroesophageal reflux disease)   . Arthritis   . Neuromuscular disorder     peripheral neuropathy BLE  . Hx of cardiovascular stress test     Lexiscan Myoview (9/14):  Low risk, small mild basal to mid inferior perfusion defect (suspected diaphragmatic attenuation), no ischemia, EF 57%    Past Surgical History  Procedure Laterality Date  . Back surgery    . Hemorrhoid surgery    . Pacemaker insertion    . Insert / replace / remove pacemaker    . Total hip arthroplasty Right 01/25/2013    Procedure: TOTAL HIP ARTHROPLASTY ANTERIOR APPROACH;  Surgeon: Velna OchsPeter G Dalldorf, MD;  Location: [facility] OR;  Service: Orthopedics;  Laterality: Right;    Current Outpatient Prescriptions  Medication Sig Dispense Refill  . albuterol (PROVENTIL HFA;VENTOLIN HFA) 108 (90 BASE) MCG/ACT inhaler Inhale 2 puffs into the lungs 2 (two) times  daily as needed.       [name]. amLODipine (NORVASC) 2.5 MG tablet Take 1 tablet (2.5 mg total) by mouth 2 (two) times daily.  60 tablet  6  . aspirin 81 MG tablet Take 81 mg by mouth daily.      [name]. atorvastatin (LIPITOR) 40 MG tablet Take 40 mg by mouth daily.      . B Complex Vitamins (B COMPLEX-B12) TABS Take 1 tablet by mouth daily.       . budesonide-formoterol (SYMBICORT) 160-4.5 MCG/ACT inhaler Inhale 2 puffs into the lungs 2 (two) times daily.      [name]. CALCIUM PO Take 1,300 mg by mouth daily.      . Cholecalciferol (HM VITAMIN D3) 4000 UNITS CAPS Take by mouth daily.      [name]. docusate sodium (COLACE) 100 MG capsule Take 100 mg by mouth daily.      [name]. gabapentin (NEURONTIN) 100 MG capsule Take 100 mg by mouth 4 (four) times daily as needed (for pain).      [name]. HYDROcodone-acetaminophen (NORCO) 10-325 MG per tablet Take 1 tablet by mouth every 8 (eight) hours as needed.       . ibandronate (BONIVA) 150 MG tablet Take 150 mg by mouth every 30 (thirty) days. Take in the morning with a full  glass of water, on an empty stomach, and do not take anything else by mouth or lie down for the next 30 min.      [name]. losartan (COZAAR) 50 MG tablet Take 100 mg by mouth daily.       . Magnesium 250 MG TABS Take 1 tablet by mouth daily.      . Omega 3 1200 MG CAPS Take 1 capsule by mouth daily.      [name]. OVER THE COUNTER MEDICATION Take 1-2 tablets by mouth daily. [employer] brand of allergy relief      . simvastatin (ZOCOR) 10 MG tablet Take 10 mg by mouth daily.      [name]. triamterene-hydrochlorothiazide (MAXZIDE-25) 37.5-25 MG per tablet TAKE ONE TABLET BY MOUTH ONCE DAILY  90 tablet  0  . vitamin C (ASCORBIC ACID) 500 MG tablet Take 500 mg by mouth daily.      . vitamin E 400 UNIT capsule Take 400 Units by mouth daily.         No current facility-administered medications for this visit.    Allergies  Allergen Reactions  . Penicillins Anaphylaxis  . Metoprolol Tartrate     HR drops too low  . Codeine Rash    Review of  Systems negative except from HPI and PMH  Physical Exam BP 146/77  Pulse 79  Ht 6' (1.829 m)  Wt 228 lb (103.42 kg)  BMI 30.92 kg/m2 Well developed and well nourished in no acute distress HENT normal E scleral and icterus clear Neck Supple JVP flat; carotids brisk and full Clear to ausculation  Regular rate and rhythm, +S4Soft with active bowel sounds No clubbing cyanosis  Edema Alert and oriented, grossly normal motor and sensory function Skin Warm and Dry    Assessment and  Plan  Sinus node dysfunction  Pacemaker Medtronic  Hypertension   Overall cardiac status is stable. Pacemaker function was normal. Approaching ERI. Estimated 21 months. We will see him in one year.  Is no evidence of volume overload.

## 2013-11-11 ENCOUNTER — Ambulatory Visit (INDEPENDENT_AMBULATORY_CARE_PROVIDER_SITE_OTHER): Payer: Medicare Other | Admitting: Ophthalmology

## 2013-11-14 ENCOUNTER — Other Ambulatory Visit: Payer: Self-pay | Admitting: Internal Medicine

## 2014-01-18 DIAGNOSIS — M25551 Pain in right hip: Secondary | ICD-10-CM | POA: Insufficient documentation

## 2014-02-02 ENCOUNTER — Ambulatory Visit (INDEPENDENT_AMBULATORY_CARE_PROVIDER_SITE_OTHER): Payer: Medicare Other | Admitting: *Deleted

## 2014-02-02 ENCOUNTER — Telehealth: Payer: Self-pay | Admitting: Cardiology

## 2014-02-02 DIAGNOSIS — I495 Sick sinus syndrome: Secondary | ICD-10-CM

## 2014-02-02 NOTE — Telephone Encounter (Signed)
Attempted to call pt x2 to confirm remote transmission for today. No answer and unable to leave a message.

## 2014-02-02 NOTE — Progress Notes (Signed)
Remote pacemaker transmission.   

## 2014-02-03 LAB — MDC_IDC_ENUM_SESS_TYPE_REMOTE
Brady Statistic AP VP Percent: 1 %
Brady Statistic AS VP Percent: 0 %
Brady Statistic AS VS Percent: 31 %
Date Time Interrogation Session: 20151001172617
Lead Channel Impedance Value: 591 Ohm
Lead Channel Pacing Threshold Amplitude: 0.875 V
Lead Channel Pacing Threshold Amplitude: 1.125 V
Lead Channel Sensing Intrinsic Amplitude: 2.8 mV
Lead Channel Sensing Intrinsic Amplitude: 5.6 mV
Lead Channel Setting Pacing Amplitude: 2 V
MDC IDC MSMT BATTERY IMPEDANCE: 2764 Ohm
MDC IDC MSMT BATTERY REMAINING LONGEVITY: 18 mo
MDC IDC MSMT BATTERY VOLTAGE: 2.71 V
MDC IDC MSMT LEADCHNL RA IMPEDANCE VALUE: 487 Ohm
MDC IDC MSMT LEADCHNL RA PACING THRESHOLD PULSEWIDTH: 0.4 ms
MDC IDC MSMT LEADCHNL RV PACING THRESHOLD PULSEWIDTH: 0.4 ms
MDC IDC SET LEADCHNL RV PACING AMPLITUDE: 2.5 V
MDC IDC SET LEADCHNL RV PACING PULSEWIDTH: 0.4 ms
MDC IDC SET LEADCHNL RV SENSING SENSITIVITY: 2.8 mV
MDC IDC STAT BRADY AP VS PERCENT: 68 %

## 2014-02-21 ENCOUNTER — Encounter: Payer: Self-pay | Admitting: Cardiology

## 2014-03-09 ENCOUNTER — Encounter: Payer: Self-pay | Admitting: Internal Medicine

## 2014-03-16 ENCOUNTER — Telehealth: Payer: Self-pay | Admitting: Internal Medicine

## 2014-03-16 ENCOUNTER — Encounter: Payer: Self-pay | Admitting: Internal Medicine

## 2014-03-16 ENCOUNTER — Ambulatory Visit (INDEPENDENT_AMBULATORY_CARE_PROVIDER_SITE_OTHER)
Admission: RE | Admit: 2014-03-16 | Discharge: 2014-03-16 | Disposition: A | Discharge status: Home / Self Care | Payer: Medicare Other | Source: Ambulatory Visit | Attending: Internal Medicine | Admitting: Internal Medicine

## 2014-03-16 ENCOUNTER — Ambulatory Visit (INDEPENDENT_AMBULATORY_CARE_PROVIDER_SITE_OTHER): Payer: Medicare Other | Admitting: Internal Medicine

## 2014-03-16 VITALS — BP 130/80 | HR 72 | Temp 97.8°F | Ht 72.0 in | Wt 229.8 lb

## 2014-03-16 DIAGNOSIS — R030 Elevated blood-pressure reading, without diagnosis of hypertension: Secondary | ICD-10-CM

## 2014-03-16 DIAGNOSIS — R058 Other specified cough: Secondary | ICD-10-CM | POA: Insufficient documentation

## 2014-03-16 DIAGNOSIS — R0982 Postnasal drip: Secondary | ICD-10-CM | POA: Insufficient documentation

## 2014-03-16 DIAGNOSIS — R05 Cough: Secondary | ICD-10-CM

## 2014-03-16 DIAGNOSIS — IMO0001 Reserved for inherently not codable concepts without codable children: Secondary | ICD-10-CM

## 2014-03-16 MED ORDER — FAMOTIDINE 20 MG PO TABS: ORAL_TABLET | ORAL | Status: DC

## 2014-03-16 MED ORDER — MEPERIDINE HCL 50 MG PO TABS: ORAL_TABLET | ORAL | Status: DC

## 2014-03-16 MED ORDER — CLONIDINE HCL 0.1 MG PO TABS: 0.1000 mg | ORAL_TABLET | Freq: Two times a day (BID) | ORAL | Status: DC

## 2014-03-16 MED ORDER — PANTOPRAZOLE SODIUM 40 MG PO TBEC: 40.0000 mg | DELAYED_RELEASE_TABLET | Freq: Every day | ORAL | Status: DC

## 2014-03-16 NOTE — Patient Instructions (Addendum)
Stop fish oil, vit E cozar, symbicort and all the sprays  Start clonidine 0.1 twice daily (double up if needed if blood pressure is too high)  Finish you prednisone   The key to effective treatment for your cough is eliminating the non-stop cycle of cough you're stuck in long enough to let your airway heal completely and then see if there is anything still making you cough once you stop the cough suppression, but this should take no more than 5 days to figure out  First take delsym two tsp every 12 hours and supplement if needed with demerol l 50 mg up to 1-2 every 4 hours to suppress the urge to cough at all or even clear your throat. Swallowing water or using ice chips/non mint and menthol containing candies (such as lifesavers or sugarless jolly ranchers) are also effective.  You should rest your voice and avoid activities that you know make you cough.  Once you have eliminated the cough for 3 straight days try reducing the demerol first,  then the delsym as tolerated.     Protonix (pantoprazole) Take 30-60 min before first meal of the day and Pepcid 20 mg one bedtime plus chlorpheniramine 4 mg x 2 at bedtime (both available over the counter)  until cough is completely gone for at least a week without the need for cough suppression  GERD (REFLUX)  is an extremely common cause of respiratory symptoms, many times with no significant heartburn at all.    It can be treated with medication, but also with lifestyle changes including avoidance of late meals, excessive alcohol, smoking cessation, and avoid fatty foods, chocolate, peppermint, colas, red wine, and acidic juices such as orange juice.  NO MINT OR MENTHOL PRODUCTS SO NO COUGH DROPS  USE HARD CANDY INSTEAD (jolley ranchers or Stover's or Lifesavers (all available in sugarless versions) NO OIL BASED VITAMINS - use powdered substitutes.  If your breathing still bothers you then use the nebulizer up to every 4 hours   Please remember to go  to the x-ray department downstairs for your tests - we will call you with the results when they are available.    If not better in a week please return with all medications / nebulizer  solution and anything you are using

## 2014-03-16 NOTE — Telephone Encounter (Signed)
Called and spoke pt's daughter. [name] stated the demerol that MW scripted is not covered by insurance. Daughter is requesting an alternative. Daughter is aware that MW is not in the office until Monday 11/16 and for pt to seek emergency care if needed.   MW please advise on alternative for the Demerol.   Allergies  Allergen Reactions  . Penicillins Anaphylaxis  . Metoprolol Tartrate     HR drops too low  . Codeine Rash

## 2014-03-16 NOTE — Progress Notes (Signed)
   Subjective:    Patient ID: Erick ColaceMarion C Mohamud, male    DOB: 01/30/1946, MRN: [id number]  HPI  [age] yowm quit smoking 1972 referred by Dr Cliffton AstersWhite for eval of recurrent cough x 2011 worse in fall and then again in spring does seem to respond to prednisone typically but lasts a month or so but having flare since late Oct 2015  not really better despite at least 2 rounds of prednisone and abx with persistent dry cough worse at hs so referred for pulmonary eval by Dr Jesse FallWhite/ [facility]  03/16/2014 1st [facility] office visit/ Pamelyn Bancroft   Chief Complaint  Patient presents with  . Pulmonary Consult    Referred by Dr. Laurann Montanaynthia White. Pt needs pulmonary clearance for hip relacement surgery (rt).  Pt c/o cough on and off x 4 wks. He states that the cough is esp worse at night. He also has been feeling SOB- notices "all the time, b/c coughing all the time".  usually not sob unless coughing. No better on symbicort   No obvious other patterns in day to day or daytime variabilty or assoc excess or purulent sputum  or cp or chest tightness, subjective wheeze overt sinus or hb symptoms. No unusual exp hx or h/o childhood pna/ asthma or knowledge of premature birth.  Sleeping ok without nocturnal  or early am exacerbation  of respiratory  c/o's or need for noct saba. Also denies any obvious fluctuation of symptoms with weather or environmental changes or other aggravating or alleviating factors except as outlined above   Current Medications, Allergies, Complete Past Medical History, Past Surgical History, Family History, and Social History were reviewed in [facility] electronic medical record.             Review of Systems  Constitutional: Negative for fever, chills, activity change, appetite change and unexpected weight change.  HENT: Negative for congestion, dental problem, postnasal drip, rhinorrhea, sneezing, sore throat, trouble swallowing and voice change.   Eyes: Negative for visual disturbance.    Respiratory: Positive for cough and shortness of breath. Negative for choking.   Cardiovascular: Negative for chest pain and leg swelling.  Gastrointestinal: Negative for nausea, vomiting and abdominal pain.  Genitourinary: Negative for difficulty urinating.  Musculoskeletal: Negative for arthralgias.  Skin: Negative for rash.  Psychiatric/Behavioral: Negative for behavioral problems and confusion.       Objective:   Physical Exam  Hoarse amb wm with harsh barking upper airway cough  Wt Readings from Last 3 Encounters:  03/16/14 229 lb 12.8 oz (104.237 kg)  11/01/13 228 lb (103.42 kg)  08/24/13 220 lb 9.6 oz (100.064 kg)    Vital signs reviewed  HEENT: endentulout, nl turbinates, and orophanx. Nl external ear canals without cough reflex   NECK :  without JVD/Nodes/TM/ nl carotid upstrokes bilaterally   LUNGS: no acc muscle use, clear to A and P bilaterally without cough on insp or exp maneuvers   CV:  RRR  no s3 or murmur or increase in P2, no edema   ABD:  soft and nontender with nl excursion in the supine position. No bruits or organomegaly, bowel sounds nl  MS:  warm without deformities, calf tenderness, cyanosis or clubbing  SKIN: warm and dry without lesions    NEURO:  alert, approp, no deficits      CXR  03/16/2014 :  No active cardiopulmonary disease.     Assessment & Plan:

## 2014-03-17 ENCOUNTER — Telehealth: Payer: Self-pay | Admitting: Internal Medicine

## 2014-03-17 NOTE — Telephone Encounter (Signed)
I spoke with the pt's daughter Zella BallRobin and notified of recs per MW  She verbalized understanding  Nothing further needed

## 2014-03-17 NOTE — Telephone Encounter (Signed)
Unfortunately he's likely allergic to the alternatives. I rec he get what he can afford (like say #12) pay cash and check back in on 11/16

## 2014-03-19 NOTE — Assessment & Plan Note (Signed)
The most common causes of chronic cough in immunocompetent adults include the following: upper airway cough syndrome (UACS), previously referred to as postnasal drip syndrome (PNDS), which is caused by variety of rhinosinus conditions; (2) asthma; (3) GERD; (4) chronic bronchitis from cigarette smoking or other inhaled environmental irritants; (5) nonasthmatic eosinophilic bronchitis; and (6) bronchiectasis.   These conditions, singly or in combination, have accounted for up to 94% of the causes of chronic cough in prospective studies.   Other conditions have constituted no >6% of the causes in prospective studies These have included bronchogenic carcinoma, chronic interstitial pneumonia, sarcoidosis, left ventricular failure, ACEI-induced cough, and aspiration from a condition associated with pharyngeal dysfunction.    Chronic cough is often simultaneously caused by more than one condition. A single cause has been found from 38 to 82% of the time, multiple causes from 18 to 62%. Multiply caused cough has been the result of three diseases up to 42% of the time.       Based on hx and exam, this is most likely:  Classic Upper airway cough syndrome, so named because it's frequently impossible to sort out how much is  CR/sinusitis with freq throat clearing (which can be related to primary GERD)   vs  causing  secondary (" extra esophageal")  GERD from wide swings in gastric pressure that occur with throat clearing, often  promoting self use of mint and menthol lozenges that reduce the lower esophageal sphincter tone and exacerbate the problem further in a cyclical fashion.   These are the same pts (now being labeled as having "irritable larynx syndrome" by some cough centers) who not infrequently have a history of having failed to tolerate ace inhibitors*,  dry powder inhalers or biphosphonates or report having atypical reflux symptoms that don't respond to standard doses of PPI , and are easily confused as  having aecopd or asthma flares by even experienced allergists/ pulmonologists.   The first step is to maximize acid suppression and eliminate cyclical coughing   *Also needs a trial off arb:  For reasons that may related to vascular permability and nitric oxide pathways but not elevated  bradykinin levels (as seen with  ACEi use) losartan in the generic form has been reported now from mulitple sources  to cause a similar pattern of non-specific  upper airway symptoms as seen with acei.   This has not been reported with exposure to the other ARB's to date, so it seems reasonable for now to try either generic diovan or avapro if ARB needed or use an alternative class altogether.  See:  Dewayne HatchAnn Allergy Asthma Immunol  2008: 101: p [phone]

## 2014-03-19 NOTE — Assessment & Plan Note (Signed)
Try clonidine 0.1 bid in place of cozar preop then ok to rechallenge post op if cough ok control

## 2014-03-20 NOTE — Telephone Encounter (Signed)
Called and spoke with pt and he stated that he has taken the hydrocodone without any problems in the past.  He stated that he did take the tablets that he had at home and he is about 75% better today but would like to know if the hydrocodone can be prescribed in case he needs this.  MW please advise. Thanks  Allergies  Allergen Reactions  . Penicillins Anaphylaxis  . Metoprolol Tartrate     HR drops too low  . Codeine Rash    Current Outpatient Prescriptions on File Prior to Visit  Medication Sig Dispense Refill  . amLODipine (NORVASC) 2.5 MG tablet Take 1 tablet (2.5 mg total) by mouth 2 (two) times daily. 60 tablet 6  . aspirin 81 MG tablet Take 81 mg by mouth daily.    [name]. atorvastatin (LIPITOR) 40 MG tablet Take 40 mg by mouth daily.    . B Complex Vitamins (B COMPLEX-B12) TABS Take 1 tablet by mouth daily.     [name]. CALCIUM PO Take 1,300 mg by mouth daily.    . Cholecalciferol (HM VITAMIN D3) 4000 UNITS CAPS Take by mouth daily.    . cloNIDine (CATAPRES) 0.1 MG tablet Take 1 tablet (0.1 mg total) by mouth 2 (two) times daily. 60 tablet 11  . docusate sodium (COLACE) 100 MG capsule Take 100 mg by mouth daily.    . famotidine (PEPCID) 20 MG tablet One at bedtime 30 tablet 2  . gabapentin (NEURONTIN) 100 MG capsule Take 100 mg by mouth 4 (four) times daily as needed (for pain).    [name]. ipratropium (ATROVENT) 0.02 % nebulizer solution Take 0.5 mg by nebulization every 6 (six) hours as needed for wheezing or shortness of breath.    . Magnesium 250 MG TABS Take 1 tablet by mouth daily.    . meperidine (DEMEROL) 50 MG tablet One - two every 4 hours for cough or pain 30 tablet 0  . pantoprazole (PROTONIX) 40 MG tablet Take 1 tablet (40 mg total) by mouth daily. Take 30-60 min before first meal of the day 30 tablet 2  . simvastatin (ZOCOR) 10 MG tablet Take 10 mg by mouth daily.    [name]. triamterene-hydrochlorothiazide (MAXZIDE-25) 37.5-25 MG per tablet TAKE ONE TABLET BY MOUTH ONCE DAILY 90 tablet 1  .  vitamin C (ASCORBIC ACID) 500 MG tablet Take 500 mg by mouth daily.     No current facility-administered medications on file prior to visit.

## 2014-03-20 NOTE — Telephone Encounter (Signed)
[name] CowdenMichael B Wert, MD at 03/17/2014 3:51 PM     Status: Signed       Expand All Collapse All   Unfortunately he's likely allergic to the alternatives. I rec he get what he can afford (like say #12) pay cash and check back in on 11/16     lmtcb for pt.   See phone note from 11/12.

## 2014-03-20 NOTE — Telephone Encounter (Signed)
Pt returning call.[name] [name] ° °

## 2014-03-20 NOTE — Telephone Encounter (Signed)
Spoke with the pt  He states that he is out of hydrocodone and threw away the bottle, so unsure of what strength it was  He states "it was pretty strong" He is asking for new rx  Please advise, thanks

## 2014-03-20 NOTE — Telephone Encounter (Signed)
Patient calling to give update on cough.  [phone]

## 2014-03-20 NOTE — Telephone Encounter (Signed)
Pt going out  And would like a call back on this # [phone].Caren GriffinsStanley A Dalton

## 2014-03-20 NOTE — Telephone Encounter (Signed)
That story doesn't make any sense but ok with me to give vicoprofen 7.5 #30 one q 4h prn cough

## 2014-03-20 NOTE — Telephone Encounter (Signed)
Yes that's fine just tell us the strength and take it every 4 hours until not coughing at all

## 2014-03-21 NOTE — Telephone Encounter (Signed)
Pt returned call. Informed pt of the recs per MW. Pt stated he is unsure if his insurance will cover the vicoprofen. Advised pt that we cannot keep going back and forth with the medications and what is covered and what isnt. Pt stated he will call and see if Vicoprofen is covered before coming to pick up the medication. Pt stated Hydromorphone and Oxycodone are covered. Will await pt's call back to see if Vicoprofen is covered.

## 2014-03-21 NOTE — Telephone Encounter (Signed)
If he's all better he's cleared for surgery, if he's not then he needs to follow the instructions at the ov to the letter if he wants me to help him and clear him (says return in one week with all meds if not better)

## 2014-03-21 NOTE — Telephone Encounter (Signed)
lmtcb for pt.  

## 2014-03-21 NOTE — Telephone Encounter (Signed)
Pt calling stating that ins. Will cove the vicoprofen in the generic  Please let pt know when prescript is ready for pick up.Caren GriffinsStanley A Dalton

## 2014-03-21 NOTE — Telephone Encounter (Signed)
I spoke to the pt and he states that he is all better. Cough is gone and he is not having any SOB. Please advise on surgical clearance and I will fax this phone note. [name] CurieJennifer Markie Heffernan, CMA

## 2014-03-21 NOTE — Telephone Encounter (Signed)
Ok to clear for surgery from pulmonary perspective 

## 2014-03-21 NOTE — Telephone Encounter (Signed)
Pt daughter calling stating that pt doesn't need any more refills, says that they just want to know if pt is ok for surgery on the 24th  Daughter can be reached @ [phone] her name is robin.Caren GriffinsStanley A Dalton

## 2014-03-21 NOTE — Telephone Encounter (Signed)
lmomtcb x1 

## 2014-03-21 NOTE — Telephone Encounter (Signed)
Please advise Dr Sherene SiresWert if the patient is cleared for hip surgery 03/28/14 Pt states that Dr Mcarthur Rossettianiel J Delgaizo needs letter of surgical clearance or recent "notes" stating this clearance.

## 2014-03-22 NOTE — Telephone Encounter (Signed)
Called and spoke to pt. Informed pt of the clearance. Pt also stated he no longer needs the vicoprofen.  Fax: [phone] for Dr. Ernesta Ambleelgaizo's office.  Phone note faxed to Dr. Caryl Pinaelgaizo.  Nothing further needed.

## 2014-03-22 NOTE — Telephone Encounter (Signed)
Patient returning call. [phone]

## 2014-04-01 ENCOUNTER — Encounter (HOSPITAL_BASED_OUTPATIENT_CLINIC_OR_DEPARTMENT_OTHER): Payer: Self-pay

## 2014-04-01 ENCOUNTER — Emergency Department (HOSPITAL_BASED_OUTPATIENT_CLINIC_OR_DEPARTMENT_OTHER)
Admission: EM | Admit: 2014-04-01 | Discharge: 2014-04-01 | Disposition: A | Discharge status: Home / Self Care | Payer: Medicare Other | Attending: Emergency Medicine | Admitting: Emergency Medicine

## 2014-04-01 ENCOUNTER — Emergency Department (HOSPITAL_BASED_OUTPATIENT_CLINIC_OR_DEPARTMENT_OTHER): Payer: Medicare Other

## 2014-04-01 DIAGNOSIS — Z87891 Personal history of nicotine dependence: Secondary | ICD-10-CM | POA: Insufficient documentation

## 2014-04-01 DIAGNOSIS — R001 Bradycardia, unspecified: Secondary | ICD-10-CM | POA: Insufficient documentation

## 2014-04-01 DIAGNOSIS — Z8701 Personal history of pneumonia (recurrent): Secondary | ICD-10-CM | POA: Diagnosis not present

## 2014-04-01 DIAGNOSIS — E119 Type 2 diabetes mellitus without complications: Secondary | ICD-10-CM | POA: Diagnosis not present

## 2014-04-01 DIAGNOSIS — E785 Hyperlipidemia, unspecified: Secondary | ICD-10-CM | POA: Insufficient documentation

## 2014-04-01 DIAGNOSIS — K219 Gastro-esophageal reflux disease without esophagitis: Secondary | ICD-10-CM | POA: Diagnosis not present

## 2014-04-01 DIAGNOSIS — G4700 Insomnia, unspecified: Secondary | ICD-10-CM | POA: Diagnosis not present

## 2014-04-01 DIAGNOSIS — R0602 Shortness of breath: Secondary | ICD-10-CM | POA: Diagnosis present

## 2014-04-01 DIAGNOSIS — R059 Cough, unspecified: Secondary | ICD-10-CM

## 2014-04-01 DIAGNOSIS — Z8739 Personal history of other diseases of the musculoskeletal system and connective tissue: Secondary | ICD-10-CM | POA: Insufficient documentation

## 2014-04-01 DIAGNOSIS — Z8659 Personal history of other mental and behavioral disorders: Secondary | ICD-10-CM | POA: Insufficient documentation

## 2014-04-01 DIAGNOSIS — Z7982 Long term (current) use of aspirin: Secondary | ICD-10-CM | POA: Diagnosis not present

## 2014-04-01 DIAGNOSIS — I10 Essential (primary) hypertension: Secondary | ICD-10-CM | POA: Diagnosis not present

## 2014-04-01 DIAGNOSIS — Z880 Allergy status to penicillin: Secondary | ICD-10-CM | POA: Diagnosis not present

## 2014-04-01 DIAGNOSIS — Z950 Presence of cardiac pacemaker: Secondary | ICD-10-CM | POA: Diagnosis not present

## 2014-04-01 DIAGNOSIS — G8929 Other chronic pain: Secondary | ICD-10-CM | POA: Diagnosis not present

## 2014-04-01 DIAGNOSIS — J441 Chronic obstructive pulmonary disease with (acute) exacerbation: Secondary | ICD-10-CM | POA: Diagnosis not present

## 2014-04-01 DIAGNOSIS — Z79899 Other long term (current) drug therapy: Secondary | ICD-10-CM | POA: Insufficient documentation

## 2014-04-01 DIAGNOSIS — R05 Cough: Secondary | ICD-10-CM

## 2014-04-01 DIAGNOSIS — J40 Bronchitis, not specified as acute or chronic: Secondary | ICD-10-CM

## 2014-04-01 MED ORDER — PROMETHAZINE-CODEINE 6.25-10 MG/5ML PO SYRP: 5.0000 mL | ORAL_SOLUTION | ORAL | Status: DC | PRN

## 2014-04-01 NOTE — Discharge Instructions (Signed)
Cough, Adult  A cough is a reflex that helps clear your throat and airways. It can help heal the body or may be a reaction to an irritated airway. A cough may only last 2 or 3 weeks (acute) or may last more than 8 weeks (chronic).  CAUSES Acute cough:  Viral or bacterial infections. Chronic cough:  Infections.  Allergies.  Asthma.  Post-nasal drip.  Smoking.  Heartburn or acid reflux.  Some medicines.  Chronic lung problems (COPD).  Cancer. SYMPTOMS   Cough.  Fever.  Chest pain.  Increased breathing rate.  High-pitched whistling sound when breathing (wheezing).  Colored mucus that you cough up (sputum). TREATMENT   A bacterial cough may be treated with antibiotic medicine.  A viral cough must run its course and will not respond to antibiotics.  Your caregiver may recommend other treatments if you have a chronic cough. HOME CARE INSTRUCTIONS   Only take over-the-counter or prescription medicines for pain, discomfort, or fever as directed by your caregiver. Use cough suppressants only as directed by your caregiver.  Use a cold steam vaporizer or humidifier in your bedroom or home to help loosen secretions.  Sleep in a semi-upright position if your cough is worse at night.  Rest as needed.  Stop smoking if you smoke. SEEK IMMEDIATE MEDICAL CARE IF:   You have pus in your sputum.  Your cough starts to worsen.  You cannot control your cough with suppressants and are losing sleep.  You begin coughing up blood.  You have difficulty breathing.  You develop pain which is getting worse or is uncontrolled with medicine.  You have a fever. MAKE SURE YOU:   Understand these instructions.  Will watch your condition.  Will get help right away if you are not doing well or get worse. Document Released: 10/18/2010 Document Revised: 07/14/2011 Document Reviewed: 10/18/2010 ExitCare Patient Information 2015 [employer]. This information is not intended  to replace advice given to you by your health care provider. Make sure you discuss any questions you have with your health care provider.  

## 2014-04-01 NOTE — ED Provider Notes (Addendum)
CSN: [id number]     Arrival date & time 04/01/14  0132 History   First MD Initiated Contact with Patient 04/01/14 [phone]     Chief Complaint  Patient presents with  . Shortness of Breath     (Consider location/radiation/quality/duration/timing/severity/associated sxs/prior Treatment) HPI  This is a 68 year old male with a history of chronic lung disease. He is followed by Dr. Sherene SiresWert. About 2 months ago he was treated with an antibiotic and steroid taper for acute bronchitis. He had a second steroid course about 2 weeks later. He has had persistent cough and shortness of breath which acutely worsened yesterday. His paroxetine systems of cough are his most distressing symptom, severe enough to prevent him from sleeping. He has used his home nebulizer without adequate relief. He denies chest pain or fever. He denies nausea, vomiting or diarrhea. He is going to have a hip replacement 3 days. He was recently told to discontinue to his Symbicort inhaler.  Past Medical History  Diagnosis Date  . Hypertension   . Headache(784.0)   . Bradycardia     pacemaker - Medtronic Adapta #[id number] - December 22, 2005  . Hyperlipidemia   . Depression   . Insomnia   . Chronic back pain   . Anginal pain   . Dysrhythmia     pt states I have when they check the pacemaker,  . COPD (chronic obstructive pulmonary disease)   . Pneumonia     had two times unsure of when  . Diabetes mellitus     type II controled with diet  . Pacemaker     Medtronic Dr. Graciela HusbandsKlein   . GERD (gastroesophageal reflux disease)   . Arthritis   . Neuromuscular disorder     peripheral neuropathy BLE  . Hx of cardiovascular stress test     Lexiscan Myoview (9/14):  Low risk, small mild basal to mid inferior perfusion defect (suspected diaphragmatic attenuation), no ischemia, EF 57%   Past Surgical History  Procedure Laterality Date  . Back surgery    . Hemorrhoid surgery    . Pacemaker insertion    . Insert / replace / remove pacemaker     . Total hip arthroplasty Right 01/25/2013    Procedure: TOTAL HIP ARTHROPLASTY ANTERIOR APPROACH;  Surgeon: Velna OchsPeter G Dalldorf, MD;  Location: [facility] OR;  Service: Orthopedics;  Laterality: Right;   Family History  Problem Relation Age of Onset  . Heart disease Mother   . Asthma Maternal Aunt    History  Substance Use Topics  . Smoking status: Former Smoker -- 2.00 packs/day for 15 years  . Smokeless tobacco: Never Used  . Alcohol Use: 0.0 oz/week    0 Not specified per week     Comment: beer occ    Review of Systems  All other systems reviewed and are negative.   Allergies  Penicillins; Metoprolol tartrate; and Codeine  Home Medications   Prior to Admission medications   Medication Sig Start Date End Date Taking? Authorizing Provider  amLODipine (NORVASC) 2.5 MG tablet Take 1 tablet (2.5 mg total) by mouth 2 (two) times daily. 08/03/13   Rosalio MacadamiaLori C Gerhardt, NP  aspirin 81 MG tablet Take 81 mg by mouth daily.    Historical Provider, MD  atorvastatin (LIPITOR) 40 MG tablet Take 40 mg by mouth daily.    Historical Provider, MD  B Complex Vitamins (B COMPLEX-B12) TABS Take 1 tablet by mouth daily.     Historical Provider, MD  CALCIUM PO Take 1,300 mg  by mouth daily.    Historical Provider, MD  Cholecalciferol (HM VITAMIN D3) 4000 UNITS CAPS Take by mouth daily.    Historical Provider, MD  cloNIDine (CATAPRES) 0.1 MG tablet Take 1 tablet (0.1 mg total) by mouth 2 (two) times daily. 03/16/14   Nyoka CowdenMichael B Wert, MD  docusate sodium (COLACE) 100 MG capsule Take 100 mg by mouth daily.    Historical Provider, MD  famotidine (PEPCID) 20 MG tablet One at bedtime 03/16/14   Nyoka CowdenMichael B Wert, MD  gabapentin (NEURONTIN) 100 MG capsule Take 100 mg by mouth 4 (four) times daily as needed (for pain).    Historical Provider, MD  ipratropium (ATROVENT) 0.02 % nebulizer solution Take 0.5 mg by nebulization every 6 (six) hours as needed for wheezing or shortness of breath.    Historical Provider, MD  Magnesium  250 MG TABS Take 1 tablet by mouth daily.    Historical Provider, MD  meperidine (DEMEROL) 50 MG tablet One - two every 4 hours for cough or pain 03/16/14   Nyoka CowdenMichael B Wert, MD  pantoprazole (PROTONIX) 40 MG tablet Take 1 tablet (40 mg total) by mouth daily. Take 30-60 min before first meal of the day 03/16/14   Nyoka CowdenMichael B Wert, MD  simvastatin (ZOCOR) 10 MG tablet Take 10 mg by mouth daily.    Historical Provider, MD  triamterene-hydrochlorothiazide (MAXZIDE-25) 37.5-25 MG per tablet TAKE ONE TABLET BY MOUTH ONCE DAILY    Duke SalviaSteven C Klein, MD  vitamin C (ASCORBIC ACID) 500 MG tablet Take 500 mg by mouth daily.    Historical Provider, MD   BP 135/69 mmHg  Pulse 81  Temp(Src) 98.7 F (37.1 C) (Oral)  Resp 18  Ht 6' (1.829 m)  Wt 222 lb (100.699 kg)  BMI 30.10 kg/m2  SpO2 95%   Physical Exam  General: Well-developed, well-nourished male in no acute distress; appearance consistent with age of record HENT: normocephalic; atraumatic Eyes: pupils equal, round and reactive to light; extraocular muscles intact Neck: supple Heart: regular rate and rhythm Lungs: clear to auscultation bilaterally; deep breathing causes paroxysms of cough Abdomen: soft; nondistended; nontender; no masses or hepatosplenomegaly; bowel sounds present Extremities: No deformity; full range of motion; pulses normal Neurologic: Awake, alert; motor function intact in all extremities and symmetric; no facial droop Skin: Warm and dry Psychiatric: Normal mood and affect    ED Course  Procedures (including critical care time)   MDM    EKG Interpretation  Date/Time:  Saturday April 01 2014 01:43:16 EST Ventricular Rate:  86 PR Interval:  184 QRS Duration: 102 QT Interval:  352 QTC Calculation: 421 R Axis:   -34 Text Interpretation:  Normal sinus rhythm Left axis deviation Inferior infarct , age undetermined Abnormal ECG Rate is faster Confirmed by Alethia Melendrez  MD, Jonny RuizJOHN ([id number]) on 04/01/2014 5:18:24 AM       Nursing notes and vitals signs, including pulse oximetry, reviewed.  Summary of this visit's results, reviewed by myself:  Imaging Studies: Dg Chest 2 View  04/01/2014   CLINICAL DATA:  Shortness of breath, nonproductive cough for the past 2 months  EXAM: CHEST  2 VIEW  COMPARISON:  Prior chest x-ray 03/16/2014  FINDINGS: Stable position of left subclavian approach cardiac rhythm maintenance device with leads projecting over the right atrium and right ventricle. Cardiac and mediastinal contours remain within normal limits. Mild vascular congestion without overt edema. No significant interval change. No focal airspace consolidation, pleural effusion or pneumothorax. Mild central bronchitic changes are similar compared  to prior. No acute osseous abnormality.  IMPRESSION: 1. No acute cardiopulmonary process. 2. Stable mild central bronchitic change.   Electronically Signed   By: Malachy Moan M.D.   On: 04/01/2014 02:30   We'll avoid systemic steroids in light of the patient's pending surgery. He was advised to restart his Symbicort and we will treat him with Phenergan and codeine for his cough. Although codeine is listed as an allergy states it causes mild itching but no systemic symptoms. He states his insurance will not pay for medications such as tension and excellent he is willing to try Phenergan with codeine; the Phenergan has antihistaminic properties that may mitigate the itching. He was advised to continue his neb treatments as needed; he has no wheezing or distress at this time.     Hanley Seamen, MD 04/01/14 2130  Hanley Seamen, MD 04/01/14 [phone]

## 2014-04-01 NOTE — ED Notes (Signed)
Pt c/o SOB with nonproductive cough x302months, states been tx'd for bronchitis with nebs, inhaler, steroids with no relief, worse when laying down; pt speaking in full complete sentences

## 2014-05-09 ENCOUNTER — Ambulatory Visit (INDEPENDENT_AMBULATORY_CARE_PROVIDER_SITE_OTHER): Payer: Medicare Other | Admitting: *Deleted

## 2014-05-09 DIAGNOSIS — I495 Sick sinus syndrome: Secondary | ICD-10-CM

## 2014-05-09 DIAGNOSIS — I498 Other specified cardiac arrhythmias: Secondary | ICD-10-CM

## 2014-05-09 NOTE — Progress Notes (Signed)
Remote pacemaker transmission.   

## 2014-05-11 LAB — MDC_IDC_ENUM_SESS_TYPE_REMOTE
Battery Impedance: 3029 Ohm
Brady Statistic AP VP Percent: 1 %
Brady Statistic AP VS Percent: 56 %
Brady Statistic AS VP Percent: 0 %
Brady Statistic AS VS Percent: 44 %
Date Time Interrogation Session: 20160105152407
Lead Channel Impedance Value: 491 Ohm
Lead Channel Pacing Threshold Amplitude: 0.875 V
Lead Channel Pacing Threshold Amplitude: 1 V
Lead Channel Pacing Threshold Pulse Width: 0.4 ms
Lead Channel Sensing Intrinsic Amplitude: 2.8 mV
Lead Channel Sensing Intrinsic Amplitude: 5.6 mV
Lead Channel Setting Pacing Amplitude: 2.5 V
Lead Channel Setting Pacing Pulse Width: 0.4 ms
Lead Channel Setting Sensing Sensitivity: 2.8 mV
MDC IDC MSMT BATTERY REMAINING LONGEVITY: 16 mo
MDC IDC MSMT BATTERY VOLTAGE: 2.71 V
MDC IDC MSMT LEADCHNL RV IMPEDANCE VALUE: 596 Ohm
MDC IDC MSMT LEADCHNL RV PACING THRESHOLD PULSEWIDTH: 0.4 ms
MDC IDC SET LEADCHNL RA PACING AMPLITUDE: 2 V

## 2014-06-07 ENCOUNTER — Other Ambulatory Visit: Payer: Self-pay | Admitting: Internal Medicine

## 2014-06-13 ENCOUNTER — Telehealth: Payer: Self-pay | Admitting: Internal Medicine

## 2014-06-13 NOTE — Telephone Encounter (Signed)
Please advise MW thanks 

## 2014-06-13 NOTE — Telephone Encounter (Signed)
Spoke with patient-aware of that we must get approval from both MD's prior to making appt. Pt aware that MW has approved switch and that I am sending to CY to approve or deny.    CY please advise. Thanks.

## 2014-06-13 NOTE — Telephone Encounter (Signed)
Fine with me

## 2014-06-14 NOTE — Telephone Encounter (Signed)
Ok

## 2014-06-14 NOTE — Telephone Encounter (Signed)
This is not emergent, so please use the available slot in April. Thanks

## 2014-06-14 NOTE — Telephone Encounter (Signed)
Dr Maple HudsonYoung your next available consult slot isn't until April 8th.  Should I use this time slot or is there a held slot before this that the pt can be seen?  Thanks!

## 2014-06-14 NOTE — Telephone Encounter (Signed)
Called and spoke with pt and appt has been scheduled for pt to see CY on 4-8 for consult for copd.  [facility]  Pt is aware of appt date and time and nothing further is needed.

## 2014-06-30 ENCOUNTER — Other Ambulatory Visit: Payer: Self-pay | Admitting: Internal Medicine

## 2014-07-13 ENCOUNTER — Encounter: Payer: Self-pay | Admitting: *Deleted

## 2014-07-16 ENCOUNTER — Other Ambulatory Visit: Payer: Self-pay

## 2014-07-16 ENCOUNTER — Inpatient Hospital Stay (HOSPITAL_COMMUNITY)
Admission: EM | Admit: 2014-07-16 | Discharge: 2014-07-19 | DRG: 190 | Disposition: A | Discharge status: Home / Self Care | Payer: Medicare Other | Attending: Internal Medicine | Admitting: Internal Medicine

## 2014-07-16 ENCOUNTER — Encounter (HOSPITAL_COMMUNITY): Payer: Self-pay | Admitting: Emergency Medicine

## 2014-07-16 ENCOUNTER — Other Ambulatory Visit (HOSPITAL_COMMUNITY): Payer: Self-pay

## 2014-07-16 ENCOUNTER — Emergency Department (HOSPITAL_COMMUNITY): Payer: Medicare Other

## 2014-07-16 DIAGNOSIS — E871 Hypo-osmolality and hyponatremia: Secondary | ICD-10-CM | POA: Diagnosis present

## 2014-07-16 DIAGNOSIS — Z888 Allergy status to other drugs, medicaments and biological substances status: Secondary | ICD-10-CM

## 2014-07-16 DIAGNOSIS — Z950 Presence of cardiac pacemaker: Secondary | ICD-10-CM

## 2014-07-16 DIAGNOSIS — T502X5A Adverse effect of carbonic-anhydrase inhibitors, benzothiadiazides and other diuretics, initial encounter: Secondary | ICD-10-CM | POA: Diagnosis present

## 2014-07-16 DIAGNOSIS — J441 Chronic obstructive pulmonary disease with (acute) exacerbation: Secondary | ICD-10-CM | POA: Diagnosis present

## 2014-07-16 DIAGNOSIS — Z79899 Other long term (current) drug therapy: Secondary | ICD-10-CM | POA: Diagnosis not present

## 2014-07-16 DIAGNOSIS — G8929 Other chronic pain: Secondary | ICD-10-CM | POA: Diagnosis present

## 2014-07-16 DIAGNOSIS — E119 Type 2 diabetes mellitus without complications: Secondary | ICD-10-CM | POA: Diagnosis present

## 2014-07-16 DIAGNOSIS — J449 Chronic obstructive pulmonary disease, unspecified: Secondary | ICD-10-CM

## 2014-07-16 DIAGNOSIS — E785 Hyperlipidemia, unspecified: Secondary | ICD-10-CM | POA: Diagnosis present

## 2014-07-16 DIAGNOSIS — Z880 Allergy status to penicillin: Secondary | ICD-10-CM

## 2014-07-16 DIAGNOSIS — I10 Essential (primary) hypertension: Secondary | ICD-10-CM | POA: Diagnosis present

## 2014-07-16 DIAGNOSIS — Z8249 Family history of ischemic heart disease and other diseases of the circulatory system: Secondary | ICD-10-CM

## 2014-07-16 DIAGNOSIS — G629 Polyneuropathy, unspecified: Secondary | ICD-10-CM | POA: Diagnosis present

## 2014-07-16 DIAGNOSIS — Z87891 Personal history of nicotine dependence: Secondary | ICD-10-CM

## 2014-07-16 DIAGNOSIS — M549 Dorsalgia, unspecified: Secondary | ICD-10-CM | POA: Diagnosis present

## 2014-07-16 DIAGNOSIS — Z885 Allergy status to narcotic agent status: Secondary | ICD-10-CM

## 2014-07-16 DIAGNOSIS — T380X5A Adverse effect of glucocorticoids and synthetic analogues, initial encounter: Secondary | ICD-10-CM | POA: Diagnosis present

## 2014-07-16 DIAGNOSIS — K219 Gastro-esophageal reflux disease without esophagitis: Secondary | ICD-10-CM | POA: Diagnosis present

## 2014-07-16 DIAGNOSIS — J9600 Acute respiratory failure, unspecified whether with hypoxia or hypercapnia: Secondary | ICD-10-CM | POA: Insufficient documentation

## 2014-07-16 DIAGNOSIS — Z825 Family history of asthma and other chronic lower respiratory diseases: Secondary | ICD-10-CM | POA: Diagnosis not present

## 2014-07-16 DIAGNOSIS — Z7951 Long term (current) use of inhaled steroids: Secondary | ICD-10-CM

## 2014-07-16 DIAGNOSIS — D72829 Elevated white blood cell count, unspecified: Secondary | ICD-10-CM | POA: Diagnosis present

## 2014-07-16 DIAGNOSIS — Z7982 Long term (current) use of aspirin: Secondary | ICD-10-CM | POA: Diagnosis not present

## 2014-07-16 DIAGNOSIS — Z96641 Presence of right artificial hip joint: Secondary | ICD-10-CM | POA: Diagnosis present

## 2014-07-16 DIAGNOSIS — J9601 Acute respiratory failure with hypoxia: Secondary | ICD-10-CM | POA: Diagnosis present

## 2014-07-16 LAB — CBC
HCT: 38.4 % — ABNORMAL LOW (ref 39.0–52.0)
HEMOGLOBIN: 13.4 g/dL (ref 13.0–17.0)
MCH: 31.5 pg (ref 26.0–34.0)
MCHC: 34.9 g/dL (ref 30.0–36.0)
MCV: 90.1 fL (ref 78.0–100.0)
Platelets: 208 10*3/uL (ref 150–400)
RBC: 4.26 MIL/uL (ref 4.22–5.81)
RDW: 13.5 % (ref 11.5–15.5)
WBC: 10.3 10*3/uL (ref 4.0–10.5)

## 2014-07-16 LAB — BASIC METABOLIC PANEL
Anion gap: 10 (ref 5–15)
BUN: 9 mg/dL (ref 6–23)
CHLORIDE: 92 mmol/L — AB (ref 96–112)
CO2: 27 mmol/L (ref 19–32)
Calcium: 9.5 mg/dL (ref 8.4–10.5)
Creatinine, Ser: 1.21 mg/dL (ref 0.50–1.35)
GFR calc Af Amer: 68 mL/min — ABNORMAL LOW (ref >90)
GFR calc non Af Amer: 59 mL/min — ABNORMAL LOW (ref >90)
Glucose, Bld: 91 mg/dL (ref 70–99)
Potassium: 4 mmol/L (ref 3.5–5.1)
SODIUM: 129 mmol/L — AB (ref 135–145)

## 2014-07-16 LAB — I-STAT TROPONIN, ED: Troponin i, poc: 0 ng/mL (ref 0.00–0.08)

## 2014-07-16 MED ORDER — IPRATROPIUM BROMIDE 0.02 % IN SOLN
0.5000 mg | Freq: Once | RESPIRATORY_TRACT | Status: AC
  Administered 2014-07-16: 0.5 mg via RESPIRATORY_TRACT
  Filled 2014-07-16: qty 2.5

## 2014-07-16 MED ORDER — METHYLPREDNISOLONE SODIUM SUCC 125 MG IJ SOLR
125.0000 mg | Freq: Once | INTRAMUSCULAR | Status: AC
  Administered 2014-07-16: 125 mg via INTRAVENOUS
  Filled 2014-07-16: qty 2

## 2014-07-16 MED ORDER — GABAPENTIN 300 MG PO CAPS
300.0000 mg | ORAL_CAPSULE | Freq: Two times a day (BID) | ORAL | Status: DC
  Administered 2014-07-17 – 2014-07-19 (×6): 300 mg via ORAL
  Filled 2014-07-16 (×6): qty 1

## 2014-07-16 MED ORDER — IPRATROPIUM-ALBUTEROL 0.5-2.5 (3) MG/3ML IN SOLN
RESPIRATORY_TRACT | Status: AC
  Filled 2014-07-16: qty 3

## 2014-07-16 MED ORDER — ATORVASTATIN CALCIUM 40 MG PO TABS
40.0000 mg | ORAL_TABLET | Freq: Every day | ORAL | Status: DC
  Administered 2014-07-17 – 2014-07-19 (×3): 40 mg via ORAL
  Filled 2014-07-16 (×3): qty 1

## 2014-07-16 MED ORDER — DOCUSATE SODIUM 100 MG PO CAPS
100.0000 mg | ORAL_CAPSULE | Freq: Every day | ORAL | Status: DC
  Administered 2014-07-17 – 2014-07-19 (×3): 100 mg via ORAL
  Filled 2014-07-16 (×3): qty 1

## 2014-07-16 MED ORDER — PROMETHAZINE-CODEINE 6.25-10 MG/5ML PO SYRP
5.0000 mL | ORAL_SOLUTION | ORAL | Status: DC | PRN
  Administered 2014-07-18 – 2014-07-19 (×5): 5 mL via ORAL
  Filled 2014-07-16 (×5): qty 5

## 2014-07-16 MED ORDER — IPRATROPIUM BROMIDE 0.02 % IN SOLN
0.5000 mg | Freq: Four times a day (QID) | RESPIRATORY_TRACT | Status: DC | PRN
  Administered 2014-07-19: 0.5 mg via RESPIRATORY_TRACT
  Filled 2014-07-16: qty 2.5

## 2014-07-16 MED ORDER — ALBUTEROL (5 MG/ML) CONTINUOUS INHALATION SOLN
10.0000 mg/h | INHALATION_SOLUTION | Freq: Once | RESPIRATORY_TRACT | Status: AC
  Administered 2014-07-16: 10 mg/h via RESPIRATORY_TRACT
  Filled 2014-07-16: qty 20

## 2014-07-16 MED ORDER — HEPARIN SODIUM (PORCINE) 5000 UNIT/ML IJ SOLN
5000.0000 [IU] | Freq: Three times a day (TID) | INTRAMUSCULAR | Status: DC
  Administered 2014-07-17 – 2014-07-19 (×8): 5000 [IU] via SUBCUTANEOUS
  Filled 2014-07-16 (×8): qty 1

## 2014-07-16 MED ORDER — PANTOPRAZOLE SODIUM 40 MG PO TBEC
40.0000 mg | DELAYED_RELEASE_TABLET | Freq: Every day | ORAL | Status: DC
  Administered 2014-07-17 – 2014-07-19 (×3): 40 mg via ORAL
  Filled 2014-07-16 (×3): qty 1

## 2014-07-16 MED ORDER — IPRATROPIUM-ALBUTEROL 0.5-2.5 (3) MG/3ML IN SOLN
3.0000 mL | Freq: Once | RESPIRATORY_TRACT | Status: AC
  Administered 2014-07-16: 3 mL via RESPIRATORY_TRACT

## 2014-07-16 MED ORDER — PREDNISONE 20 MG PO TABS
50.0000 mg | ORAL_TABLET | Freq: Every day | ORAL | Status: DC
  Administered 2014-07-17: 50 mg via ORAL
  Filled 2014-07-16: qty 2
  Filled 2014-07-16: qty 1

## 2014-07-16 MED ORDER — TRIAMTERENE-HCTZ 37.5-25 MG PO TABS
1.0000 | ORAL_TABLET | Freq: Every day | ORAL | Status: DC
  Administered 2014-07-18 – 2014-07-19 (×2): 1 via ORAL
  Filled 2014-07-16 (×4): qty 1

## 2014-07-16 MED ORDER — ALBUTEROL SULFATE (2.5 MG/3ML) 0.083% IN NEBU
5.0000 mg | INHALATION_SOLUTION | Freq: Once | RESPIRATORY_TRACT | Status: AC
  Administered 2014-07-16: 5 mg via RESPIRATORY_TRACT
  Filled 2014-07-16: qty 6

## 2014-07-16 MED ORDER — FAMOTIDINE 20 MG PO TABS
20.0000 mg | ORAL_TABLET | Freq: Every day | ORAL | Status: DC
  Administered 2014-07-17 – 2014-07-19 (×3): 20 mg via ORAL
  Filled 2014-07-16 (×3): qty 1

## 2014-07-16 MED ORDER — ASPIRIN 81 MG PO CHEW
81.0000 mg | CHEWABLE_TABLET | Freq: Every day | ORAL | Status: DC
  Administered 2014-07-17 – 2014-07-19 (×3): 81 mg via ORAL
  Filled 2014-07-16 (×6): qty 1

## 2014-07-16 NOTE — H&P (Signed)
[facility] History and Physical  [name] [name] ZOX:[id number] DOB: [birth year] DOA: 07/16/2014  Referring physician: EDP PCP: [name], MD   Chief Complaint: SOB   HPI: [name] [name] is a 69 y.o. male who presents to the ED with c/o SOB, cough, wheezing, DOE.  Started on Wed, he attributes this to "increase in pollen count".  His PCP called in ABx on Thursday but his symptoms arent improving.  His inhaler also isnt helping.  This is the patients 3rd bout of bronchitis this year, he does not have a formal diagnosis of COPD at this time.  When he saw Dr. Sherene Sires in November, Dr. Sherene Sires thought that he had upper airway cough syndrome.  Patient denies having PFTs done.  Review of Systems: Systems reviewed.  As above, otherwise negative  Past Medical History  Diagnosis Date  . Hypertension   . Headache(784.0)   . Bradycardia     pacemaker - Medtronic Adapta #ADDRO1 - December 22, 2005  . Hyperlipidemia   . Depression   . Insomnia   . Chronic back pain   . Anginal pain   . Dysrhythmia     pt states I have when they check the pacemaker,  . COPD (chronic obstructive pulmonary disease)   . Pneumonia     had two times unsure of when  . Diabetes mellitus     type II controled with diet  . Pacemaker     Medtronic Dr. Graciela Husbands   . GERD (gastroesophageal reflux disease)   . Arthritis   . Neuromuscular disorder     peripheral neuropathy BLE  . Hx of cardiovascular stress test     Lexiscan Myoview (9/14):  Low risk, small mild basal to mid inferior perfusion defect (suspected diaphragmatic attenuation), no ischemia, EF 57%   Past Surgical History  Procedure Laterality Date  . Back surgery    . Hemorrhoid surgery    . Pacemaker insertion    . Insert / replace / remove pacemaker    . Total hip arthroplasty Right 01/25/2013    Procedure: TOTAL HIP ARTHROPLASTY ANTERIOR APPROACH;  Surgeon: Velna Ochs, MD;  Location: [facility] OR;  Service: Orthopedics;  Laterality: Right;   Social  History:  reports that he has quit smoking. He has never used smokeless tobacco. He reports that he drinks alcohol. He reports that he does not use illicit drugs.  Allergies  Allergen Reactions  . Penicillins Anaphylaxis  . Metoprolol Tartrate     HR drops too low  . Codeine Rash    Family History  Problem Relation Age of Onset  . Heart disease Mother   . Asthma Maternal Aunt      Prior to Admission medications   Medication Sig Start Date End Date Taking? Authorizing Provider  aspirin 81 MG tablet Take 81 mg by mouth daily.   Yes Historical Provider, MD  atorvastatin (LIPITOR) 40 MG tablet Take 40 mg by mouth daily.   Yes Historical Provider, MD  B Complex Vitamins (B COMPLEX-B12) TABS Take 1 tablet by mouth daily.    Yes Historical Provider, MD  Cholecalciferol (HM VITAMIN D3) 4000 UNITS CAPS Take by mouth daily.   Yes Historical Provider, MD  docusate sodium (COLACE) 100 MG capsule Take 100 mg by mouth daily.   Yes Historical Provider, MD  famotidine (PEPCID) 20 MG tablet TAKE ONE TABLET BY MOUTH ONCE DAILY AT BEDTIME 06/07/14  Yes Nyoka Cowden, MD  gabapentin (NEURONTIN) 300 MG capsule Take 300 mg by  mouth 2 (two) times daily.   Yes Historical Provider, MD  ipratropium (ATROVENT) 0.02 % nebulizer solution Take 0.5 mg by nebulization every 6 (six) hours as needed for wheezing or shortness of breath.   Yes Historical Provider, MD  pantoprazole (PROTONIX) 40 MG tablet TAKE ONE TABLET BY MOUTH ONCE DAILY TAKE  30-60  MINUTES  BEFORE  FIRST  MEAL  OF  THE  DAY 06/07/14  Yes Nyoka CowdenMichael B Wert, MD  promethazine-codeine [facility](PHENERGAN WITH CODEINE) 6.25-10 MG/5ML syrup Take 5 mLs by mouth every 4 (four) hours as needed for cough. 04/01/14  Yes [name] Molpus, MD  triamterene-hydrochlorothiazide (MAXZIDE-25) 37.5-25 MG per tablet TAKE ONE TABLET BY MOUTH ONCE DAILY 06/30/14  Yes Duke SalviaSteven C Klein, MD  vitamin C (ASCORBIC ACID) 500 MG tablet Take 1,000 mg by mouth daily.    Yes Historical Provider, MD    Physical Exam: Filed Vitals:   07/16/14 2215  BP:   Pulse: 115  Temp:   Resp: 21    BP 123/67 mmHg  Pulse 115  Temp(Src) 98.1 F (36.7 C)  Resp 21  Ht 6' (1.829 m)  Wt 100.699 kg (222 lb)  BMI 30.10 kg/m2  SpO2 95%  General Appearance:    Alert, oriented, no distress, appears stated age  Head:    Normocephalic, atraumatic  Eyes:    PERRL, EOMI, sclera non-icteric        Nose:   Nares without drainage or epistaxis. Mucosa, turbinates normal  Throat:   Moist mucous membranes. Oropharynx without erythema or exudate.  Neck:   Supple. No carotid bruits.  No thyromegaly.  No lymphadenopathy.   Back:     No CVA tenderness, no spinal tenderness  Lungs:     B wheezing, prolonged expiratory phase, does not appear to be upper airway stridor.  Chest wall:    No tenderness to palpitation  Heart:    Regular rate and rhythm without murmurs, gallops, rubs  Abdomen:     Soft, non-tender, nondistended, normal bowel sounds, no organomegaly  Genitalia:    deferred  Rectal:    deferred  Extremities:   No clubbing, cyanosis or edema.  Pulses:   2+ and symmetric all extremities  Skin:   Skin color, texture, turgor normal, no rashes or lesions  Lymph nodes:   Cervical, supraclavicular, and axillary nodes normal  Neurologic:   CNII-XII intact. Normal strength, sensation and reflexes      throughout    Labs on Admission:  Basic Metabolic Panel:  Recent Labs Lab 07/16/14 1715  NA 129*  K 4.0  CL 92*  CO2 27  GLUCOSE 91  BUN 9  CREATININE 1.21  CALCIUM 9.5   Liver Function Tests: No results for input(s): AST, ALT, ALKPHOS, BILITOT, PROT, ALBUMIN in the last 168 hours. No results for input(s): LIPASE, AMYLASE in the last 168 hours. No results for input(s): AMMONIA in the last 168 hours. CBC:  Recent Labs Lab 07/16/14 1715  WBC 10.3  HGB 13.4  HCT 38.4*  MCV 90.1  PLT 208   Cardiac Enzymes: No results for input(s): CKTOTAL, CKMB, CKMBINDEX, TROPONINI in the last 168  hours.  BNP (last 3 results) No results for input(s): PROBNP in the last 8760 hours. CBG: No results for input(s): GLUCAP in the last 168 hours.  Radiological Exams on Admission: Dg Chest Portable 1 View  07/16/2014   CLINICAL DATA:  Shortness of breath and wheezing 2 days  EXAM: PORTABLE CHEST - 1 VIEW  COMPARISON:  06/01/2014 and 04/01/2014  FINDINGS: Patient is slightly rotated to the right. Dual lead left-sided pacemaker is unchanged. Lungs are adequately inflated with mild prominence of the left hilar vasculature which may be partially related to patient rotation although cannot exclude mild asymmetric vascular congestion versus underlying airspace process. Cardiomediastinal silhouette and remainder of the exam is unchanged.  IMPRESSION: Mild opacification over the left hilar vasculature which may be due to asymmetric vascular congestion versus underlying airspace process such as a perihilar pneumonia. Consider followup two-view chest x-ray for further evaluation.   Electronically Signed   By: Elberta Fortis M.D.   On: 07/16/2014 18:27    EKG: Independently reviewed.  Assessment/Plan Active Problems:   COPD exacerbation   Acute respiratory failure with hypoxia   1. Acute respiratory failure with hypoxia - patient has bilateral wheezing, prolonged expiratory phase, hypoxia that is worse with activity.  Suspect he has underlying undiagnosed COPD. 1. Adult wheeze protocol 2. Prednisone daily 3. Continuous pulse ox    Code Status: Full Code  Family Communication: Family at bedside Disposition Plan: Admit to inpatient  Time spent: 70 min  Quinzell Malcomb M. [facility] Pager [phone]  If 7AM-7PM, please contact the day team taking care of the patient [email or web address] Password [email or web address] 07/16/2014, 11:12 PM

## 2014-07-16 NOTE — ED Notes (Signed)
hospitalist at bedside

## 2014-07-16 NOTE — ED Provider Notes (Addendum)
CSN: [id number]     Arrival date & time 07/16/14  1659 History   First MD Initiated Contact with Patient 07/16/14 1721     Chief Complaint  Patient presents with  . Shortness of Breath  . Wheezing     (Consider location/radiation/quality/duration/timing/severity/associated sxs/prior Treatment) Patient is a 69 y.o. male presenting with shortness of breath and wheezing. The history is provided by the patient.  Shortness of Breath Severity:  Severe Onset quality:  Gradual Duration:  5 days Timing:  Constant Progression:  Worsening Chronicity:  Recurrent Context: pollens   Context comment:  States on wednesday when pollen count went up he developed wheezing and SOB.  MD called in abx on thursday but not improving Relieved by:  Nothing Worsened by:  Activity Ineffective treatments:  Inhaler (abx) Associated symptoms: cough and wheezing   Associated symptoms: no abdominal pain, no chest pain, no fever and no vomiting   Risk factors comment:  Hx of COPD Wheezing Associated symptoms: cough and shortness of breath   Associated symptoms: no chest pain and no fever     Past Medical History  Diagnosis Date  . Hypertension   . Headache(784.0)   . Bradycardia     pacemaker - Medtronic Adapta #ADDRO1 - December 22, 2005  . Hyperlipidemia   . Depression   . Insomnia   . Chronic back pain   . Anginal pain   . Dysrhythmia     pt states I have when they check the pacemaker,  . COPD (chronic obstructive pulmonary disease)   . Pneumonia     had two times unsure of when  . Diabetes mellitus     type II controled with diet  . Pacemaker     Medtronic Dr. Graciela Husbands   . GERD (gastroesophageal reflux disease)   . Arthritis   . Neuromuscular disorder     peripheral neuropathy BLE  . Hx of cardiovascular stress test     Lexiscan Myoview (9/14):  Low risk, small mild basal to mid inferior perfusion defect (suspected diaphragmatic attenuation), no ischemia, EF 57%   Past Surgical History   Procedure Laterality Date  . Back surgery    . Hemorrhoid surgery    . Pacemaker insertion    . Insert / replace / remove pacemaker    . Total hip arthroplasty Right 01/25/2013    Procedure: TOTAL HIP ARTHROPLASTY ANTERIOR APPROACH;  Surgeon: Velna Ochs, MD;  Location: [facility] OR;  Service: Orthopedics;  Laterality: Right;   Family History  Problem Relation Age of Onset  . Heart disease Mother   . Asthma Maternal Aunt    History  Substance Use Topics  . Smoking status: Former Smoker -- 2.00 packs/day for 15 years  . Smokeless tobacco: Never Used  . Alcohol Use: 0.0 oz/week    0 Standard drinks or equivalent per week     Comment: beer occ    Review of Systems  Constitutional: Negative for fever.  Respiratory: Positive for cough, shortness of breath and wheezing.   Cardiovascular: Negative for chest pain.  Gastrointestinal: Negative for vomiting and abdominal pain.  All other systems reviewed and are negative.     Allergies  Penicillins; Metoprolol tartrate; and Codeine  Home Medications   Prior to Admission medications   Medication Sig Start Date End Date Taking? Authorizing Provider  amLODipine (NORVASC) 2.5 MG tablet Take 1 tablet (2.5 mg total) by mouth 2 (two) times daily. 08/03/13   Rosalio Macadamia, NP  aspirin 81 MG  tablet Take 81 mg by mouth daily.    Historical Provider, MD  atorvastatin (LIPITOR) 40 MG tablet Take 40 mg by mouth daily.    Historical Provider, MD  B Complex Vitamins (B COMPLEX-B12) TABS Take 1 tablet by mouth daily.     Historical Provider, MD  CALCIUM PO Take 1,300 mg by mouth daily.    Historical Provider, MD  Cholecalciferol (HM VITAMIN D3) 4000 UNITS CAPS Take by mouth daily.    Historical Provider, MD  docusate sodium (COLACE) 100 MG capsule Take 100 mg by mouth daily.    Historical Provider, MD  famotidine (PEPCID) 20 MG tablet TAKE ONE TABLET BY MOUTH ONCE DAILY AT BEDTIME 06/07/14   Nyoka CowdenMichael B Wert, MD  gabapentin (NEURONTIN) 100 MG capsule  Take 100 mg by mouth 4 (four) times daily as needed (for pain).    Historical Provider, MD  ipratropium (ATROVENT) 0.02 % nebulizer solution Take 0.5 mg by nebulization every 6 (six) hours as needed for wheezing or shortness of breath.    Historical Provider, MD  Magnesium 250 MG TABS Take 1 tablet by mouth daily.    Historical Provider, MD  pantoprazole (PROTONIX) 40 MG tablet TAKE ONE TABLET BY MOUTH ONCE DAILY TAKE  30-60  MINUTES  BEFORE  FIRST  MEAL  OF  THE  DAY 06/07/14   Nyoka CowdenMichael B Wert, MD  promethazine-codeine [facility](PHENERGAN WITH CODEINE) 6.25-10 MG/5ML syrup Take 5 mLs by mouth every 4 (four) hours as needed for cough. 04/01/14   Paula LibraJohn Molpus, MD  triamterene-hydrochlorothiazide (MAXZIDE-25) 37.5-25 MG per tablet TAKE ONE TABLET BY MOUTH ONCE DAILY 06/30/14   Duke SalviaSteven C Klein, MD  vitamin C (ASCORBIC ACID) 500 MG tablet Take 500 mg by mouth daily.    Historical Provider, MD   BP 132/88 mmHg  Pulse 94  Temp(Src) 98.1 F (36.7 C)  Resp 25  Ht 6' (1.829 m)  Wt 222 lb (100.699 kg)  BMI 30.10 kg/m2  SpO2 95% Physical Exam  Constitutional: He is oriented to person, place, and time. He appears well-developed and well-nourished. No distress.  HENT:  Head: Normocephalic and atraumatic.  Mouth/Throat: Oropharynx is clear and moist.  Eyes: Conjunctivae and EOM are normal. Pupils are equal, round, and reactive to light.  Neck: Normal range of motion. Neck supple.  Cardiovascular: Normal rate, regular rhythm and intact distal pulses.   No murmur heard. Pulmonary/Chest: Accessory muscle usage present. Tachypnea noted. No respiratory distress. He has wheezes. He has no rales.  Abdominal: Soft. He exhibits no distension. There is no tenderness. There is no rebound and no guarding.  Musculoskeletal: Normal range of motion. He exhibits no edema or tenderness.  Neurological: He is alert and oriented to person, place, and time.  Skin: Skin is warm and dry. No rash noted. No erythema.  Psychiatric: He has  a normal mood and affect. His behavior is normal.  Nursing note and vitals reviewed.   ED Course  Procedures (including critical care time) Labs Review Labs Reviewed  BASIC METABOLIC PANEL - Abnormal; Notable for the following:    Sodium 129 (*)    Chloride 92 (*)    GFR calc non Af Amer 59 (*)    GFR calc Af Amer 68 (*)    All other components within normal limits  CBC - Abnormal; Notable for the following:    HCT 38.4 (*)    All other components within normal limits  I-STAT TROPOININ, ED    Imaging Review Dg Chest Portable 1  View  07/16/2014   CLINICAL DATA:  Shortness of breath and wheezing 2 days  EXAM: PORTABLE CHEST - 1 VIEW  COMPARISON:  06/01/2014 and 04/01/2014  FINDINGS: Patient is slightly rotated to the right. Dual lead left-sided pacemaker is unchanged. Lungs are adequately inflated with mild prominence of the left hilar vasculature which may be partially related to patient rotation although cannot exclude mild asymmetric vascular congestion versus underlying airspace process. Cardiomediastinal silhouette and remainder of the exam is unchanged.  IMPRESSION: Mild opacification over the left hilar vasculature which may be due to asymmetric vascular congestion versus underlying airspace process such as a perihilar pneumonia. Consider followup two-view chest x-ray for further evaluation.   Electronically Signed   By: Elberta Fortis M.D.   On: 07/16/2014 18:27     EKG Interpretation None      MDM   Final diagnoses:  COPD exacerbation    Patient presents with 5 days of worsening wheezing, shortness of breath that started with allergies on Wednesday. Patient was placed on an antibiotic on Thursday without improvement in his symptoms. He received cresting steroids. He is using nebulizers without improvement. Patient has diffuse wheezing, tachypnea and mild retractions. Satting 95-100% on room air and able to speak in short sentences.  Patient given Solu-Medrol and  albuterol/Atrovent. Labs without significant findings. Chest x-ray with a possible perihilar pneumonia however feel this is unlikely as patient denies any fever, productive cough or elevated white count  7:34 PM Pt still wheezing but more comfortable and moving air better.  Will give hour long CAT and then re-eval.  10:27 PM After one hour long CAT patient's wheezing is improved he is feeling better. Patient once go home however will ambulate. Patient ambulated no more than 10 feet before his oxygen saturation dropped to 88% he became severely tachypnea with worsening wheezing and had to be taken back to bed. Feel the patient will require admission and further treatment prior to discharge home.  Gwyneth Sprout, MD 07/16/14 1610  Gwyneth Sprout, MD 07/16/14 2259

## 2014-07-16 NOTE — ED Notes (Signed)
MD at bedside. 

## 2014-07-16 NOTE — ED Notes (Signed)
Robin hicks(daughter)  [phone] *call for updates*

## 2014-07-16 NOTE — ED Notes (Signed)
Pt ambulated to hallway from room, pt became severely sob and oxygen noted to decrease to 88% on RA. Pt wheezing and had to be wheeled back to room. Pt placed on monitor and noted to be 92-93%on RA

## 2014-07-16 NOTE — ED Notes (Signed)
Pt c/o shortness of breath and wheezing onset Tuesday. Pt called his PMD on Thursday and was called in an antibiotic. Pt with inspiratory and expiratory wheezes.

## 2014-07-16 NOTE — ED Notes (Signed)
Called report to RN on 5N.

## 2014-07-17 MED ORDER — ALBUTEROL SULFATE (2.5 MG/3ML) 0.083% IN NEBU
2.5000 mg | INHALATION_SOLUTION | RESPIRATORY_TRACT | Status: DC | PRN
  Administered 2014-07-18 – 2014-07-19 (×4): 2.5 mg via RESPIRATORY_TRACT
  Filled 2014-07-17 (×4): qty 3

## 2014-07-17 MED ORDER — METHYLPREDNISOLONE SODIUM SUCC 125 MG IJ SOLR
60.0000 mg | Freq: Two times a day (BID) | INTRAMUSCULAR | Status: DC
  Administered 2014-07-17 – 2014-07-19 (×5): 60 mg via INTRAVENOUS
  Filled 2014-07-17 (×5): qty 2

## 2014-07-17 MED ORDER — IPRATROPIUM-ALBUTEROL 0.5-2.5 (3) MG/3ML IN SOLN
RESPIRATORY_TRACT | Status: AC
  Administered 2014-07-17: 3 mL
  Filled 2014-07-17: qty 3

## 2014-07-17 MED ORDER — LEVOFLOXACIN 500 MG PO TABS
500.0000 mg | ORAL_TABLET | Freq: Every day | ORAL | Status: DC
  Administered 2014-07-17 – 2014-07-19 (×3): 500 mg via ORAL
  Filled 2014-07-17 (×3): qty 1

## 2014-07-17 MED ORDER — IPRATROPIUM-ALBUTEROL 0.5-2.5 (3) MG/3ML IN SOLN
3.0000 mL | Freq: Four times a day (QID) | RESPIRATORY_TRACT | Status: DC
  Administered 2014-07-17 – 2014-07-19 (×10): 3 mL via RESPIRATORY_TRACT
  Filled 2014-07-17 (×10): qty 3

## 2014-07-17 MED ORDER — GUAIFENESIN ER 600 MG PO TB12
600.0000 mg | ORAL_TABLET | Freq: Two times a day (BID) | ORAL | Status: DC
  Administered 2014-07-17 – 2014-07-19 (×5): 600 mg via ORAL
  Filled 2014-07-17 (×5): qty 1

## 2014-07-17 NOTE — Progress Notes (Signed)
[facility] PROGRESS NOTE  BOBBY BARTON ZOX:[id number] DOB: 08-04-1945 DOA: 07/16/2014 PCP: [name], MD  Assessment/Plan: 1. Acute Hypoxic resp failure -due to COPD excerebration  2. COPD exacerbation -increase solumedrol, add levaquin -duonebs, add mucinex -check FLu PCR -? Pneumonia, CXR in am  3. HTN  -continue maxzide  4. chronic back pain -continue gabapentin  DVT proph: Hep SQ  Code Status: Full COde Family Communication: none at bedside Disposition Plan: home in 2days   HPI/Subjective: Still short of breath, coughing and wheezing  Objective: Filed Vitals:   07/17/14 0552  BP: 102/61  Pulse: 96  Temp: 97.5 F (36.4 C)  Resp: 20    Intake/Output Summary (Last 24 hours) at 07/17/14 1040 Last data filed at 07/17/14 0900  Gross per 24 hour  Intake    240 ml  Output      0 ml  Net    240 ml   Filed Weights   07/16/14 1704 07/17/14 0018  Weight: 100.699 kg (222 lb) 100.699 kg (222 lb)    Exam:   General:  AAOx3, no distress  Cardiovascular: S1S2/RRR  Respiratory: poor air movement, few scattered wheezes  Abdomen: CTAB  Musculoskeletal: soft, Nt, BS present  Ext: no edema c/c  Data Reviewed: Basic Metabolic Panel:  Recent Labs Lab 07/16/14 1715  NA 129*  K 4.0  CL 92*  CO2 27  GLUCOSE 91  BUN 9  CREATININE 1.21  CALCIUM 9.5   Liver Function Tests: No results for input(s): AST, ALT, ALKPHOS, BILITOT, PROT, ALBUMIN in the last 168 hours. No results for input(s): LIPASE, AMYLASE in the last 168 hours. No results for input(s): AMMONIA in the last 168 hours. CBC:  Recent Labs Lab 07/16/14 1715  WBC 10.3  HGB 13.4  HCT 38.4*  MCV 90.1  PLT 208   Cardiac Enzymes: No results for input(s): CKTOTAL, CKMB, CKMBINDEX, TROPONINI in the last 168 hours. BNP (last 3 results) No results for input(s): BNP in the last 8760 hours.  ProBNP (last 3 results) No results for input(s): PROBNP in the last 8760  hours.  CBG: No results for input(s): GLUCAP in the last 168 hours.  No results found for this or any previous visit (from the past 240 hour(s)).   Studies: Dg Chest Portable 1 View  07/16/2014   CLINICAL DATA:  Shortness of breath and wheezing 2 days  EXAM: PORTABLE CHEST - 1 VIEW  COMPARISON:  06/01/2014 and 04/01/2014  FINDINGS: Patient is slightly rotated to the right. Dual lead left-sided pacemaker is unchanged. Lungs are adequately inflated with mild prominence of the left hilar vasculature which may be partially related to patient rotation although cannot exclude mild asymmetric vascular congestion versus underlying airspace process. Cardiomediastinal silhouette and remainder of the exam is unchanged.  IMPRESSION: Mild opacification over the left hilar vasculature which may be due to asymmetric vascular congestion versus underlying airspace process such as a perihilar pneumonia. Consider followup two-view chest x-ray for further evaluation.   Electronically Signed   By: Elberta Fortis M.D.   On: 07/16/2014 18:27    Scheduled Meds: . aspirin  81 mg Oral Daily  . atorvastatin  40 mg Oral Daily  . docusate sodium  100 mg Oral Daily  . famotidine  20 mg Oral Q breakfast  . gabapentin  300 mg Oral BID  . guaiFENesin  600 mg Oral BID  . heparin  5,000 Units Subcutaneous 3 times per day  . ipratropium-albuterol  3 mL Nebulization QID  .  levofloxacin  500 mg Oral Daily  . methylPREDNISolone (SOLU-MEDROL) injection  60 mg Intravenous Q12H  . pantoprazole  40 mg Oral Daily  . triamterene-hydrochlorothiazide  1 tablet Oral Daily   Continuous Infusions:  Antibiotics Given (last 72 hours)    None      Active Problems:   COPD exacerbation   Acute respiratory failure with hypoxia    Time spent: 35min    [facility] Texas Endoscopy Center LLCJOSEPH,Delainee Tramel  [facility] Pager [phone]. If 7PM-7AM, please contact night-coverage at [email or web address], password [facility] 07/17/2014, 10:40 AM  LOS: 1 day

## 2014-07-17 NOTE — Progress Notes (Signed)
Utilization review completed. Roslyn Else, RN, BSN. 

## 2014-07-18 ENCOUNTER — Inpatient Hospital Stay (HOSPITAL_COMMUNITY): Payer: Medicare Other

## 2014-07-18 LAB — BASIC METABOLIC PANEL
Anion gap: 11 (ref 5–15)
BUN: 21 mg/dL (ref 6–23)
CHLORIDE: 91 mmol/L — AB (ref 96–112)
CO2: 26 mmol/L (ref 19–32)
Calcium: 9 mg/dL (ref 8.4–10.5)
Creatinine, Ser: 1.52 mg/dL — ABNORMAL HIGH (ref 0.50–1.35)
GFR calc Af Amer: 52 mL/min — ABNORMAL LOW (ref >90)
GFR calc non Af Amer: 45 mL/min — ABNORMAL LOW (ref >90)
GLUCOSE: 212 mg/dL — AB (ref 70–99)
POTASSIUM: 4.3 mmol/L (ref 3.5–5.1)
Sodium: 128 mmol/L — ABNORMAL LOW (ref 135–145)

## 2014-07-18 LAB — INFLUENZA PANEL BY PCR (TYPE A & B)
H1N1 flu by pcr: NOT DETECTED
Influenza A By PCR: NEGATIVE
Influenza B By PCR: NEGATIVE

## 2014-07-18 LAB — CBC
HCT: 37.1 % — ABNORMAL LOW (ref 39.0–52.0)
HEMOGLOBIN: 12.6 g/dL — AB (ref 13.0–17.0)
MCH: 31.3 pg (ref 26.0–34.0)
MCHC: 34 g/dL (ref 30.0–36.0)
MCV: 92.3 fL (ref 78.0–100.0)
PLATELETS: 197 10*3/uL (ref 150–400)
RBC: 4.02 MIL/uL — ABNORMAL LOW (ref 4.22–5.81)
RDW: 13.7 % (ref 11.5–15.5)
WBC: 21.6 10*3/uL — ABNORMAL HIGH (ref 4.0–10.5)

## 2014-07-18 MED ORDER — ACETAMINOPHEN 325 MG PO TABS
650.0000 mg | ORAL_TABLET | ORAL | Status: DC | PRN
  Administered 2014-07-18 (×3): 650 mg via ORAL
  Filled 2014-07-18 (×3): qty 2

## 2014-07-18 NOTE — Progress Notes (Addendum)
[facility] PROGRESS NOTE  [name] [name] XLK:[id number] DOB: 04-16-1946 DOA: 07/16/2014 Erick ColaceCP: [name], WILLIAM, MD   Brief Narrative; 68/M with COPD, chronic cough, recurrent bronchitis admitted with COPD exacerbation, slowly improving  Assessment/Plan: 1. Acute Hypoxic resp failure -due to COPD excerebration -improving  2. COPD exacerbation -improving -continue solumedrol, levaquin -duonebs, add mucinex -FLu PCR pending -repeat CXR unremarkable  3. HTN  -continue maxzide  4. chronic back pain -continue gabapentin  5. Leukocytosis -due to steroids and stress demargination -afebrile, monitor  6. Hip Pain -chronic for 1year, needs Hip revision-[facility], postponed for now  DVT proph: Hep SQ  Code Status: Full COde Family Communication: none at bedside, called and d/w daughter yetserday Disposition Plan: home in 1-2days   HPI/Subjective: Still short of breath-but starting to improve, less wheezing, mod cough -dry  Objective: Filed Vitals:   07/18/14 0500  BP: 147/82  Pulse: 88  Temp: 97.5 F (36.4 C)  Resp: 22    Intake/Output Summary (Last 24 hours) at 07/18/14 0907 Last data filed at 07/17/14 1300  Gross per 24 hour  Intake    240 ml  Output      0 ml  Net    240 ml   Filed Weights   07/16/14 1704 07/17/14 0018  Weight: 100.699 kg (222 lb) 100.699 kg (222 lb)    Exam:   General:  AAOx3, no distress  Cardiovascular: S1S2/RRR  Respiratory:improving  air movement, few scattered wheezes  Abdomen: CTAB  Musculoskeletal: soft, Nt, BS present  Ext: no edema c/c  Data Reviewed: Basic Metabolic Panel:  Recent Labs Lab 07/16/14 1715 07/18/14 0530  NA 129* 128*  K 4.0 4.3  CL 92* 91*  CO2 27 26  GLUCOSE 91 212*  BUN 9 21  CREATININE 1.21 1.52*  CALCIUM 9.5 9.0   Liver Function Tests: No results for input(s): AST, ALT, ALKPHOS, BILITOT, PROT, ALBUMIN in the last 168 hours. No results  for input(s): LIPASE, AMYLASE in the last 168 hours. No results for input(s): AMMONIA in the last 168 hours. CBC:  Recent Labs Lab 07/16/14 1715 07/18/14 0530  WBC 10.3 21.6*  HGB 13.4 12.6*  HCT 38.4* 37.1*  MCV 90.1 92.3  PLT 208 197   Cardiac Enzymes: No results for input(s): CKTOTAL, CKMB, CKMBINDEX, TROPONINI in the last 168 hours. BNP (last 3 results) No results for input(s): BNP in the last 8760 hours.  ProBNP (last 3 results) No results for input(s): PROBNP in the last 8760 hours.  CBG: No results for input(s): GLUCAP in the last 168 hours.  No results found for this or any previous visit (from the past 240 hour(s)).   Studies: Dg Chest Portable 1 View  07/16/2014   CLINICAL DATA:  Shortness of breath and wheezing 2 days  EXAM: PORTABLE CHEST - 1 VIEW  COMPARISON:  06/01/2014 and 04/01/2014  FINDINGS: Patient is slightly rotated to the right. Dual lead left-sided pacemaker is unchanged. Lungs are adequately inflated with mild prominence of the left hilar vasculature which may be partially related to patient rotation although cannot exclude mild asymmetric vascular congestion versus underlying airspace process. Cardiomediastinal silhouette and remainder of the exam is unchanged.  IMPRESSION: Mild opacification over the left hilar vasculature which may be due to asymmetric vascular congestion versus underlying airspace process such as a perihilar pneumonia. Consider followup two-view chest x-ray for further evaluation.   Electronically Signed   By: Elberta Fortisaniel  Boyle M.D.  On: 07/16/2014 18:27    Scheduled Meds: . aspirin  81 mg Oral Daily  . atorvastatin  40 mg Oral Daily  . docusate sodium  100 mg Oral Daily  . famotidine  20 mg Oral Q breakfast  . gabapentin  300 mg Oral BID  . guaiFENesin  600 mg Oral BID  . heparin  5,000 Units Subcutaneous 3 times per day  . ipratropium-albuterol  3 mL Nebulization QID  . levofloxacin  500 mg Oral Daily  . methylPREDNISolone  (SOLU-MEDROL) injection  60 mg Intravenous Q12H  . pantoprazole  40 mg Oral Daily  . triamterene-hydrochlorothiazide  1 tablet Oral Daily   Continuous Infusions:  Antibiotics Given (last 72 hours)    Date/Time Action Medication Dose   07/17/14 1130 Given   levofloxacin (LEVAQUIN) tablet 500 mg 500 mg      Active Problems:   COPD exacerbation   Acute respiratory failure with hypoxia    Time spent:    [facility]  [facility] Pager [phone]. If 7PM-7AM, please contact night-coverage at [email or web address], password [facility] 07/18/2014, 9:07 AM  LOS: 2 days

## 2014-07-18 NOTE — Progress Notes (Signed)
Inpatient Diabetes Program Recommendations  AACE/ADA: New Consensus Statement on Inpatient Glycemic Control (2013)  Target Ranges:  Prepandial:   less than 140 mg/dL      Peak postprandial:   less than 180 mg/dL (1-2 hours)      Critically ill patients:  140 - 180 mg/dL   Reason for Assessment:  Results for [name] [name], [name] [name] (MRN [id number]) as of 07/18/2014 12:37  Ref. Range 07/18/2014 05:30  Glucose Latest Range: 70-99 mg/dL 409212 (H)    No history of diabetes however patient is on IV steroids.  May consider checking CBG's tid with meals and HS.  If greater than 140 mg/dL, consider adding Novolog moderate correction tid with meals and HS.  Thanks, Beryl MeagerJenny Bethzaida Boord, RN, BC-ADM Inpatient Diabetes Coordinator Pager [phone]

## 2014-07-19 LAB — BASIC METABOLIC PANEL
ANION GAP: 14 (ref 5–15)
BUN: 25 mg/dL — ABNORMAL HIGH (ref 6–23)
CO2: 24 mmol/L (ref 19–32)
CREATININE: 1.42 mg/dL — AB (ref 0.50–1.35)
Calcium: 9.4 mg/dL (ref 8.4–10.5)
Chloride: 91 mmol/L — ABNORMAL LOW (ref 96–112)
GFR calc non Af Amer: 49 mL/min — ABNORMAL LOW (ref >90)
GFR, EST AFRICAN AMERICAN: 56 mL/min — AB (ref >90)
Glucose, Bld: 238 mg/dL — ABNORMAL HIGH (ref 70–99)
Potassium: 4.6 mmol/L (ref 3.5–5.1)
SODIUM: 129 mmol/L — AB (ref 135–145)

## 2014-07-19 MED ORDER — LEVOFLOXACIN 500 MG PO TABS: 500.0000 mg | ORAL_TABLET | Freq: Every day | ORAL | Status: DC

## 2014-07-19 MED ORDER — AMLODIPINE BESYLATE 10 MG PO TABS
10.0000 mg | ORAL_TABLET | Freq: Every day | ORAL | Status: DC
Start: 2014-07-19 — End: 2014-07-19

## 2014-07-19 MED ORDER — PREDNISONE (PAK) 10 MG PO TABS: ORAL_TABLET | ORAL | Status: DC

## 2014-07-19 MED ORDER — AMLODIPINE BESYLATE 10 MG PO TABS: 10.0000 mg | ORAL_TABLET | Freq: Every day | ORAL | Status: DC

## 2014-07-19 NOTE — Progress Notes (Addendum)
Pt O2 sat is 90-95% on RA at rest, his O2 sat dropped to 87 after ambulating on RA. When he is back on 2 L of Nasal Cannula, his O2 sat rang from 92 -95%. Home oxygen is needed per MD order.

## 2014-07-19 NOTE — Discharge Summary (Signed)
Physician Discharge Summary  [name] [name] RUE:[id number] DOB: 1945/06/15 DOA: 07/16/2014  PCP: [name], MD  Admit date: 07/16/2014 Discharge date: 07/19/2014  Time spent: 30 minutes  Recommendations for Outpatient Follow-up:  1. Follow up with PCP in less than a week.  2. Please check your BMP when you see your PCP,.   Discharge Diagnoses:  Active Problems:   COPD exacerbation   Acute respiratory failure with hypoxia   Discharge Condition: improved  Diet recommendation: regular diet.   Filed Weights   07/16/14 1704 07/17/14 0018  Weight: 100.699 kg (222 lb) 100.699 kg (222 lb)    History of present illness:  68/M with COPD, chronic cough, recurrent bronchitis admitted with COPD exacerbation,  Hospital Course:  Acute hypoxic respiratory failure secondary to copd exacerbation: Much improved. Is being discharged on oral steroids an dlevaquin.    Hypertension: better controlled after we started the amlodipine  Hip pain: plan for revision surgery in the near future.   Leukocytosis: He is afebrile probably from steroids.   Slight worsening of renal parameters: probably from maxzide.  Stopped the maxzide, repeat renal parameters are improving.  Recommend outpatient follow up with PCP .    Hyponatremia: probably from HCTZ.  Stopped the maxzide , repeat bmp in one week.   Procedures:  none  Consultations:  none  Discharge Exam: Filed Vitals:   07/19/14 1501  BP: 162/91  Pulse: 94  Temp: 98 F (36.7 C)  Resp: 20    General: alert afebrile comfortable Cardiovascular: s1s2 Respiratory: scattered wheezing., good air entry .   Discharge Instructions   Discharge Instructions    Diet - low sodium heart healthy    Complete by:  As directed      Discharge instructions    Complete by:  As directed   Follow up with pulmonology as recommended. Your renal function is slightly worse, hence we stopped the maxzide and ordered an alternative blood  pressure medication. Please check your BMP in less than a week at your PCP appointment.          Current Discharge Medication List    START taking these medications   Details  amLODipine (NORVASC) 10 MG tablet Take 1 tablet (10 mg total) by mouth daily. Qty: 30 tablet, Refills: 0    levofloxacin (LEVAQUIN) 500 MG tablet Take 1 tablet (500 mg total) by mouth daily. Qty: 2 tablet, Refills: 0    predniSONE (STERAPRED UNI-PAK) 10 MG tablet Prednisone 60 mg daily for 2 days followed by Prednisone 40 mg daily for 3 days followed by  Prednisone 20 mg daily for 3 days. And stop. Qty: 30 tablet, Refills: 0      CONTINUE these medications which have NOT CHANGED   Details  aspirin 81 MG tablet Take 81 mg by mouth daily.    atorvastatin (LIPITOR) 40 MG tablet Take 40 mg by mouth daily.    B Complex Vitamins (B COMPLEX-B12) TABS Take 1 tablet by mouth daily.     Cholecalciferol (HM VITAMIN D3) 4000 UNITS CAPS Take by mouth daily.   Associated Diagnoses: Chronotropic incompetence with sinus node dysfunction; Cardiac pacemaker in situ    docusate sodium (COLACE) 100 MG capsule Take 100 mg by mouth daily.    famotidine (PEPCID) 20 MG tablet TAKE ONE TABLET BY MOUTH ONCE DAILY AT BEDTIME Qty: 30 tablet, Refills: 0    gabapentin (NEURONTIN) 300 MG capsule Take 300 mg by mouth 2 (two) times daily.    ipratropium (ATROVENT) 0.02 %  nebulizer solution Take 0.5 mg by nebulization every 6 (six) hours as needed for wheezing or shortness of breath.    pantoprazole (PROTONIX) 40 MG tablet TAKE ONE TABLET BY MOUTH ONCE DAILY TAKE  30-60  MINUTES  BEFORE  FIRST  MEAL  OF  THE  DAY Qty: 30 tablet, Refills: 0    promethazine-codeine (PHENERGAN WITH CODEINE) 6.25-10 MG/5ML syrup Take 5 mLs by mouth every 4 (four) hours as needed for cough. Qty: 180 mL, Refills: 0    vitamin C (ASCORBIC ACID) 500 MG tablet Take 1,000 mg by mouth daily.       STOP taking these medications      triamterene-hydrochlorothiazide (MAXZIDE-25) 37.5-25 MG per tablet        Allergies  Allergen Reactions  . Penicillins Anaphylaxis  . Metoprolol Tartrate     HR drops too low  . Codeine Rash   Follow-up Information    Follow up with [name], MD. Schedule an appointment as soon as possible for a visit in 1 week.   Specialty:  Family Medicine   Contact information:   [phone] [address] [place] [address] [phone]        The results of significant diagnostics from this hospitalization (including imaging, microbiology, ancillary and laboratory) are listed below for reference.    Significant Diagnostic Studies: Dg Chest 2 View  07/18/2014   CLINICAL DATA:  Dry cough for 1 week.  Shortness of breath.  EXAM: CHEST  2 VIEW  COMPARISON:  July 16, 2014.  FINDINGS: The heart size and mediastinal contours are within normal limits. Both lungs are clear. No pneumothorax or pleural effusion is noted. Left-sided pacemaker is unchanged in position. The visualized skeletal structures are unremarkable.  IMPRESSION: No active cardiopulmonary disease.   Electronically Signed   By: Lupita Raider, M.D.   On: 07/18/2014 10:53   Dg Chest Portable 1 View  07/16/2014   CLINICAL DATA:  Shortness of breath and wheezing 2 days  EXAM: PORTABLE CHEST - 1 VIEW  COMPARISON:  06/01/2014 and 04/01/2014  FINDINGS: Patient is slightly rotated to the right. Dual lead left-sided pacemaker is unchanged. Lungs are adequately inflated with mild prominence of the left hilar vasculature which may be partially related to patient rotation although cannot exclude mild asymmetric vascular congestion versus underlying airspace process. Cardiomediastinal silhouette and remainder of the exam is unchanged.  IMPRESSION: Mild opacification over the left hilar vasculature which may be due to asymmetric vascular congestion versus underlying airspace process such as a perihilar pneumonia. Consider followup two-view  chest x-ray for further evaluation.   Electronically Signed   By: Elberta Fortis M.D.   On: 07/16/2014 18:27    Microbiology: No results found for this or any previous visit (from the past 240 hour(s)).   Labs: Basic Metabolic Panel:  Recent Labs Lab 07/16/14 1715 07/18/14 0530 07/19/14 1500  NA 129* 128* 129*  K 4.0 4.3 4.6  CL 92* 91* 91*  CO2 GLUCOSE 91 212* 238*  BUN 9 21 25*  CREATININE 1.21 1.52* 1.42*  CALCIUM 9.5 9.0 9.4   Liver Function Tests: No results for input(s): AST, ALT, ALKPHOS, BILITOT, PROT, ALBUMIN in the last 168 hours. No results for input(s): LIPASE, AMYLASE in the last 168 hours. No results for input(s): AMMONIA in the last 168 hours. CBC:  Recent Labs Lab 07/16/14 1715 07/18/14 0530  WBC 10.3 21.6*  HGB 13.4 12.6*  HCT 38.4* 37.1*  MCV  90.1 92.3  PLT 208 197   Cardiac Enzymes: No results for input(s): CKTOTAL, CKMB, CKMBINDEX, TROPONINI in the last 168 hours. BNP: BNP (last 3 results) No results for input(s): BNP in the last 8760 hours.  ProBNP (last 3 results) No results for input(s): PROBNP in the last 8760 hours.  CBG: No results for input(s): GLUCAP in the last 168 hours.     SignedKathlen Mody:  Shauntay Brunelli  [facility] 07/19/2014, 4:18 PM

## 2014-07-19 NOTE — Progress Notes (Signed)
CARE MANAGEMENT NOTE 07/19/2014  Patient:  [name] [name],[name] [name]   Account Number:  [id number]  Date Initiated:  07/19/2014  Documentation initiated by:  [facility]  Subjective/Objective Assessment:   COPD exacerbation     Action/Plan:   plan home O2   Anticipated DC Date:  07/19/2014   Anticipated DC Plan:  HOME/SELF CARE      DC Planning Services  CM consult      PAC Choice  DURABLE MEDICAL EQUIPMENT   Choice offered to / List presented to:     DME arranged  OXYGEN      DME agency  [facility]        Status of service:  Completed, signed off Medicare Important Message given?  YES Date Medicare IM given:  07/19/2014 Medicare IM given by:  [facility]Lam[facility]yneman   Discharge Disposition:  HOME/SELF CARE  Per UR Regulation:  Reviewed for med. necessity/level of care/duration of stay  If discussed at Long Length of Stay Meetings, dates discussed:    Comments:  07/19/14 Md ordered home O2, spoke with patient and his daughter about home O2. Patient has had home O2 in the past with [facility] and would like to use [facility] again. Patient and daughter stated that they are comfortable with using home O2 and did not think [facility]. No othe d/[name] needs identified.

## 2014-07-19 NOTE — Progress Notes (Addendum)
SATURATION QUALIFICATIONS: (This note is used to comply with regulatory documentation for home oxygen)  Patient Saturations on Room Air at Rest =90- 95%  Patient Saturations on Room Air while Ambulating = 87-88%  Patient Saturations on 2 Liters of oxygen while Ambulating =92-95 %  Please briefly explain why patient needs home oxygen: Patient saturation dropped below 88 while ambulating.

## 2014-07-19 NOTE — Progress Notes (Signed)
Discharge instructions gave to pt and his daughter. All questions answered and patient is ready to discharge.

## 2014-07-20 ENCOUNTER — Encounter: Payer: Self-pay | Admitting: Internal Medicine

## 2014-08-10 ENCOUNTER — Ambulatory Visit (INDEPENDENT_AMBULATORY_CARE_PROVIDER_SITE_OTHER): Payer: Medicare Other | Admitting: *Deleted

## 2014-08-10 ENCOUNTER — Encounter: Payer: Self-pay | Admitting: Internal Medicine

## 2014-08-10 ENCOUNTER — Telehealth: Payer: Self-pay | Admitting: Cardiology

## 2014-08-10 DIAGNOSIS — I495 Sick sinus syndrome: Secondary | ICD-10-CM

## 2014-08-10 LAB — MDC_IDC_ENUM_SESS_TYPE_REMOTE
Battery Impedance: 3207 Ohm
Battery Remaining Longevity: 15 mo
Brady Statistic AP VP Percent: 0 %
Brady Statistic AP VS Percent: 49 %
Brady Statistic AS VP Percent: 0 %
Brady Statistic AS VS Percent: 50 %
Date Time Interrogation Session: 20160407162255
Lead Channel Impedance Value: 500 Ohm
Lead Channel Impedance Value: 639 Ohm
Lead Channel Pacing Threshold Amplitude: 0.875 V
Lead Channel Pacing Threshold Pulse Width: 0.4 ms
Lead Channel Sensing Intrinsic Amplitude: 2.8 mV
Lead Channel Sensing Intrinsic Amplitude: 5.6 mV
Lead Channel Setting Pacing Amplitude: 2 V
Lead Channel Setting Pacing Amplitude: 2.5 V
Lead Channel Setting Pacing Pulse Width: 0.4 ms
MDC IDC MSMT BATTERY VOLTAGE: 2.7 V
MDC IDC MSMT LEADCHNL RV PACING THRESHOLD AMPLITUDE: 1.25 V
MDC IDC MSMT LEADCHNL RV PACING THRESHOLD PULSEWIDTH: 0.4 ms
MDC IDC SET LEADCHNL RV SENSING SENSITIVITY: 2 mV

## 2014-08-10 NOTE — Telephone Encounter (Signed)
Spoke with pt and reminded pt of remote transmission that is due today. Pt verbalized understanding.   

## 2014-08-10 NOTE — Progress Notes (Signed)
Remote pacemaker transmission.   

## 2014-08-11 ENCOUNTER — Encounter: Payer: Self-pay | Admitting: Internal Medicine

## 2014-08-11 ENCOUNTER — Ambulatory Visit (INDEPENDENT_AMBULATORY_CARE_PROVIDER_SITE_OTHER): Payer: Medicare Other | Admitting: Internal Medicine

## 2014-08-11 VITALS — BP 124/78 | HR 86 | Ht 72.0 in | Wt 223.4 lb

## 2014-08-11 DIAGNOSIS — J441 Chronic obstructive pulmonary disease with (acute) exacerbation: Secondary | ICD-10-CM | POA: Diagnosis not present

## 2014-08-11 DIAGNOSIS — R0602 Shortness of breath: Secondary | ICD-10-CM | POA: Diagnosis not present

## 2014-08-11 DIAGNOSIS — R058 Other specified cough: Secondary | ICD-10-CM

## 2014-08-11 DIAGNOSIS — R05 Cough: Secondary | ICD-10-CM

## 2014-08-11 MED ORDER — FLUTICASONE PROPIONATE HFA 110 MCG/ACT IN AERO
INHALATION_SPRAY | RESPIRATORY_TRACT | Status: DC
Start: 2014-08-11 — End: 2015-09-02

## 2014-08-11 NOTE — Assessment & Plan Note (Signed)
We are using inhalers again now and managing as a chronic bronchitis. I don't think he is aspirating, but he is edentulous so this issue will be watched.

## 2014-08-11 NOTE — Patient Instructions (Addendum)
Sample and script for steroid inhaler Flovent 110, 2puffs and rinse mouth, twice daily  Ok to continue current meds  Please ask Dr Tiburcio PeaHarris' office which pneumonia vaccine you had, for our records  Order- Office spirometry  Ok to go forward with planned hip surgery. He has heart and lung disease, and will be a more risk of surgical complications than someone not so involved. However he is likely to improve gradually, and this condition is long standing. It is probably best to get his hip fixed before time brings further problems.

## 2014-08-11 NOTE — Progress Notes (Signed)
08/11/14 - 69 yoM with COPD, chronic cough changing to my care, complicated by Cardiac dysrhythmia/ pacemaker, HBP    Daughter here Ref[facility] AECOPD, Acute Resp Failure. Discharged on levaquin, prednisone Changing from MW to CY-cough. Trying to get better to get hip surgery to correct hip from previous surgery.  He describes nonproductive cough and shortness of breath that have come and gone over the past 2 years. He had his daughter associate onset with a right hip surgery that didn't go well and requires surgical revision now. He quit smoking in 1975. Remote pneumonia. Never ventilator support. No history of DVT or pulmonary embolism and no diagnosis of asthma. Hospital for acute exacerbation recently, triggered by working outdoors in wind and pollen. Finished Levaquin and prednisone yesterday. Denies choking with food or drink. He does not wake at night coughing or short of breath. Currently using nebulizer with DuoNeb, Anoro inhaler, using pro-air rescue inhaler 3 or 4 times daily, and Singulair. Steroids help. Has used oxygen at 2 L for sleep and when necessary/[facility] since hospital stay. Walking is limited by hip pain before dyspnea. Several in family with asthma and allergy problems. He had worked in a 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 CXR 07/18/14 FINDINGS: The heart size and mediastinal contours are within normal limits. Both lungs are clear. No pneumothorax or pleural effusion is noted. Left-sided pacemaker is unchanged in position. The visualized skeletal structures are unremarkable. IMPRESSION: No active cardiopulmonary disease. Electronically Signed  By: Lupita RaiderJames Green Jr, M.D.  On: 07/18/2014 10:53 Office Spirometry 08/11/2014-moderate restriction of exhaled volume. FVC 2.98/62%, FEV1 2.37/65%, FEV1/FVC 0.79, FEF 25-75 percent 2.43/77%.  Prior to Admission medications   Medication  Sig Start Date End Date Taking? Authorizing Provider  acetaminophen (TYLENOL) 650 MG CR tablet Take 650 mg by mouth every 8 (eight) hours as needed for pain.   Yes Historical Provider, MD  Ernestina PatchesANORO ELLIPTA 62.5-25 MCG/INH AEPB  08/07/14  Yes Historical Provider, MD  atorvastatin (LIPITOR) 40 MG tablet Take 40 mg by mouth daily.   Yes Historical Provider, MD  BENICAR 40 MG tablet Take 1 tablet by mouth daily. 07/27/14  Yes Historical Provider, MD  docusate sodium (COLACE) 100 MG capsule Take 100 mg by mouth daily.   Yes Historical Provider, MD  famotidine (PEPCID) 20 MG tablet TAKE ONE TABLET BY MOUTH ONCE DAILY AT BEDTIME 06/07/14  Yes Nyoka CowdenMichael B Wert, MD  gabapentin (NEURONTIN) 300 MG capsule Take 300 mg by mouth 2 (two) times daily.   Yes Historical Provider, MD  glimepiride (AMARYL) 1 MG tablet Take 1 tablet by mouth daily. 07/27/14  Yes Historical Provider, MD  hydrochlorothiazide (HYDRODIURIL) 12.5 MG tablet Take 1 tablet by mouth daily. 08/03/14  Yes Historical Provider, MD  ipratropium (ATROVENT) 0.02 % nebulizer solution Take 0.5 mg by nebulization every 6 (six) hours as needed for wheezing or shortness of breath.   Yes Historical Provider, MD  ipratropium-albuterol (DUONEB) 0.5-2.5 (3) MG/3ML SOLN  07/31/14  Yes Historical Provider, MD  levocetirizine (XYZAL) 5 MG tablet Take 1 tablet by mouth daily. 07/25/14  Yes Historical Provider, MD  montelukast (SINGULAIR) 10 MG tablet Take 1 tablet by mouth daily. 07/20/14  Yes Historical Provider, MD  Multiple Vitamin (MULTIVITAMIN) tablet Take 1 tablet by mouth daily.   Yes Historical Provider, MD  pantoprazole (PROTONIX) 40 MG tablet TAKE ONE TABLET BY MOUTH ONCE DAILY TAKE  30-60  MINUTES  BEFORE  FIRST  MEAL  OF  THE  DAY 06/07/14  Yes Nyoka Cowden, MD  PROAIR HFA 108 [phone] BASE) MCG/ACT inhaler Inhale 2 puffs into the lungs 4 (four) times daily as needed. 07/27/14  Yes Historical Provider, MD  vitamin C (ASCORBIC ACID) 500 MG tablet Take 1,000 mg by mouth  daily.    Yes Historical Provider, MD  fluticasone (FLOVENT HFA) 110 MCG/ACT inhaler 2 puffs then rinse mouth, twice daily 08/11/14   Waymon Budge, MD   Past Medical History  Diagnosis Date  . Hypertension   . Headache(784.0)   . Bradycardia     pacemaker - Medtronic Adapta #[id number] - December 22, 2005  . Hyperlipidemia   . Depression   . Insomnia   . Chronic back pain   . Anginal pain   . Dysrhythmia     pt states I have when they check the pacemaker,  . COPD (chronic obstructive pulmonary disease)   . Pneumonia     had two times unsure of when  . Diabetes mellitus     type II controled with diet  . Pacemaker     Medtronic Dr. Graciela Husbands   . GERD (gastroesophageal reflux disease)   . Arthritis   . Neuromuscular disorder     peripheral neuropathy BLE  . Hx of cardiovascular stress test     Lexiscan Myoview (9/14):  Low risk, small mild basal to mid inferior perfusion defect (suspected diaphragmatic attenuation), no ischemia, EF 57%   Past Surgical History  Procedure Laterality Date  . Back surgery    . Hemorrhoid surgery    . Pacemaker insertion  ?2007  . Insert / replace / remove pacemaker    . Total hip arthroplasty Right 01/25/2013    Procedure: TOTAL HIP ARTHROPLASTY ANTERIOR APPROACH;  Surgeon: Velna Ochs, MD;  Location: [facility] OR;  Service: Orthopedics;  Laterality: Right;   Family History  Problem Relation Age of Onset  . Heart disease Mother   . Asthma Maternal Aunt   . Allergies      whole family   History   Social History  . Marital Status: Legally Separated    Spouse Name: N/A  . Number of Children: 2  . Years of Education: `   Occupational History  . Retired    Social History Main Topics  . Smoking status: Former Smoker -- 2.00 packs/day for 15 years    Types: Cigarettes    Start date: 05/05/1958    Quit date: 05/05/1973  . Smokeless tobacco: Never Used  . Alcohol Use: 0.0 oz/week    0 Standard drinks or equivalent per week     Comment: beer occ   . Drug Use: No  . Sexual Activity: Not on file   Other Topics Concern  . Not on file   Social History Narrative   ROS-see HPI   Negative unless "+" Constitutional:    weight loss, night sweats, fevers, chills, fatigue, lassitude. HEENT:    headaches, difficulty swallowing, tooth/dental problems, sore throat,       sneezing, itching, ear ache, nasal congestion, post nasal drip, snoring CV:    chest pain, orthopnea, PND, swelling in lower extremities, anasarca,                                  dizziness, palpitations Resp:   +shortness of breath with exertion or at rest.                +  productive cough,   +non-productive cough, coughing up of blood.              change in color of mucus.  wheezing.   Skin:    rash or lesions. GI:  No-   heartburn, indigestion, abdominal pain, nausea, vomiting, diarrhea,                 change in bowel habits, loss of appetite GU: dysuria, change in color of urine, no urgency or frequency.   flank pain. MS:   joint pain, stiffness, decreased range of motion, back pain. Neuro-     nothing unusual Psych:  change in mood or affect.  depression or anxiety.   memory loss.  OBJ- Physical Exam General- Alert, Oriented, Affect-appropriate, Distress- none acute Skin- rash-none, lesions- none, excoriation- none Lymphadenopathy- none Head- atraumatic            Eyes- Gross vision intact, PERRLA, conjunctivae and secretions clear            Ears- Hearing, canals-normal            Nose- Clear, no-Septal dev, mucus, polyps, erosion, perforation             Throat- Mallampati II , mucosa clear , drainage- none, tonsils- atrophic, edentulous+ Neck- flexible , trachea midline, no stridor , thyroid nl, carotid no bruit Chest - symmetrical excursion , unlabored           Heart/CV- RRR , no murmur , no gallop  , no rub, nl s1 s2                           - JVD- none , edema- none, stasis changes- none, varices- none           Lung- clear to P&A, wheeze+trace,  cough+rattle, dullness-none, rub- none           Chest wall- +L pacemaker Abd-  Br/ Gen/ Rectal- Not done, not indicated Extrem- cyanosis- none, clubbing, none, atrophy- none, strength- nl, walker+ Neuro- grossly intact to observation

## 2014-08-11 NOTE — Assessment & Plan Note (Addendum)
Just off of Levaquin and prednisone for treatment of exacerbation that was associated with outdoor work. This is acting like a chronic bronchitis or tracheal bronchitis syndrome. He does not think he is aspirating when he eats although he is edentulous. Medications are reasonable. I'm not sure if his cough pattern explains his restricted spirometry results chest x-ray has not suggested an interstitial lung disease. Plan-start aerosol steroid. Mucinex as needed I think he is stable enough to go forward with planned hip surgery.

## 2014-08-25 ENCOUNTER — Telehealth: Payer: Self-pay | Admitting: Internal Medicine

## 2014-08-25 NOTE — Telephone Encounter (Signed)
Pt c/o swelling: STAT is pt has developed SOB within 24 hours  1. How long have you been experiencing swelling? About a month  2. Where is the swelling located? Legs  3.  Are you currently taking a "fluid pill"? No  4.  Are you currently SOB? No  5.  Have you traveled recently? No   [name] calling stating that pt is retaining a lot of water and wants for him to been seen as soon as possible. Dr. Graciela HusbandsKlein doesn't have any open appts until late June and she states pt needs to be seen before then. Please call back and advise.

## 2014-08-25 NOTE — Telephone Encounter (Signed)
Daughter, Zella BallRobin, calling stating her father has been retaining fluid since his d/c from hospital in March.  No SOB but is having swelling in ankles.  Does not think he has had any wt gain but she has told him to write his wt down every day.  When he was d/c in March Dr. Tiburcio PeaHarris stopped his Triam/HCTZ because Na was low and put him back on Norvasc.  States Mr. Penne Lashssacs did not start Norvasc because side effect is swelling in ankles. States his BP is good so he decreased his Benicar to 20 mg daily.  States they are trying to keep him healthy because he is scheduled for a hip replacement last of May.  They have had to cancel a couple of times.  Wants to be seen as soon as possible either with Sunday SpillersLori Gerhardt,NP or Dr. Graciela HusbandsKlein.  Spoke w/Lori who is flex on Monday and she authorized him to be seen.  Made an appointment for 9:00.  Advised daughter of time and suggested he bring his current medications for review.

## 2014-08-28 ENCOUNTER — Ambulatory Visit (INDEPENDENT_AMBULATORY_CARE_PROVIDER_SITE_OTHER): Payer: Medicare Other | Admitting: Nurse Practitioner

## 2014-08-28 ENCOUNTER — Encounter: Payer: Self-pay | Admitting: Nurse Practitioner

## 2014-08-28 VITALS — BP 148/92 | HR 68 | Resp 20 | Ht 72.0 in | Wt 228.0 lb

## 2014-08-28 DIAGNOSIS — Z950 Presence of cardiac pacemaker: Secondary | ICD-10-CM

## 2014-08-28 DIAGNOSIS — R609 Edema, unspecified: Secondary | ICD-10-CM

## 2014-08-28 DIAGNOSIS — E785 Hyperlipidemia, unspecified: Secondary | ICD-10-CM

## 2014-08-28 DIAGNOSIS — I10 Essential (primary) hypertension: Secondary | ICD-10-CM

## 2014-08-28 LAB — CBC
HCT: 41.7 % (ref 39.0–52.0)
Hemoglobin: 14.3 g/dL (ref 13.0–17.0)
MCHC: 34.2 g/dL (ref 30.0–36.0)
MCV: 91.4 fl (ref 78.0–100.0)
Platelets: 237 10*3/uL (ref 150.0–400.0)
RBC: 4.56 Mil/uL (ref 4.22–5.81)
RDW: 14 % (ref 11.5–15.5)
WBC: 7.7 10*3/uL (ref 4.0–10.5)

## 2014-08-28 LAB — BASIC METABOLIC PANEL
BUN: 10 mg/dL (ref 6–23)
CO2: 27 mEq/L (ref 19–32)
Calcium: 9.6 mg/dL (ref 8.4–10.5)
Chloride: 99 mEq/L (ref 96–112)
Creatinine, Ser: 1.08 mg/dL (ref 0.40–1.50)
GFR: 71.62 mL/min (ref >60.00)
Glucose, Bld: 110 mg/dL — ABNORMAL HIGH (ref 70–99)
Potassium: 4.1 mEq/L (ref 3.5–5.1)
Sodium: 132 mEq/L — ABNORMAL LOW (ref 135–145)

## 2014-08-28 LAB — BRAIN NATRIURETIC PEPTIDE: Pro B Natriuretic peptide (BNP): 61 pg/mL (ref 0.0–100.0)

## 2014-08-28 NOTE — Progress Notes (Signed)
CARDIOLOGY OFFICE NOTE  Date:  08/28/2014    [name] [name] Date of Birth: 1946/04/29 Medical Record #[id number]  PCP:  [name], WILLIAM, MD  Cardiologist:  Graciela HusbandsKlein    Chief Complaint  Patient presents with  . Edema    Work in visit - seen for Dr. Graciela HusbandsKlein     History of Present Illness: [name] ColaceMarion C [name] is a 69 y.o. male who presents today for a work in visit. Seen for Dr. Graciela HusbandsKlein. 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   Seen here in September of 2014 for pre op visit/surgical clearance. Myoview ordered - low risk. Also noted to have a lung nodule and CT was ordered to further define. This was negative.   I last saw in April of 2015 - BP was up. Using too much salt. Had had his losartan increased - then Norvasc added. He did not take this but for just a short time due to swelling.  Phone call last week - Daughter, Zella BallRobin, calling stating her father has been retaining fluid since his d/c from hospital in March. No SOB but is having swelling in ankles. Does not think he has had any wt gain but she has told him to write his wt down every day. When he was d/c in March Dr. Tiburcio PeaHarris stopped his Triam/HCTZ because Na was low and put him back on Norvasc. States Mr. Penne Lashssacs did not start Norvasc because side effect is swelling in ankles. States his BP is good so he decreased his Benicar to 20 mg daily. States they are trying to keep him healthy because he is scheduled for a hip replacement last of May. They have had to cancel a couple of times. Wants to be seen as soon as possible either with Sunday SpillersLori Maddilyn Campus,NP or Dr. Graciela HusbandsKlein. Spoke w/Decorey Wahlert who is flex on Monday and she authorized him to be seen. Made an appointment for 9:00. Advised daughter of time and suggested he bring his current medications for review.  Thus added to the FLEX.  Comes back today. Here with his daughter [name]- Robin. She gives most of the  history - I do not get the impression that he would have come here if it weren't for her. He had lots of issues with bronchitis/URI - on and off steroids and antibiotics this past winter. Has been admitted to the hospital - last time in March. Breathing has gotten better but still with DOE. Now with more swelling as well. Off oxygen for the past week or so. Apparently before he left the hospital this last time - BP was up - started back on Norvasc - they did not pick up RX. Saw Dr. Tiburcio PeaHarris - noted to have low sodium level and his Maxzide was stopped - then has more swelling - now just back on HCTZ 12.5. His BP was "low" so he cut his ARB back on his own. Drinking lots of water. No chest pain. Trying to get his hip surgery in the latter part of May. She is upset because he does not "eat right" and does not follow her advice.    Past Medical History  Diagnosis Date  . Hypertension   . Headache(784.0)   . Bradycardia     pacemaker - Medtronic Adapta #ADDRO1 - December 22, 2005  . Hyperlipidemia   . Depression   . Insomnia   . Chronic back pain   .  Anginal pain   . Dysrhythmia     pt states I have when they check the pacemaker,  . COPD (chronic obstructive pulmonary disease)   . Pneumonia     had two times unsure of when  . Diabetes mellitus     type II controled with diet  . Pacemaker     Medtronic Dr. Graciela Husbands   . GERD (gastroesophageal reflux disease)   . Arthritis   . Neuromuscular disorder     peripheral neuropathy BLE  . Hx of cardiovascular stress test     Lexiscan Myoview (9/14):  Low risk, small mild basal to mid inferior perfusion defect (suspected diaphragmatic attenuation), no ischemia, EF 57%    Past Surgical History  Procedure Laterality Date  . Back surgery    . Hemorrhoid surgery    . Pacemaker insertion  ?2007  . Insert / replace / remove pacemaker    . Total hip arthroplasty Right 01/25/2013    Procedure: TOTAL HIP ARTHROPLASTY ANTERIOR APPROACH;  Surgeon: Velna Ochs, MD;  Location: [facility] OR;  Service: Orthopedics;  Laterality: Right;     Medications: Current Outpatient Prescriptions  Medication Sig Dispense Refill  . acetaminophen (TYLENOL) 650 MG CR tablet Take 650 mg by mouth every 8 (eight) hours as needed for pain.    Ailene Ards ELLIPTA 62.5-25 MCG/INH AEPB     . atorvastatin (LIPITOR) 40 MG tablet Take 40 mg by mouth daily.    [name] BENICAR 40 MG tablet Take 1 tablet by mouth daily.    [name] docusate sodium (COLACE) 100 MG capsule Take 100 mg by mouth daily.    . famotidine (PEPCID) 20 MG tablet TAKE ONE TABLET BY MOUTH ONCE DAILY AT BEDTIME 30 tablet 0  . fluticasone (FLOVENT HFA) 110 MCG/ACT inhaler 2 puffs then rinse mouth, twice daily 1 Inhaler 12  . gabapentin (NEURONTIN) 300 MG capsule Take 300 mg by mouth 2 (two) times daily.    [name] glimepiride (AMARYL) 1 MG tablet Take 1 tablet by mouth daily.    . hydrochlorothiazide (HYDRODIURIL) 12.5 MG tablet Take 1 tablet by mouth daily.    [name] ipratropium-albuterol (DUONEB) 0.5-2.5 (3) MG/3ML SOLN     . levocetirizine (XYZAL) 5 MG tablet Take 1 tablet by mouth daily.    . montelukast (SINGULAIR) 10 MG tablet Take 1 tablet by mouth daily.    . Multiple Vitamin (MULTIVITAMIN) tablet Take 1 tablet by mouth daily.    . pantoprazole (PROTONIX) 40 MG tablet TAKE ONE TABLET BY MOUTH ONCE DAILY TAKE  30-60  MINUTES  BEFORE  FIRST  MEAL  OF  THE  DAY 30 tablet 0  . PROAIR HFA 108 (90 BASE) MCG/ACT inhaler Inhale 2 puffs into the lungs 4 (four) times daily as needed.    [name] ipratropium (ATROVENT) 0.02 % nebulizer solution Take 0.5 mg by nebulization every 6 (six) hours as needed for wheezing or shortness of breath.    . ONE TOUCH ULTRA TEST test strip     . vitamin C (ASCORBIC ACID) 500 MG tablet Take 1,000 mg by mouth daily.     . [DISCONTINUED] cloNIDine (CATAPRES) 0.1 MG tablet Take 1 tablet (0.1 mg total) by mouth 2 (two) times daily. 60 tablet 11   No current facility-administered medications for this visit.     Allergies: Allergies  Allergen Reactions  . Penicillins Anaphylaxis  . Metoprolol Tartrate     HR drops too low  . Codeine Rash    Social History: The  patient  reports that he quit smoking about 41 years ago. His smoking use included Cigarettes. He started smoking about 56 years ago. He has a 30 pack-year smoking history. He has never used smokeless tobacco. He reports that he drinks alcohol. He reports that he does not use illicit drugs.   Family History: The patient's family history includes Allergies in an other family member; Asthma in his maternal aunt; Heart disease in his mother.   Review of Systems: Please see the history of present illness.   Otherwise, the review of systems is positive for unexplained weight gain, leg swelling and DOE.   All other systems are reviewed and negative.   Physical Exam: VS:  BP 148/92 mmHg  Pulse 68  Resp 20  Ht 6' (1.829 m)  Wt 228 lb (103.42 kg)  BMI 30.92 kg/m2  SpO2 96% .  BMI Body mass index is 30.92 kg/(m^2).   Repeat BP by me is 170/90.  Wt Readings from Last 3 Encounters:  08/28/14 228 lb (103.42 kg)  08/11/14 223 lb 6.4 oz (101.334 kg)  07/17/14 222 lb (100.699 kg)    General: Pleasant. Well developed, well nourished and in no acute distress. Weight is up.  HEENT: Normal. Neck: Supple, no JVD, carotid bruits, or masses noted.  Cardiac: Regular rate and rhythm. No murmurs, rubs, or gallops. Trace edema.  Respiratory:  Lungs are clear to auscultation bilaterally with normal work of breathing.  GI: Soft and nontender.  MS: No deformity or atrophy. Gait and ROM intact. Skin: Warm and dry. Color is normal.  Neuro:  Strength and sensation are intact and no gross focal deficits noted.  Psych: Alert, appropriate and with normal affect.   LABORATORY DATA:  EKG:  EKG is ordered today. This demonstrates A pacing.  Lab Results  Component Value Date   WBC 21.6* 07/18/2014   HGB 12.6* 07/18/2014   HCT 37.1* 07/18/2014    PLT 197 07/18/2014   GLUCOSE 238* 07/19/2014   NA 129* 07/19/2014   K 4.6 07/19/2014   CL 91* 07/19/2014   CREATININE 1.42* 07/19/2014   BUN 25* 07/19/2014   CO2 24 07/19/2014   INR 0.91 01/17/2013    BNP (last 3 results) No results for input(s): BNP in the last 8760 hours.  ProBNP (last 3 results) No results for input(s): PROBNP in the last 8760 hours.   Other Studies Reviewed Today:   Assessment/Plan: 1. DOE with swelling - most likely multifactorial but will check BNP and arrange echo. Encouraged him to use support stockings. No change is his medicines for now.   2. Hyponatremia - back on HCTZ - this may need to be stopped. Recheck lab today and have advised him to limit his fluids.  3. HTN - not controlled - will increase the ARB back to full dose  4. PPM in place - needs to get his recall OV with Dr. Graciela Husbands arranged.  5. Upcoming [facility] surgery at [facility]  6. COPD - sounds like with lots of exacerbations this past winter.   Current medicines are reviewed with the patient today.  The patient does not have concerns regarding medicines other than what has been noted above.  The following changes have been made:  See above.  Labs/ tests ordered today include:    Orders Placed This Encounter  Procedures  . Brain natriuretic peptide  . Basic metabolic panel  . CBC  . EKG 12-Lead  . 2D Echocardiogram without contrast     Disposition:  Further disposition to follow.    Patient is agreeable to this plan and will call if any problems develop in the interim.   Signed: Rosalio Macadamia, RN, ANP-C 08/28/2014 9:36 AM  [facility] [address] [place], [place]  [address] Phone: [phone] Fax: [phone]

## 2014-08-28 NOTE — Patient Instructions (Addendum)
We will be checking the following labs today - BMET, CBC, BNP   Medication Instructions:    Continue with your current medicines and go back to the full dose of Benicar    Testing/Procedures To Be Arranged:  ECHOCARDIOGRAM  Follow-Up:   Arrange recall with Dr. Graciela HusbandsKlein for June with pacemaker check    Other Special Instructions:   Knee high support stockings - wear each day  Limit your fluid intake - to less than 2 liters per day  Call the [facility] office at [phone] if you have any questions, problems or concerns.

## 2014-08-29 ENCOUNTER — Ambulatory Visit (HOSPITAL_COMMUNITY): Payer: Medicare Other | Attending: Cardiology

## 2014-08-29 DIAGNOSIS — I10 Essential (primary) hypertension: Secondary | ICD-10-CM | POA: Diagnosis not present

## 2014-08-29 DIAGNOSIS — R609 Edema, unspecified: Secondary | ICD-10-CM | POA: Diagnosis present

## 2014-08-29 DIAGNOSIS — E785 Hyperlipidemia, unspecified: Secondary | ICD-10-CM

## 2014-08-29 DIAGNOSIS — R600 Localized edema: Secondary | ICD-10-CM

## 2014-08-29 DIAGNOSIS — Z950 Presence of cardiac pacemaker: Secondary | ICD-10-CM | POA: Insufficient documentation

## 2014-08-29 NOTE — Progress Notes (Signed)
2D Echo completed. 08/29/2014 

## 2014-09-12 ENCOUNTER — Encounter: Payer: Self-pay | Admitting: Cardiology

## 2014-10-03 DIAGNOSIS — T849XXA Unspecified complication of internal orthopedic prosthetic device, implant and graft, initial encounter: Secondary | ICD-10-CM | POA: Insufficient documentation

## 2014-10-03 DIAGNOSIS — E119 Type 2 diabetes mellitus without complications: Secondary | ICD-10-CM | POA: Insufficient documentation

## 2014-10-03 DIAGNOSIS — I10 Essential (primary) hypertension: Secondary | ICD-10-CM | POA: Insufficient documentation

## 2014-10-03 DIAGNOSIS — J309 Allergic rhinitis, unspecified: Secondary | ICD-10-CM | POA: Insufficient documentation

## 2014-10-03 DIAGNOSIS — E785 Hyperlipidemia, unspecified: Secondary | ICD-10-CM | POA: Insufficient documentation

## 2014-10-30 ENCOUNTER — Other Ambulatory Visit: Payer: Self-pay

## 2014-11-03 ENCOUNTER — Ambulatory Visit (INDEPENDENT_AMBULATORY_CARE_PROVIDER_SITE_OTHER): Payer: Medicare Other | Admitting: Internal Medicine

## 2014-11-03 ENCOUNTER — Encounter: Payer: Self-pay | Admitting: Internal Medicine

## 2014-11-03 VITALS — BP 140/84 | HR 92 | Ht 71.5 in | Wt 221.8 lb

## 2014-11-03 DIAGNOSIS — Z45018 Encounter for adjustment and management of other part of cardiac pacemaker: Secondary | ICD-10-CM

## 2014-11-03 DIAGNOSIS — I495 Sick sinus syndrome: Secondary | ICD-10-CM | POA: Diagnosis not present

## 2014-11-03 NOTE — Patient Instructions (Addendum)
Medication Instructions:  Your physician recommends that you continue on your current medications as directed. Please refer to the Current Medication list given to you today.  Labwork: None ordered  Testing/Procedures: None ordered  Follow-Up: Remote monitoring is used to monitor your ICD from home. This monitoring reduces the number of office visits required to check your device to one time per year. It allows us to keep an eye on the functioning of your device to ensure it is working properly. You are scheduled for a device check from home on 12/04/2014. You may send your transmission at any time that day. If you have a wireless device, the transmission will be sent automatically. After your physician reviews your transmission, you will receive a postcard with your next transmission date.  Your physician recommends that you schedule a follow-up appointment in: 12 months with Dr.Klein   Any Other Special Instructions Will Be Listed Below (If Applicable). Thank you for choosing [facility]!!

## 2014-11-03 NOTE — Progress Notes (Signed)
Patient Care Team: [name] BlamerWilliam Harris, MD as PCP - General (Family Medicine)   HPI  [name] ColaceMarion C [name] is a 69 y.o. male Seen in followup for pacemaker implanted for chronotropic incompetence and sinus node dysfunction by Dr. Erie NoeJE.  He has no major complaints of shortness of breath or exercise intolerance that are not related to his hip. He underwent hip replacement surgery has had significant problems since that time;  He recently underwent revision with marked improvement   He is on IV antibiotics  He has a history of diabetes hypertension and COPD. Myoview 9/14 was low risk.  Past Medical History  Diagnosis Date  . Hypertension   . Headache(784.0)   . Bradycardia     pacemaker - Medtronic Adapta #ADDRO1 - December 22, 2005  . Hyperlipidemia   . Depression   . Insomnia   . Chronic back pain   . Anginal pain   . Dysrhythmia     pt states I have when they check the pacemaker,  . COPD (chronic obstructive pulmonary disease)   . Pneumonia     had two times unsure of when  . Diabetes mellitus     type II controled with diet  . Pacemaker     Medtronic Dr. Graciela HusbandsKlein   . GERD (gastroesophageal reflux disease)   . Arthritis   . Neuromuscular disorder     peripheral neuropathy BLE  . Hx of cardiovascular stress test     Lexiscan Myoview (9/14):  Low risk, small mild basal to mid inferior perfusion defect (suspected diaphragmatic attenuation), no ischemia, EF 57%    Past Surgical History  Procedure Laterality Date  . Back surgery    . Hemorrhoid surgery    . Pacemaker insertion  ?2007  . Insert / replace / remove pacemaker    . Total hip arthroplasty Right 01/25/2013    Procedure: TOTAL HIP ARTHROPLASTY ANTERIOR APPROACH;  Surgeon: [name] OchsPeter G Dalldorf, MD;  Location: [facility] OR;  Service: Orthopedics;  Laterality: Right;    Current Outpatient Prescriptions  Medication Sig Dispense Refill  . acetaminophen (TYLENOL) 650 MG CR tablet Take 650 mg by mouth every 8 (eight) hours as needed  for pain.    Ailene Ards. ANORO ELLIPTA 62.5-25 MCG/INH AEPB     . aspirin EC 81 MG tablet Take 81 mg by mouth 2 (two) times daily.    [name]. atorvastatin (LIPITOR) 40 MG tablet Take 40 mg by mouth daily.    [name]. BENICAR 40 MG tablet Take 1 tablet by mouth daily.    [name]. docusate sodium ([name]) 100 MG capsule Take 100 mg by mouth daily.    . famotidine (PEPCID) 20 MG tablet TAKE ONE TABLET BY MOUTH ONCE DAILY AT BEDTIME 30 tablet 0  . fluticasone (FLOVENT HFA) 110 MCG/ACT inhaler 2 puffs then rinse mouth, twice daily 1 Inhaler 12  . gabapentin (NEURONTIN) 300 MG capsule Take 300 mg by mouth 2 (two) times daily.    [name]. glimepiride (AMARYL) 1 MG tablet Take 1 tablet by mouth daily.    . hydrochlorothiazide (HYDRODIURIL) 12.5 MG tablet Take 1 tablet by mouth daily.    [name]. ipratropium (ATROVENT) 0.02 % nebulizer solution Take 0.5 mg by nebulization every 6 (six) hours as needed for wheezing or shortness of breath.    [name]. ipratropium-albuterol (DUONEB) 0.5-2.5 (3) MG/3ML SOLN     . levocetirizine (XYZAL) 5 MG tablet Take 1 tablet by mouth daily.    . montelukast (SINGULAIR) 10 MG tablet Take 1  tablet by mouth daily.    . Multiple Vitamin (MULTIVITAMIN) tablet Take 1 tablet by mouth daily.    . ONE TOUCH ULTRA TEST test strip     . pantoprazole (PROTONIX) 40 MG tablet TAKE ONE TABLET BY MOUTH ONCE DAILY TAKE  30-60  MINUTES  BEFORE  FIRST  MEAL  OF  THE  DAY 30 tablet 0  . PROAIR HFA 108 (90 BASE) MCG/ACT inhaler Inhale 2 puffs into the lungs 4 (four) times daily as needed.    . vitamin C (ASCORBIC ACID) 500 MG tablet Take 1,000 mg by mouth daily.     . [DISCONTINUED] cloNIDine (CATAPRES) 0.1 MG tablet Take 1 tablet (0.1 mg total) by mouth 2 (two) times daily. 60 tablet 11   No current facility-administered medications for this visit.    Allergies  Allergen Reactions  . Penicillins Anaphylaxis  . Metoprolol Tartrate     HR drops too low  . Codeine Rash    Review of Systems negative except from HPI and  PMH  Physical Exam BP 140/84 mmHg  Pulse 92  Ht 5' 11.5" (1.816 m)  Wt 221 lb 12.8 oz (100.608 kg)  BMI 30.51 kg/m2 Well developed and well nourished in no acute distress HENT normal E scleral and icterus clear Neck Supple JVP flat; carotids brisk and full Clear to ausculation  Regular rate and rhythm, +S4Soft with active bowel sounds No clubbing cyanosis  Edema Alert and oriented, grossly normal motor and sensory function Skin Warm and Dry    Assessment and  Plan  Sinus node dysfunction  Pacemaker Medtronic  Hypertension   Overall cardiac status is stable. Pacemaker function was normal. Approaching ERI. Estimated 1 1 months. We will see him in one year.  Is no evidence of volume overload.  We will establish follow-up for monthly basis.

## 2014-11-13 LAB — CUP PACEART INCLINIC DEVICE CHECK
Battery Impedance: 3660 Ohm
Battery Remaining Longevity: 11
Battery Voltage: 2.69 V
Brady Statistic AP VP Percent: 0.7 %
Brady Statistic AS VS Percent: 41.7 %
Lead Channel Impedance Value: 453 Ohm
Lead Channel Pacing Threshold Amplitude: 0.75 V
Lead Channel Pacing Threshold Amplitude: 1 V
Lead Channel Pacing Threshold Pulse Width: 0.4 ms
Lead Channel Pacing Threshold Pulse Width: 0.4 ms
Lead Channel Sensing Intrinsic Amplitude: 2 mV
Lead Channel Sensing Intrinsic Amplitude: 4 mV
Lead Channel Setting Pacing Amplitude: 2 V
Lead Channel Setting Pacing Amplitude: 2.5 V
Lead Channel Setting Sensing Sensitivity: 1 mV
MDC IDC MSMT LEADCHNL RV IMPEDANCE VALUE: 582 Ohm
MDC IDC SESS DTM: 20160711130001
MDC IDC SET LEADCHNL RV PACING PULSEWIDTH: 0.4 ms
MDC IDC STAT BRADY AP VS PERCENT: 57.4 %
MDC IDC STAT BRADY AS VP PERCENT: 0.1 %

## 2014-12-04 ENCOUNTER — Encounter: Payer: Medicare Other | Admitting: *Deleted

## 2014-12-05 ENCOUNTER — Telehealth: Payer: Self-pay | Admitting: Internal Medicine

## 2014-12-05 NOTE — Telephone Encounter (Signed)
LMOVM for pt informing him to send remote transmission once he receives his adapter.

## 2014-12-05 NOTE — Telephone Encounter (Signed)
New message       Pt states they cannot send a remote transmission because they are waiting for another box to be delivered

## 2014-12-06 ENCOUNTER — Encounter: Payer: Self-pay | Admitting: Cardiology

## 2014-12-15 ENCOUNTER — Ambulatory Visit (INDEPENDENT_AMBULATORY_CARE_PROVIDER_SITE_OTHER): Payer: Medicare Other | Admitting: *Deleted

## 2014-12-15 ENCOUNTER — Encounter: Payer: Self-pay | Admitting: Internal Medicine

## 2014-12-15 ENCOUNTER — Ambulatory Visit (INDEPENDENT_AMBULATORY_CARE_PROVIDER_SITE_OTHER): Payer: Medicare Other | Admitting: Internal Medicine

## 2014-12-15 ENCOUNTER — Telehealth: Payer: Self-pay | Admitting: Internal Medicine

## 2014-12-15 VITALS — BP 138/80 | HR 66 | Ht 72.0 in | Wt 215.0 lb

## 2014-12-15 DIAGNOSIS — J449 Chronic obstructive pulmonary disease, unspecified: Secondary | ICD-10-CM

## 2014-12-15 DIAGNOSIS — J4489 Other specified chronic obstructive pulmonary disease: Secondary | ICD-10-CM

## 2014-12-15 DIAGNOSIS — R058 Other specified cough: Secondary | ICD-10-CM

## 2014-12-15 DIAGNOSIS — R05 Cough: Secondary | ICD-10-CM | POA: Diagnosis not present

## 2014-12-15 DIAGNOSIS — Z45010 Encounter for checking and testing of cardiac pacemaker pulse generator [battery]: Secondary | ICD-10-CM

## 2014-12-15 DIAGNOSIS — J9601 Acute respiratory failure with hypoxia: Secondary | ICD-10-CM

## 2014-12-15 NOTE — Assessment & Plan Note (Signed)
Improved, no longer needing O2

## 2014-12-15 NOTE — Assessment & Plan Note (Signed)
Bronchitis component is improved and he is probably at baseline now. {Plan- continue present meds, schedule full PFT

## 2014-12-15 NOTE — Telephone Encounter (Signed)
New message      Returning [name] [name]'call

## 2014-12-15 NOTE — Telephone Encounter (Signed)
Follow up      Calling to give another number for [name] [name] to call

## 2014-12-15 NOTE — Patient Instructions (Signed)
We can continue present meds  Please call if we can help  Order- schedule PFT   Dx COPD with chronic bronchitis

## 2014-12-15 NOTE — Progress Notes (Signed)
PPM remote received.  

## 2014-12-15 NOTE — Telephone Encounter (Signed)
Pt's recent attempt to send a remote was not successful. Pt called from daughter's house where he sends remotes. Pt has a Field seismologist. We attempted preliminary trouble shooting but were not successful. Gave pt tech svcs # for further trouble shooting.

## 2014-12-15 NOTE — Progress Notes (Signed)
08/11/14 - 69 yoM with COPD, chronic cough changing to my care, complicated by Cardiac dysrhythmia/ pacemaker, HBP    Daughter here Referred courtesy of Dr. Tiburcio Pea [facility] 3/13-16/2016 AECOPD, Acute Resp Failure. Discharged on levaquin, prednisone Changing from MW to CY-cough. Trying to get better to get hip surgery to correct hip from previous surgery.  He describes nonproductive cough and shortness of breath that have come and gone over the past 2 years. He had his daughter associate onset with a right hip surgery that didn't go well and requires surgical revision now. He quit smoking in 1975. Remote pneumonia. Never ventilator support. No history of DVT or pulmonary embolism and no diagnosis of asthma. Hospital for acute exacerbation recently, triggered by working outdoors in wind and pollen. Finished Levaquin and prednisone yesterday. Denies choking with food or drink. He does not wake at night coughing or short of breath. Currently using nebulizer with DuoNeb, Anoro inhaler, using pro-air rescue inhaler 3 or 4 times daily, and Singulair. Steroids help. Has used oxygen at 2 L for sleep and when necessary/Advanced since hospital stay. Walking is limited by hip pain before dyspnea. Several in family with asthma and allergy problems. 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 CXR 07/18/14 FINDINGS: The heart size and mediastinal contours are within normal limits. Both lungs are clear. No pneumothorax or pleural effusion is noted. Left-sided pacemaker is unchanged in position. The visualized skeletal structures are unremarkable. IMPRESSION: No active cardiopulmonary disease. Electronically Signed  By: Lupita Raider, M.D.  On: 07/18/2014 10:53 Office Spirometry 08/11/2014-moderate restriction of exhaled volume. FVC 2.98/62%, FEV1 2.37/65%, FEV1/FVC 0.79, FEF 25-75 percent 2.43/77%.  12/14/14-69 yoM with COPD, chronic cough  changing to my care, complicated by Cardiac dysrhythmia/ pacemaker, HBP FOLLOWS FOR: pt has no complaints today.   No longer has home O2 Little cough, feeling stable and comfortable. Continues Anoro, Flovent 110, Singulair, Neb/albuterol, Proair.  ROS-see HPI   Negative unless "+" Constitutional:    weight loss, night sweats, fevers, chills, fatigue, lassitude. HEENT:    headaches, difficulty swallowing, tooth/dental problems, sore throat,       sneezing, itching, ear ache, nasal congestion, post nasal drip, snoring CV:    chest pain, orthopnea, PND, swelling in lower extremities, anasarca,                                                           dizziness, palpitations Resp:   +shortness of breath with exertion or at rest.                productive cough,   non-productive cough, coughing up of blood.              change in color of mucus.  wheezing.   Skin:    rash or lesions. GI:  No-   heartburn, indigestion, abdominal pain, nausea, vomiting, GU:  MS:   joint pain, stiffness,  Neuro-     nothing unusual Psych:  change in mood or affect.  depression or anxiety.   memory loss.  OBJ- Physical Exam General- Alert, Oriented, Affect-appropriate, Distress- none acute Skin- rash-none, lesions- none, excoriation- none Lymphadenopathy- none Head- atraumatic            Eyes- Gross  vision intact, PERRLA, conjunctivae and secretions clear            Ears- Hearing, canals-normal            Nose- Clear, no-Septal dev, mucus, polyps, erosion, perforation             Throat- Mallampati II , mucosa clear , drainage- none, tonsils- atrophic, edentulous+ Neck- flexible , trachea midline, no stridor , thyroid nl, carotid no bruit Chest - symmetrical excursion , unlabored           Heart/CV- RRR , no murmur , no gallop  , no rub, nl s1 s2                           - JVD- none , edema- none, stasis changes- none, varices- none           Lung- clear to P&A, wheeze-none, cough-none, dullness-none, rub-  none           Chest wall- +L pacemaker Abd-  Br/ Gen/ Rectal- Not done, not indicated Extrem- cyanosis- none, clubbing, none, atrophy- none, strength- nl, +cane Neuro- grossly intact to observation

## 2014-12-15 NOTE — Assessment & Plan Note (Signed)
Cough is much improved.

## 2014-12-16 LAB — CUP PACEART REMOTE DEVICE CHECK
Battery Impedance: 4180 Ohm
Brady Statistic AP VP Percent: 0 %
Brady Statistic AP VS Percent: 76 %
Brady Statistic AS VP Percent: 0 %
Brady Statistic AS VS Percent: 24 %
Date Time Interrogation Session: 20160812171037
Lead Channel Impedance Value: 496 Ohm
Lead Channel Pacing Threshold Pulse Width: 0.4 ms
Lead Channel Pacing Threshold Pulse Width: 0.4 ms
Lead Channel Sensing Intrinsic Amplitude: 2.8 mV
Lead Channel Setting Pacing Amplitude: 2 V
Lead Channel Setting Pacing Amplitude: 2.5 V
Lead Channel Setting Pacing Pulse Width: 0.4 ms
MDC IDC MSMT BATTERY REMAINING LONGEVITY: 8 mo
MDC IDC MSMT BATTERY VOLTAGE: 2.68 V
MDC IDC MSMT LEADCHNL RA PACING THRESHOLD AMPLITUDE: 1 V
MDC IDC MSMT LEADCHNL RV IMPEDANCE VALUE: 590 Ohm
MDC IDC MSMT LEADCHNL RV PACING THRESHOLD AMPLITUDE: 1 V
MDC IDC SET LEADCHNL RV SENSING SENSITIVITY: 1 mV

## 2015-01-12 ENCOUNTER — Encounter: Payer: Self-pay | Admitting: *Deleted

## 2015-01-22 ENCOUNTER — Encounter: Payer: Self-pay | Admitting: Internal Medicine

## 2015-01-25 ENCOUNTER — Emergency Department (HOSPITAL_COMMUNITY)
Admission: EM | Admit: 2015-01-25 | Discharge: 2015-01-25 | Disposition: A | Discharge status: Other Acute Inpatient Hospital | Payer: Medicare Other | Attending: Emergency Medicine | Admitting: Emergency Medicine

## 2015-01-25 ENCOUNTER — Emergency Department (HOSPITAL_COMMUNITY): Payer: Medicare Other

## 2015-01-25 ENCOUNTER — Encounter (HOSPITAL_COMMUNITY): Payer: Self-pay | Admitting: Emergency Medicine

## 2015-01-25 ENCOUNTER — Encounter (HOSPITAL_COMMUNITY)
Admission: EM | Disposition: A | Discharge status: Other Acute Inpatient Hospital | Payer: Self-pay | Source: Home / Self Care | Attending: Emergency Medicine

## 2015-01-25 DIAGNOSIS — Z880 Allergy status to penicillin: Secondary | ICD-10-CM | POA: Diagnosis not present

## 2015-01-25 DIAGNOSIS — E119 Type 2 diabetes mellitus without complications: Secondary | ICD-10-CM | POA: Diagnosis not present

## 2015-01-25 DIAGNOSIS — K219 Gastro-esophageal reflux disease without esophagitis: Secondary | ICD-10-CM | POA: Insufficient documentation

## 2015-01-25 DIAGNOSIS — Z87891 Personal history of nicotine dependence: Secondary | ICD-10-CM | POA: Insufficient documentation

## 2015-01-25 DIAGNOSIS — Z79899 Other long term (current) drug therapy: Secondary | ICD-10-CM | POA: Diagnosis not present

## 2015-01-25 DIAGNOSIS — Z7951 Long term (current) use of inhaled steroids: Secondary | ICD-10-CM | POA: Diagnosis not present

## 2015-01-25 DIAGNOSIS — G709 Myoneural disorder, unspecified: Secondary | ICD-10-CM | POA: Diagnosis not present

## 2015-01-25 DIAGNOSIS — S79911A Unspecified injury of right hip, initial encounter: Secondary | ICD-10-CM | POA: Diagnosis present

## 2015-01-25 DIAGNOSIS — X58XXXA Exposure to other specified factors, initial encounter: Secondary | ICD-10-CM | POA: Diagnosis not present

## 2015-01-25 DIAGNOSIS — E785 Hyperlipidemia, unspecified: Secondary | ICD-10-CM | POA: Diagnosis not present

## 2015-01-25 DIAGNOSIS — Y998 Other external cause status: Secondary | ICD-10-CM | POA: Insufficient documentation

## 2015-01-25 DIAGNOSIS — G4700 Insomnia, unspecified: Secondary | ICD-10-CM | POA: Insufficient documentation

## 2015-01-25 DIAGNOSIS — G8929 Other chronic pain: Secondary | ICD-10-CM | POA: Insufficient documentation

## 2015-01-25 DIAGNOSIS — Z950 Presence of cardiac pacemaker: Secondary | ICD-10-CM | POA: Diagnosis not present

## 2015-01-25 DIAGNOSIS — I10 Essential (primary) hypertension: Secondary | ICD-10-CM | POA: Insufficient documentation

## 2015-01-25 DIAGNOSIS — J449 Chronic obstructive pulmonary disease, unspecified: Secondary | ICD-10-CM | POA: Insufficient documentation

## 2015-01-25 DIAGNOSIS — S73004A Unspecified dislocation of right hip, initial encounter: Secondary | ICD-10-CM | POA: Diagnosis not present

## 2015-01-25 DIAGNOSIS — I209 Angina pectoris, unspecified: Secondary | ICD-10-CM | POA: Insufficient documentation

## 2015-01-25 DIAGNOSIS — Y9289 Other specified places as the place of occurrence of the external cause: Secondary | ICD-10-CM | POA: Diagnosis not present

## 2015-01-25 DIAGNOSIS — M1990 Unspecified osteoarthritis, unspecified site: Secondary | ICD-10-CM | POA: Insufficient documentation

## 2015-01-25 DIAGNOSIS — Y9389 Activity, other specified: Secondary | ICD-10-CM | POA: Insufficient documentation

## 2015-01-25 DIAGNOSIS — Z8701 Personal history of pneumonia (recurrent): Secondary | ICD-10-CM | POA: Diagnosis not present

## 2015-01-25 DIAGNOSIS — F329 Major depressive disorder, single episode, unspecified: Secondary | ICD-10-CM | POA: Insufficient documentation

## 2015-01-25 DIAGNOSIS — Z7982 Long term (current) use of aspirin: Secondary | ICD-10-CM | POA: Diagnosis not present

## 2015-01-25 SURGERY — CLOSED REDUCTION, HIP
Anesthesia: Choice | Laterality: Right

## 2015-01-25 MED ORDER — FENTANYL CITRATE (PF) 100 MCG/2ML IJ SOLN
100.0000 ug | Freq: Once | INTRAMUSCULAR | Status: AC
  Administered 2015-01-25: 100 ug via INTRAVENOUS
  Filled 2015-01-25: qty 2

## 2015-01-25 MED ORDER — PROPOFOL 10 MG/ML IV BOLUS
0.5000 mg/kg | Freq: Once | INTRAVENOUS | Status: AC
  Administered 2015-01-25: 48.8 mg via INTRAVENOUS
  Filled 2015-01-25: qty 20

## 2015-01-25 MED ORDER — KETAMINE HCL 10 MG/ML IJ SOLN
1.0000 mg/kg | Freq: Once | INTRAMUSCULAR | Status: AC
  Administered 2015-01-25: 98 mg via INTRAVENOUS
  Filled 2015-01-25: qty 9.8

## 2015-01-25 MED ORDER — FENTANYL CITRATE (PF) 100 MCG/2ML IJ SOLN
50.0000 ug | INTRAMUSCULAR | Status: AC | PRN
  Administered 2015-01-25 (×2): 50 ug via INTRAVENOUS
  Filled 2015-01-25 (×2): qty 2

## 2015-01-25 NOTE — Sedation Documentation (Signed)
Pt resting, confused and talking to/calling out for Daisy. Will continue to monitor. Responsive to pain. Maintaining O2 sats at 96-100% on 2L [place]

## 2015-01-25 NOTE — ED Provider Notes (Signed)
CSN: [id number]     Arrival date & time 01/25/15  0540 History   First MD Initiated Contact with Patient 01/25/15 0559     Chief Complaint  Patient presents with  . Hip Injury   HPI  [name] [name] is a 69 year old male with PMHx of HTN, bradycardia with pacemaker, COPD and DM2 presenting with hip injury. Pt states he was in bed when he attempted to roll to his left side and felt his hip dislocate. He reports 10/10 hip pain. He is unable to move his right hip and knee 2/2 to pain. Pt states he has peripheral neuropathy from DM so has baseline sensation decreases over BLE. Denies loss of sensation, numbness or coolness of his right extremity. He states he can still move his foot without significant pain. Pt reports that his hip has dislocated before. He had his original THR by [employer] in 2014 and then had a hip revision by an orthopedic doctor in [place] in June 2016.   Past Medical History  Diagnosis Date  . Hypertension   . Headache(784.0)   . Bradycardia     pacemaker - Medtronic Adapta #ADDRO1 - December 22, 2005  . Hyperlipidemia   . Depression   . Insomnia   . Chronic back pain   . Anginal pain   . Dysrhythmia     pt states I have when they check the pacemaker,  . COPD (chronic obstructive pulmonary disease)   . Pneumonia     had two times unsure of when  . Diabetes mellitus     type II controled with diet  . Pacemaker     Medtronic Dr. Graciela Husbands   . GERD (gastroesophageal reflux disease)   . Arthritis   . Neuromuscular disorder     peripheral neuropathy BLE  . Hx of cardiovascular stress test     Lexiscan Myoview (9/14):  Low risk, small mild basal to mid inferior perfusion defect (suspected diaphragmatic attenuation), no ischemia, EF 57%   Past Surgical History  Procedure Laterality Date  . Back surgery    . Hemorrhoid surgery    . Pacemaker insertion  ?2007  . Insert / replace / remove pacemaker    . Total hip arthroplasty Right 01/25/2013    Procedure: TOTAL  HIP ARTHROPLASTY ANTERIOR APPROACH;  Surgeon: Velna Ochs, MD;  Location: [facility] OR;  Service: Orthopedics;  Laterality: Right;   Family History  Problem Relation Age of Onset  . Heart disease Mother   . Asthma Maternal Aunt   . Allergies      whole family   Social History  Substance Use Topics  . Smoking status: Former Smoker -- 2.00 packs/day for 15 years    Types: Cigarettes    Start date: 05/05/1958    Quit date: 05/05/1973  . Smokeless tobacco: Never Used  . Alcohol Use: 0.0 oz/week    0 Standard drinks or equivalent per week     Comment: beer occ    Review of Systems  Musculoskeletal: Positive for arthralgias and gait problem.  Skin: Negative for color change and pallor.  Neurological: Positive for weakness. Negative for numbness.      Allergies  Penicillins; Metoprolol tartrate; and Codeine  Home Medications   Prior to Admission medications   Medication Sig Start Date End Date Taking? Authorizing Provider  acetaminophen (TYLENOL) 650 MG CR tablet Take 650 mg by mouth every 8 (eight) hours as needed for pain.   Yes Historical Provider, MD  ANORO ELLIPTA 62.5-25 MCG/INH AEPB Inhale 1 puff into the lungs daily.  08/07/14  Yes Historical Provider, MD  aspirin 81 MG tablet Take 81 mg by mouth daily.   Yes Historical Provider, MD  atorvastatin (LIPITOR) 40 MG tablet Take 40 mg by mouth daily.   Yes Historical Provider, MD  BENICAR 40 MG tablet Take 1 tablet by mouth daily. 07/27/14  Yes Historical Provider, MD  docusate sodium (COLACE) 100 MG capsule Take 100 mg by mouth daily.   Yes Historical Provider, MD  doxycycline (VIBRAMYCIN) 100 MG capsule Take 100 mg by mouth 2 (two) times daily.   Yes Historical Provider, MD  famotidine (PEPCID) 20 MG tablet TAKE ONE TABLET BY MOUTH ONCE DAILY AT BEDTIME 06/07/14  Yes Nyoka Cowden, MD  fluticasone [facility] HFA) 110 MCG/ACT inhaler 2 puffs then rinse mouth, twice daily 08/11/14  Yes Waymon Budge, MD  gabapentin (NEURONTIN) 300 MG  capsule Take 300 mg by mouth 2 (two) times daily.   Yes Historical Provider, MD  glimepiride (AMARYL) 1 MG tablet Take 1 tablet by mouth daily. 07/27/14  Yes Historical Provider, MD  ipratropium-albuterol (DUONEB) 0.5-2.5 (3) MG/3ML SOLN Inhale 3 mLs into the lungs every 6 (six) hours as needed (wheezing or shortness of breath).  07/31/14  Yes Historical Provider, MD  levocetirizine (XYZAL) 5 MG tablet Take 1 tablet by mouth daily. 07/25/14  Yes Historical Provider, MD  montelukast (SINGULAIR) 10 MG tablet Take 1 tablet by mouth daily. 07/20/14  Yes Historical Provider, MD  Multiple Vitamin (MULTIVITAMIN WITH MINERALS) TABS tablet Take 1 tablet by mouth daily.   Yes Historical Provider, MD  oxyCODONE-acetaminophen (PERCOCET/ROXICET) 5-325 MG per tablet Take 1 tablet by mouth every 6 (six) hours as needed for severe pain.  01/15/15  Yes Historical Provider, MD  pantoprazole (PROTONIX) 40 MG tablet TAKE ONE TABLET BY MOUTH ONCE DAILY TAKE  30-60  MINUTES  BEFORE  FIRST  MEAL  OF  THE  DAY 06/07/14  Yes Nyoka Cowden, MD  PROAIR HFA 108 (90 BASE) MCG/ACT inhaler Inhale 2 puffs into the lungs 4 (four) times daily as needed. 07/27/14  Yes Historical Provider, MD   BP 165/80 mmHg  Pulse 76  Temp(Src) 98 F (36.7 C) (Oral)  Resp 17  Ht 6' (1.829 m)  Wt 214 lb 15.2 oz (97.5 kg)  BMI 29.15 kg/m2  SpO2 99% Physical Exam  Constitutional: He appears well-developed and well-nourished. No distress.  HENT:  Head: Normocephalic and atraumatic.  Eyes: Conjunctivae are normal. Right eye exhibits no discharge. Left eye exhibits no discharge. No scleral icterus.  Neck: Normal range of motion.  Cardiovascular: Normal rate.   Pedal pulses palpable bilaterally.   Pulmonary/Chest: Effort normal. No respiratory distress.  Musculoskeletal: Normal range of motion.  Pt unable to move right hip or knee. Pt wiggles toes and moves ankle.   Neurological: He is alert.  Unable to test motor strength 2/2 to pain. Sensation to  light touch intact and similar between BLE.   Skin: Skin is warm and dry.  No pallor of right leg  Psychiatric: He has a normal mood and affect. His behavior is normal.  Nursing note and vitals reviewed.   ED Course  Procedures (including critical care time) Labs Review Labs Reviewed - No data to display  Imaging Review Dg Hip Unilat With Pelvis 2-3 Views Right  01/25/2015   CLINICAL DATA:  RIGHT hip pain.  RIGHT dislocation.  EXAM: DG HIP (WITH OR WITHOUT PELVIS)  2-3V RIGHT  COMPARISON:  CT 09/07/2013  FINDINGS: The RIGHT hip prosthetic is dislocated with the femoral component superior to the acetabular component  IMPRESSION: Dislocation of RIGHT hip prosthetic.  No fracture evident.   Electronically Signed   By: Genevive Bi M.D.   On: 01/25/2015 07:26   I have personally reviewed and evaluated these images and lab results as part of my medical decision-making.   EKG Interpretation None      MDM   Final diagnoses:  Hip dislocation, right, initial encounter   Pt presenting with non-traumatic hip dislocation. Assisted Dr. Juleen China with attempted reduction in ED that was unsuccessful. Consulted orthopedics (Dr. Dion Saucier); his PA saw pt in ED to discussed urgent reduction. Pt is now requesting to be transferred to [facility] ED for reduction by his orthopedic surgeon. Had multiple, extensive conversations with patient, patient's daughter and granddaughter about the risks of this. Advised that dislocations need to be urgently reduced and transfer to [facility] will delay by many hours. Discussed that delayed reduction could result in poor outcomes including loss of limb. Pt and family express understanding and still wish to transfer. Accepting attending in [facility] is Dr. Lanell Matar. Pt stable for transfer.     [name] Gala Virgin Zellers, PA-C 01/25/15 1859  Raeford Razor, MD 01/26/15 [phone]

## 2015-01-25 NOTE — ED Notes (Signed)
PA at bedside.

## 2015-01-25 NOTE — ED Notes (Signed)
Pt alert and oriented x 4 - slow with responses and mildly confused.

## 2015-01-25 NOTE — ED Notes (Signed)
Pt with prior right hip replacement/surgery was laying in bed when his hip dislocated this morning.

## 2015-01-25 NOTE — Progress Notes (Signed)
Orthopedic trauma service  Went to see patient in consult for a prosthetic anterior right hip dislocation. On attempt of reduction was made in the ED, unsuccessfully  Patient is status post revision right hip arthroplasty June 2016. This is his second dislocation since surgery. He is wearing a brace and was wearing a brace at the time of his dislocation. Patient states that he was laying in bed and turning over onto his side when his hip dislocated.   At my time of arrival patient was on the phone with his wife who had just gotten off the phone with his Pensions consultant in [place]. They requested transfer to their institution to address his dislocation   This was relayed to the ED attending patient will be transferred to [facility]   We did have the patient on the schedule for a hip reduction I would treat could've done around 10:30 this morning but he prefers to go back to [facility]   Mearl Latin, PA-C [facility] [phone] (P) 01/25/2015 9:04 AM

## 2015-01-25 NOTE — ED Notes (Signed)
Pt a x 4, NAD, VSS on discharge, all belongings sent with pt daughter.  Report called to Marton Redwood, [facility] ED.

## 2015-01-25 NOTE — ED Provider Notes (Signed)
Medical screening examination/treatment/procedure(s) were conducted as a shared visit with non-physician practitioner(s) and myself.  I personally evaluated the patient during the encounter.   EKG Interpretation None     69yM with closed R prosthetic hip dislocation. Sedated and attempted reduction unsuccessfully. Will discuss with ortho.   8:42 AM [name] called very upset and requesting patient be transferred to [facility]. Explained to her that that this time there was no medical necessity to do this. Acknowledged concerns but explained that dislocations need to be reduced expediently and transfer to [facility] would delay definitive care. Explained prior conversation with patient and the plan for me to try and reduce and orthopedics consultation if unsuccessful. Explained that pt had spoken of current orthopedic care at [facility] but never expressed any desire to be transported. Advised that further discussion would be best with orthopedics if she had other concerns.   8:58 AM Apparently family in touch with surgeon at [facility] and would see patient in transfer. Will discuss with surgeon directly to confirm this and transfer per patient/family's wishes.   Raeford Razor, MD 01/25/15 [phone]

## 2015-01-25 NOTE — ED Notes (Signed)
Notified [facility] for transportation to [facility] ER

## 2015-02-05 ENCOUNTER — Ambulatory Visit (INDEPENDENT_AMBULATORY_CARE_PROVIDER_SITE_OTHER): Payer: Medicare Other | Admitting: *Deleted

## 2015-02-05 ENCOUNTER — Telehealth: Payer: Self-pay | Admitting: Cardiology

## 2015-02-05 DIAGNOSIS — Z950 Presence of cardiac pacemaker: Secondary | ICD-10-CM | POA: Diagnosis not present

## 2015-02-05 NOTE — Telephone Encounter (Signed)
Spoke with pt and reminded pt of remote transmission that is due today. Pt verbalized understanding.   

## 2015-02-05 NOTE — Progress Notes (Signed)
Remote pacemaker transmission.   

## 2015-02-07 LAB — CUP PACEART REMOTE DEVICE CHECK
Brady Statistic AP VS Percent: 69 %
Brady Statistic AS VP Percent: 0 %
Brady Statistic AS VS Percent: 31 %
Lead Channel Impedance Value: 585 Ohm
Lead Channel Pacing Threshold Amplitude: 0.75 V
Lead Channel Pacing Threshold Amplitude: 1.125 V
Lead Channel Pacing Threshold Pulse Width: 0.4 ms
Lead Channel Pacing Threshold Pulse Width: 0.4 ms
Lead Channel Setting Sensing Sensitivity: 1 mV
MDC IDC MSMT BATTERY IMPEDANCE: 4767 Ohm
MDC IDC MSMT BATTERY REMAINING LONGEVITY: 5 mo
MDC IDC MSMT BATTERY VOLTAGE: 2.65 V
MDC IDC MSMT LEADCHNL RA IMPEDANCE VALUE: 469 Ohm
MDC IDC MSMT LEADCHNL RA SENSING INTR AMPL: 2.8 mV
MDC IDC SESS DTM: 20161003152110
MDC IDC SET LEADCHNL RA PACING AMPLITUDE: 2 V
MDC IDC SET LEADCHNL RV PACING AMPLITUDE: 2.5 V
MDC IDC SET LEADCHNL RV PACING PULSEWIDTH: 0.4 ms
MDC IDC STAT BRADY AP VP PERCENT: 0 %

## 2015-03-08 ENCOUNTER — Telehealth: Payer: Self-pay | Admitting: Cardiology

## 2015-03-08 ENCOUNTER — Ambulatory Visit (INDEPENDENT_AMBULATORY_CARE_PROVIDER_SITE_OTHER): Payer: Medicare Other | Admitting: *Deleted

## 2015-03-08 DIAGNOSIS — Z950 Presence of cardiac pacemaker: Secondary | ICD-10-CM

## 2015-03-08 NOTE — Telephone Encounter (Signed)
Spoke with pt and reminded pt of remote transmission that is due today. Pt verbalized understanding.   

## 2015-03-09 NOTE — Progress Notes (Signed)
Remote pacemaker transmission.   

## 2015-03-14 ENCOUNTER — Encounter: Payer: Self-pay | Admitting: Cardiology

## 2015-03-20 ENCOUNTER — Encounter: Payer: Self-pay | Admitting: Cardiology

## 2015-03-20 ENCOUNTER — Encounter: Payer: Self-pay | Admitting: Internal Medicine

## 2015-03-20 LAB — CUP PACEART REMOTE DEVICE CHECK
Battery Impedance: 5223 Ohm
Battery Voltage: 2.64 V
Brady Statistic AP VP Percent: 0 %
Brady Statistic AP VS Percent: 70 %
Brady Statistic AS VS Percent: 30 %
Date Time Interrogation Session: 20161103170120
Implantable Lead Implant Date: 20070820
Implantable Lead Location: 753859
Implantable Lead Location: 753860
Implantable Lead Model: 4092
Lead Channel Setting Pacing Pulse Width: 0.4 ms
Lead Channel Setting Sensing Sensitivity: 1 mV
MDC IDC LEAD IMPLANT DT: 20070820
MDC IDC MSMT BATTERY REMAINING LONGEVITY: 3 mo
MDC IDC MSMT LEADCHNL RA IMPEDANCE VALUE: 490 Ohm
MDC IDC MSMT LEADCHNL RV IMPEDANCE VALUE: 622 Ohm
MDC IDC SET LEADCHNL RA PACING AMPLITUDE: 2 V
MDC IDC SET LEADCHNL RV PACING AMPLITUDE: 2.5 V
MDC IDC STAT BRADY AS VP PERCENT: 0 %

## 2015-04-03 ENCOUNTER — Encounter: Payer: Self-pay | Admitting: Cardiology

## 2015-04-09 ENCOUNTER — Ambulatory Visit (INDEPENDENT_AMBULATORY_CARE_PROVIDER_SITE_OTHER): Payer: Medicare Other | Admitting: *Deleted

## 2015-04-09 ENCOUNTER — Telehealth: Payer: Self-pay | Admitting: Cardiology

## 2015-04-09 DIAGNOSIS — Z950 Presence of cardiac pacemaker: Secondary | ICD-10-CM

## 2015-04-09 NOTE — Telephone Encounter (Signed)
Spoke with pt and reminded pt of remote transmission that is due today. Pt verbalized understanding.   

## 2015-04-09 NOTE — Progress Notes (Signed)
Remote pacemaker transmission.   

## 2015-04-18 LAB — CUP PACEART REMOTE DEVICE CHECK
Battery Remaining Longevity: 0 mo
Brady Statistic AP VS Percent: 68 %
Brady Statistic AS VP Percent: 0 %
Brady Statistic AS VS Percent: 32 %
Date Time Interrogation Session: 20161205172422
Implantable Lead Implant Date: 20070820
Implantable Lead Location: 753860
Lead Channel Impedance Value: 513 Ohm
Lead Channel Setting Pacing Amplitude: 2 V
Lead Channel Setting Pacing Amplitude: 2.5 V
Lead Channel Setting Pacing Pulse Width: 0.4 ms
MDC IDC LEAD IMPLANT DT: 20070820
MDC IDC LEAD LOCATION: 753859
MDC IDC MSMT BATTERY IMPEDANCE: 5945 Ohm
MDC IDC MSMT BATTERY VOLTAGE: 2.61 V
MDC IDC MSMT LEADCHNL RV IMPEDANCE VALUE: 597 Ohm
MDC IDC SET LEADCHNL RV SENSING SENSITIVITY: 1 mV
MDC IDC STAT BRADY AP VP PERCENT: 0 %

## 2015-04-25 ENCOUNTER — Encounter: Payer: Self-pay | Admitting: Cardiology

## 2015-05-10 ENCOUNTER — Telehealth: Payer: Self-pay | Admitting: Cardiology

## 2015-05-10 ENCOUNTER — Ambulatory Visit (INDEPENDENT_AMBULATORY_CARE_PROVIDER_SITE_OTHER): Payer: Medicare Other | Admitting: *Deleted

## 2015-05-10 ENCOUNTER — Encounter: Payer: Self-pay | Admitting: Cardiology

## 2015-05-10 DIAGNOSIS — Z45010 Encounter for checking and testing of cardiac pacemaker pulse generator [battery]: Secondary | ICD-10-CM | POA: Diagnosis not present

## 2015-05-10 DIAGNOSIS — Z950 Presence of cardiac pacemaker: Secondary | ICD-10-CM | POA: Diagnosis not present

## 2015-05-10 NOTE — Telephone Encounter (Signed)
Attempted to confirm remote transmission with pt. No answer and was unable to leave a message.   

## 2015-05-11 ENCOUNTER — Telehealth: Payer: Self-pay | Admitting: Cardiology

## 2015-05-11 ENCOUNTER — Encounter: Payer: Self-pay | Admitting: Cardiology

## 2015-05-11 ENCOUNTER — Telehealth: Payer: Self-pay | Admitting: Internal Medicine

## 2015-05-11 NOTE — Telephone Encounter (Signed)
Informed pt that we did received his remote transmission pt verbalized understanding.

## 2015-05-11 NOTE — Telephone Encounter (Signed)
Spoke w/ pt and informed him that his device has reached ERI. A scheduler will be calling to schedule an appointment with a provider so he can come in and talk about the procedure. Pt verbalized understanding.

## 2015-05-11 NOTE — Telephone Encounter (Signed)
°  1. Has your device fired? no ° °2. Is you device beeping? no ° °3. Are you experiencing draining or swelling at device site? no ° °4. Are you calling to see if we received your device transmission? yes ° °5. Have you passed out? no ° °

## 2015-05-11 NOTE — Progress Notes (Signed)
Remote pacemaker transmission.   

## 2015-05-24 LAB — CUP PACEART REMOTE DEVICE CHECK
Battery Remaining Longevity: 1 mo
Brady Statistic RV Percent Paced: 0 %
Date Time Interrogation Session: 20170106150255
Implantable Lead Implant Date: 20070820
Implantable Lead Model: 5076
Lead Channel Impedance Value: 67 Ohm
Lead Channel Setting Pacing Amplitude: 2.5 V
MDC IDC LEAD IMPLANT DT: 20070820
MDC IDC LEAD LOCATION: 753859
MDC IDC LEAD LOCATION: 753860
MDC IDC MSMT BATTERY IMPEDANCE: 7159 Ohm
MDC IDC MSMT BATTERY VOLTAGE: 2.61 V
MDC IDC MSMT LEADCHNL RV IMPEDANCE VALUE: 596 Ohm
MDC IDC SET LEADCHNL RV PACING PULSEWIDTH: 0.4 ms
MDC IDC SET LEADCHNL RV SENSING SENSITIVITY: 1 mV

## 2015-06-07 ENCOUNTER — Encounter: Payer: Self-pay | Admitting: Internal Medicine

## 2015-06-07 ENCOUNTER — Encounter: Payer: Self-pay | Admitting: Nurse Practitioner

## 2015-06-07 ENCOUNTER — Ambulatory Visit (INDEPENDENT_AMBULATORY_CARE_PROVIDER_SITE_OTHER): Payer: Medicare Other | Admitting: Nurse Practitioner

## 2015-06-07 ENCOUNTER — Encounter: Payer: Self-pay | Admitting: *Deleted

## 2015-06-07 VITALS — BP 114/72 | HR 65 | Ht 72.0 in | Wt 218.2 lb

## 2015-06-07 DIAGNOSIS — I10 Essential (primary) hypertension: Secondary | ICD-10-CM

## 2015-06-07 DIAGNOSIS — R001 Bradycardia, unspecified: Secondary | ICD-10-CM | POA: Diagnosis not present

## 2015-06-07 LAB — BASIC METABOLIC PANEL
BUN: 14 mg/dL (ref 7–25)
CALCIUM: 9.1 mg/dL (ref 8.6–10.3)
CO2: 27 mmol/L (ref 20–31)
CREATININE: 1.23 mg/dL — AB (ref 0.70–1.18)
Chloride: 99 mmol/L (ref 98–110)
GLUCOSE: 100 mg/dL — AB (ref 65–99)
POTASSIUM: 4.1 mmol/L (ref 3.5–5.3)
Sodium: 136 mmol/L (ref 135–146)

## 2015-06-07 LAB — CUP PACEART INCLINIC DEVICE CHECK
Date Time Interrogation Session: 20170202131114
Implantable Lead Implant Date: 20070820
Implantable Lead Location: 753860
Implantable Lead Model: 4092
MDC IDC LEAD IMPLANT DT: 20070820
MDC IDC LEAD LOCATION: 753859

## 2015-06-07 LAB — CBC
HEMATOCRIT: 42.8 % (ref 39.0–52.0)
Hemoglobin: 14.5 g/dL (ref 13.0–17.0)
MCH: 30.9 pg (ref 26.0–34.0)
MCHC: 33.9 g/dL (ref 30.0–36.0)
MCV: 91.1 fL (ref 78.0–100.0)
MPV: 10 fL (ref 8.6–12.4)
Platelets: 271 10*3/uL (ref 150–400)
RBC: 4.7 MIL/uL (ref 4.22–5.81)
RDW: 13.6 % (ref 11.5–15.5)
WBC: 10.3 10*3/uL (ref 4.0–10.5)

## 2015-06-07 NOTE — Patient Instructions (Addendum)
Medication Instructions:   Your physician recommends that you continue on your current medications as directed. Please refer to the Current Medication list given to you today.   If you need a refill on your cardiac medications before your next appointment, please call your pharmacy.  Labwork: CBC BMET AND PT/ INR     Testing/Procedures:  SEE LETTER FOR GEN CHANGE OUT 06/18/15   Follow-Up: 10 DAY WOUND CHECK AFTER  06/18/15                       91 DAYS PHYS PACER CHK WITH DR Graciela Husbands  AFTER 06/18/15   Any Other Special Instructions Will Be Listed Below (If Applicable).

## 2015-06-07 NOTE — Progress Notes (Signed)
Electrophysiology Office Note Date: 06/07/2015  ID:  [name] [name] 18-May-1945, MRN [id number]  PCP: [name] Blamer, MD Electrophysiologist: [name] [name]   CC: Pacemaker at [facility]   [name] [name] is a 70 y.o. male seen today for Dr [name] [name].  His pacemaker has been found to be at [facility] and she presents today to discuss generator change.   Since last being seen in our clinic, the patient reports doing relatively well.  He has had some increased shortness of breath and exercise intolerance since reaching ERI. He has been through several hip procedures recently.  He denies chest pain, palpitations, dyspnea, PND, orthopnea, nausea, vomiting, dizziness, syncope, edema, weight gain, or early satiety.  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 Hypertension   . Headache(784.0)   . Bradycardia     pacemaker - Medtronic Adapta #ADDRO1 - December 22, 2005  . Hyperlipidemia   . Depression   . Insomnia   . Chronic back pain   . COPD (chronic obstructive pulmonary disease) (HCC)   . Diabetes mellitus     type II controled with diet  . GERD (gastroesophageal reflux disease)   . Arthritis   . Neuromuscular disorder (HCC)     peripheral neuropathy BLE   Past Surgical History  Procedure Laterality Date  . Back surgery    . Hemorrhoid surgery    . Pacemaker insertion  ?2007  . Insert / replace / remove pacemaker    . Total hip arthroplasty Right 01/25/2013    Procedure: TOTAL HIP ARTHROPLASTY ANTERIOR APPROACH;  Surgeon: Velna Ochs, MD;  Location: [facility] OR;  Service: Orthopedics;  Laterality: Right;    Current Outpatient Prescriptions  Medication Sig Dispense Refill  . acetaminophen (TYLENOL) 650 MG CR tablet Take 650 mg by mouth every 8 (eight) hours as needed for pain.    Ailene Ards ELLIPTA 62.5-25 MCG/INH AEPB Inhale 1 puff into the lungs daily.     [name] aspirin 81 MG tablet Take 81 mg by mouth daily.    [name]  atorvastatin (LIPITOR) 40 MG tablet Take 40 mg by mouth daily.    [name] BENICAR 40 MG tablet Take 1 tablet by mouth daily.    [name] docusate sodium (COLACE) 100 MG capsule Take 100 mg by mouth daily.    . famotidine (PEPCID) 20 MG tablet TAKE ONE TABLET BY MOUTH ONCE DAILY AT BEDTIME 30 tablet 0  . fluticasone (FLOVENT HFA) 110 MCG/ACT inhaler 2 puffs then rinse mouth, twice daily 1 Inhaler 12  . gabapentin (NEURONTIN) 300 MG capsule Take 300 mg by mouth 2 (two) times daily.    [name] glimepiride (AMARYL) 1 MG tablet Take 1 tablet by mouth daily.    [name] ipratropium-albuterol (DUONEB) 0.5-2.5 (3) MG/3ML SOLN Inhale 3 mLs into the lungs every 6 (six) hours as needed (wheezing or shortness of breath).     [name] levocetirizine (XYZAL) 5 MG tablet Take 1 tablet by mouth daily.    . montelukast (SINGULAIR) 10 MG tablet Take 1 tablet by mouth daily.    . Multiple Vitamin (MULTIVITAMIN WITH MINERALS) TABS tablet Take 1 tablet by mouth daily.    . pantoprazole (PROTONIX) 40 MG tablet TAKE ONE TABLET BY MOUTH ONCE DAILY TAKE  30-60  MINUTES  BEFORE  FIRST  MEAL  OF  THE  DAY 30 tablet 0  . PROAIR HFA 108 (90 BASE) MCG/ACT inhaler Inhale 2 puffs into  the lungs 4 (four) times daily as needed.    . [DISCONTINUED] cloNIDine (CATAPRES) 0.1 MG tablet Take 1 tablet (0.1 mg total) by mouth 2 (two) times daily. 60 tablet 11   No current facility-administered medications for this visit.    Allergies:   Penicillins; Metoprolol tartrate; and Codeine   Social History: Social History   Social History  . Marital Status: Legally Separated    Spouse Name: N/A  . Number of Children: 2  . Years of Education: `   Occupational History  . Retired    Social History Main Topics  . Smoking status: Former Smoker -- 2.00 packs/day for 15 years    Types: Cigarettes    Start date: 05/05/1958    Quit date: 05/05/1973  . Smokeless tobacco: Never Used  . Alcohol Use: 0.0 oz/week    0 Standard drinks or equivalent per week     Comment:  beer occ  . Drug Use: No  . Sexual Activity: Not on file   Other Topics Concern  . Not on file   Social History Narrative    Family History: Family History  Problem Relation Age of Onset  . Heart disease Mother   . Asthma Maternal Aunt   . Allergies      whole family     Review of Systems: All other systems reviewed and are otherwise negative except as noted above.   Physical Exam: VS:  BP 114/72 mmHg  Pulse 65  Ht 6' (1.829 m)  Wt 218 lb 3.2 oz (98.975 kg)  BMI 29.59 kg/m2  SpO2 98% , BMI Body mass index is 29.59 kg/(m^2).  GEN- The patient is elderly appearing, alert and oriented x 3 today.   HEENT: normocephalic, atraumatic; sclera clear, conjunctiva pink; hearing intact; oropharynx clear; neck supple Lungs- Clear to ausculation bilaterally, normal work of breathing.  No wheezes, rales, rhonchi Heart- Regular rate and rhythm, no murmurs, rubs or gallops GI- soft, non-tender, non-distended, bowel sounds present Extremities- no clubbing, cyanosis, or edema; DP/PT/radial pulses 2+ bilaterally MS- no significant deformity or atrophy Skin- warm and dry, no rash or lesion; PPM pocket well healed Psych- euthymic mood, full affect Neuro- strength and sensation are intact  PPM Interrogation- reviewed in detail today,  See PACEART report  EKG:  EKG is not ordered today.  Recent Labs: 08/28/2014: BUN 10; Creatinine, Ser 1.08; Hemoglobin 14.3; Platelets 237.0; Potassium 4.1; Pro B Natriuretic peptide (BNP) 61.0; Sodium 132*   Wt Readings from Last 3 Encounters:  06/07/15 218 lb 3.2 oz (98.975 kg)  01/25/15 214 lb 15.2 oz (97.5 kg)  12/15/14 215 lb (97.523 kg)     Other studies Reviewed: Additional studies/ records that were reviewed today include: Dr Odessa Fleming office notes  Assessment and Plan:  1.  Symptomatic bradycardia Pacemaker at ERI.  Risks, benefits, alternatives to pacemaker generator change discussed with the patient today who would like to proceed. Will  schedule with Dr [name] [name] at the next available time.   2.  HTN Stable No change required today   Current medicines are reviewed at length with the patient today.   The patient does not have concerns regarding his medicines.  The following changes were made today:  none  Labs/ tests ordered today include: pre-procedure labs    Disposition:   Follow up with Dr [name] [name] following generator change    Signed, Gypsy Balsam, NP 06/07/2015 1:09 PM  [facility] [address] [place] [address] [phone] (office) [phone] (  fax)

## 2015-06-08 ENCOUNTER — Telehealth: Payer: Self-pay | Admitting: *Deleted

## 2015-06-08 LAB — PROTIME-INR
INR: 1.01 (ref <1.50)
Prothrombin Time: 13.4 seconds (ref 11.6–15.2)

## 2015-06-08 NOTE — Telephone Encounter (Signed)
-----   Message from Marily Lente, NP sent at 06/08/2015  6:57 AM EST ----- Please notify patient of stable pre-procedure labs.

## 2015-06-14 ENCOUNTER — Telehealth: Payer: Self-pay | Admitting: Internal Medicine

## 2015-06-14 NOTE — Telephone Encounter (Signed)
Per CY-lets have patient come in tomorrow to see TP or SG as they both have openings. Thanks.

## 2015-06-14 NOTE — Telephone Encounter (Signed)
New message  Pt called states that he has an ablation scheduled. Has a cold right now. Request a call back to determine if he could still have the cath with Cold symptoms. Please call

## 2015-06-14 NOTE — Telephone Encounter (Signed)
Called and spoke to pt. Pt states he is having a pacemaker placed by Dr. Sherryl Manges on 2/22, this was once scheduled for 2/13 but Dr. Graciela Husbands felt pt needed to be seen by Dr. Maple Hudson, per pt. Pt c/o cough with little mucus production - yellow in color, increase in SOB, chest tightness at times, and wheezing x 3-4 days. Pt denies f/c/s. Pt is requesting an appt.   Dr. Maple Hudson please advise. Thanks.   Allergies  Allergen Reactions  . Penicillins Anaphylaxis  . Metoprolol Tartrate Other (See Comments)    HR drops too low  . Codeine Rash    Current Outpatient Prescriptions on File Prior to Visit  Medication Sig Dispense Refill  . acetaminophen (TYLENOL) 650 MG CR tablet Take 650 mg by mouth every 8 (eight) hours as needed for pain.    Ailene Ards ELLIPTA 62.5-25 MCG/INH AEPB Inhale 1 puff into the lungs daily.     [name] aspirin 81 MG tablet Take 81 mg by mouth daily.    [name] atorvastatin (LIPITOR) 40 MG tablet Take 40 mg by mouth daily.    [name] BENICAR 40 MG tablet Take 1 tablet by mouth daily.    [name] docusate sodium (COLACE) 100 MG capsule Take 100 mg by mouth daily.    . famotidine (PEPCID) 20 MG tablet TAKE ONE TABLET BY MOUTH ONCE DAILY AT BEDTIME 30 tablet 0  . fluticasone (FLOVENT HFA) 110 MCG/ACT inhaler 2 puffs then rinse mouth, twice daily 1 Inhaler 12  . gabapentin (NEURONTIN) 300 MG capsule Take 300 mg by mouth 2 (two) times daily.    [name] glimepiride (AMARYL) 1 MG tablet Take 1 tablet by mouth daily.    [name] ipratropium-albuterol (DUONEB) 0.5-2.5 (3) MG/3ML SOLN Inhale 3 mLs into the lungs every 6 (six) hours as needed (wheezing or shortness of breath).     [name] levocetirizine (XYZAL) 5 MG tablet Take 1 tablet by mouth daily.    . montelukast (SINGULAIR) 10 MG tablet Take 1 tablet by mouth daily.    . Multiple Vitamin (MULTIVITAMIN WITH MINERALS) TABS tablet Take 1 tablet by mouth daily.    . pantoprazole (PROTONIX) 40 MG tablet TAKE ONE TABLET BY MOUTH ONCE DAILY TAKE  30-60  MINUTES  BEFORE  FIRST  MEAL  OF   THE  DAY 30 tablet 0  . PROAIR HFA 108 (90 BASE) MCG/ACT inhaler Inhale 2 puffs into the lungs 4 (four) times daily as needed.    . [DISCONTINUED] cloNIDine (CATAPRES) 0.1 MG tablet Take 1 tablet (0.1 mg total) by mouth 2 (two) times daily. 60 tablet 11   No current facility-administered medications on file prior to visit.

## 2015-06-14 NOTE — Telephone Encounter (Signed)
Attempted to call the patient back. No answer and no voice mail set up. Will call back.

## 2015-06-14 NOTE — Telephone Encounter (Signed)
I called and spoke with the patient.  He has been dealing with a cough/ chest congestion for the last 2-3 days. He states he is coughing up some yellowish sputum. He is using his inhalers to try to help clear this up, but not noticed a difference. He denies fever. I advised him with his productive cough, that we should reschedule him to a little bit later for his generator change out. I have rescheduled him to 06/27/15 at 2:30 pm. He is going to call Dr. Maple Hudson to try to follow up with him prior to his change out.

## 2015-06-14 NOTE — Telephone Encounter (Signed)
Called spoke with pt. appt scheduled to see TP tomorrow at 9:45

## 2015-06-15 ENCOUNTER — Encounter: Payer: Self-pay | Admitting: Adult Health

## 2015-06-15 ENCOUNTER — Ambulatory Visit (INDEPENDENT_AMBULATORY_CARE_PROVIDER_SITE_OTHER)
Admission: RE | Admit: 2015-06-15 | Discharge: 2015-06-15 | Disposition: A | Discharge status: Home / Self Care | Payer: Medicare Other | Source: Ambulatory Visit | Attending: Adult Health | Admitting: Adult Health

## 2015-06-15 ENCOUNTER — Ambulatory Visit (INDEPENDENT_AMBULATORY_CARE_PROVIDER_SITE_OTHER): Payer: Medicare Other | Admitting: Adult Health

## 2015-06-15 VITALS — BP 138/82 | HR 61 | Temp 97.8°F | Ht 72.0 in | Wt 216.0 lb

## 2015-06-15 DIAGNOSIS — J441 Chronic obstructive pulmonary disease with (acute) exacerbation: Secondary | ICD-10-CM | POA: Diagnosis not present

## 2015-06-15 MED ORDER — PREDNISONE 10 MG PO TABS: ORAL_TABLET | ORAL | Status: DC

## 2015-06-15 MED ORDER — DOXYCYCLINE HYCLATE 100 MG PO TABS: 100.0000 mg | ORAL_TABLET | Freq: Two times a day (BID) | ORAL | Status: DC

## 2015-06-15 NOTE — Assessment & Plan Note (Addendum)
Flare  Check cxr   Plan  Doxcycline  Twice daily  For 7 days  Prednisone taper over next week .  Mucinex DM Twice daily  As needed  Cough/congestion  Chest xray today .  Fluids and rest  Please contact office for sooner follow up if symptoms do not improve or worsen or seek emergency care  Follow up Dr. Maple Hudson  As planned and As needed

## 2015-06-15 NOTE — Progress Notes (Signed)
Quick Note:  Called and spoke with pt. Reviewed results and recs. Pt voiced understanding and had no further questions. ______ 

## 2015-06-15 NOTE — Progress Notes (Signed)
Subjective:    Patient ID: [name] [name], male    DOB: [birth year], 70 y.o.   MRN: [id number]  HPI  70 yo male with COPD , chronic cough   Office Spirometry 08/11/2014-moderate restriction of exhaled volume. FVC 2.98/62%, FEV1 2.37/65%, FEV1/FVC 0.79, FEF 25-75 percent 2.43/77%.  06/15/2015 Acute OV :  Pt presents for an acute office visit.  CY pt here c/o prod cough with a small amount of yellow mucus, chest congestion/tightness, PND, SOB and wheezing at times starting on 05/08/15. Denies any sinus congestion, fever, nausea or vomiting. Pacemaker placement rescheduled for 06/27/15. Remains on Flovent Twice daily  And ANORO. Has been using mucinex dm without much help.     Past Medical History  Diagnosis Date  . Hypertension   . Headache(784.0)   . Bradycardia     pacemaker - Medtronic Adapta #ADDRO1 - December 22, 2005  . Hyperlipidemia   . Depression   . Insomnia   . Chronic back pain   . COPD (chronic obstructive pulmonary disease) (HCC)   . Diabetes mellitus     type II controled with diet  . GERD (gastroesophageal reflux disease)   . Arthritis   . Neuromuscular disorder (HCC)     peripheral neuropathy BLE   Current Outpatient Prescriptions on File Prior to Visit  Medication Sig Dispense Refill  . acetaminophen (TYLENOL) 650 MG CR tablet Take 650 mg by mouth every 8 (eight) hours as needed for pain.    Ailene Ards ELLIPTA 62.5-25 MCG/INH AEPB Inhale 1 puff into the lungs daily.     [name] aspirin 81 MG tablet Take 81 mg by mouth daily.    [name] atorvastatin (LIPITOR) 40 MG tablet Take 40 mg by mouth daily.    [name] BENICAR 40 MG tablet Take 1 tablet by mouth daily.    [name] docusate sodium (COLACE) 100 MG capsule Take 100 mg by mouth daily.    . famotidine (PEPCID) 20 MG tablet TAKE ONE TABLET BY MOUTH ONCE DAILY AT BEDTIME 30 tablet 0  . fluticasone (FLOVENT HFA) 110 MCG/ACT inhaler 2 puffs then rinse mouth, twice daily 1 Inhaler 12  . gabapentin (NEURONTIN) 300 MG capsule Take 300 mg by  mouth 2 (two) times daily.    [name] glimepiride (AMARYL) 1 MG tablet Take 1 tablet by mouth daily.    [name] ipratropium-albuterol (DUONEB) 0.5-2.5 (3) MG/3ML SOLN Inhale 3 mLs into the lungs every 6 (six) hours as needed (wheezing or shortness of breath).     [name] levocetirizine (XYZAL) 5 MG tablet Take 1 tablet by mouth daily.    . montelukast (SINGULAIR) 10 MG tablet Take 1 tablet by mouth daily.    . Multiple Vitamin (MULTIVITAMIN WITH MINERALS) TABS tablet Take 1 tablet by mouth daily.    . pantoprazole (PROTONIX) 40 MG tablet TAKE ONE TABLET BY MOUTH ONCE DAILY TAKE  30-60  MINUTES  BEFORE  FIRST  MEAL  OF  THE  DAY 30 tablet 0  . PROAIR HFA 108 (90 BASE) MCG/ACT inhaler Inhale 2 puffs into the lungs 4 (four) times daily as needed.    . [DISCONTINUED] cloNIDine (CATAPRES) 0.1 MG tablet Take 1 tablet (0.1 mg total) by mouth 2 (two) times daily. 60 tablet 11   No current facility-administered medications on file prior to visit.     Review of Systems Constitutional:   No  weight loss, night sweats,  Fevers, chills,  +fatigue, or  lassitude.  HEENT:   No headaches,  Difficulty swallowing,  Tooth/dental problems, or  Sore throat,                No sneezing, itching, ear ache,  +nasal congestion, post nasal drip,   CV:  No chest pain,  Orthopnea, PND, swelling in lower extremities, anasarca, dizziness, palpitations, syncope.   GI  No heartburn, indigestion, abdominal pain, nausea, vomiting, diarrhea, change in bowel habits, loss of appetite, bloody stools.   Resp: .  No chest wall deformity  Skin: no rash or lesions.  GU: no dysuria, change in color of urine, no urgency or frequency.  No flank pain, no hematuria   MS:  No joint pain or swelling.  No decreased range of motion.  No back pain.  Psych:  No change in mood or affect. No depression or anxiety.  No memory loss.         Objective:   Physical Exam  Filed Vitals:   06/15/15 0948  BP: 138/82  Pulse: 61  Temp: 97.8 F (36.6  C)  TempSrc: Oral  Height: 6' (1.829 m)  Weight: 216 lb (97.977 kg)  SpO2: 98%   GEN: A/Ox3; pleasant , NAD   HEENT:  [place]/AT,  EACs-clear, TMs-wnl, NOSE-clear drainage  THROAT-clear, no lesions, no postnasal drip or exudate noted. Cold sore along lip   NECK:  Supple w/ fair ROM; no JVD; normal carotid impulses w/o bruits; no thyromegaly or nodules palpated; no lymphadenopathy.  RESP  Few trace exp wheezes , no accessory muscle use, no dullness to percussion Talking in full sentences. No stridor   CARD:  RRR, no m/r/g  , no peripheral edema, pulses intact, no cyanosis or clubbing.  GI:   Soft & nt; nml bowel sounds; no organomegaly or masses detected.  Musco: Warm bil, no deformities or joint swelling noted.   Neuro: alert, no focal deficits noted.    Skin: Warm, no lesions or rashes        Assessment & Plan:

## 2015-06-15 NOTE — Addendum Note (Signed)
Addended by: Karalee Height on: 06/15/2015 10:24 AM   Modules accepted: Orders

## 2015-06-15 NOTE — Patient Instructions (Addendum)
Doxcycline  Twice daily  For 7 days  Prednisone taper over next week .  Mucinex DM Twice daily  As needed  Cough/congestion  Chest xray today .  Fluids and rest  Please contact office for sooner follow up if symptoms do not improve or worsen or seek emergency care  Follow up Dr. Maple Hudson  As planned and As needed

## 2015-06-18 ENCOUNTER — Ambulatory Visit: Payer: Medicare Other | Admitting: Internal Medicine

## 2015-06-27 ENCOUNTER — Ambulatory Visit (HOSPITAL_COMMUNITY)
Admission: RE | Admit: 2015-06-27 | Discharge: 2015-06-27 | Disposition: A | Discharge status: Home / Self Care | Payer: Medicare Other | Source: Ambulatory Visit | Attending: Internal Medicine | Admitting: Internal Medicine

## 2015-06-27 ENCOUNTER — Encounter (HOSPITAL_COMMUNITY)
Admission: RE | Disposition: A | Discharge status: Home / Self Care | Payer: Self-pay | Source: Ambulatory Visit | Attending: Internal Medicine

## 2015-06-27 DIAGNOSIS — Z87891 Personal history of nicotine dependence: Secondary | ICD-10-CM | POA: Diagnosis not present

## 2015-06-27 DIAGNOSIS — I10 Essential (primary) hypertension: Secondary | ICD-10-CM | POA: Insufficient documentation

## 2015-06-27 DIAGNOSIS — T82190A Other mechanical complication of cardiac electrode, initial encounter: Secondary | ICD-10-CM | POA: Diagnosis not present

## 2015-06-27 DIAGNOSIS — G8929 Other chronic pain: Secondary | ICD-10-CM | POA: Insufficient documentation

## 2015-06-27 DIAGNOSIS — M1990 Unspecified osteoarthritis, unspecified site: Secondary | ICD-10-CM | POA: Insufficient documentation

## 2015-06-27 DIAGNOSIS — E785 Hyperlipidemia, unspecified: Secondary | ICD-10-CM | POA: Insufficient documentation

## 2015-06-27 DIAGNOSIS — Z7982 Long term (current) use of aspirin: Secondary | ICD-10-CM | POA: Diagnosis not present

## 2015-06-27 DIAGNOSIS — K219 Gastro-esophageal reflux disease without esophagitis: Secondary | ICD-10-CM | POA: Insufficient documentation

## 2015-06-27 DIAGNOSIS — Z45010 Encounter for checking and testing of cardiac pacemaker pulse generator [battery]: Secondary | ICD-10-CM | POA: Diagnosis not present

## 2015-06-27 DIAGNOSIS — E1142 Type 2 diabetes mellitus with diabetic polyneuropathy: Secondary | ICD-10-CM | POA: Insufficient documentation

## 2015-06-27 DIAGNOSIS — F329 Major depressive disorder, single episode, unspecified: Secondary | ICD-10-CM | POA: Diagnosis not present

## 2015-06-27 DIAGNOSIS — J449 Chronic obstructive pulmonary disease, unspecified: Secondary | ICD-10-CM | POA: Diagnosis not present

## 2015-06-27 DIAGNOSIS — I495 Sick sinus syndrome: Secondary | ICD-10-CM | POA: Diagnosis not present

## 2015-06-27 DIAGNOSIS — Z880 Allergy status to penicillin: Secondary | ICD-10-CM | POA: Diagnosis not present

## 2015-06-27 DIAGNOSIS — M549 Dorsalgia, unspecified: Secondary | ICD-10-CM | POA: Insufficient documentation

## 2015-06-27 DIAGNOSIS — Z950 Presence of cardiac pacemaker: Secondary | ICD-10-CM | POA: Diagnosis present

## 2015-06-27 HISTORY — PX: EP IMPLANTABLE DEVICE: SHX172B

## 2015-06-27 LAB — CBC
HEMATOCRIT: 41.2 % (ref 39.0–52.0)
Hemoglobin: 14 g/dL (ref 13.0–17.0)
MCH: 31.6 pg (ref 26.0–34.0)
MCHC: 34 g/dL (ref 30.0–36.0)
MCV: 93 fL (ref 78.0–100.0)
Platelets: 200 10*3/uL (ref 150–400)
RBC: 4.43 MIL/uL (ref 4.22–5.81)
RDW: 13.5 % (ref 11.5–15.5)
WBC: 8.8 10*3/uL (ref 4.0–10.5)

## 2015-06-27 LAB — SURGICAL PCR SCREEN
MRSA, PCR: NEGATIVE
STAPHYLOCOCCUS AUREUS: NEGATIVE

## 2015-06-27 LAB — BASIC METABOLIC PANEL
Anion gap: 8 (ref 5–15)
BUN: 10 mg/dL (ref 6–20)
CO2: 27 mmol/L (ref 22–32)
Calcium: 8.8 mg/dL — ABNORMAL LOW (ref 8.9–10.3)
Chloride: 104 mmol/L (ref 101–111)
Creatinine, Ser: 1.21 mg/dL (ref 0.61–1.24)
GFR calc Af Amer: >60 mL/min (ref >60)
GFR, EST NON AFRICAN AMERICAN: 58 mL/min — AB (ref >60)
GLUCOSE: 122 mg/dL — AB (ref 65–99)
POTASSIUM: 4.1 mmol/L (ref 3.5–5.1)
Sodium: 139 mmol/L (ref 135–145)

## 2015-06-27 LAB — GLUCOSE, CAPILLARY
Glucose-Capillary: 99 mg/dL (ref 65–99)
Glucose-Capillary: 94 mg/dL (ref 65–99)

## 2015-06-27 SURGERY — PPM/BIV PPM GENERATOR CHANGEOUT

## 2015-06-27 MED ORDER — MIDAZOLAM HCL 5 MG/5ML IJ SOLN
INTRAMUSCULAR | Status: AC
  Filled 2015-06-27: qty 5

## 2015-06-27 MED ORDER — LIDOCAINE HCL (PF) 1 % IJ SOLN
INTRAMUSCULAR | Status: AC
  Filled 2015-06-27: qty 30

## 2015-06-27 MED ORDER — LIDOCAINE HCL (PF) 1 % IJ SOLN
INTRAMUSCULAR | Status: DC | PRN
  Administered 2015-06-27: 50 mL

## 2015-06-27 MED ORDER — SODIUM CHLORIDE 0.9 % IV SOLN: INTRAVENOUS | Status: DC

## 2015-06-27 MED ORDER — FENTANYL CITRATE (PF) 100 MCG/2ML IJ SOLN
INTRAMUSCULAR | Status: AC
  Filled 2015-06-27: qty 2

## 2015-06-27 MED ORDER — SODIUM CHLORIDE 0.9 % IR SOLN
Status: AC
  Filled 2015-06-27: qty 2

## 2015-06-27 MED ORDER — ONDANSETRON HCL 4 MG/2ML IJ SOLN: 4.0000 mg | Freq: Four times a day (QID) | INTRAMUSCULAR | Status: DC | PRN

## 2015-06-27 MED ORDER — MIDAZOLAM HCL 5 MG/5ML IJ SOLN
INTRAMUSCULAR | Status: DC | PRN
  Administered 2015-06-27: 2 mg via INTRAVENOUS

## 2015-06-27 MED ORDER — SODIUM CHLORIDE 0.9 % IV SOLN
INTRAVENOUS | Status: DC
  Administered 2015-06-27: 12:00:00 via INTRAVENOUS

## 2015-06-27 MED ORDER — CHLORHEXIDINE GLUCONATE 4 % EX LIQD: 60.0000 mL | Freq: Once | CUTANEOUS | Status: DC

## 2015-06-27 MED ORDER — MUPIROCIN 2 % EX OINT
TOPICAL_OINTMENT | Freq: Once | CUTANEOUS | Status: AC
  Administered 2015-06-27: 1 via NASAL
  Filled 2015-06-27: qty 22

## 2015-06-27 MED ORDER — GENTAMICIN SULFATE 40 MG/ML IJ SOLN
80.0000 mg | INTRAMUSCULAR | Status: AC
  Administered 2015-06-27: 80 mg

## 2015-06-27 MED ORDER — FENTANYL CITRATE (PF) 100 MCG/2ML IJ SOLN
INTRAMUSCULAR | Status: DC | PRN
  Administered 2015-06-27: 25 ug via INTRAVENOUS

## 2015-06-27 MED ORDER — VANCOMYCIN HCL IN DEXTROSE 1-5 GM/200ML-% IV SOLN
1000.0000 mg | INTRAVENOUS | Status: AC
  Administered 2015-06-27: 1000 mg via INTRAVENOUS
  Filled 2015-06-27 (×2): qty 200

## 2015-06-27 MED ORDER — MUPIROCIN 2 % EX OINT
TOPICAL_OINTMENT | CUTANEOUS | Status: AC
Start: 2015-06-27 — End: 2015-06-27
  Administered 2015-06-27: 1 via NASAL
  Filled 2015-06-27: qty 22

## 2015-06-27 MED ORDER — ACETAMINOPHEN 325 MG PO TABS
325.0000 mg | ORAL_TABLET | ORAL | Status: DC | PRN
  Filled 2015-06-27: qty 2

## 2015-06-27 SURGICAL SUPPLY — 5 items
CABLE SURGICAL S-101-97-12 (CABLE) ×1 IMPLANT
PACEMAKER ADAPTA DR ADDRL1 (Pacemaker) IMPLANT
PAD DEFIB LIFELINK (PAD) ×1 IMPLANT
PPM ADAPTA DR ADDRL1 (Pacemaker) ×2 IMPLANT
TRAY PACEMAKER INSERTION (PACKS) ×1 IMPLANT

## 2015-06-27 NOTE — Progress Notes (Signed)
Cardiology Office Note Date:  06/27/2015  Patient ID:  [name], [name] 06-07-1945, MRN [id number] PCP:  [name], MD  Electrophysiologist: Dr. Graciela Husbands   **refresh   Chief Complaint: post gen change visit  History of Present Illness: [name] [name] is a 70 y.o. male with history of HTN, HLD, DM, COPD, and bradycardia with PPM, now s/p generator change.   He comes today for post procedure wound and device check.  He is feeling **   Past Medical History  Diagnosis Date  . Hypertension   . Headache(784.0)   . Bradycardia     pacemaker - Medtronic Adapta #ADDRO1 - December 22, 2005  . Hyperlipidemia   . Depression   . Insomnia   . Chronic back pain   . COPD (chronic obstructive pulmonary disease) (HCC)   . Diabetes mellitus     type II controled with diet  . GERD (gastroesophageal reflux disease)   . Arthritis   . Neuromuscular disorder (HCC)     peripheral neuropathy BLE    Past Surgical History  Procedure Laterality Date  . Back surgery    . Hemorrhoid surgery    . Pacemaker insertion  ?2007  . Insert / replace / remove pacemaker    . Total hip arthroplasty Right 01/25/2013    Procedure: TOTAL HIP ARTHROPLASTY ANTERIOR APPROACH;  Surgeon: Velna Ochs, MD;  Location: [facility] OR;  Service: Orthopedics;  Laterality: Right;    Current Outpatient Prescriptions  Medication Sig Dispense Refill  . acetaminophen (TYLENOL) 650 MG CR tablet Take 650 mg by mouth every 8 (eight) hours as needed for pain.    Ailene Ards ELLIPTA 62.5-25 MCG/INH AEPB Inhale 1 puff into the lungs daily.     [name] aspirin 81 MG tablet Take 81 mg by mouth daily.    [name] atorvastatin (LIPITOR) 40 MG tablet Take 40 mg by mouth daily.    [name] BENICAR 40 MG tablet Take 1 tablet by mouth daily.    [name] docusate sodium (COLACE) 100 MG capsule Take 100 mg by mouth daily.    . famotidine (PEPCID) 20 MG tablet TAKE ONE TABLET BY MOUTH ONCE DAILY AT BEDTIME 30 tablet 0  . fluticasone (FLOVENT HFA) 110 MCG/ACT inhaler 2  puffs then rinse mouth, twice daily 1 Inhaler 12  . gabapentin (NEURONTIN) 300 MG capsule Take 300 mg by mouth 2 (two) times daily.    [name] glimepiride (AMARYL) 1 MG tablet Take 1 tablet by mouth daily.    [name] ipratropium-albuterol (DUONEB) 0.5-2.5 (3) MG/3ML SOLN Inhale 3 mLs into the lungs every 6 (six) hours as needed (wheezing or shortness of breath).     [name] levocetirizine (XYZAL) 5 MG tablet Take 1 tablet by mouth daily.    . montelukast (SINGULAIR) 10 MG tablet Take 1 tablet by mouth at bedtime.     . Multiple Vitamin (MULTIVITAMIN WITH MINERALS) TABS tablet Take 1 tablet by mouth daily.    . pantoprazole (PROTONIX) 40 MG tablet TAKE ONE TABLET BY MOUTH ONCE DAILY TAKE  30-60  MINUTES  BEFORE  FIRST  MEAL  OF  THE  DAY 30 tablet 0  . PROAIR HFA 108 (90 BASE) MCG/ACT inhaler Inhale 2 puffs into the lungs 4 (four) times daily as needed.    . [DISCONTINUED] cloNIDine (CATAPRES) 0.1 MG tablet Take 1 tablet (0.1 mg total) by mouth 2 (two) times daily. 60 tablet 11   No current facility-administered medications for this visit.   Facility-Administered Medications Ordered  in Other Visits  Medication Dose Route Frequency Provider Last Rate Last Dose  . 0.9 %  sodium chloride infusion   Intravenous Continuous Marily Lente, NP 50 mL/hr at 06/27/15 1222    . acetaminophen (TYLENOL) tablet 325-650 mg  325-650 mg Oral Q4H PRN Duke Salvia, MD      . chlorhexidine (HIBICLENS) 4 % liquid 4 application  60 mL Topical Once Amber Caryl Bis, NP      . ondansetron [facility]) injection 4 mg  4 mg Intravenous Q6H PRN Duke Salvia, MD        Allergies:   Penicillins; Metoprolol tartrate; and Codeine   Social History:  The patient  reports that he quit smoking about 42 years ago. His smoking use included Cigarettes. He started smoking about 57 years ago. He has a 30 pack-year smoking history. He has never used smokeless tobacco. He reports that he drinks alcohol. He reports that he does not use illicit drugs.    Family History:  The patient's family history includes Asthma in his maternal aunt; Heart disease in his mother.  ROS:  Please see the history of present illness.  All other systems are reviewed and otherwise negative.   PHYSICAL EXAM: ** VS:  There were no vitals taken for this visit. BMI: There is no weight on file to calculate BMI. Well nourished, well developed, in no acute distress HEENT: normocephalic, atraumatic Neck: no JVD, carotid bruits or masses Cardiac:  normal S1, S2; RRR; no significant murmurs, no rubs, or gallops Lungs:  clear to auscultation bilaterally, no wheezing, rhonchi or rales Abd: soft, nontender,  + BS MS: no deformity or atrophy Ext: no edema Skin: warm and dry, no rash Neuro:  No gross deficits appreciated Psych: euthymic mood, full affect  ** PPM site is stable, **   EKG:  ** PPM interrogation today: **  08/29/15: Echocardiogram Study Conclusions - Left ventricle: The cavity size was normal. There was mild focal basal hypertrophy of the septum. Systolic function was normal. The estimated ejection fraction was in the range of 55% to 60%. Wall motion was normal; there were no regional wall motion abnormalities. Doppler parameters are consistent with abnormal left ventricular relaxation (grade 1 diastolic dysfunction). - Left atrium: The atrium was mildly dilated. - Pulmonary arteries: Systolic pressure was mildly increased. PA peak pressure: 34 mm Hg (S).  Impressions: - Normal LV function; grade 1 diastolic dysfunction; mild LAE; trace TR; mildly elevated pulmonary pressure.  Recent Labs: 08/28/2014: Pro B Natriuretic peptide (BNP) 61.0 06/27/2015: BUN 10; Creatinine, Ser 1.21; Hemoglobin 14.0; Platelets 200; Potassium 4.1; Sodium 139  No results found for requested labs within last 365 days.   Estimated Creatinine Clearance: 67.3 mL/min (by C-G formula based on Cr of 1.21).   Wt Readings from Last 3 Encounters:  06/27/15  212 lb (96.163 kg)  06/15/15 216 lb (97.977 kg)  06/07/15 218 lb 3.2 oz (98.975 kg)     Other studies reviewed: Additional studies/records reviewed today include: summarized above  DEVICE information: Medtronicdual chamber PPM, pulse generator, serial number [id number] H gen change by Dr. Ladona Ridgel 06/27/15. Original implant was 2007  ASSESSMENT AND PLAN: ** site check ** remotes ** device info  1. Bradycardia, sick sinus syndrome, chronotropic incompitance/PPM     ** s/p gen change, site is well healed     ** 3 month visit with Dr. Graciela Husbands     Continue q 20month [facility] thereafter  2. HTN     ** appears  stable   Disposition: F/u with **  Current medicines are reviewed at length with the patient today.  The patient did not have any concerns regarding medicines.Judith Blonder, PA-C 06/27/2015 6:36 PM     [facility] [address] [place] [address] [phone] (office)  [phone] (fax)    This encounter was created in error - please disregard.

## 2015-06-27 NOTE — Discharge Instructions (Signed)
Pacemaker Battery Change, Care After Refer to this sheet in the next few weeks. These instructions provide you with information on caring for yourself after your procedure. Your health care provider may also give you more specific instructions. Your treatment has been planned according to current medical practices, but problems sometimes occur. Call your health care provider if you have any problems or questions after your procedure. WHAT TO EXPECT AFTER THE PROCEDURE After your procedure, it is typical to have the following sensations:  Soreness at the pacemaker site. HOME CARE INSTRUCTIONS   Keep the incision clean and dry.  Unless advised otherwise, you may shower beginning 24 hours after your procedure.  For the first week after the replacement, avoid stretching motions that pull at the incision site, and avoid heavy exercise with the arm that is on the same side as the incision.  Take medicines only as directed by your health care provider.  Keep all follow-up visits as directed by your health care provider. SEEK MEDICAL CARE IF:   You have pain at the incision site that is not relieved by over-the-counter or prescription medicine.  There is drainage or pus from the incision site.  There is swelling larger than a lime at the incision site.  You develop red streaking that extends above or below the incision site.  You feel brief, intermittent palpitations, light-headedness, or any symptoms that you feel might be related to your heart. SEEK IMMEDIATE MEDICAL CARE IF:   You experience chest pain that is different than the pain at the pacemaker site.  You experience shortness of breath.  You have palpitations or irregular heartbeat.  You have light-headedness that does not go away quickly.  You faint.  You have pain that gets worse and is not relieved by medicine.   This information is not intended to replace advice given to you by your health care provider. Make sure you  discuss any questions you have with your health care provider.   Document Released: 02/09/2013 Document Revised: 05/12/2014 Document Reviewed: 02/09/2013 Elsevier Interactive Patient Education [employer]

## 2015-06-27 NOTE — H&P (View-Only) (Signed)
Electrophysiology Office Note Date: 06/07/2015  ID:  [name], [name] 05-Dec-1945, MRN [id number]  PCP: [name], MD Electrophysiologist: Graciela Husbands   CC: Pacemaker at [facility]   [name] [name] is a 70 y.o. male seen today for Dr Graciela Husbands.  His pacemaker has been found to be at [facility] and she presents today to discuss generator change.   Since last being seen in our clinic, the patient reports doing relatively well.  He has had some increased shortness of breath and exercise intolerance since reaching [facility]. He has been through several hip procedures recently.  He denies chest pain, palpitations, dyspnea, PND, orthopnea, nausea, vomiting, dizziness, syncope, edema, weight gain, or early satiety.  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 Hypertension   . Headache(784.0)   . Bradycardia     pacemaker - Medtronic Adapta #ADDRO1 - December 22, 2005  . Hyperlipidemia   . Depression   . Insomnia   . Chronic back pain   . COPD (chronic obstructive pulmonary disease) (HCC)   . Diabetes mellitus     type II controled with diet  . GERD (gastroesophageal reflux disease)   . Arthritis   . Neuromuscular disorder (HCC)     peripheral neuropathy BLE   Past Surgical History  Procedure Laterality Date  . Back surgery    . Hemorrhoid surgery    . Pacemaker insertion  ?2007  . Insert / replace / remove pacemaker    . Total hip arthroplasty Right 01/25/2013    Procedure: TOTAL HIP ARTHROPLASTY ANTERIOR APPROACH;  Surgeon: Velna Ochs, MD;  Location: [facility] OR;  Service: Orthopedics;  Laterality: Right;    Current Outpatient Prescriptions  Medication Sig Dispense Refill  . acetaminophen (TYLENOL) 650 MG CR tablet Take 650 mg by mouth every 8 (eight) hours as needed for pain.    Ailene Ards ELLIPTA 62.5-25 MCG/INH AEPB Inhale 1 puff into the lungs daily.     [name] aspirin 81 MG tablet Take 81 mg by mouth daily.    [name]  atorvastatin (LIPITOR) 40 MG tablet Take 40 mg by mouth daily.    [name] BENICAR 40 MG tablet Take 1 tablet by mouth daily.    [name] docusate sodium (COLACE) 100 MG capsule Take 100 mg by mouth daily.    . famotidine (PEPCID) 20 MG tablet TAKE ONE TABLET BY MOUTH ONCE DAILY AT BEDTIME 30 tablet 0  . fluticasone (FLOVENT HFA) 110 MCG/ACT inhaler 2 puffs then rinse mouth, twice daily 1 Inhaler 12  . gabapentin (NEURONTIN) 300 MG capsule Take 300 mg by mouth 2 (two) times daily.    [name] glimepiride (AMARYL) 1 MG tablet Take 1 tablet by mouth daily.    [name] ipratropium-albuterol (DUONEB) 0.5-2.5 (3) MG/3ML SOLN Inhale 3 mLs into the lungs every 6 (six) hours as needed (wheezing or shortness of breath).     [name] levocetirizine (XYZAL) 5 MG tablet Take 1 tablet by mouth daily.    . montelukast (SINGULAIR) 10 MG tablet Take 1 tablet by mouth daily.    . Multiple Vitamin (MULTIVITAMIN WITH MINERALS) TABS tablet Take 1 tablet by mouth daily.    . pantoprazole (PROTONIX) 40 MG tablet TAKE ONE TABLET BY MOUTH ONCE DAILY TAKE  30-60  MINUTES  BEFORE  FIRST  MEAL  OF  THE  DAY 30 tablet 0  . PROAIR HFA 108 (90 BASE) MCG/ACT inhaler Inhale 2 puffs into  the lungs 4 (four) times daily as needed.    . [DISCONTINUED] cloNIDine (CATAPRES) 0.1 MG tablet Take 1 tablet (0.1 mg total) by mouth 2 (two) times daily. 60 tablet 11   No current facility-administered medications for this visit.    Allergies:   Penicillins; Metoprolol tartrate; and Codeine   Social History: Social History   Social History  . Marital Status: Legally Separated    Spouse Name: N/A  . Number of Children: 2  . Years of Education: `   Occupational History  . Retired    Social History Main Topics  . Smoking status: Former Smoker -- 2.00 packs/day for 15 years    Types: Cigarettes    Start date: 05/05/1958    Quit date: 05/05/1973  . Smokeless tobacco: Never Used  . Alcohol Use: 0.0 oz/week    0 Standard drinks or equivalent per week     Comment:  beer occ  . Drug Use: No  . Sexual Activity: Not on file   Other Topics Concern  . Not on file   Social History Narrative    Family History: Family History  Problem Relation Age of Onset  . Heart disease Mother   . Asthma Maternal Aunt   . Allergies      whole family     Review of Systems: All other systems reviewed and are otherwise negative except as noted above.   Physical Exam: VS:  BP 114/72 mmHg  Pulse 65  Ht 6' (1.829 m)  Wt 218 lb 3.2 oz (98.975 kg)  BMI 29.59 kg/m2  SpO2 98% , BMI Body mass index is 29.59 kg/(m^2).  GEN- The patient is elderly appearing, alert and oriented x 3 today.   HEENT: normocephalic, atraumatic; sclera clear, conjunctiva pink; hearing intact; oropharynx clear; neck supple Lungs- Clear to ausculation bilaterally, normal work of breathing.  No wheezes, rales, rhonchi Heart- Regular rate and rhythm, no murmurs, rubs or gallops GI- soft, non-tender, non-distended, bowel sounds present Extremities- no clubbing, cyanosis, or edema; DP/PT/radial pulses 2+ bilaterally MS- no significant deformity or atrophy Skin- warm and dry, no rash or lesion; PPM pocket well healed Psych- euthymic mood, full affect Neuro- strength and sensation are intact  PPM Interrogation- reviewed in detail today,  See PACEART report  EKG:  EKG is not ordered today.  Recent Labs: 08/28/2014: BUN 10; Creatinine, Ser 1.08; Hemoglobin 14.3; Platelets 237.0; Potassium 4.1; Pro B Natriuretic peptide (BNP) 61.0; Sodium 132*   Wt Readings from Last 3 Encounters:  06/07/15 218 lb 3.2 oz (98.975 kg)  01/25/15 214 lb 15.2 oz (97.5 kg)  12/15/14 215 lb (97.523 kg)     Other studies Reviewed: Additional studies/ records that were reviewed today include: Dr Odessa Fleming office notes  Assessment and Plan:  1.  Symptomatic bradycardia Pacemaker at [facility].  Risks, benefits, alternatives to pacemaker generator change discussed with the patient today who would like to proceed. Will  schedule with Dr Graciela Husbands at the next available time.   2.  HTN Stable No change required today   Current medicines are reviewed at length with the patient today.   The patient does not have concerns regarding his medicines.  The following changes were made today:  none  Labs/ tests ordered today include: pre-procedure labs    Disposition:   Follow up with Dr Graciela Husbands following generator change    Signed, Gypsy Balsam, NP 06/07/2015 1:09 PM  [facility] [address] [place] [address] [phone] (office) [phone] (  fax)

## 2015-06-27 NOTE — Interval H&P Note (Signed)
History and Physical Interval Note:  06/27/2015 12:50 PM  [name] [name]  has presented today for surgery, with the diagnosis of bradicardia  The various methods of treatment have been discussed with the patient and family. After consideration of risks, benefits and other options for treatment, the patient has consented to  Procedure(s):  PPM Generator Changeout (N/A) as a surgical intervention .  The patient's history has been reviewed, patient examined, no change in status, stable for surgery.  I have reviewed the patient's chart and labs.  Questions were answered to the patient's satisfaction.     Sherryl Manges  Procedure reviewed We have reviewed the benefits and risks of generator replacement.  These include but are not limited to lead fracture and infection.  The patient understands, agrees and is willing to proceed.

## 2015-06-28 ENCOUNTER — Encounter: Payer: Medicare Other | Admitting: Physician Assistant

## 2015-06-28 ENCOUNTER — Encounter (HOSPITAL_COMMUNITY): Payer: Self-pay | Admitting: Internal Medicine

## 2015-06-29 ENCOUNTER — Telehealth: Payer: Self-pay | Admitting: Internal Medicine

## 2015-06-29 NOTE — Telephone Encounter (Signed)
Pt had pacemaker put in on Wednesday. His blood pressure was up high. Pt is calling because he is still concerned about it and his pulse rate.

## 2015-06-29 NOTE — Telephone Encounter (Signed)
Pt called stated he thinks his BP and HR are still elevated and it was told to him the other day during his procedure that his BP was high and he wanted to know next steps.  Pt readings today: 157/92 HR 88 (8am); 171/91 HR 102 (12pm); 164/96 HR 100 (1pm) today. Yesterday pt stated that his BP was 137/80 (am) 156/87 (noon). Pt stated he feels find has not complaints. Pt educated to sit for 10-15 minutes after moving around before checking pressure and only check twice a day unless he has symptoms. Pt instructed to continue to check daily BP and call us early next week if he still having elevated BP's. Pt verbalized understanding, no questions additional questions at this time.

## 2015-07-04 ENCOUNTER — Encounter: Payer: Self-pay | Admitting: Physician Assistant

## 2015-07-05 ENCOUNTER — Ambulatory Visit (INDEPENDENT_AMBULATORY_CARE_PROVIDER_SITE_OTHER): Payer: Medicare Other | Admitting: *Deleted

## 2015-07-05 ENCOUNTER — Encounter: Payer: Self-pay | Admitting: Internal Medicine

## 2015-07-05 DIAGNOSIS — I495 Sick sinus syndrome: Secondary | ICD-10-CM

## 2015-07-05 DIAGNOSIS — Z950 Presence of cardiac pacemaker: Secondary | ICD-10-CM | POA: Diagnosis not present

## 2015-07-05 DIAGNOSIS — I498 Other specified cardiac arrhythmias: Secondary | ICD-10-CM

## 2015-07-05 LAB — CUP PACEART INCLINIC DEVICE CHECK
Battery Impedance: 100 Ohm
Brady Statistic AP VP Percent: 0 %
Brady Statistic AP VS Percent: 91 %
Date Time Interrogation Session: 20170302140938
Implantable Lead Implant Date: 20070820
Implantable Lead Location: 753859
Lead Channel Impedance Value: 547 Ohm
Lead Channel Pacing Threshold Amplitude: 0.75 V
Lead Channel Sensing Intrinsic Amplitude: 2.8 mV
Lead Channel Sensing Intrinsic Amplitude: 4 mV
Lead Channel Setting Pacing Pulse Width: 0.46 ms
Lead Channel Setting Sensing Sensitivity: 1.4 mV
MDC IDC LEAD IMPLANT DT: 20070820
MDC IDC LEAD LOCATION: 753860
MDC IDC MSMT BATTERY REMAINING LONGEVITY: 148 mo
MDC IDC MSMT BATTERY VOLTAGE: 2.79 V
MDC IDC MSMT LEADCHNL RA IMPEDANCE VALUE: 466 Ohm
MDC IDC MSMT LEADCHNL RA PACING THRESHOLD AMPLITUDE: 1 V
MDC IDC MSMT LEADCHNL RA PACING THRESHOLD PULSEWIDTH: 0.4 ms
MDC IDC MSMT LEADCHNL RV PACING THRESHOLD PULSEWIDTH: 0.46 ms
MDC IDC SET LEADCHNL RA PACING AMPLITUDE: 2 V
MDC IDC SET LEADCHNL RV PACING AMPLITUDE: 2.5 V
MDC IDC STAT BRADY AS VP PERCENT: 0 %
MDC IDC STAT BRADY AS VS PERCENT: 9 %

## 2015-07-05 NOTE — Progress Notes (Signed)
Wound check appointment. Dermabond removed. Wound without redness or edema. Incision edges approximated with exception of pinhole opening in medial wound. GT saw patient, recommended washing with soap and water and calling if he develops any signs/symptoms of infection. Patient verbalizes understanding of instructions. Wound otherwise well healed. Normal device function. Thresholds, sensing, and impedances consistent with implant measurements. Device programmed at chronic outputs. Histogram distribution appropriate for patient and level of activity. 2 mode switches, no EGMs as duration <30 sec. No high ventricular rates noted. Patient educated about wound care, arm mobility, lifting restrictions. ROV with SK in 3 months.

## 2015-08-10 ENCOUNTER — Ambulatory Visit (INDEPENDENT_AMBULATORY_CARE_PROVIDER_SITE_OTHER): Payer: Medicare Other | Admitting: Internal Medicine

## 2015-08-10 ENCOUNTER — Encounter: Payer: Self-pay | Admitting: Internal Medicine

## 2015-08-10 VITALS — BP 122/72 | HR 69 | Ht 72.5 in | Wt 220.6 lb

## 2015-08-10 DIAGNOSIS — J449 Chronic obstructive pulmonary disease, unspecified: Secondary | ICD-10-CM

## 2015-08-10 DIAGNOSIS — J4489 Other specified chronic obstructive pulmonary disease: Secondary | ICD-10-CM

## 2015-08-10 DIAGNOSIS — R05 Cough: Secondary | ICD-10-CM | POA: Diagnosis not present

## 2015-08-10 DIAGNOSIS — R058 Other specified cough: Secondary | ICD-10-CM

## 2015-08-10 LAB — PULMONARY FUNCTION TEST
DL/VA % PRED: 99 %
DL/VA: 4.73 ml/min/mmHg/L
DLCO COR: 23.93 ml/min/mmHg
DLCO UNC % PRED: 57 %
DLCO UNC: 20.62 ml/min/mmHg
DLCO cor % pred: 67 %
FEF 25-75 POST: 3.69 L/s
FEF 25-75 PRE: 3.9 L/s
FEF2575-%CHANGE-POST: -5 %
FEF2575-%PRED-POST: 141 %
FEF2575-%PRED-PRE: 149 %
FEV1-%Change-Post: 0 %
FEV1-%PRED-POST: 85 %
FEV1-%Pred-Pre: 85 %
FEV1-Post: 2.99 L
FEV1-Pre: 2.98 L
FEV1FVC-%CHANGE-POST: -1 %
FEV1FVC-%Pred-Pre: 115 %
FEV6-%CHANGE-POST: 1 %
FEV6-%PRED-PRE: 78 %
FEV6-%Pred-Post: 79 %
FEV6-PRE: 3.52 L
FEV6-Post: 3.58 L
FEV6FVC-%CHANGE-POST: 0 %
FEV6FVC-%PRED-PRE: 106 %
FEV6FVC-%Pred-Post: 106 %
FVC-%Change-Post: 1 %
FVC-%PRED-PRE: 73 %
FVC-%Pred-Post: 74 %
FVC-Post: 3.58 L
FVC-Pre: 3.53 L
POST FEV6/FVC RATIO: 100 %
Post FEV1/FVC ratio: 84 %
Pre FEV1/FVC ratio: 84 %
Pre FEV6/FVC Ratio: 100 %
RV % pred: 84 %
RV: 2.21 L
TLC % pred: 74 %
TLC: 5.61 L

## 2015-08-10 NOTE — Patient Instructions (Signed)
Try sips of liquids and otc cough syrup or throat lozenges as needed for mild cough  Please call as needed

## 2015-08-10 NOTE — Progress Notes (Signed)
08/11/14 - 69 yoM with COPD, chronic cough changing to my care, complicated by Cardiac dysrhythmia/ pacemaker, HBP    Daughter here Referred courtesy of Dr. Tiburcio Pea [facility] 3/13-16/2016 AECOPD, Acute Resp Failure. Discharged on levaquin, prednisone Changing from MW to CY-cough. Trying to get better to get hip surgery to correct hip from previous surgery.  He describes nonproductive cough and shortness of breath that have come and gone over the past 2 years. He had his daughter associate onset with a right hip surgery that didn't go well and requires surgical revision now. He quit smoking in 1975. Remote pneumonia. Never ventilator support. No history of DVT or pulmonary embolism and no diagnosis of asthma. Hospital for acute exacerbation recently, triggered by working outdoors in wind and pollen. Finished Levaquin and prednisone yesterday. Denies choking with food or drink. He does not wake at night coughing or short of breath. Currently using nebulizer with DuoNeb, Anoro inhaler, using pro-air rescue inhaler 3 or 4 times daily, and Singulair. Steroids help. Has used oxygen at 2 L for sleep and when necessary/Advanced since hospital stay. Walking is limited by hip pain before dyspnea. Several in family with asthma and allergy problems. 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 CXR 07/18/14 FINDINGS: The heart size and mediastinal contours are within normal limits. Both lungs are clear. No pneumothorax or pleural effusion is noted. Left-sided pacemaker is unchanged in position. The visualized skeletal structures are unremarkable. IMPRESSION: No active cardiopulmonary disease. Electronically Signed  By: Lupita Raider, M.D.  On: 07/18/2014 10:53 Office Spirometry 08/11/2014-moderate restriction of exhaled volume. FVC 2.98/62%, FEV1 2.37/65%, FEV1/FVC 0.79, FEF 25-75 percent 2.43/77%.  12/14/14-69 yoM with COPD, chronic cough  changing to my care, complicated by Cardiac dysrhythmia/ pacemaker, HBP FOLLOWS FOR: pt has no complaints today.   No longer has home O2 Little cough, feeling stable and comfortable. Continues Anoro, Flovent 110, Singulair, Neb/albuterol, Proair.  08/10/2015-70 year old male former smoker followed for COPD, restrictive lung disease, cough complicated by cardiac arrhythmias/pacemaker, HBP Seen by NP 06/15/2015 for acute exacerbation COPD treated with doxycycline, prednisone taper, Mucinex DM FOLLOWS FOR: Pt states his breathing is better since visit with TP on 06/15/15; Review PFT with patient. Less cough than past but some throat tickle. Denies need for cough medicine. PFT 08/10/2015-mild restriction, diffusion moderately reduced, minimal obstructive airways disease suggested by flow volume Loop configuration only. No response to bronchodilator. CXR 06/15/2015 IMPRESSION: 1. Cardiac pacer with lead tips in right atrium and right ventricle. Is mild cardiomegaly. No pulmonary venous congestion 2. Stable left base subsegmental atelectasis and/or scarring. Electronically Signed  By: Maisie Fus Register  On: 06/15/2015 10:59  ROS-see HPI   Negative unless "+" Constitutional:    weight loss, night sweats, fevers, chills, fatigue, lassitude. HEENT:    headaches, difficulty swallowing, tooth/dental problems, sore throat,       sneezing, itching, ear ache, nasal congestion, post nasal drip, snoring CV:    chest pain, orthopnea, PND, swelling in lower extremities, anasarca,                                                           dizziness, palpitations Resp:   +shortness of breath with exertion or at rest.  productive cough,   non-productive cough, coughing up of blood.              change in color of mucus.  wheezing.   Skin:    rash or lesions. GI:  No-   heartburn, indigestion, abdominal pain, nausea, vomiting, GU:  MS:   joint pain, stiffness,  Neuro-     nothing  unusual Psych:  change in mood or affect.  depression or anxiety.   memory loss.  OBJ- Physical Exam General- Alert, Oriented, Affect-appropriate, Distress- none acute Skin- rash-none, lesions- none, excoriation- none Lymphadenopathy- none Head- atraumatic            Eyes- Gross vision intact, PERRLA, conjunctivae and secretions clear            Ears- Hearing, canals-normal            Nose- Clear, no-Septal dev, mucus, polyps, erosion, perforation             Throat- Mallampati II , mucosa clear , drainage- none, tonsils- atrophic, edentulous+ Neck- flexible , trachea midline, no stridor , thyroid nl, carotid no bruit Chest - symmetrical excursion , unlabored           Heart/CV- RRR , no murmur , no gallop  , no rub, nl s1 s2                           - JVD- none , edema- none, stasis changes- none, varices- none           Lung- clear to P&A, wheeze-none, cough-none, dullness-none, rub- none, 98% saturation at rest on room air           Chest wall- +L pacemaker Abd-  Br/ Gen/ Rectal- Not done, not indicated Extrem- cyanosis- none, clubbing, none, atrophy- none, strength- nl, +cane Neuro- grossly intact to observation

## 2015-08-10 NOTE — Progress Notes (Signed)
PFT done today. 08/10/2015  

## 2015-08-20 NOTE — Assessment & Plan Note (Signed)
Cough is improved. Watch response to spring pollen season.

## 2015-08-20 NOTE — Assessment & Plan Note (Signed)
Minimal obstructive airways by flow data. Reduced diffusion capacity may indicate an emphysema component from old smoking. He is comfortable with his current status and medications are okay

## 2015-08-23 ENCOUNTER — Ambulatory Visit (INDEPENDENT_AMBULATORY_CARE_PROVIDER_SITE_OTHER): Payer: Medicare Other | Admitting: Internal Medicine

## 2015-08-23 ENCOUNTER — Encounter: Payer: Self-pay | Admitting: Internal Medicine

## 2015-08-23 VITALS — BP 124/68 | HR 84 | Ht 72.5 in | Wt 213.8 lb

## 2015-08-23 DIAGNOSIS — J441 Chronic obstructive pulmonary disease with (acute) exacerbation: Secondary | ICD-10-CM

## 2015-08-23 DIAGNOSIS — J449 Chronic obstructive pulmonary disease, unspecified: Secondary | ICD-10-CM | POA: Diagnosis not present

## 2015-08-23 MED ORDER — LEVALBUTEROL HCL 0.63 MG/3ML IN NEBU
0.6300 mg | INHALATION_SOLUTION | Freq: Once | RESPIRATORY_TRACT | Status: AC
  Administered 2015-08-23: 0.63 mg via RESPIRATORY_TRACT

## 2015-08-23 MED ORDER — HYDROCODONE-HOMATROPINE 5-1.5 MG/5ML PO SYRP
5.0000 mL | ORAL_SOLUTION | Freq: Four times a day (QID) | ORAL | Status: DC | PRN
Start: 2015-08-23 — End: 2015-09-18

## 2015-08-23 MED ORDER — AZITHROMYCIN 250 MG PO TABS: ORAL_TABLET | ORAL | Status: DC

## 2015-08-23 MED ORDER — METHYLPREDNISOLONE ACETATE 80 MG/ML IJ SUSP
80.0000 mg | Freq: Once | INTRAMUSCULAR | Status: AC
  Administered 2015-08-23: 80 mg via INTRAMUSCULAR

## 2015-08-23 MED ORDER — PREDNISONE 10 MG PO TABS: ORAL_TABLET | ORAL | Status: DC

## 2015-08-23 NOTE — Progress Notes (Signed)
08/11/14 - 69 yoM with COPD, chronic cough changing to my care, complicated by Cardiac dysrhythmia/ pacemaker, HBP    D[facility] here [facility] AECOPD, Acute Resp Failure. Discharged on levaquin, prednisone Changing from MW to CY-cough. Trying to get better to get hip surgery to correct hip from previous surgery.  He describes nonproductive cough and shortness of breath that have come and gone over the past 2 years. He had his daughter associate onset with a right hip surgery that didn't go well and requires surgical revision now. He quit smoking in 1975. Remote pneumonia. Never ventilator support. No history of DVT or pulmonary embolism and no diagnosis of asthma. Hospital for acute exacerbation recently, triggered by working outdoors in wind and pollen. Finished Levaquin and prednisone yesterday. Denies choking with food or drink. He does not wake at night coughing or short of breath. Currently using nebulizer with DuoNeb, Anoro inhaler, using pro-air rescue inhaler 3 or 4 times daily, and Singulair. Steroids help. Has used oxygen at 2 L for sleep and when necessary/Advanced since hospital stay. Walking is limited by hip pain before dyspnea. Several in family with asthma and allergy problems. 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 CXR 07/18/14 FINDINGS: The heart size and mediastinal contours are within normal limits. Both lungs are clear. No pneumothorax or pleural effusion is noted. Left-sided pacemaker is unchanged in position. The visualized skeletal structures are unremarkable. IMPRESSION: No active cardiopulmonary disease. Electronically Signed  By: Lupita RaiderJames Green Jr, M.D.  On: 07/18/2014 10:53 Office Spirometry 08/11/2014-moderate restriction of exhaled volume. FVC 2.98/62%, FEV1 2.37/65%, FEV1/FVC 0.79, FEF 25-75 percent 2.43/77%.  12/14/14-69 yoM with COPD, chronic cough  changing to my care, complicated by Cardiac dysrhythmia/ pacemaker, HBP FOLLOWS FOR: pt has no complaints today.   No longer has home O2 Little cough, feeling stable and comfortable. Continues Anoro, Flovent 110, Singulair, Neb/albuterol, Proair.  08/10/2015-70 year old male former smoker followed for COPD, restrictive lung disease, cough complicated by cardiac arrhythmias/pacemaker, HBP Seen by NP 06/15/2015 for acute exacerbation COPD treated with doxycycline, prednisone taper, Mucinex DM FOLLOWS FOR: Pt states his breathing is better since visit with TP on 06/15/15; Review PFT with patient. Less cough than past but some throat tickle. Denies need for cough medicine. PFT 08/10/2015-mild restriction, diffusion moderately reduced, minimal obstructive airways disease suggested by flow volume Loop configuration only. No response to bronchodilator. CXR 06/15/2015 IMPRESSION: 1. Cardiac pacer with lead tips in right atrium and right ventricle. Is mild cardiomegaly. No pulmonary venous congestion 2. Stable left base subsegmental atelectasis and/or scarring. Electronically Signed  By: Maisie Fushomas Register  On: 06/15/2015 10:59  08/23/2015-70 year old male former smoker followed for COPD, restrictive lung disease, cough complicated by cardiac arrhythmia/pacemaker, HBP ACUTE VISIT: Pt states he has had cough, sore throat, hard to eat solid foods since last Tuesday-feels like his throat is tearing everytime he coughs. Pt has been able to fluids down but has lost some weight. Scant "chunks" of sputum. Nebulizer some help. Doubts fever.  ROS-see HPI   Negative unless "+" Constitutional:    weight loss, night sweats, fevers, chills, fatigue, lassitude. HEENT:    headaches, difficulty swallowing, tooth/dental problems, sore throat,       sneezing, itching, ear ache, nasal congestion, post nasal drip, snoring CV:    chest pain, orthopnea, PND, swelling in lower extremities, anasarca,  dizziness, palpitations Resp:   +shortness of breath with exertion or at rest.                productive cough,   + non-productive cough, coughing up of blood.              change in color of mucus.  + wheezing.   Skin:    rash or lesions. GI:  No-   heartburn, indigestion, abdominal pain, nausea, vomiting, GU:  MS:   joint pain, stiffness,  Neuro-     nothing unusual Psych:  change in mood or affect.  depression or anxiety.   memory loss.  OBJ- Physical Exam General- Alert, Oriented, Affect-appropriate, Distress- none acute Skin- rash-none, lesions- none, excoriation- none Lymphadenopathy- none Head- atraumatic            Eyes- Gross vision intact, PERRLA, conjunctivae and secretions clear            Ears- Hearing, canals-normal            Nose- Clear, no-Septal dev, mucus, polyps, erosion, perforation             Throat- Mallampati II , mucosa clear , drainage- none, tonsils- atrophic, edentulous+ Neck- flexible , trachea midline, no stridor , thyroid nl, carotid no bruit Chest - symmetrical excursion , unlabored           Heart/CV- RRR , no murmur , no gallop  , no rub, nl s1 s2                           - JVD- none , edema- none, stasis changes- none, varices- none           Lung- clear to P&A, wheeze-none, cough-none, dullness-none, rub- none, 98% saturation at rest on room air           Chest wall- +L pacemaker Abd-  Br/ Gen/ Rectal- Not done, not indicated Extrem- cyanosis- none, clubbing, none, atrophy- none, strength- nl, +cane Neuro- grossly intact to observation

## 2015-08-23 NOTE — Patient Instructions (Signed)
Scripts sent for Zpak and prednisone taper  Neb xop 0.63   Dx acute exacerbation of COPD  Depo 80  Script printed for cough syrup

## 2015-08-25 NOTE — Assessment & Plan Note (Signed)
Acute bronchitis syndrome, onset since last visit Plan-Z-Pak, Depo-Medrol, prednisone taper, cough syrup, fluids

## 2015-09-02 ENCOUNTER — Other Ambulatory Visit: Payer: Self-pay | Admitting: Internal Medicine

## 2015-09-18 ENCOUNTER — Encounter: Payer: Self-pay | Admitting: Internal Medicine

## 2015-09-18 ENCOUNTER — Ambulatory Visit (INDEPENDENT_AMBULATORY_CARE_PROVIDER_SITE_OTHER): Payer: Medicare Other | Admitting: Internal Medicine

## 2015-09-18 VITALS — BP 124/82 | HR 69 | Ht 72.0 in | Wt 214.4 lb

## 2015-09-18 DIAGNOSIS — Z950 Presence of cardiac pacemaker: Secondary | ICD-10-CM | POA: Diagnosis not present

## 2015-09-18 DIAGNOSIS — I10 Essential (primary) hypertension: Secondary | ICD-10-CM | POA: Diagnosis not present

## 2015-09-18 DIAGNOSIS — I495 Sick sinus syndrome: Secondary | ICD-10-CM

## 2015-09-18 NOTE — Patient Instructions (Signed)
Medication Instructions: - Your physician recommends that you continue on your current medications as directed. Please refer to the Current Medication list given to you today.  Labwork: - none  Procedures/Testing: - none  Follow-Up: - Remote monitoring is used to monitor your Pacemaker of ICD from home. This monitoring reduces the number of office visits required to check your device to one time per year. It allows us to keep an eye on the functioning of your device to ensure it is working properly. You are scheduled for a device check from home on 12/18/15. You may send your transmission at any time that day. If you have a wireless device, the transmission will be sent automatically. After your physician reviews your transmission, you will receive a postcard with your next transmission date.  - Your physician wants you to follow-up in: 9 months with Dr. Klein. You will receive a reminder letter in the mail two months in advance. If you don't receive a letter, please call our office to schedule the follow-up appointment.  Any Additional Special Instructions Will Be Listed Below (If Applicable).     If you need a refill on your cardiac medications before your next appointment, please call your pharmacy.   

## 2015-09-18 NOTE — Progress Notes (Signed)
Patient Care Team: [name] Blamer, MD as PCP - General (Family Medicine)   HPI  [name] [name] is a 70 y.o. male Seen in followup for pacemaker implanted for chronotropic incompetence and sinus node dysfunction by Dr. Erie Noe.  He underwent gen replacement 2/17  No problems with wound healing  The patient denies chest pain, shortness of breath, nocturnal dyspnea, orthopnea or peripheral edema.  There have been no palpitations, lightheadedness or syncope.    He has a history of diabetes hypertension and COPD. Myoview 9/14 was low risk.  Past Medical History  Diagnosis Date  . Hypertension   . Headache(784.0)   . Bradycardia     pacemaker - Medtronic Adapta #[id number] - December 22, 2005  . Hyperlipidemia   . Depression   . Insomnia   . Chronic back pain   . COPD (chronic obstructive pulmonary disease) (HCC)   . Diabetes mellitus     type II controled with diet  . GERD (gastroesophageal reflux disease)   . Arthritis   . Neuromuscular disorder (HCC)     peripheral neuropathy BLE    Past Surgical History  Procedure Laterality Date  . Back surgery    . Hemorrhoid surgery    . Pacemaker insertion  ?2007  . Insert / replace / remove pacemaker    . Total hip arthroplasty Right 01/25/2013    Procedure: TOTAL HIP ARTHROPLASTY ANTERIOR APPROACH;  Surgeon: Velna Ochs, MD;  Location: [facility] OR;  Service: Orthopedics;  Laterality: Right;  . Ep implantable device N/A 06/27/2015    Procedure:  PPM Generator Changeout;  Surgeon: Duke Salvia, MD;  Location: [facility] INVASIVE CV LAB;  Service: Cardiovascular;  Laterality: N/A;    Current Outpatient Prescriptions  Medication Sig Dispense Refill  . acetaminophen (TYLENOL) 650 MG CR tablet Take 650 mg by mouth every 8 (eight) hours as needed for pain.    Ailene Ards ELLIPTA 62.5-25 MCG/INH AEPB Inhale 1 puff into the lungs daily.     [name] aspirin 81 MG tablet Take 81 mg by mouth daily.    [name] atorvastatin (LIPITOR) 40 MG tablet Take 40 mg by mouth  daily.    [name] BENICAR 40 MG tablet Take 1 tablet by mouth daily.    [name] CALCIUM-MAGNESIUM-ZINC PO Take 1 capsule by mouth daily.    [name] docusate sodium (COLACE) 100 MG capsule Take 100 mg by mouth daily.    . famotidine (PEPCID) 20 MG tablet TAKE ONE TABLET BY MOUTH ONCE DAILY AT BEDTIME 30 tablet 0  . FLOVENT HFA 110 MCG/ACT inhaler INHALE TWO PUFFS BY MOUTH TWICE DAILY THEN  RINSE  MOUTH 1 Inhaler 4  . gabapentin (NEURONTIN) 300 MG capsule Take 300 mg by mouth 2 (two) times daily.    [name] glimepiride (AMARYL) 1 MG tablet Take 1 tablet by mouth daily.    [name] ipratropium-albuterol (DUONEB) 0.5-2.5 (3) MG/3ML SOLN Inhale 3 mLs into the lungs every 6 (six) hours as needed (wheezing or shortness of breath).     [name] levocetirizine (XYZAL) 5 MG tablet Take 1 tablet by mouth daily.    . montelukast (SINGULAIR) 10 MG tablet Take 1 tablet by mouth at bedtime.     . Multiple Vitamin (MULTIVITAMIN WITH MINERALS) TABS tablet Take 1 tablet by mouth daily.    . pantoprazole (PROTONIX) 40 MG tablet TAKE ONE TABLET BY MOUTH ONCE DAILY TAKE  30-60  MINUTES  BEFORE  FIRST  MEAL  OF  THE  DAY 30  tablet 0  . PROAIR HFA 108 (90 BASE) MCG/ACT inhaler Inhale 2 puffs into the lungs 4 (four) times daily as needed.    . [DISCONTINUED] cloNIDine (CATAPRES) 0.1 MG tablet Take 1 tablet (0.1 mg total) by mouth 2 (two) times daily. 60 tablet 11   No current facility-administered medications for this visit.    Allergies  Allergen Reactions  . Penicillins Anaphylaxis  . Metoprolol Tartrate Other (See Comments)    HR drops too low  . Codeine Rash    Review of Systems negative except from HPI and PMH  Physical Exam BP 124/82 mmHg  Pulse 69  Ht 6' (1.829 m)  Wt 214 lb 6.4 oz (97.251 kg)  BMI 29.07 kg/m2 Well developed and well nourished in no acute distress HENT normal E scleral and icterus clear Neck Supple JVP flat; carotids brisk and full Clear to ausculation  Regular rate and rhythm, +S4Soft with active bowel  sounds No clubbing cyanosis  Edema Alert and oriented, grossly normal motor and sensory function Skin Warm and Dry    Assessment and  Plan  Sinus node dysfunction  Pacemaker Medtronic  Hypertension      Reprogrammed the rate response algorithm to decrease ADL rate and increase exetional slope  BP well conrolled

## 2015-09-21 LAB — CUP PACEART INCLINIC DEVICE CHECK
Battery Voltage: 2.8 V
Brady Statistic AP VP Percent: 0 %
Brady Statistic AS VP Percent: 0 %
Date Time Interrogation Session: 20170516173402
Implantable Lead Location: 753859
Implantable Lead Model: 4092
Implantable Lead Model: 5076
Lead Channel Pacing Threshold Amplitude: 0.75 V
Lead Channel Pacing Threshold Amplitude: 0.875 V
Lead Channel Pacing Threshold Pulse Width: 0.4 ms
Lead Channel Sensing Intrinsic Amplitude: 4 mV
MDC IDC LEAD IMPLANT DT: 20070820
MDC IDC LEAD IMPLANT DT: 20070820
MDC IDC LEAD LOCATION: 753860
MDC IDC MSMT BATTERY IMPEDANCE: 100 Ohm
MDC IDC MSMT BATTERY REMAINING LONGEVITY: 124 mo
MDC IDC MSMT LEADCHNL RA IMPEDANCE VALUE: 480 Ohm
MDC IDC MSMT LEADCHNL RV IMPEDANCE VALUE: 601 Ohm
MDC IDC MSMT LEADCHNL RV PACING THRESHOLD PULSEWIDTH: 0.4 ms
MDC IDC SET LEADCHNL RA PACING AMPLITUDE: 2 V
MDC IDC SET LEADCHNL RV PACING AMPLITUDE: 2.5 V
MDC IDC SET LEADCHNL RV PACING PULSEWIDTH: 0.46 ms
MDC IDC SET LEADCHNL RV SENSING SENSITIVITY: 1.4 mV
MDC IDC STAT BRADY AP VS PERCENT: 93 %
MDC IDC STAT BRADY AS VS PERCENT: 7 %

## 2015-12-18 ENCOUNTER — Telehealth: Payer: Self-pay | Admitting: Cardiology

## 2015-12-18 ENCOUNTER — Encounter: Payer: Medicare Other | Admitting: *Deleted

## 2015-12-18 NOTE — Telephone Encounter (Signed)
Spoke with pt and reminded pt of remote transmission that is due today. Pt verbalized understanding.   

## 2015-12-21 ENCOUNTER — Encounter: Payer: Self-pay | Admitting: Cardiology

## 2015-12-28 ENCOUNTER — Telehealth: Payer: Self-pay | Admitting: Internal Medicine

## 2015-12-28 NOTE — Telephone Encounter (Signed)
New message        FYI       Pt states that he can not send a transmission b/c he can't get a signal where he lives. He will be getting a piece to hook to his monitor and telephone to help him get a stronger cell signal. He will send transmission as soon as he gets the piece.

## 2015-12-28 NOTE — Telephone Encounter (Signed)
Noted in paceart.  

## 2015-12-31 ENCOUNTER — Ambulatory Visit (INDEPENDENT_AMBULATORY_CARE_PROVIDER_SITE_OTHER): Payer: Medicare Other | Admitting: *Deleted

## 2015-12-31 DIAGNOSIS — I495 Sick sinus syndrome: Secondary | ICD-10-CM | POA: Diagnosis not present

## 2015-12-31 DIAGNOSIS — Z950 Presence of cardiac pacemaker: Secondary | ICD-10-CM

## 2016-01-01 NOTE — Progress Notes (Signed)
Remote pacemaker transmission.   

## 2016-01-02 LAB — CUP PACEART REMOTE DEVICE CHECK
Battery Impedance: 100 Ohm
Battery Remaining Longevity: 141 mo
Battery Voltage: 2.79 V
Brady Statistic AP VP Percent: 0 %
Date Time Interrogation Session: 20170828193552
Implantable Lead Implant Date: 20070820
Implantable Lead Location: 753860
Lead Channel Impedance Value: 453 Ohm
Lead Channel Pacing Threshold Amplitude: 1.25 V
Lead Channel Setting Pacing Amplitude: 2 V
Lead Channel Setting Pacing Amplitude: 2.5 V
Lead Channel Setting Pacing Pulse Width: 0.4 ms
MDC IDC LEAD IMPLANT DT: 20070820
MDC IDC LEAD LOCATION: 753859
MDC IDC MSMT LEADCHNL RA PACING THRESHOLD AMPLITUDE: 1 V
MDC IDC MSMT LEADCHNL RA PACING THRESHOLD PULSEWIDTH: 0.4 ms
MDC IDC MSMT LEADCHNL RV IMPEDANCE VALUE: 568 Ohm
MDC IDC MSMT LEADCHNL RV PACING THRESHOLD PULSEWIDTH: 0.4 ms
MDC IDC SET LEADCHNL RV SENSING SENSITIVITY: 1.4 mV
MDC IDC STAT BRADY AP VS PERCENT: 81 %
MDC IDC STAT BRADY AS VP PERCENT: 0 %
MDC IDC STAT BRADY AS VS PERCENT: 19 %

## 2016-01-03 ENCOUNTER — Encounter: Payer: Self-pay | Admitting: Cardiology

## 2016-01-18 ENCOUNTER — Encounter: Payer: Self-pay | Admitting: Cardiology

## 2016-02-11 ENCOUNTER — Encounter: Payer: Self-pay | Admitting: Internal Medicine

## 2016-02-11 ENCOUNTER — Ambulatory Visit (INDEPENDENT_AMBULATORY_CARE_PROVIDER_SITE_OTHER): Payer: Medicare Other | Admitting: Internal Medicine

## 2016-02-11 DIAGNOSIS — R05 Cough: Secondary | ICD-10-CM

## 2016-02-11 DIAGNOSIS — J449 Chronic obstructive pulmonary disease, unspecified: Secondary | ICD-10-CM

## 2016-02-11 DIAGNOSIS — R058 Other specified cough: Secondary | ICD-10-CM

## 2016-02-11 MED ORDER — FLUTTER DEVI: 0 refills | Status: AC

## 2016-02-11 NOTE — Progress Notes (Signed)
HPI  M former smoker with COPD, chronic cough  complicated by Cardiac dysrhythmia/ pacemaker, HBP    12/14/14-69 yoM with COPD, chronic cough changing to my care, complicated by Cardiac dysrhythmia/ pacemaker, HBP FOLLOWS FOR: pt has no complaints today.   No longer has home O2 Little cough, feeling stable and comfortable. Continues Anoro, Flovent 110, Singulair, Neb/albuterol, Proair.  08/10/2015-70 year old male former smoker followed for COPD, restrictive lung disease, cough complicated by cardiac arrhythmias/pacemaker, HBP Seen by NP 06/15/2015 for acute exacerbation COPD treated with doxycycline, prednisone taper, Mucinex DM FOLLOWS FOR: Pt states his breathing is better since visit with TP on 06/15/15; Review PFT with patient. Less cough than past but some throat tickle. Denies need for cough medicine. PFT 08/10/2015-mild restriction, diffusion moderately reduced, minimal obstructive airways disease suggested by flow volume Loop configuration only. No response to bronchodilator. CXR 06/15/2015 IMPRESSION: 1. Cardiac pacer with lead tips in right atrium and right ventricle. Is mild cardiomegaly. No pulmonary venous congestion 2. Stable left base subsegmental atelectasis and/or scarring. Electronically Signed  By: Maisie Fushomas Register  On: 06/15/2015 10:59  08/23/2015-70 year old male former smoker followed for COPD, restrictive lung disease, cough complicated by cardiac arrhythmia/pacemaker, HBP ACUTE VISIT: Pt states he has had cough, sore throat, hard to eat solid foods since last Tuesday-feels like his throat is tearing everytime he coughs. Pt has been able to fluids down but has lost some weight. Scant "chunks" of sputum. Nebulizer some help. Doubts fever.  02/11/2016-70 year old male former smoker followed for COPD, restrictive lung disease, cough, complicated by cardiac arrhythmia/pacemaker, HBP, DM FOLLOWS FOR pt has cough about a week ago mucus stuck in throat. mucus was yellow.  Shortness of breath with activity. Wants something instead of mucinex (too costly).  Anoro, ProAir, Neb/ Duoneb, Singulair, gabapentin, Xyzal  Intermittent feeling "like a briar" sensation in his throat at level of the larynx 2 weeks. Taking daily Mucinex but finds that too expensive. Nebulizer and sips of liquids help. No solid food hanging up. No change in breathing otherwise.  ROS-see HPI   Negative unless "+" Constitutional:    weight loss, night sweats, fevers, chills, fatigue, lassitude. HEENT:    headaches, difficulty swallowing, tooth/dental problems, sore throat,       sneezing, itching, ear ache, nasal congestion, post nasal drip, snoring CV:    chest pain, orthopnea, PND, swelling in lower extremities, anasarca,                                                           dizziness, palpitations Resp:   +shortness of breath with exertion or at rest.                productive cough,   + non-productive cough, coughing up of blood.              change in color of mucus.  + wheezing.   Skin:    rash or lesions. GI:  No-   heartburn, indigestion, abdominal pain, nausea, vomiting, GU:  MS:   joint pain, stiffness,  Neuro-     nothing unusual Psych:  change in mood or affect.  depression or anxiety.   memory loss.  OBJ- Physical Exam General- Alert, Oriented, Affect-appropriate, Distress- none acute Skin- rash-none, lesions- none, excoriation- none Lymphadenopathy- none Head- atraumatic  Eyes- Gross vision intact, PERRLA, conjunctivae and secretions clear            Ears- Hearing, canals-normal            Nose- Clear, no-Septal dev, mucus, polyps, erosion, perforation             Throat- Mallampati II , mucosa clear , drainage- none, tonsils- atrophic, edentulous+, not hoarse Neck- flexible , trachea midline, no stridor , thyroid nl, carotid no bruit Chest - symmetrical excursion , unlabored           Heart/CV- RRR , no murmur , no gallop  , no rub, nl s1 s2                            - JVD- none , edema- none, stasis changes- none, varices- none           Lung- clear to P&A, wheeze-none, cough-none, dullness-none, rub- none, 98% saturation at rest on room air           Chest wall- +L pacemaker Abd-  Br/ Gen/ Rectal- Not done, not indicated Extrem- cyanosis- none, clubbing, none, atrophy- none, strength- nl, +cane Neuro- grossly intact to observation

## 2016-02-11 NOTE — Patient Instructions (Signed)
Script printed for Flutter device   Blow through 4 times per set, up to 3 sets per day, when needed. See if this helps you clear your airways better.  Sips of liquids and throat lozenges may help the sticking feeling in your throat.  Ok to just use Mucinex when you need it.

## 2016-02-12 NOTE — Assessment & Plan Note (Signed)
Nonspecific irritated sensation in his throat. Plan-try flutter device, Mucinex, throat lozenges, sips of liquids. If this feeling persists he should have ENT evaluation of his hypopharynx

## 2016-02-12 NOTE — Assessment & Plan Note (Signed)
Aside from throat complaint at this visit he is stable without describing significant cough, wheeze or change in exercise tolerance. No change indicated.

## 2016-03-08 ENCOUNTER — Other Ambulatory Visit: Payer: Self-pay | Admitting: Internal Medicine

## 2016-03-24 ENCOUNTER — Ambulatory Visit
Admission: RE | Admit: 2016-03-24 | Discharge: 2016-03-24 | Disposition: A | Discharge status: Home / Self Care | Payer: Medicare Other | Source: Ambulatory Visit | Attending: Family Medicine | Admitting: Family Medicine

## 2016-03-24 ENCOUNTER — Other Ambulatory Visit: Payer: Self-pay | Admitting: Family Medicine

## 2016-03-24 DIAGNOSIS — M79604 Pain in right leg: Secondary | ICD-10-CM

## 2016-03-24 DIAGNOSIS — M545 Low back pain, unspecified: Secondary | ICD-10-CM

## 2016-03-31 ENCOUNTER — Ambulatory Visit (INDEPENDENT_AMBULATORY_CARE_PROVIDER_SITE_OTHER): Payer: Medicare Other | Admitting: *Deleted

## 2016-03-31 DIAGNOSIS — I495 Sick sinus syndrome: Secondary | ICD-10-CM

## 2016-04-01 NOTE — Progress Notes (Signed)
Remote pacemaker transmission.   

## 2016-04-03 ENCOUNTER — Encounter: Payer: Self-pay | Admitting: Cardiology

## 2016-04-18 ENCOUNTER — Encounter: Payer: Self-pay | Admitting: Cardiology

## 2016-04-18 LAB — CUP PACEART REMOTE DEVICE CHECK
Battery Impedance: 100 Ohm
Battery Remaining Longevity: 142 mo
Brady Statistic AP VP Percent: 0 %
Brady Statistic AS VS Percent: 16 %
Implantable Lead Implant Date: 20070820
Implantable Lead Location: 753860
Implantable Lead Model: 4092
Implantable Pulse Generator Implant Date: 20170222
Lead Channel Impedance Value: 480 Ohm
Lead Channel Impedance Value: 635 Ohm
Lead Channel Setting Pacing Amplitude: 2 V
Lead Channel Setting Pacing Amplitude: 2.5 V
MDC IDC LEAD IMPLANT DT: 20070820
MDC IDC LEAD LOCATION: 753859
MDC IDC MSMT BATTERY VOLTAGE: 2.79 V
MDC IDC MSMT LEADCHNL RA PACING THRESHOLD AMPLITUDE: 0.875 V
MDC IDC MSMT LEADCHNL RA PACING THRESHOLD PULSEWIDTH: 0.4 ms
MDC IDC MSMT LEADCHNL RV PACING THRESHOLD AMPLITUDE: 0.875 V
MDC IDC MSMT LEADCHNL RV PACING THRESHOLD PULSEWIDTH: 0.4 ms
MDC IDC SESS DTM: 20171127150958
MDC IDC SET LEADCHNL RV PACING PULSEWIDTH: 0.4 ms
MDC IDC SET LEADCHNL RV SENSING SENSITIVITY: 1.4 mV
MDC IDC STAT BRADY AP VS PERCENT: 84 %
MDC IDC STAT BRADY AS VP PERCENT: 0 %

## 2016-05-19 IMAGING — DX DG CHEST 2V
2 series · 2 of 2 positions shown · non-contrast
Comparison: 07/18/2014.

CLINICAL DATA: Cough for 2 weeks.

EXAM:
CHEST  2 VIEW

[chest pa]
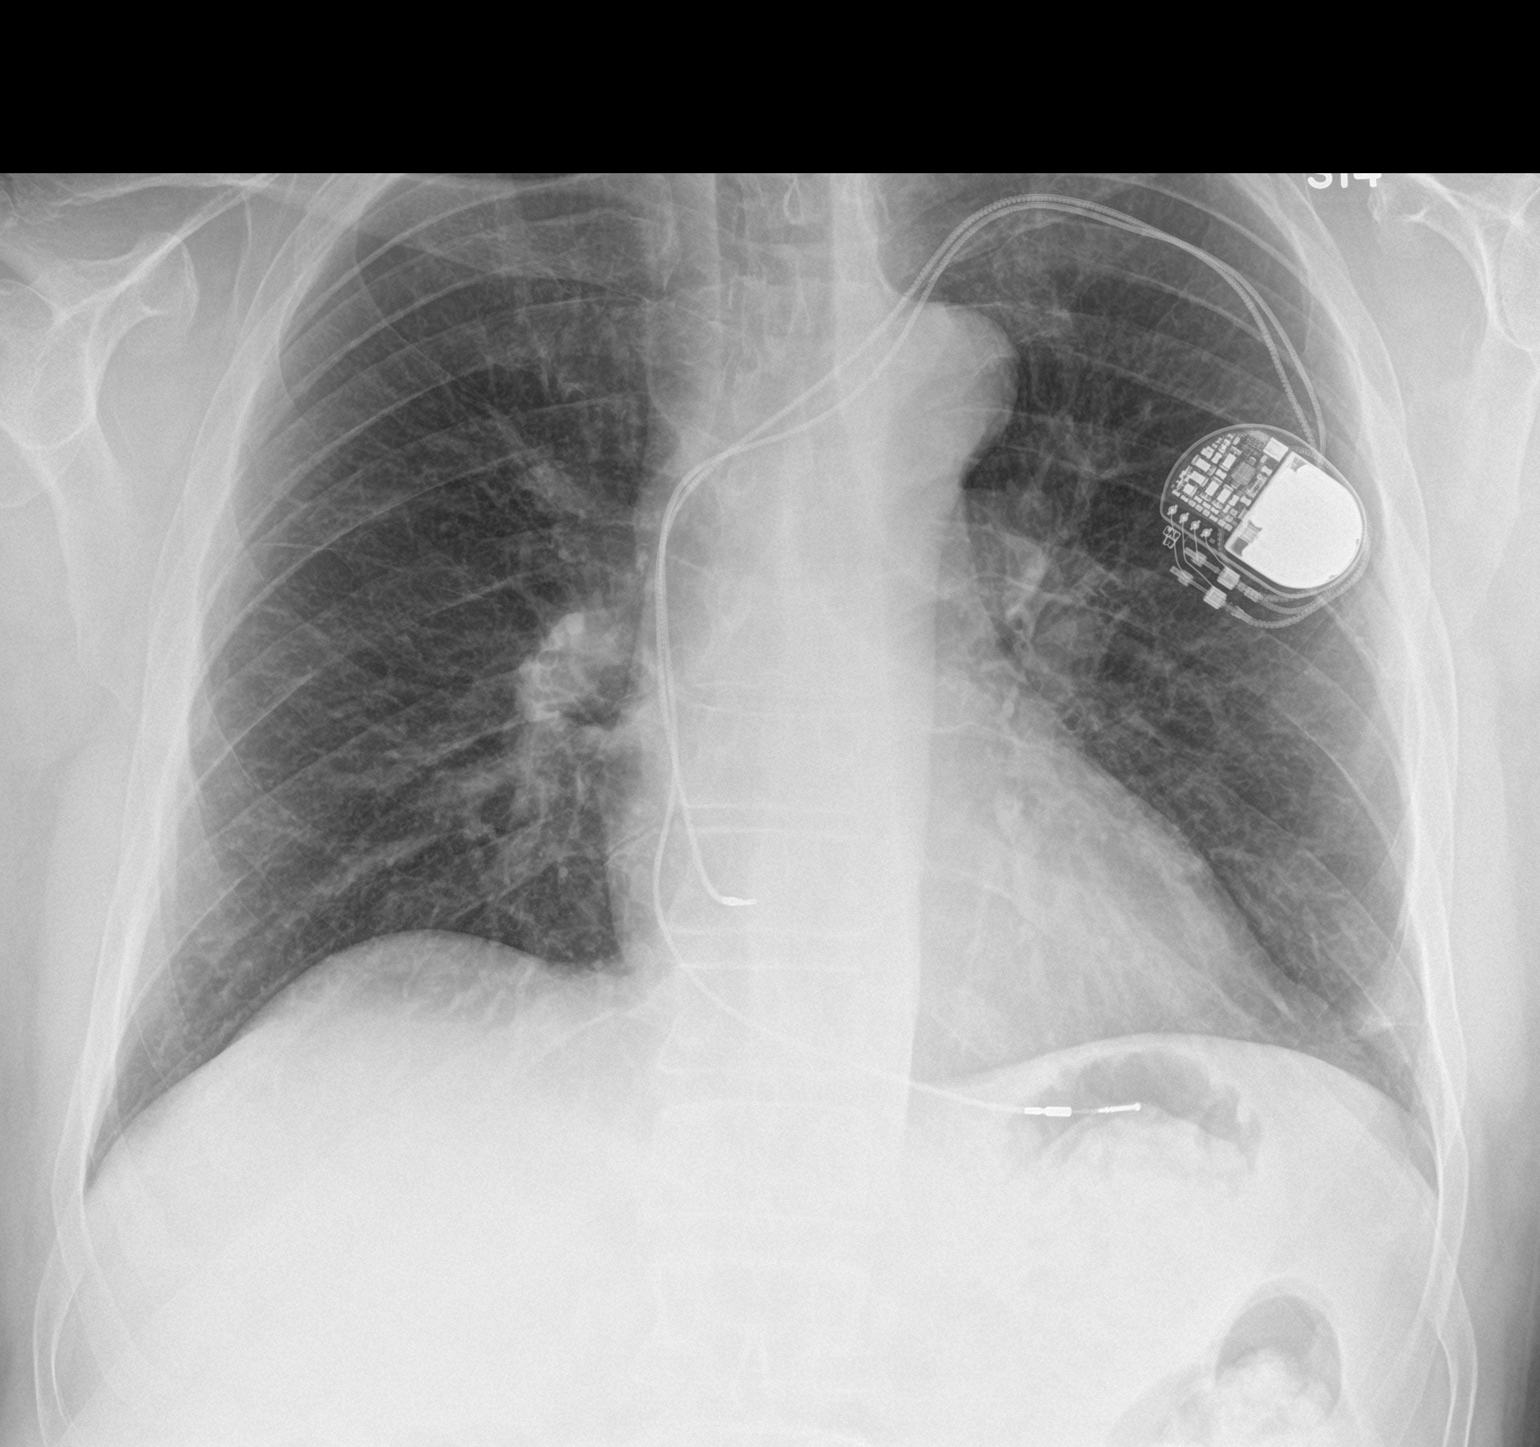

[chest lat]
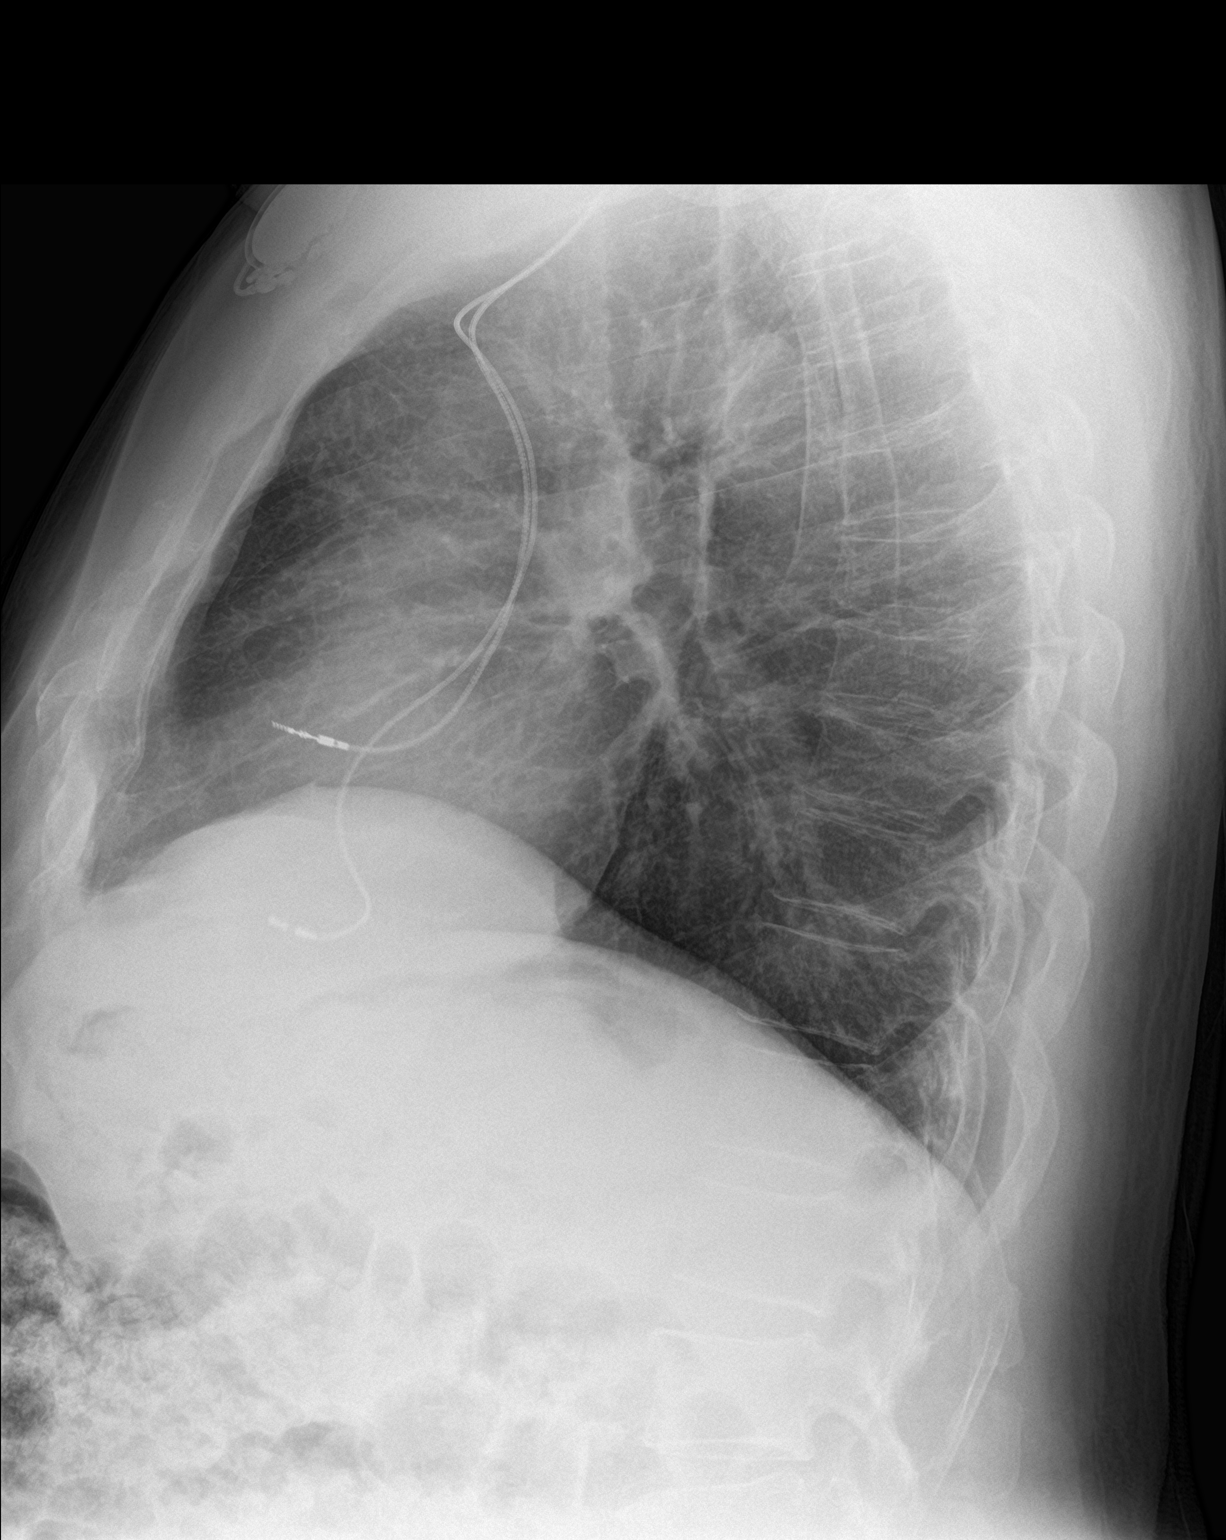

[2 of 2 positions shown; findings below may reference images not displayed]

FINDINGS: Mediastinum hilar structures are normal. Cardiac pacer with lead
tips in right atrium right ventricle. Cardiomegaly with normal
pulmonary vascularity. Low lung volumes with mild basilar
atelectasis. No pleural effusion or pneumothorax.
IMPRESSION: 1. Cardiac pacer with lead tips in right atrium and right ventricle.
Is mild cardiomegaly. No pulmonary venous congestion
2. Stable left base subsegmental atelectasis and/or scarring.

## 2016-06-19 ENCOUNTER — Encounter: Payer: Self-pay | Admitting: Internal Medicine

## 2016-06-19 ENCOUNTER — Ambulatory Visit (INDEPENDENT_AMBULATORY_CARE_PROVIDER_SITE_OTHER): Payer: Medicare Other | Admitting: Internal Medicine

## 2016-06-19 VITALS — BP 106/72 | HR 84 | Ht 72.0 in | Wt 210.6 lb

## 2016-06-19 DIAGNOSIS — Z950 Presence of cardiac pacemaker: Secondary | ICD-10-CM

## 2016-06-19 DIAGNOSIS — I495 Sick sinus syndrome: Secondary | ICD-10-CM

## 2016-06-19 LAB — CUP PACEART INCLINIC DEVICE CHECK
Battery Remaining Longevity: 142 mo
Brady Statistic AS VP Percent: 0 %
Brady Statistic AS VS Percent: 15 %
Implantable Lead Implant Date: 20070820
Implantable Lead Implant Date: 20070820
Implantable Lead Location: 753860
Implantable Lead Model: 4092
Implantable Lead Model: 5076
Lead Channel Impedance Value: 481 Ohm
Lead Channel Pacing Threshold Amplitude: 1 V
Lead Channel Pacing Threshold Amplitude: 1 V
Lead Channel Pacing Threshold Pulse Width: 0.4 ms
Lead Channel Pacing Threshold Pulse Width: 0.4 ms
Lead Channel Pacing Threshold Pulse Width: 0.4 ms
Lead Channel Sensing Intrinsic Amplitude: 2.8 mV
Lead Channel Sensing Intrinsic Amplitude: 4 mV
Lead Channel Setting Pacing Amplitude: 2.5 V
MDC IDC LEAD LOCATION: 753859
MDC IDC MSMT BATTERY IMPEDANCE: 100 Ohm
MDC IDC MSMT BATTERY VOLTAGE: 2.79 V
MDC IDC MSMT LEADCHNL RA PACING THRESHOLD AMPLITUDE: 1 V
MDC IDC MSMT LEADCHNL RA PACING THRESHOLD PULSEWIDTH: 0.4 ms
MDC IDC MSMT LEADCHNL RV IMPEDANCE VALUE: 650 Ohm
MDC IDC MSMT LEADCHNL RV PACING THRESHOLD AMPLITUDE: 1 V
MDC IDC PG IMPLANT DT: 20170222
MDC IDC SESS DTM: 20180215173335
MDC IDC SET LEADCHNL RA PACING AMPLITUDE: 2 V
MDC IDC SET LEADCHNL RV PACING PULSEWIDTH: 0.4 ms
MDC IDC SET LEADCHNL RV SENSING SENSITIVITY: 1.4 mV
MDC IDC STAT BRADY AP VP PERCENT: 0 %
MDC IDC STAT BRADY AP VS PERCENT: 84 %

## 2016-06-19 NOTE — Patient Instructions (Signed)
Medication Instructions:  Your physician recommends that you continue on your current medications as directed. Please refer to the Current Medication list given to you today.   Labwork: None Ordered   Testing/Procedures: None Ordered   Follow-Up: Your physician wants you to follow-up in: 1 year with [name], PA.  You will receive a reminder letter in the mail two months in advance. If you don't receive a letter, please call our office to schedule the follow-up appointment.  Remote monitoring is used to monitor your Pacemaker from home. This monitoring reduces the number of office visits required to check your device to one time per year. It allows us to keep an eye on the functioning of your device to ensure it is working properly. You are scheduled for a device check from home on 09/18/16. You may send your transmission at any time that day. If you have a wireless device, the transmission will be sent automatically. After your physician reviews your transmission, you will receive a postcard with your next transmission date.    Any Other Special Instructions Will Be Listed Below (If Applicable).     If you need a refill on your cardiac medications before your next appointment, please call your pharmacy.

## 2016-06-19 NOTE — Progress Notes (Signed)
Patient Care Team: [name], MD as PCP - General (Family Medicine)   HPI  [name] [name] is a 71 y.o. male Seen in followup for pacemaker implanted for chronotropic incompetence and sinus node dysfunction by Dr. Erie Noe.  He underwent gen replacement 2/17  No problems with wound healing  The patient denies chest pain, shortness of breath, nocturnal dyspnea, orthopnea or peripheral edema.  There have been no palpitations, lightheadedness or syncope.    He has a history of diabetes hypertension and COPD. Myoview 9/14 was low risk.  Past Medical History:  Diagnosis Date  . Arthritis   . Bradycardia    pacemaker - Medtronic Adapta #[id number] - December 22, 2005  . Chronic back pain   . COPD (chronic obstructive pulmonary disease) (HCC)   . Depression   . Diabetes mellitus    type II controled with diet  . GERD (gastroesophageal reflux disease)   . Headache(784.0)   . Hyperlipidemia   . Hypertension   . Insomnia   . Neuromuscular disorder (HCC)    peripheral neuropathy BLE    Past Surgical History:  Procedure Laterality Date  . BACK SURGERY    . EP IMPLANTABLE DEVICE N/A 06/27/2015   Procedure:  PPM Generator Changeout;  Surgeon: Duke Salvia, MD;  Location: [facility] INVASIVE CV LAB;  Service: Cardiovascular;  Laterality: N/A;  . HEMORRHOID SURGERY    . INSERT / REPLACE / REMOVE PACEMAKER    . PACEMAKER INSERTION  ?2007  . TOTAL HIP ARTHROPLASTY Right 01/25/2013   Procedure: TOTAL HIP ARTHROPLASTY ANTERIOR APPROACH;  Surgeon: Velna Ochs, MD;  Location: [facility] OR;  Service: Orthopedics;  Laterality: Right;    Current Outpatient Prescriptions  Medication Sig Dispense Refill  . acetaminophen (TYLENOL) 650 MG CR tablet Take 650 mg by mouth every 8 (eight) hours as needed for pain.    Ailene Ards ELLIPTA 62.5-25 MCG/INH AEPB Inhale 1 puff into the lungs daily.     [name] aspirin 81 MG tablet Take 81 mg by mouth daily.    [name] atorvastatin (LIPITOR) 40 MG tablet Take 40 mg by mouth  daily.    [name] BENICAR 40 MG tablet Take 1 tablet by mouth daily.    [name] CALCIUM-MAGNESIUM-ZINC PO Take 1 capsule by mouth daily.    [name] docusate sodium (COLACE) 100 MG capsule Take 100 mg by mouth daily.    . famotidine (PEPCID) 20 MG tablet TAKE ONE TABLET BY MOUTH ONCE DAILY AT BEDTIME 30 tablet 0  . FLOVENT HFA 110 MCG/ACT inhaler INHALE TWO PUFFS BY MOUTH TWICE DAILY THEN RINSE MOUTH 1 Inhaler 4  . gabapentin (NEURONTIN) 300 MG capsule Take 300 mg by mouth 2 (two) times daily.    [name] glimepiride (AMARYL) 1 MG tablet Take 1 tablet by mouth daily.    [name] guaiFENesin (MUCINEX) 600 MG 12 hr tablet Take 600 mg by mouth 2 (two) times daily.    [name] ipratropium-albuterol (DUONEB) 0.5-2.5 (3) MG/3ML SOLN Inhale 3 mLs into the lungs every 6 (six) hours as needed (wheezing or shortness of breath).     [name] levocetirizine (XYZAL) 5 MG tablet Take 1 tablet by mouth daily.    . montelukast (SINGULAIR) 10 MG tablet Take 1 tablet by mouth at bedtime.     . Multiple Vitamin (MULTIVITAMIN WITH MINERALS) TABS tablet Take 1 tablet by mouth daily.    . pantoprazole (PROTONIX) 40 MG tablet TAKE ONE TABLET BY MOUTH ONCE DAILY TAKE  30-60  MINUTES  BEFORE  FIRST  MEAL  OF  THE  DAY 30 tablet 0  . PROAIR HFA 108 (90 BASE) MCG/ACT inhaler Inhale 2 puffs into the lungs 4 (four) times daily as needed.    [name]. Respiratory Therapy Supplies (FLUTTER) DEVI Blow through 4 times per set, 3 sets per day when needed to clear lungs 1 each 0   No current facility-administered medications for this visit.     Allergies  Allergen Reactions  . Penicillins Anaphylaxis  . Metoprolol Tartrate Other (See Comments)    HR drops too low  . Codeine Rash    Review of Systems negative except from HPI and PMH  Physical Exam BP 106/72   Pulse 84   Ht 6' (1.829 m)   Wt 210 lb 9.6 oz (95.5 kg)   SpO2 98%   BMI 28.56 kg/m  Well developed and well nourished in no acute distress HENT normal E scleral and icterus clear Neck Supple JVP flat;  carotids brisk and full Clear to ausculation  Regular rate and rhythm, +S4Soft with active bowel sounds No clubbing cyanosis  Edema Alert and oriented, grossly normal motor and sensory function Skin Warm and Dry    Assessment and  Plan  Sinus node dysfunction   Pacemaker Medtronic The patient's device was interrogated.  The information was reviewed. No changes were made in the programming.     Hypertension   Diabetes    Reprogrammed the rate response algorithm to decrease ADL rate and increase exetional slope  BP well controlled and in fact it may be too low given his diabetes based on the [employer] Trial. I've asked him to check his blood pressure biweekly and to take these data with him when he sees Dr. Tiburcio PeaHarris in July. At that point perhaps his Benicar dose should be down titrated. You're also to go was inadvertently says so

## 2016-09-18 ENCOUNTER — Telehealth: Payer: Self-pay | Admitting: Cardiology

## 2016-09-18 ENCOUNTER — Ambulatory Visit (INDEPENDENT_AMBULATORY_CARE_PROVIDER_SITE_OTHER): Payer: Medicare Other | Admitting: *Deleted

## 2016-09-18 DIAGNOSIS — I495 Sick sinus syndrome: Secondary | ICD-10-CM | POA: Diagnosis not present

## 2016-09-18 NOTE — Telephone Encounter (Signed)
Confirmed remote transmission w/ pt son.    

## 2016-09-19 ENCOUNTER — Encounter: Payer: Self-pay | Admitting: Cardiology

## 2016-09-19 NOTE — Progress Notes (Signed)
Remote pacemaker transmission.   

## 2016-09-23 LAB — CUP PACEART REMOTE DEVICE CHECK
Battery Impedance: 111 Ohm
Battery Remaining Longevity: 136 mo
Brady Statistic AS VS Percent: 17 %
Implantable Lead Implant Date: 20070820
Implantable Lead Model: 4092
Lead Channel Impedance Value: 591 Ohm
Lead Channel Setting Pacing Amplitude: 2 V
Lead Channel Setting Pacing Amplitude: 2.5 V
Lead Channel Setting Sensing Sensitivity: 1.4 mV
MDC IDC LEAD IMPLANT DT: 20070820
MDC IDC LEAD LOCATION: 753859
MDC IDC LEAD LOCATION: 753860
MDC IDC MSMT BATTERY VOLTAGE: 2.78 V
MDC IDC MSMT LEADCHNL RA IMPEDANCE VALUE: 436 Ohm
MDC IDC MSMT LEADCHNL RA PACING THRESHOLD AMPLITUDE: 0.75 V
MDC IDC MSMT LEADCHNL RA PACING THRESHOLD PULSEWIDTH: 0.4 ms
MDC IDC MSMT LEADCHNL RV PACING THRESHOLD AMPLITUDE: 1.125 V
MDC IDC MSMT LEADCHNL RV PACING THRESHOLD PULSEWIDTH: 0.4 ms
MDC IDC PG IMPLANT DT: 20170222
MDC IDC SESS DTM: 20180517184324
MDC IDC SET LEADCHNL RV PACING PULSEWIDTH: 0.4 ms
MDC IDC STAT BRADY AP VP PERCENT: 0 %
MDC IDC STAT BRADY AP VS PERCENT: 82 %
MDC IDC STAT BRADY AS VP PERCENT: 0 %

## 2016-10-03 ENCOUNTER — Encounter: Payer: Self-pay | Admitting: Cardiology

## 2016-10-29 ENCOUNTER — Other Ambulatory Visit: Payer: Self-pay | Admitting: Sports Medicine

## 2016-10-29 DIAGNOSIS — M5412 Radiculopathy, cervical region: Secondary | ICD-10-CM

## 2016-10-29 DIAGNOSIS — M542 Cervicalgia: Secondary | ICD-10-CM

## 2016-10-31 ENCOUNTER — Ambulatory Visit
Admission: RE | Admit: 2016-10-31 | Discharge: 2016-10-31 | Disposition: A | Discharge status: Home / Self Care | Payer: Medicare Other | Source: Ambulatory Visit | Attending: Sports Medicine | Admitting: Sports Medicine

## 2016-10-31 DIAGNOSIS — M5412 Radiculopathy, cervical region: Secondary | ICD-10-CM

## 2016-10-31 DIAGNOSIS — M542 Cervicalgia: Secondary | ICD-10-CM

## 2016-11-26 ENCOUNTER — Other Ambulatory Visit: Payer: Self-pay | Admitting: Internal Medicine

## 2016-12-15 ENCOUNTER — Telehealth: Payer: Self-pay | Admitting: Internal Medicine

## 2016-12-15 NOTE — Telephone Encounter (Signed)
Helped daughter send transmission at her home. Transmission sent successfully.

## 2016-12-15 NOTE — Telephone Encounter (Signed)
New message      Pt states that the "telephone company is not going to check his pacemaker anymore".  Not sure what he is talking about.  He still has his landline phone.  Please call

## 2016-12-18 ENCOUNTER — Ambulatory Visit (INDEPENDENT_AMBULATORY_CARE_PROVIDER_SITE_OTHER): Payer: Medicare Other | Admitting: *Deleted

## 2016-12-18 DIAGNOSIS — I495 Sick sinus syndrome: Secondary | ICD-10-CM

## 2016-12-18 DIAGNOSIS — R001 Bradycardia, unspecified: Secondary | ICD-10-CM

## 2016-12-19 NOTE — Progress Notes (Signed)
Remote pacemaker check. 

## 2016-12-25 LAB — CUP PACEART REMOTE DEVICE CHECK
Battery Impedance: 111 Ohm
Battery Voltage: 2.78 V
Brady Statistic AP VP Percent: 0 %
Brady Statistic AP VS Percent: 81 %
Brady Statistic AS VP Percent: 0 %
Implantable Lead Implant Date: 20070820
Implantable Lead Model: 4092
Implantable Lead Model: 5076
Lead Channel Impedance Value: 473 Ohm
Lead Channel Impedance Value: 589 Ohm
Lead Channel Pacing Threshold Amplitude: 0.875 V
Lead Channel Pacing Threshold Pulse Width: 0.4 ms
Lead Channel Setting Pacing Amplitude: 2.5 V
Lead Channel Setting Pacing Pulse Width: 0.4 ms
MDC IDC LEAD IMPLANT DT: 20070820
MDC IDC LEAD LOCATION: 753859
MDC IDC LEAD LOCATION: 753860
MDC IDC MSMT BATTERY REMAINING LONGEVITY: 138 mo
MDC IDC MSMT LEADCHNL RV PACING THRESHOLD AMPLITUDE: 1.125 V
MDC IDC MSMT LEADCHNL RV PACING THRESHOLD PULSEWIDTH: 0.4 ms
MDC IDC PG IMPLANT DT: 20170222
MDC IDC SESS DTM: 20180813172708
MDC IDC SET LEADCHNL RA PACING AMPLITUDE: 2 V
MDC IDC SET LEADCHNL RV SENSING SENSITIVITY: 1.4 mV
MDC IDC STAT BRADY AS VS PERCENT: 19 %

## 2017-01-02 ENCOUNTER — Encounter: Payer: Self-pay | Admitting: Cardiology

## 2017-01-16 ENCOUNTER — Encounter: Payer: Self-pay | Admitting: Cardiology

## 2017-03-18 ENCOUNTER — Other Ambulatory Visit: Payer: Self-pay | Admitting: Neurosurgery

## 2017-03-19 ENCOUNTER — Telehealth: Payer: Self-pay | Admitting: Cardiology

## 2017-03-19 ENCOUNTER — Ambulatory Visit (INDEPENDENT_AMBULATORY_CARE_PROVIDER_SITE_OTHER): Payer: Medicare Other | Admitting: *Deleted

## 2017-03-19 DIAGNOSIS — I495 Sick sinus syndrome: Secondary | ICD-10-CM | POA: Diagnosis not present

## 2017-03-19 NOTE — Telephone Encounter (Signed)
Spoke with pt and reminded pt of remote transmission that is due today. Pt verbalized understanding.   

## 2017-03-20 NOTE — Progress Notes (Signed)
Remote pacemaker transmission.   

## 2017-03-23 ENCOUNTER — Other Ambulatory Visit: Payer: Self-pay

## 2017-03-23 ENCOUNTER — Encounter (HOSPITAL_COMMUNITY): Payer: Self-pay | Admitting: *Deleted

## 2017-03-23 NOTE — Progress Notes (Addendum)
Pt denies any acute cardiopulmonary issues. Pt under the care of Dr. Graciela HusbandsKlein, Cardiology; awaiting Peri-op Prescription for ICD. Pt denies having a cardiac cath. Pt denies having a chest x ray within the last year. Pt denies recent labs. Pt made aware to stop taking Aspirin (per pt/ per MD), vitamins, fish oil and herbal medications. Do not take any NSAIDs ie: Ibuprofen, Advil, Naproxen (Aleve), Motrin, BC and Goody Powder or any medication containing Aspirin. Pt made aware to not take Glimepiride tonight or tomorrow morning. Pt made aware to check BG every 2 hours prior to arrival to hospital on DOS. Pt made aware to treat a BG < 70 with 4 ounces of apple or cranberry juice, wait 15 minutes after intervention to recheck BG, if BG remains < 70, call Short Stay unit to speak with a nurse. Pt verbalized understanding of all pre-op instructions. Anesthesia asked to review pt history.

## 2017-03-23 NOTE — Progress Notes (Signed)
Anesthesia Chart Review: SAME DAY WORK-UP.  Patient is a 71 year old male scheduled for right carpal tunnel release, right ulnar nerve release on 03/24/17 by Dr. Maeola HarmanJoseph Stern. Case is posted for general anesthesia.  History includes sinus node dysfunction s/p Medtronic dual chamber PPM 12/22/05 (generator change 06/27/15), HTN, HLD, COPD, former smoker (quit '75), GERD, DM2, peripheral neuropathy, depression, insomnia, arthritis, right THA 01/25/13, back surgery.   - PCP is listed as Dr. Johny BlamerWilliam Harris.   - Cardiologist is Dr. Sherryl MangesSteven Klein, last visit 06/19/16.  - Pulmonologist is Dr. Jetty Duhamellinton Young, last visit 02/11/16.  Meds include Anoro Ellipta, ASA 81 mg, Lipitor, Pepcid, Flovent, Neurontin, Amaryl, Mucinex, HCTZ, Duoneb, Xyzal, Singulair, Benicar, Protonix, ProAir. HFA   EKG 06/19/16: NSR, LAD, pulmonary disease pattern.  Echo 08/29/14: Study Conclusions - Left ventricle: The cavity size was normal. There was mild focal   basal hypertrophy of the septum. Systolic function was normal.   The estimated ejection fraction was in the range of 55% to 60%.   Wall motion was normal; there were no regional wall motion   abnormalities. Doppler parameters are consistent with abnormal   left ventricular relaxation (grade 1 diastolic dysfunction). - Left atrium: The atrium was mildly dilated. - Pulmonary arteries: Systolic pressure was mildly increased. PA   peak pressure: 34 mm Hg (S). Impressions: - Normal LV function; grade 1 diastolic dysfunction; mild LAE;   trace TR; mildly elevated pulmonary pressure.  Nuclear stress test 01/24/13:  Overall Impression:  Low risk stress nuclear study with a small, mild basal to mid inferior perfusion defect.  No ischemia.  Given normal wall motion, suspect diaphragmatic attenuation. . LV Ejection Fraction: 57.  LV Wall Motion:  NL LV Function; NL Wall Motion  PFTs 08/10/15: FVC 3.53 (73%), FEV1 2.98 (85%), DLCO unc 20.62 (57%), cor 23.93 (67%).  He will need  updated labs prior to surgery.   He is a same day work-up. He does have a Medtronic PPM (perioperative device form is pending). Last cardiology visit within the past year. Anesthesiologist to evaluate on the day of surgery. If no acute symptoms and labs acceptable then I would anticipate that he can proceed as planned.  Velna Ochsllison Kaytlynne Neace, PA-C [facility] Phone [phone] 03/23/2017 2:56 PM

## 2017-03-24 ENCOUNTER — Encounter (HOSPITAL_COMMUNITY)
Admission: RE | Disposition: A | Discharge status: Home / Self Care | Payer: Self-pay | Source: Ambulatory Visit | Attending: Neurosurgery

## 2017-03-24 ENCOUNTER — Ambulatory Visit (HOSPITAL_COMMUNITY): Payer: Medicare Other | Admitting: Vascular Surgery

## 2017-03-24 ENCOUNTER — Ambulatory Visit (HOSPITAL_COMMUNITY)
Admission: RE | Admit: 2017-03-24 | Discharge: 2017-03-24 | Disposition: A | Discharge status: Home / Self Care | Payer: Medicare Other | Source: Ambulatory Visit | Attending: Neurosurgery | Admitting: Neurosurgery

## 2017-03-24 ENCOUNTER — Encounter (HOSPITAL_COMMUNITY): Payer: Self-pay

## 2017-03-24 ENCOUNTER — Other Ambulatory Visit: Payer: Self-pay

## 2017-03-24 DIAGNOSIS — Z888 Allergy status to other drugs, medicaments and biological substances status: Secondary | ICD-10-CM | POA: Diagnosis not present

## 2017-03-24 DIAGNOSIS — G5623 Lesion of ulnar nerve, bilateral upper limbs: Secondary | ICD-10-CM | POA: Insufficient documentation

## 2017-03-24 DIAGNOSIS — Z950 Presence of cardiac pacemaker: Secondary | ICD-10-CM | POA: Diagnosis not present

## 2017-03-24 DIAGNOSIS — Z87891 Personal history of nicotine dependence: Secondary | ICD-10-CM | POA: Diagnosis not present

## 2017-03-24 DIAGNOSIS — G5620 Lesion of ulnar nerve, unspecified upper limb: Secondary | ICD-10-CM | POA: Diagnosis present

## 2017-03-24 DIAGNOSIS — Z7982 Long term (current) use of aspirin: Secondary | ICD-10-CM | POA: Diagnosis not present

## 2017-03-24 DIAGNOSIS — Z7984 Long term (current) use of oral hypoglycemic drugs: Secondary | ICD-10-CM | POA: Diagnosis not present

## 2017-03-24 DIAGNOSIS — G5603 Carpal tunnel syndrome, bilateral upper limbs: Secondary | ICD-10-CM | POA: Insufficient documentation

## 2017-03-24 DIAGNOSIS — K219 Gastro-esophageal reflux disease without esophagitis: Secondary | ICD-10-CM | POA: Insufficient documentation

## 2017-03-24 DIAGNOSIS — Z880 Allergy status to penicillin: Secondary | ICD-10-CM | POA: Insufficient documentation

## 2017-03-24 DIAGNOSIS — J449 Chronic obstructive pulmonary disease, unspecified: Secondary | ICD-10-CM | POA: Insufficient documentation

## 2017-03-24 DIAGNOSIS — Z885 Allergy status to narcotic agent status: Secondary | ICD-10-CM | POA: Insufficient documentation

## 2017-03-24 DIAGNOSIS — E1142 Type 2 diabetes mellitus with diabetic polyneuropathy: Secondary | ICD-10-CM | POA: Insufficient documentation

## 2017-03-24 DIAGNOSIS — Z79899 Other long term (current) drug therapy: Secondary | ICD-10-CM | POA: Insufficient documentation

## 2017-03-24 DIAGNOSIS — M4802 Spinal stenosis, cervical region: Secondary | ICD-10-CM | POA: Insufficient documentation

## 2017-03-24 DIAGNOSIS — I10 Essential (primary) hypertension: Secondary | ICD-10-CM | POA: Diagnosis not present

## 2017-03-24 HISTORY — PX: CARPAL TUNNEL RELEASE: SHX101

## 2017-03-24 HISTORY — DX: Presence of spectacles and contact lenses: Z97.3

## 2017-03-24 HISTORY — DX: Carpal tunnel syndrome, bilateral upper limbs: G56.03

## 2017-03-24 HISTORY — DX: Chronic kidney disease, unspecified: N18.9

## 2017-03-24 HISTORY — PX: ULNAR NERVE TRANSPOSITION: SHX2595

## 2017-03-24 HISTORY — DX: Presence of cardiac pacemaker: Z95.0

## 2017-03-24 LAB — BASIC METABOLIC PANEL
Anion gap: 10 (ref 5–15)
BUN: 22 mg/dL — AB (ref 6–20)
CHLORIDE: 106 mmol/L (ref 101–111)
CO2: 23 mmol/L (ref 22–32)
Calcium: 9 mg/dL (ref 8.9–10.3)
Creatinine, Ser: 1.38 mg/dL — ABNORMAL HIGH (ref 0.61–1.24)
GFR calc Af Amer: 57 mL/min — ABNORMAL LOW (ref >60)
GFR, EST NON AFRICAN AMERICAN: 49 mL/min — AB (ref >60)
Glucose, Bld: 128 mg/dL — ABNORMAL HIGH (ref 65–99)
Potassium: 4.8 mmol/L (ref 3.5–5.1)
SODIUM: 139 mmol/L (ref 135–145)

## 2017-03-24 LAB — GLUCOSE, CAPILLARY
GLUCOSE-CAPILLARY: 130 mg/dL — AB (ref 65–99)
Glucose-Capillary: 132 mg/dL — ABNORMAL HIGH (ref 65–99)

## 2017-03-24 LAB — CBC
HCT: 42.7 % (ref 39.0–52.0)
HEMOGLOBIN: 14.5 g/dL (ref 13.0–17.0)
MCH: 32 pg (ref 26.0–34.0)
MCHC: 34 g/dL (ref 30.0–36.0)
MCV: 94.3 fL (ref 78.0–100.0)
Platelets: 191 10*3/uL (ref 150–400)
RBC: 4.53 MIL/uL (ref 4.22–5.81)
RDW: 13.6 % (ref 11.5–15.5)
WBC: 8.3 10*3/uL (ref 4.0–10.5)

## 2017-03-24 LAB — HEMOGLOBIN A1C
HEMOGLOBIN A1C: 6.4 % — AB (ref 4.8–5.6)
MEAN PLASMA GLUCOSE: 136.98 mg/dL

## 2017-03-24 SURGERY — CARPAL TUNNEL RELEASE
Anesthesia: General | Site: Arm Upper | Laterality: Right

## 2017-03-24 MED ORDER — IPRATROPIUM-ALBUTEROL 0.5-2.5 (3) MG/3ML IN SOLN: 3.0000 mL | Freq: Four times a day (QID) | RESPIRATORY_TRACT | Status: DC | PRN

## 2017-03-24 MED ORDER — DOCUSATE SODIUM 100 MG PO CAPS: 100.0000 mg | ORAL_CAPSULE | Freq: Two times a day (BID) | ORAL | Status: DC

## 2017-03-24 MED ORDER — CHLORHEXIDINE GLUCONATE CLOTH 2 % EX PADS: 6.0000 | MEDICATED_PAD | Freq: Once | CUTANEOUS | Status: DC

## 2017-03-24 MED ORDER — LIDOCAINE 2% (20 MG/ML) 5 ML SYRINGE
INTRAMUSCULAR | Status: AC
  Filled 2017-03-24: qty 5

## 2017-03-24 MED ORDER — BACITRACIN ZINC 500 UNIT/GM EX OINT
TOPICAL_OINTMENT | CUTANEOUS | Status: AC
  Filled 2017-03-24: qty 28.35

## 2017-03-24 MED ORDER — PROPOFOL 10 MG/ML IV BOLUS
INTRAVENOUS | Status: DC | PRN
  Administered 2017-03-24: 170 mg via INTRAVENOUS

## 2017-03-24 MED ORDER — HYDROCHLOROTHIAZIDE 25 MG PO TABS: 12.5000 mg | ORAL_TABLET | Freq: Every day | ORAL | Status: DC

## 2017-03-24 MED ORDER — METHOCARBAMOL 500 MG PO TABS: 500.0000 mg | ORAL_TABLET | Freq: Four times a day (QID) | ORAL | Status: DC | PRN

## 2017-03-24 MED ORDER — HYDROCODONE-ACETAMINOPHEN 5-325 MG PO TABS: 2.0000 | ORAL_TABLET | ORAL | Status: DC | PRN

## 2017-03-24 MED ORDER — UMECLIDINIUM-VILANTEROL 62.5-25 MCG/INH IN AEPB
1.0000 | INHALATION_SPRAY | Freq: Every day | RESPIRATORY_TRACT | Status: DC
  Filled 2017-03-24: qty 14

## 2017-03-24 MED ORDER — FENTANYL CITRATE (PF) 100 MCG/2ML IJ SOLN
INTRAMUSCULAR | Status: DC | PRN
  Administered 2017-03-24: 100 ug via INTRAVENOUS

## 2017-03-24 MED ORDER — MENTHOL 3 MG MT LOZG: 1.0000 | LOZENGE | OROMUCOSAL | Status: DC | PRN

## 2017-03-24 MED ORDER — GABAPENTIN 300 MG PO CAPS: 300.0000 mg | ORAL_CAPSULE | Freq: Two times a day (BID) | ORAL | Status: DC

## 2017-03-24 MED ORDER — BUDESONIDE 0.25 MG/2ML IN SUSP: 0.2500 mg | Freq: Two times a day (BID) | RESPIRATORY_TRACT | Status: DC

## 2017-03-24 MED ORDER — SODIUM CHLORIDE 0.9% FLUSH: 3.0000 mL | Freq: Two times a day (BID) | INTRAVENOUS | Status: DC

## 2017-03-24 MED ORDER — ONDANSETRON HCL 4 MG/2ML IJ SOLN
INTRAMUSCULAR | Status: AC
  Filled 2017-03-24: qty 2

## 2017-03-24 MED ORDER — ACETAMINOPHEN ER 650 MG PO TBCR: 1300.0000 mg | EXTENDED_RELEASE_TABLET | Freq: Every day | ORAL | Status: DC | PRN

## 2017-03-24 MED ORDER — PANTOPRAZOLE SODIUM 40 MG PO TBEC: 40.0000 mg | DELAYED_RELEASE_TABLET | Freq: Two times a day (BID) | ORAL | Status: DC

## 2017-03-24 MED ORDER — METHOCARBAMOL 1000 MG/10ML IJ SOLN
500.0000 mg | Freq: Four times a day (QID) | INTRAMUSCULAR | Status: DC | PRN
  Filled 2017-03-24: qty 5

## 2017-03-24 MED ORDER — ADULT MULTIVITAMIN W/MINERALS CH: 1.0000 | ORAL_TABLET | Freq: Every day | ORAL | Status: DC

## 2017-03-24 MED ORDER — DEXAMETHASONE SODIUM PHOSPHATE 4 MG/ML IJ SOLN
INTRAMUSCULAR | Status: DC | PRN
  Administered 2017-03-24: 10 mg via INTRAVENOUS

## 2017-03-24 MED ORDER — 0.9 % SODIUM CHLORIDE (POUR BTL) OPTIME
TOPICAL | Status: DC | PRN
  Administered 2017-03-24: 1000 mL

## 2017-03-24 MED ORDER — MIDAZOLAM HCL 2 MG/2ML IJ SOLN
INTRAMUSCULAR | Status: AC
  Filled 2017-03-24: qty 2

## 2017-03-24 MED ORDER — FLUTTER DEVI: 4.0000 | Freq: Three times a day (TID) | Status: DC | PRN

## 2017-03-24 MED ORDER — CALCIUM-MAGNESIUM-ZINC 333-133-5 MG PO TABS: ORAL_TABLET | Freq: Every day | ORAL | Status: DC

## 2017-03-24 MED ORDER — LORATADINE 10 MG PO TABS: 5.0000 mg | ORAL_TABLET | Freq: Every day | ORAL | Status: DC

## 2017-03-24 MED ORDER — SODIUM CHLORIDE 0.9% FLUSH: 3.0000 mL | INTRAVENOUS | Status: DC | PRN

## 2017-03-24 MED ORDER — MONTELUKAST SODIUM 10 MG PO TABS: 10.0000 mg | ORAL_TABLET | Freq: Every day | ORAL | Status: DC

## 2017-03-24 MED ORDER — ROCURONIUM BROMIDE 10 MG/ML (PF) SYRINGE
PREFILLED_SYRINGE | INTRAVENOUS | Status: AC
  Filled 2017-03-24: qty 5

## 2017-03-24 MED ORDER — FENTANYL CITRATE (PF) 100 MCG/2ML IJ SOLN: 25.0000 ug | INTRAMUSCULAR | Status: DC | PRN

## 2017-03-24 MED ORDER — ZOLPIDEM TARTRATE 5 MG PO TABS: 5.0000 mg | ORAL_TABLET | Freq: Every evening | ORAL | Status: DC | PRN

## 2017-03-24 MED ORDER — MORPHINE SULFATE (PF) 4 MG/ML IV SOLN: 2.0000 mg | INTRAVENOUS | Status: DC | PRN

## 2017-03-24 MED ORDER — GUAIFENESIN ER 600 MG PO TB12
600.0000 mg | ORAL_TABLET | Freq: Two times a day (BID) | ORAL | Status: DC
  Filled 2017-03-24: qty 1

## 2017-03-24 MED ORDER — ONDANSETRON HCL 4 MG/2ML IJ SOLN
INTRAMUSCULAR | Status: DC | PRN
  Administered 2017-03-24: 4 mg via INTRAVENOUS

## 2017-03-24 MED ORDER — ACETAMINOPHEN 650 MG RE SUPP: 650.0000 mg | RECTAL | Status: DC | PRN

## 2017-03-24 MED ORDER — ASPIRIN 81 MG PO CHEW: 81.0000 mg | CHEWABLE_TABLET | Freq: Every day | ORAL | Status: DC

## 2017-03-24 MED ORDER — OXYCODONE HCL 5 MG PO TABS: 5.0000 mg | ORAL_TABLET | ORAL | Status: DC | PRN

## 2017-03-24 MED ORDER — PHENYLEPHRINE HCL 10 MG/ML IJ SOLN
INTRAMUSCULAR | Status: DC | PRN
  Administered 2017-03-24 (×2): 80 ug via INTRAVENOUS

## 2017-03-24 MED ORDER — ATORVASTATIN CALCIUM 20 MG PO TABS: 40.0000 mg | ORAL_TABLET | Freq: Every day | ORAL | Status: DC

## 2017-03-24 MED ORDER — LIDOCAINE 2% (20 MG/ML) 5 ML SYRINGE
INTRAMUSCULAR | Status: DC | PRN
  Administered 2017-03-24: 40 mg via INTRAVENOUS

## 2017-03-24 MED ORDER — ACETAMINOPHEN 325 MG PO TABS: 650.0000 mg | ORAL_TABLET | ORAL | Status: DC | PRN

## 2017-03-24 MED ORDER — SODIUM CHLORIDE 0.9 % IV SOLN: 250.0000 mL | INTRAVENOUS | Status: DC

## 2017-03-24 MED ORDER — LIDOCAINE HCL (PF) 1 % IJ SOLN
INTRAMUSCULAR | Status: DC | PRN
  Administered 2017-03-24: 15 mL

## 2017-03-24 MED ORDER — BACITRACIN ZINC 500 UNIT/GM EX OINT: TOPICAL_OINTMENT | CUTANEOUS | Status: DC | PRN

## 2017-03-24 MED ORDER — KCL IN DEXTROSE-NACL 20-5-0.45 MEQ/L-%-% IV SOLN
INTRAVENOUS | Status: AC
  Filled 2017-03-24: qty 1000

## 2017-03-24 MED ORDER — ALUM & MAG HYDROXIDE-SIMETH 200-200-20 MG/5ML PO SUSP: 30.0000 mL | Freq: Four times a day (QID) | ORAL | Status: DC | PRN

## 2017-03-24 MED ORDER — LIDOCAINE HCL (PF) 1 % IJ SOLN
INTRAMUSCULAR | Status: AC
  Filled 2017-03-24: qty 30

## 2017-03-24 MED ORDER — VANCOMYCIN HCL 10 G IV SOLR
1500.0000 mg | Freq: Once | INTRAVENOUS | Status: DC
  Filled 2017-03-24: qty 1500

## 2017-03-24 MED ORDER — LACTATED RINGERS IV SOLN
INTRAVENOUS | Status: DC
  Administered 2017-03-24: 09:00:00 via INTRAVENOUS

## 2017-03-24 MED ORDER — PHENOL 1.4 % MT LIQD: 1.0000 | OROMUCOSAL | Status: DC | PRN

## 2017-03-24 MED ORDER — GLIMEPIRIDE 2 MG PO TABS: 1.0000 mg | ORAL_TABLET | Freq: Every day | ORAL | Status: DC

## 2017-03-24 MED ORDER — PROPOFOL 10 MG/ML IV BOLUS
INTRAVENOUS | Status: AC
  Filled 2017-03-24: qty 20

## 2017-03-24 MED ORDER — LACTATED RINGERS IV SOLN
INTRAVENOUS | Status: DC | PRN
  Administered 2017-03-24: 09:00:00 via INTRAVENOUS

## 2017-03-24 MED ORDER — ALBUTEROL SULFATE (2.5 MG/3ML) 0.083% IN NEBU: 3.0000 mL | INHALATION_SOLUTION | Freq: Four times a day (QID) | RESPIRATORY_TRACT | Status: DC | PRN

## 2017-03-24 MED ORDER — PHENYLEPHRINE HCL 10 MG/ML IJ SOLN
INTRAVENOUS | Status: DC | PRN
  Administered 2017-03-24: 20 ug/min via INTRAVENOUS

## 2017-03-24 MED ORDER — IRBESARTAN 75 MG PO TABS
37.5000 mg | ORAL_TABLET | Freq: Every day | ORAL | Status: DC
  Filled 2017-03-24: qty 0.5

## 2017-03-24 MED ORDER — ONDANSETRON HCL 4 MG PO TABS: 4.0000 mg | ORAL_TABLET | Freq: Four times a day (QID) | ORAL | Status: DC | PRN

## 2017-03-24 MED ORDER — FAMOTIDINE 20 MG PO TABS: 20.0000 mg | ORAL_TABLET | Freq: Every day | ORAL | Status: DC

## 2017-03-24 MED ORDER — DEXAMETHASONE SODIUM PHOSPHATE 10 MG/ML IJ SOLN
INTRAMUSCULAR | Status: AC
  Filled 2017-03-24: qty 1

## 2017-03-24 MED ORDER — VANCOMYCIN HCL IN DEXTROSE 1-5 GM/200ML-% IV SOLN
1000.0000 mg | INTRAVENOUS | Status: AC
  Administered 2017-03-24: 1000 mg via INTRAVENOUS
  Filled 2017-03-24: qty 200

## 2017-03-24 MED ORDER — BISACODYL 10 MG RE SUPP: 10.0000 mg | Freq: Every day | RECTAL | Status: DC | PRN

## 2017-03-24 MED ORDER — PANTOPRAZOLE SODIUM 40 MG IV SOLR: 40.0000 mg | Freq: Every day | INTRAVENOUS | Status: DC

## 2017-03-24 MED ORDER — ONDANSETRON HCL 4 MG/2ML IJ SOLN: 4.0000 mg | Freq: Four times a day (QID) | INTRAMUSCULAR | Status: DC | PRN

## 2017-03-24 MED ORDER — SENNOSIDES-DOCUSATE SODIUM 8.6-50 MG PO TABS: 1.0000 | ORAL_TABLET | Freq: Every evening | ORAL | Status: DC | PRN

## 2017-03-24 MED ORDER — MIDAZOLAM HCL 5 MG/5ML IJ SOLN
INTRAMUSCULAR | Status: DC | PRN
  Administered 2017-03-24: 1 mg via INTRAVENOUS

## 2017-03-24 MED ORDER — FLEET ENEMA 7-19 GM/118ML RE ENEM: 1.0000 | ENEMA | Freq: Once | RECTAL | Status: DC | PRN

## 2017-03-24 MED ORDER — ONDANSETRON HCL 4 MG/2ML IJ SOLN: 4.0000 mg | Freq: Once | INTRAMUSCULAR | Status: DC | PRN

## 2017-03-24 MED ORDER — KCL IN DEXTROSE-NACL 20-5-0.45 MEQ/L-%-% IV SOLN: INTRAVENOUS | Status: DC

## 2017-03-24 MED ORDER — FENTANYL CITRATE (PF) 250 MCG/5ML IJ SOLN
INTRAMUSCULAR | Status: AC
  Filled 2017-03-24: qty 5

## 2017-03-24 SURGICAL SUPPLY — 71 items
ADH SKN CLS APL DERMABOND .7 (GAUZE/BANDAGES/DRESSINGS) ×2
BANDAGE GAUZE 4  KLING STR (GAUZE/BANDAGES/DRESSINGS) ×4 IMPLANT
BLADE SURG 10 STRL SS (BLADE) ×3 IMPLANT
BLADE SURG 15 STRL LF DISP TIS (BLADE) ×4 IMPLANT
BLADE SURG 15 STRL SS (BLADE) ×6
BNDG CMPR 75X41 PLY HI ABS (GAUZE/BANDAGES/DRESSINGS) ×2
BNDG GAUZE ELAST 4 BULKY (GAUZE/BANDAGES/DRESSINGS) ×8 IMPLANT
BNDG STRETCH 4X75 STRL LF (GAUZE/BANDAGES/DRESSINGS) ×1 IMPLANT
BRUSH SCRUB EZ PLAIN DRY (MISCELLANEOUS) ×1 IMPLANT
CABLE BIPOLOR RESECTION CORD (MISCELLANEOUS) ×5 IMPLANT
CANISTER SUCT 3000ML PPV (MISCELLANEOUS) ×3 IMPLANT
CARTRIDGE OIL MAESTRO DRILL (MISCELLANEOUS) ×2 IMPLANT
DECANTER SPIKE VIAL GLASS SM (MISCELLANEOUS) ×5 IMPLANT
DERMABOND ADVANCED (GAUZE/BANDAGES/DRESSINGS) ×1
DERMABOND ADVANCED .7 DNX12 (GAUZE/BANDAGES/DRESSINGS) IMPLANT
DIFFUSER DRILL AIR PNEUMATIC (MISCELLANEOUS) ×2 IMPLANT
DRAPE EXTREMITY T 121X128X90 (DRAPE) ×5 IMPLANT
DRAPE HALF SHEET 40X57 (DRAPES) ×9 IMPLANT
DRESSING TELFA 8X10 (GAUZE/BANDAGES/DRESSINGS) ×1 IMPLANT
DRSG EMULSION OIL 3X3 NADH (GAUZE/BANDAGES/DRESSINGS) ×3 IMPLANT
DURAPREP 26ML APPLICATOR (WOUND CARE) ×1 IMPLANT
ELECT BLADE 4.0 EZ CLEAN MEGAD (MISCELLANEOUS)
ELECT REM PT RETURN 9FT ADLT (ELECTROSURGICAL) ×3
ELECTRODE BLDE 4.0 EZ CLN MEGD (MISCELLANEOUS) IMPLANT
ELECTRODE REM PT RTRN 9FT ADLT (ELECTROSURGICAL) IMPLANT
GAUZE SPONGE 4X4 12PLY STRL (GAUZE/BANDAGES/DRESSINGS) ×5 IMPLANT
GAUZE SPONGE 4X4 12PLY STRL LF (GAUZE/BANDAGES/DRESSINGS) ×3 IMPLANT
GAUZE SPONGE 4X4 16PLY XRAY LF (GAUZE/BANDAGES/DRESSINGS) ×7 IMPLANT
GLOVE BIO SURGEON STRL SZ8 (GLOVE) ×5 IMPLANT
GLOVE BIOGEL PI IND STRL 7.0 (GLOVE) IMPLANT
GLOVE BIOGEL PI IND STRL 7.5 (GLOVE) IMPLANT
GLOVE BIOGEL PI IND STRL 8 (GLOVE) IMPLANT
GLOVE BIOGEL PI IND STRL 8.5 (GLOVE) ×4 IMPLANT
GLOVE BIOGEL PI INDICATOR 7.0 (GLOVE) ×2
GLOVE BIOGEL PI INDICATOR 7.5 (GLOVE) ×2
GLOVE BIOGEL PI INDICATOR 8 (GLOVE) ×1
GLOVE BIOGEL PI INDICATOR 8.5 (GLOVE) ×1
GLOVE ECLIPSE 8.0 STRL XLNG CF (GLOVE) ×1 IMPLANT
GLOVE EXAM NITRILE LRG STRL (GLOVE) IMPLANT
GLOVE EXAM NITRILE XL STR (GLOVE) IMPLANT
GLOVE EXAM NITRILE XS STR PU (GLOVE) IMPLANT
GOWN STRL REUS W/ TWL LRG LVL3 (GOWN DISPOSABLE) ×2 IMPLANT
GOWN STRL REUS W/ TWL XL LVL3 (GOWN DISPOSABLE) IMPLANT
GOWN STRL REUS W/TWL 2XL LVL3 (GOWN DISPOSABLE) ×1 IMPLANT
GOWN STRL REUS W/TWL LRG LVL3 (GOWN DISPOSABLE) ×3
GOWN STRL REUS W/TWL XL LVL3 (GOWN DISPOSABLE) ×3
KIT BASIN OR (CUSTOM PROCEDURE TRAY) ×5 IMPLANT
KIT ROOM TURNOVER OR (KITS) ×5 IMPLANT
LOCATOR NERVE 3 VOLT (DISPOSABLE) IMPLANT
LOOP VESSEL MAXI BLUE (MISCELLANEOUS) ×3 IMPLANT
NDL HYPO 25X1 1.5 SAFETY (NEEDLE) ×4 IMPLANT
NEEDLE HYPO 25X1 1.5 SAFETY (NEEDLE) ×3 IMPLANT
NS IRRIG 1000ML POUR BTL (IV SOLUTION) ×5 IMPLANT
OIL CARTRIDGE MAESTRO DRILL (MISCELLANEOUS)
PACK SURGICAL SETUP 50X90 (CUSTOM PROCEDURE TRAY) ×5 IMPLANT
PAD ARMBOARD 7.5X6 YLW CONV (MISCELLANEOUS) ×14 IMPLANT
PENCIL BUTTON HOLSTER BLD 10FT (ELECTRODE) ×1 IMPLANT
STOCKINETTE 4X48 STRL (DRAPES) ×4 IMPLANT
STOCKINETTE 6  STRL (DRAPES) ×1
STOCKINETTE 6 STRL (DRAPES) IMPLANT
SUT ETHILON 3 0 PS 1 (SUTURE) ×4 IMPLANT
SUT SILK 2 0 PERMA HAND 18 BK (SUTURE) IMPLANT
SUT VIC AB 2-0 CP2 18 (SUTURE) ×3 IMPLANT
SUT VIC AB 3-0 SH 8-18 (SUTURE) ×3 IMPLANT
SYR BULB 3OZ (MISCELLANEOUS) ×5 IMPLANT
SYR CONTROL 10ML LL (SYRINGE) ×5 IMPLANT
TOWEL GREEN STERILE (TOWEL DISPOSABLE) ×5 IMPLANT
TOWEL GREEN STERILE FF (TOWEL DISPOSABLE) ×5 IMPLANT
TUBE CONNECTING 12X1/4 (SUCTIONS) ×3 IMPLANT
UNDERPAD 30X30 (UNDERPADS AND DIAPERS) ×5 IMPLANT
WATER STERILE IRR 1000ML POUR (IV SOLUTION) ×5 IMPLANT

## 2017-03-24 NOTE — Progress Notes (Signed)
Awake, alert, conversant.  MAEW with less numbness in right arm and hand.  Doing well.

## 2017-03-24 NOTE — Discharge Summary (Signed)
Physician Discharge Summary  Patient ID: [name] ColaceMarion C Cimino MRN: [id number] DOB/AGE: [birth year] 71 y.o.  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  He did well with surgery and was discharged home the same day.  Consults: None  Significant Diagnostic Studies: None  Treatments: surgery: Patient underwent decompression of right ulnar nerve at elbow and right carpal tunnel at wrist  Discharge Exam: Blood pressure (!) 153/88, pulse 63, temperature (!) 97 F (36.1 C), resp. rate 16, height 6' (1.829 m), weight 93 kg (205 lb), SpO2 100 %. Neurologic: Alert and oriented X 3, normal strength and tone. Normal symmetric reflexes. Normal coordination and gait Wound: CDI  Disposition: Home  Discharge Instructions    Diet - low sodium heart healthy   Complete by:  As directed    Increase activity slowly   Complete by:  As directed      Allergies as of 03/24/2017      Reactions   Penicillins Anaphylaxis   Has patient had a PCN reaction causing immediate rash, facial/tongue/throat swelling, SOB or lightheadedness with hypotension: Yes Has patient had a PCN reaction causing severe rash involving mucus membranes or skin necrosis: No Has patient had a PCN reaction that required hospitalization: Yes Has patient had a PCN reaction occurring within the last 10 years: No If all of the above answers are "NO", then may proceed with Cephalosporin use.   Lopressor [metoprolol Tartrate]    " It drops my heart rate."   Codeine Rash      Medication List    TAKE these medications   acetaminophen 650 MG CR tablet Commonly known as:  TYLENOL Take 1,300 mg daily as needed by mouth for pain.    ANORO ELLIPTA 62.5-25 MCG/INH Aepb Generic drug:  umeclidinium-vilanterol Inhale 1 puff into the lungs daily.   aspirin 81 MG tablet Take 81 mg by mouth daily.   atorvastatin 40 MG tablet Commonly known as:  LIPITOR Take 40 mg by mouth daily.   CALCIUM-MAGNESIUM-ZINC PO Take 1 capsule by mouth daily.   famotidine 20 MG tablet Commonly known as:  PEPCID TAKE ONE TABLET BY MOUTH ONCE DAILY AT BEDTIME   FLOVENT HFA 110 MCG/ACT inhaler Generic drug:  fluticasone INHALE TWO PUFFS BY MOUTH TWICE DAILY THEN RINSE MOUTH   FLUTTER Devi Blow through 4 times per set, 3 sets per day when needed to clear lungs   gabapentin 300 MG capsule Commonly known as:  NEURONTIN Take 300 mg by mouth 2 (two) times daily.   glimepiride 1 MG tablet Commonly known as:  AMARYL Take 1 tablet by mouth daily.   guaiFENesin 600 MG 12 hr tablet Commonly known as:  MUCINEX Take 600 mg by mouth 2 (two) times daily.   hydrochlorothiazide 12.5 MG tablet Commonly known as:  HYDRODIURIL Take 12.5 mg daily by mouth.   ipratropium-albuterol 0.5-2.5 (3) MG/3ML Soln Commonly known as:  DUONEB Inhale 3 mLs into the lungs every 6 (six) hours as needed (wheezing or shortness of breath).   levocetirizine 5 MG tablet Commonly known as:  XYZAL Take 5 mg at bedtime by mouth.   montelukast 10 MG tablet Commonly known as:  SINGULAIR Take 10 mg at bedtime by mouth.   multivitamin with minerals Tabs  tablet Take 1 tablet by mouth daily.   olmesartan 20 MG tablet Commonly known as:  BENICAR Take 20 mg daily by mouth.   pantoprazole 40 MG tablet Commonly known as:  PROTONIX TAKE ONE TABLET BY MOUTH ONCE DAILY TAKE  30-60  MINUTES  BEFORE  FIRST  MEAL  OF  THE  DAY   PROAIR HFA 108 (90 Base) MCG/ACT inhaler Generic drug:  albuterol Inhale 2 puffs into the lungs 4 (four) times daily as needed.        Signed: Dorian HeckleSTERN,Jurney Overacker D, MD 03/24/2017, 3:06 PM

## 2017-03-24 NOTE — Transfer of Care (Signed)
Immediate Anesthesia Transfer of Care Note  Patient: [name] [name]  Procedure(s) Performed: Right CARPAL TUNNEL RELEASE (Right Arm Lower) Right Ulnar nerve release (Right Arm Upper)  Patient Location: PACU  Anesthesia Type:General  Level of Consciousness: sedated and drowsy  Airway & Oxygen Therapy: Patient Spontanous Breathing and Patient connected to face mask oxygen  Post-op Assessment: Report given to RN and Post -op Vital signs reviewed and stable  Post vital signs: Reviewed and stable  Last Vitals:  Vitals:   03/24/17 0832  BP: (!) 160/89  Pulse: 88  Resp: 18  Temp: 36.6 C  SpO2: 100%    Last Pain:  Vitals:   03/24/17 0832  TempSrc: Oral      Patients Stated Pain Goal: 3 (03/24/17 0843)  Complications: No apparent anesthesia complications

## 2017-03-24 NOTE — Anesthesia Postprocedure Evaluation (Signed)
Anesthesia Post Note  Patient: Erick ColaceMarion C Carrillo  Procedure(s) Performed: Right CARPAL TUNNEL RELEASE (Right Arm Lower) Right Ulnar nerve release (Right Arm Upper)     Patient location during evaluation: PACU Anesthesia Type: General Level of consciousness: awake, awake and alert and oriented Pain management: pain level controlled Vital Signs Assessment: post-procedure vital signs reviewed and stable Respiratory status: spontaneous breathing, nonlabored ventilation and respiratory function stable Cardiovascular status: blood pressure returned to baseline Postop Assessment: no headache Anesthetic complications: no    Last Vitals:  Vitals:   03/24/17 1145 03/24/17 1210  BP: (!) 147/84 (!) 153/88  Pulse: (!) 59 63  Resp: 14 16  Temp: 36.5 C (!) 36.1 C  SpO2: 100% 100%    Last Pain:  Vitals:   03/24/17 1100  TempSrc:   PainSc: Asleep                 Vika Buske COKER

## 2017-03-24 NOTE — Interval H&P Note (Signed)
History and Physical Interval Note:  03/24/2017 8:35 AM  [name] ColaceMarion C Tyndall  has presented today for surgery, with the diagnosis of Bilateral carpal tunnel syndrome;Ulnar neuropathy of both upper extremities  The various methods of treatment have been discussed with the patient and family. After consideration of risks, benefits and other options for treatment, the patient has consented to  Procedure(s) with comments: Right CARPAL TUNNEL RELEASE (Right) - Right CARPAL TUNNEL RELEASE Right Ulnar nerve release (Right) - Right Ulnar nerve release as a surgical intervention .  The patient's history has been reviewed, patient examined, no change in status, stable for surgery.  I have reviewed the patient's chart and labs.  Questions were answered to the patient's satisfaction.     Mitchel Delduca D

## 2017-03-24 NOTE — Anesthesia Procedure Notes (Addendum)
Procedure Name: LMA Insertion Date/Time: 03/24/2017 9:31 AM Performed by: Julian ReilWelty, Ples Trudel F, CRNA Pre-anesthesia Checklist: Patient identified, Emergency Drugs available, Suction available, Patient being monitored and Timeout performed Patient Re-evaluated:Patient Re-evaluated prior to induction Oxygen Delivery Method: Circle system utilized Preoxygenation: Pre-oxygenation with 100% oxygen Induction Type: IV induction Ventilation: Two handed mask ventilation required LMA: LMA inserted LMA Size: 4.0 Tube type: Oral Number of attempts: 1 Placement Confirmation: positive ETCO2,  CO2 detector and breath sounds checked- equal and bilateral Tube secured with: Tape Comments: LMA placement per Armida SansMeredith Smith, SRNA

## 2017-03-24 NOTE — Progress Notes (Signed)
Pt doing well. Pt and daughter given D/C instructions with verbal understanding was provided. Pt's incision is clean and dry with no sign of infection. Pt's IV was removed prior to D/C. Pt D/C'd home via wheelchair @ 1545 per MD order. Pt is stable @ D/C and has no other needs at this time. Rema FendtAshley Manuel Lawhead, RN

## 2017-03-24 NOTE — H&P (Signed)
Patient ID:   [id number] Patient: Jaidin Richison  Date of Birth: 1945/08/16 Visit Type: Office Visit   Date: 03/16/2017 11:45 AM Provider: Danae Orleans. Venetia Maxon MD   This 71 year old male presents for neck pain.   History of Present Illness: 1.  neck pain  Patient returns to review his EMG/NCS. He notes continued arm and hand numbness. He notes occasional neck pain, worse in the morning.  EMG 02/10/17: Severe bilateral median neuropathy at the wrist (carpal tunnel syndrome).  In addition to sensory and motor demyelination there is evidence of sensory axonal loss bilaterally. 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  On the right, in addition to motor demyelination there does appear to be sensory and motor axonal loss.  On the left, primarily motor demyelination but also some sensory axonal loss.  There was no evidence for concomitant ulnar neuropathy at [place]. Superimposed on the above is evidence of peripheral polyneuropathy, primarily sensory axonal loss in nature.  This is based on the diminished radial sensory amplitudes and likely consistent with diabetic etiology.           MEDICATIONS(added, continued or stopped this visit): Started Medication Directions Instruction Stopped   albuterol sulfate 1.25 mg/3 mL solution for nebulization inhale 6 milliliter by inhalation route 3- 4 times every day via nebulizer     Anoro Ellipta 62.5 mcg-25 mcg/actuation powder for inhalation inhale 1 puff by inhalation route  every day at the same time each day     Aspir-81 81 mg tablet,delayed release take 1 tablet by oral route  every day     atorvastatin 40 mg tablet take 1 tablet by oral route  every day     Benicar 20 mg tablet take 1 tablet by oral route  every day     famotidine 20 mg tablet take 1 tablet by oral route  every day     Flovent HFA 220 mcg/actuation aerosol inhaler inhale 1 puff  by inhalation route 2 times every day     gabapentin 300 mg capsule take 1 capsule by oral route 3 times every day     glimepiride 1 mg tablet take 1 tablet by oral route  every day     hydrochlorothiazide 12.5 mg tablet take 1 tablet by oral route  every day     ipratropium-albuterol 0.5 mg-3 mg(2.5 mg base)/3 mL nebulization soln inhale 3 milliliter by nebulization route 4 times every day     multivitamin tablet take 1 tablet by oral route  every day     pantoprazole 40 mg tablet,delayed release take 1 tablet by oral route  every day     ProAir HFA 90 mcg/actuation aerosol inhaler inhale 2 puff by inhalation route  every 4 - 6 hours as needed     Singulair 10 mg tablet take 1 tablet by oral route  every day in the evening     Stool Softener 100 mg capsule take 1 capsule by oral route  every day at bedtime as needed     Tylenol Arthritis Pain 650 mg tablet,extended release take 1 tablet by oral route  every 6 hours as needed swallowing whole with water. Do not break, crush, dissolve and/or chew.     Xyzal 5 mg tablet take 1 tablet by oral route  every day in the evening       ALLERGIES: Ingredient Reaction Medication Name Comment  NO  KNOWN ALLERGIES     No known allergies. Reviewed, no changes.    Vitals Date Temp F BP Pulse Ht In Wt Lb BMI BSA Pain Score  03/16/2017  148/86 68 72 205.4 27.86  8/10      IMPRESSION Patient returns to review his EMG. EMG shows severe carpal tunnel bilaterally, ulnar nerve compression, and peripheral neuropathy likely from diabetes. On confrontational testing, weakness in right hand intrinsics. Schedule right carpal tunnel release and right ulnar nerve decompression.   Assessment/Plan # Detail Type Description   1. Assessment Bilateral carpal tunnel syndrome (G56.03).       2. 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           Pain Management Plan Pain Scale: 8/10. Method: Numeric Pain Intensity Scale. Location:  neck. Onset: 07/21/2016. Duration: varies. Quality: discomforting. Pain management follow-up plan of care: Patient does positional changes for relief.  Schedule right carpal tunnel release and right ulnar nerve decompression. Possible left side carpal tunnel release and ulnar nerve decompression after right sided surgery depending on recovery and persistence of left sided symptoms. Nurse education given.             Provider:  Venetia Maxon MD, Danae Orleans 03/16/2017 1:14 PM  Dictation edited by: Lajoyce Lauber    CC Providers: [facility] [address] [place],  [place]  [address]-   Pati Gallo  [address] [place], [place] [address]-              Electronically signed by Danae Orleans Venetia Maxon MD on 03/18/2017 08:43 AM  Patient ID:   [id number] Patient: Laurann Montana  Date of Birth: [birth year] Visit Type: Office Visit   Date: 02/10/2017 12:00 PM Provider: Lillia Dallas. Bartko MD    History of Present Illness: 1.  pain         MEDICATIONS(added, continued or stopped this visit): Started Medication Directions Instruction Stopped   albuterol sulfate 1.25 mg/3 mL solution for nebulization inhale 6 milliliter by inhalation route 3- 4 times every day via nebulizer     Anoro Ellipta 62.5 mcg-25 mcg/actuation powder for inhalation inhale 1 puff by inhalation route  every day at the same time each day     Aspir-81 81 mg tablet,delayed release take 1 tablet by oral route  every day     atorvastatin 40 mg tablet take 1 tablet by oral route  every day     Benicar 20 mg tablet take 1 tablet by oral route  every day     famotidine 20 mg tablet take 1 tablet by oral route  every day     Flovent HFA 220 mcg/actuation aerosol inhaler inhale 1 puff by inhalation route 2 times every day     gabapentin 300 mg capsule take 1 capsule by oral route 3 times every day     glimepiride 1 mg tablet take 1 tablet by oral route  every day      hydrochlorothiazide 12.5 mg tablet take 1 tablet by oral route  every day     ipratropium-albuterol 0.5 mg-3 mg(2.5 mg base)/3 mL nebulization soln inhale 3 milliliter by nebulization route 4 times every day     multivitamin tablet take 1 tablet by oral route  every day     pantoprazole 40 mg tablet,delayed release take 1 tablet by oral route  every day     ProAir HFA 90 mcg/actuation aerosol inhaler inhale 2  puff by inhalation route  every 4 - 6 hours as needed     Singulair 10 mg tablet take 1 tablet by oral route  every day in the evening     Stool Softener 100 mg capsule take 1 capsule by oral route  every day at bedtime as needed     Tylenol Arthritis Pain 650 mg tablet,extended release take 1 tablet by oral route  every 6 hours as needed swallowing whole with water. Do not break, crush, dissolve and/or chew.     Xyzal 5 mg tablet take 1 tablet by oral route  every day in the evening       ALLERGIES: Ingredient Reaction Medication Name Comment  NO KNOWN ALLERGIES     No known allergies.      PROCEDURES Electrodiagnostic Testing Electrodiagnostic testing of the bilateral upper extremities was performed today. Requesting provider is Dr. Venetia Maxon. Full electrodiagnostic testing report has been scanned into the patient's electronic medical record.   ELECTRODIAGNOSTIC LABORATORY REPORT:  Reason for Study:  Study requested of < upper extremities > to rule out peripheral neurologic process.  Pertinent History & Exam: < patient has been noting some numbness and tingling in digits 1 through 3 bilaterally most prominent with driving.  Also on the right however he has been noting numbness and tingling in digits 4 and 5.  He does have a history of diabetes and it sounds like he has been given a diagnosis of polyneuropathy and he describes numbness and tingling in a stocking distribution in his feet.  He also has some chronic neck pain.  CT scan indicated some biforaminal stenosis at C6-7 and on  the left at C4-5.  On his exam today, diminished sensation in digits 4 and 5 on the right.  Strength grossly intact.  No obvious Tinel's over the ulnar nerve.  >  Summary of Electrodiagnostic Findings (please see the accompanying data sheets/lab report): < nerve conduction study of upper extremities bilaterally revealed prolonged median sensory responses to digit 1 of low amplitude bilaterally, prolonged end of low amplitude to digit 3 on the right, absent on the left.  Normal radial sensory latencies bilaterally but with diminished amplitude.  Normal ulnar sensory latencies bilaterally but with diminished amplitudes.  Prolonged median motor distal latencies bilaterally with normal forearm NCV.  Normal ulnar motor distal latency and forearm NCV bilaterally.  There was slowing isolated to the across elbow segment with some diminished amplitude bilaterally.  There was diffuse diminished amplitudes additionally on the right.  Normal ulnar motor distal latencies to the F DI.  Comprehensive EMG of selected upper extremity muscles and associated shoulder girdle musculature and cervical paraspinals bilaterally revealed some chronic neurogenic motor unit changes and motor unit drop out confined to the ulnar hand intrinsics only.  >  IMPRESSION:  Comprehensive electrodiagnostic study of upper extremities bilaterally revealed the following:  1.  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  In addition to sensory and motor demyelination there is evidence of sensory axonal loss bilaterally.  2.  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  On the right, in addition to motor demyelination there does appear to be sensory and motor axonal loss.  On the left, primarily motor demyelination but also some sensory axonal loss.  There was no evidence for concomitant ulnar neuropathy at [place].  3.  Superimposed on the above is evidence of  peripheral polyneuropathy, primarily sensory axonal loss in nature.  This is based on the diminished radial sensory amplitudes and likely consistent with diabetic etiology.  Of  < >  Thank you for this referral.  Please call should any questions arise.   Assessment/Plan # Detail Type Description   1. 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       2. Assessment Bilateral carpal tunnel syndrome (G56.03).       3. 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                       Provider:  Murray HodgkinsBartko MD, Lillia Dallaslbert K 02/10/2017 12:53 PM       Electronically signed by Lillia DallasAlbert K. Murray HodgkinsBartko MD on 02/10/2017 12:53 PM  Patient ID:   [id number] Patient: Laurann MontanaMarion Newmann  Date of Birth: 1945-05-14 Visit Type: Office Visit   Date: 01/21/2017 02:30 PM Provider: Danae OrleansJoseph D. Venetia MaxonStern MD   This [age] year old male presents for neck pain.   History of Present Illness: 1.  neck pain  Laurann MontanaMarion Shain, [age] year old retired male, visits for evaluation of neck pain.  Patient recalls no injury, stating symptoms x6 months.  He also notes numbness in the 4th and 5th digit of the right hand for at least 6 months.  He grades the neck pain as 2-3/10. He grades the hand numbness as 1-2/10. Oral steroids and Flexeril provided initial relief of neck pain.  History:NIDDM, HTN, COPD, GERD, cholesterol, pacemaker Surgical history:  Right THR 2015, right THR revision 2016, pacemaker 2007, Harrington rods lumbar fusion years ago  CT and x-rays on [place]           MEDICATIONS(added, continued or stopped this visit):   ALLERGIES:   Review of Systems System Neg/Pos Details  Constitutional Negative Chills, Fatigue, Fever, Malaise, Night sweats, Weight gain and Weight loss.  ENMT Negative Ear drainage, Hearing loss, Nasal drainage, Otalgia, Sinus pressure and Sore throat.  Eyes Negative Eye discharge, Eye pain and Vision  changes.  Respiratory Negative Chronic cough, Cough, Dyspnea, Known TB exposure and Wheezing.  Cardio Negative Chest pain, Claudication, Edema and Irregular heartbeat/palpitations.  GI Negative Abdominal pain, Blood in stool, Change in stool pattern, Constipation, Decreased appetite, Diarrhea, Heartburn, Nausea and Vomiting.  GU Negative Dribbling, Dysuria, Erectile dysfunction, Hematuria, Polyuria (Genitourinary), Slow stream, Urinary frequency, Urinary incontinence and Urinary retention.  Endocrine Negative Cold intolerance, Heat intolerance, Polydipsia and Polyphagia.  Neuro Negative Dizziness, Extremity weakness, Gait disturbance, Headache, Memory impairment, Numbness in extremity, Seizures and Tremors.  Psych Negative Anxiety, Depression and Insomnia.  Integumentary Negative Brittle hair, Brittle nails, Change in shape/size of mole(s), Hair loss, Hirsutism, Hives, Pruritus, Rash and Skin lesion.  MS Positive Neck pain.  Hema/Lymph Negative Easy bleeding, Easy bruising and Lymphadenopathy.  Allergic/Immuno Negative Contact allergy, Environmental allergies, Food allergies and Seasonal allergies.  Reproductive Negative Penile discharge and Sexual dysfunction.   Vitals Date Temp F BP Pulse Ht In Wt Lb BMI BSA Pain Score  01/21/2017  144/83 65 72 204.8 27.78  2/10     PHYSICAL EXAM General Level of Distress: no acute distress Overall Appearance: normal  Head and Face  Right Left  Fundoscopic Exam:  normal normal    Cardiovascular Cardiac: regular rate and rhythm without murmur  Right Left  Carotid Pulses: normal normal  Respiratory Lungs: clear to auscultation  Neurological Orientation: normal Recent and Remote Memory: normal Attention Span and Concentration:   normal Language: normal Fund of Knowledge: normal  Right  Left Sensation: normal normal Upper Extremity Coordination: normal normal  Lower Extremity Coordination: normal normal  Musculoskeletal Gait and  Station: normal  Right Left Upper Extremity Muscle Strength: normal normal Lower Extremity Muscle Strength: normal normal Upper Extremity Muscle Tone:  normal normal Lower Extremity Muscle Tone: normal normal   Motor Strength Upper and lower extremity motor strength was tested in the clinically pertinent muscles. Right finger extensors 4/5 and hand intrinsics 4/5.    Deep Tendon Reflexes  Right Left Biceps: normal normal Triceps: normal normal Brachioradialis: normal normal Patellar: normal normal Achilles: normal normal  Cranial Nerves II. Optic Nerve/Visual Fields: normal III. Oculomotor: normal IV. Trochlear: normal V. Trigeminal: normal VI. Abducens: normal VII. Facial: normal VIII. Acoustic/Vestibular: normal IX. Glossopharyngeal: normal X. Vagus: normal XI. Spinal Accessory: normal XII. Hypoglossal: normal  Motor and other Tests Lhermittes: negative Rhomberg: negative Pronator drift: absent     Right Left Spurlings negative negative Hoffman's: normal normal Clonus: normal normal Babinski: normal normal Tinels Elbow: positive negative Tinels Wrist: positive negative   Additional Findings:  Splitting of the ring finger on the right, decreased sensation 1-3 digit both hands, numbness in 4th and 5th on the right. Right hand intrinsics are weak. Positive Tinel's right wrist. Negative Tinel's on the left wrist. Negative Tinel's at bilateral wrists. Negative Spurling's bilaterally. Upper extremity strength is otherwise full. Negative shoulder impingement. Full strength in lower extremities.   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    IMPRESSION Patient presents with right neck pain and numbness in the right 4th and 5th digits. On confrontational testing, splitting of the ring finger on the right, decreased  sensation 1-3 digit both hands, numbness in 4th and 5th on the right, right hand intrinsics are weak, positive Tinel's on the right wrist and elbow. CT shows degenerative changes and spurring throughout the cervical spine. Schedule nerve conduction test of bilateral upper extremities. Prescribe physical therapy.  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     Pain Management Plan Pain Scale: 2/10. Method: Numeric Pain Intensity Scale. Location: neck. Onset: 07/21/2016. Duration: varies. Quality: discomforting. Pain management follow-up plan of care: Patient is taking OTC pain relievers for relief..  Schedule nerve conduction test of bilateral upper extremities. Prescribe physical therapy for the neck. Follow-up after nerve conduction test.              Provider:  Venetia MaxonStern MD, Danae OrleansJoseph D 01/21/2017 3:37 PM  Dictation edited by: Lajoyce LauberLisa Hoyt    CC Providers: Jame[facility] RipaWilliam  [facility] [address] [place] ,  [place]  [id number]-   Pati GalloJames Kramer  [address] [place], [place] [id number]-              Electronically signed by Danae OrleansJoseph D. Venetia MaxonStern MD on 01/21/2017 05:02 PM

## 2017-03-24 NOTE — Op Note (Signed)
03/24/2017  10:51 AM  PATIENT:  [name] [name]  71 y.o. male  PRE-OPERATIVE DIAGNOSIS:  Bilateral carpal tunnel syndrome;Ulnar neuropathy of both upper extremities  POST-OPERATIVE DIAGNOSIS:  Bilateral carpal tunnel syndrome;Ulnar neuropathy of both elbows  PROCEDURE:  Procedure(s) with comments: Right CARPAL TUNNEL RELEASE (Right) - Right CARPAL TUNNEL RELEASE Right Ulnar nerve release (Right) - Right Ulnar nerve release  SURGEON:  Surgeon(s) and Role:    Maeola Harman* Brittie Whisnant, MD - Primary  PHYSICIAN ASSISTANT:   ASSISTANTS: none   ANESTHESIA:   general  EBL:  Minimal  BLOOD ADMINISTERED:none  DRAINS: none   LOCAL MEDICATIONS USED:  LIDOCAINE   SPECIMEN:  No Specimen  DISPOSITION OF SPECIMEN:  N/A  COUNTS:  YES  TOURNIQUET: None  DICTATION: Patient was then repositioned with his right arm on the arm board.  His hand and arm were prepped and draped with betadine, Duraprep and sterile stockinet. The right medial epicondyle was identified and a curvilinear incision was marked, infiltrated and made at this level.  It was carried to the fascia overlying  The epicondyle and the ulnar nerve was identified.  This was thoroughly decompressed and a thickened fascial band was cut and the nerve was thoroughly decompressed from the intermuscular septum to the flexor carpi ulnaris.  The nerve did not sublux with ranging of the elbow.  The wound was irrigated, then closed with 2-0 and 3-0 vicryl suture.  Right wrist was infiltrated with lidocaine and an incision was made over a length of 2 cm in line with the fourth ray.  The flexor retinaculum was incised and the carpal tunnel was decompressed.  Decompression was carried into the volar wrist and into the distal palm.  Hemostasis was assured. The wound was irrigated and closed with 3-0 nylon vertical mattress stitches and dressed with a sterile occlusive dressing with Adaptic at the wrist, Telfa at the elbow, fluff gauze, Kerlix and cling wrap  at both the wrist and elbow. Patient was extubated and taken to recovery in stable and satisfactory condition. Counts were correct at the end of the case.   PLAN OF CARE: Admit for overnight observation  PATIENT DISPOSITION:  PACU - hemodynamically stable.   Delay start of Pharmacological VTE agent (>24hrs) due to surgical blood loss or risk of bleeding: yes

## 2017-03-24 NOTE — Brief Op Note (Signed)
03/24/2017  10:51 AM  PATIENT:  [name] [name]  71 y.o. male  PRE-OPERATIVE DIAGNOSIS:  Bilateral carpal tunnel syndrome;Ulnar neuropathy of both upper extremities  POST-OPERATIVE DIAGNOSIS:  Bilateral carpal tunnel syndrome;Ulnar neuropathy of both elbows  PROCEDURE:  Procedure(s) with comments: Right CARPAL TUNNEL RELEASE (Right) - Right CARPAL TUNNEL RELEASE Right Ulnar nerve release (Right) - Right Ulnar nerve release  SURGEON:  Surgeon(s) and Role:    * Cayetano Mikita, MD - Primary  PHYSICIAN ASSISTANT:   ASSISTANTS: none   ANESTHESIA:   general  EBL:  Minimal  BLOOD ADMINISTERED:none  DRAINS: none   LOCAL MEDICATIONS USED:  LIDOCAINE   SPECIMEN:  No Specimen  DISPOSITION OF SPECIMEN:  N/A  COUNTS:  YES  TOURNIQUET: None  DICTATION: Patient was then repositioned with his right arm on the arm board.  His hand and arm were prepped and draped with betadine, Duraprep and sterile stockinet. The right medial epicondyle was identified and a curvilinear incision was marked, infiltrated and made at this level.  It was carried to the fascia overlying  The epicondyle and the ulnar nerve was identified.  This was thoroughly decompressed and a thickened fascial band was cut and the nerve was thoroughly decompressed from the intermuscular septum to the flexor carpi ulnaris.  The nerve did not sublux with ranging of the elbow.  The wound was irrigated, then closed with 2-0 and 3-0 vicryl suture.  Right wrist was infiltrated with lidocaine and an incision was made over a length of 2 cm in line with the fourth ray.  The flexor retinaculum was incised and the carpal tunnel was decompressed.  Decompression was carried into the volar wrist and into the distal palm.  Hemostasis was assured. The wound was irrigated and closed with 3-0 nylon vertical mattress stitches and dressed with a sterile occlusive dressing with Adaptic at the wrist, Telfa at the elbow, fluff gauze, Kerlix and cling wrap  at both the wrist and elbow. Patient was extubated and taken to recovery in stable and satisfactory condition. Counts were correct at the end of the case.   PLAN OF CARE: Admit for overnight observation  PATIENT DISPOSITION:  PACU - hemodynamically stable.   Delay start of Pharmacological VTE agent (>24hrs) due to surgical blood loss or risk of bleeding: yes   

## 2017-03-24 NOTE — Anesthesia Preprocedure Evaluation (Signed)
Anesthesia Evaluation    Airway Mallampati: II  TM Distance: >3 FB Neck ROM: Full    Dental  (+) Edentulous Upper, Edentulous Lower   Pulmonary former smoker,    breath sounds clear to auscultation       Cardiovascular hypertension,  Rhythm:Regular Rate:Normal     Neuro/Psych    GI/Hepatic   Endo/Other  diabetes  Renal/GU      Musculoskeletal   Abdominal   Peds  Hematology   Anesthesia Other Findings   Reproductive/Obstetrics                             Anesthesia Physical Anesthesia Plan  ASA: III  Anesthesia Plan: General   Post-op Pain Management:    Induction: Intravenous  PONV Risk Score and Plan: Ondansetron and Metaclopromide  Airway Management Planned: LMA  Additional Equipment:   Intra-op Plan:   Post-operative Plan:   Informed Consent: I have reviewed the patients History and Physical, chart, labs and discussed the procedure including the risks, benefits and alternatives for the proposed anesthesia with the patient or authorized representative who has indicated his/her understanding and acceptance.     Plan Discussed with: CRNA and Anesthesiologist  Anesthesia Plan Comments:         Anesthesia Quick Evaluation

## 2017-03-25 ENCOUNTER — Encounter (HOSPITAL_COMMUNITY): Payer: Self-pay | Admitting: Neurosurgery

## 2017-03-25 ENCOUNTER — Encounter: Payer: Self-pay | Admitting: Cardiology

## 2017-04-03 LAB — CUP PACEART REMOTE DEVICE CHECK
Battery Impedance: 135 Ohm
Battery Remaining Longevity: 131 mo
Battery Voltage: 2.78 V
Date Time Interrogation Session: 20181115223344
Implantable Lead Implant Date: 20070820
Implantable Lead Location: 753859
Implantable Lead Model: 4092
Implantable Lead Model: 5076
Lead Channel Pacing Threshold Pulse Width: 0.4 ms
Lead Channel Setting Pacing Amplitude: 2 V
Lead Channel Setting Pacing Amplitude: 2.5 V
Lead Channel Setting Pacing Pulse Width: 0.4 ms
Lead Channel Setting Sensing Sensitivity: 1.4 mV
MDC IDC LEAD IMPLANT DT: 20070820
MDC IDC LEAD LOCATION: 753860
MDC IDC MSMT LEADCHNL RA IMPEDANCE VALUE: 461 Ohm
MDC IDC MSMT LEADCHNL RA PACING THRESHOLD AMPLITUDE: 0.75 V
MDC IDC MSMT LEADCHNL RA PACING THRESHOLD PULSEWIDTH: 0.4 ms
MDC IDC MSMT LEADCHNL RV IMPEDANCE VALUE: 603 Ohm
MDC IDC MSMT LEADCHNL RV PACING THRESHOLD AMPLITUDE: 1.25 V
MDC IDC PG IMPLANT DT: 20170222
MDC IDC STAT BRADY AP VP PERCENT: 0 %
MDC IDC STAT BRADY AP VS PERCENT: 81 %
MDC IDC STAT BRADY AS VP PERCENT: 0 %
MDC IDC STAT BRADY AS VS PERCENT: 18 %

## 2017-05-11 ENCOUNTER — Other Ambulatory Visit: Payer: Self-pay | Admitting: Internal Medicine

## 2017-05-20 ENCOUNTER — Telehealth: Payer: Self-pay | Admitting: Internal Medicine

## 2017-05-20 ENCOUNTER — Other Ambulatory Visit: Payer: Self-pay | Admitting: Internal Medicine

## 2017-05-20 MED ORDER — FLUTICASONE PROPIONATE HFA 110 MCG/ACT IN AERO: INHALATION_SPRAY | RESPIRATORY_TRACT | 12 refills | Status: DC

## 2017-05-20 NOTE — Telephone Encounter (Signed)
Called pt letting him know we were going to take care of sending an Rx of the flovent to his preferred pharmacy. Pt expressed understanding. Nothing further needed.

## 2017-05-22 ENCOUNTER — Other Ambulatory Visit: Payer: Self-pay | Admitting: Family Medicine

## 2017-05-22 ENCOUNTER — Telehealth: Payer: Self-pay | Admitting: Physician Assistant

## 2017-05-22 DIAGNOSIS — I739 Peripheral vascular disease, unspecified: Secondary | ICD-10-CM

## 2017-05-22 NOTE — Telephone Encounter (Signed)
New message  Patient sent transmission today. Does patient still need to send transmission 2/14. Please call  1. Has your device fired? NO  2. Is you device beeping? NO  3. Are you experiencing draining or swelling at device site? NO  4. Are you calling to see if we received your device transmission? YES  5. Have you passed out? NO    Please route to Device Clinic Pool

## 2017-05-22 NOTE — Telephone Encounter (Signed)
Informed patient that remote was received, but that he would need to send another one on 2/14 as scheduled. Patient verbalized understanding.

## 2017-05-26 ENCOUNTER — Other Ambulatory Visit: Payer: Self-pay | Admitting: Family Medicine

## 2017-05-26 ENCOUNTER — Ambulatory Visit
Admission: RE | Admit: 2017-05-26 | Discharge: 2017-05-26 | Disposition: A | Discharge status: Home / Self Care | Payer: Medicare Other | Source: Ambulatory Visit | Attending: Family Medicine | Admitting: Family Medicine

## 2017-05-26 DIAGNOSIS — I739 Peripheral vascular disease, unspecified: Secondary | ICD-10-CM

## 2017-05-27 ENCOUNTER — Ambulatory Visit (INDEPENDENT_AMBULATORY_CARE_PROVIDER_SITE_OTHER): Payer: Medicare Other | Admitting: Internal Medicine

## 2017-05-27 ENCOUNTER — Encounter: Payer: Self-pay | Admitting: Internal Medicine

## 2017-05-27 ENCOUNTER — Ambulatory Visit (INDEPENDENT_AMBULATORY_CARE_PROVIDER_SITE_OTHER)
Admission: RE | Admit: 2017-05-27 | Discharge: 2017-05-27 | Disposition: A | Discharge status: Home / Self Care | Payer: Medicare Other | Source: Ambulatory Visit | Attending: Internal Medicine | Admitting: Internal Medicine

## 2017-05-27 VITALS — BP 116/78 | HR 66 | Ht 72.0 in | Wt 210.8 lb

## 2017-05-27 DIAGNOSIS — R0609 Other forms of dyspnea: Secondary | ICD-10-CM

## 2017-05-27 DIAGNOSIS — J449 Chronic obstructive pulmonary disease, unspecified: Secondary | ICD-10-CM | POA: Diagnosis not present

## 2017-05-27 DIAGNOSIS — R05 Cough: Secondary | ICD-10-CM | POA: Diagnosis not present

## 2017-05-27 DIAGNOSIS — R058 Other specified cough: Secondary | ICD-10-CM

## 2017-05-27 DIAGNOSIS — R0600 Dyspnea, unspecified: Secondary | ICD-10-CM

## 2017-05-27 MED ORDER — AZITHROMYCIN 250 MG PO TABS: ORAL_TABLET | ORAL | 0 refills | Status: DC

## 2017-05-27 NOTE — Progress Notes (Signed)
HPI  M former smoker with COPD, chronic cough  complicated by Cardiac dysrhythmia/ pacemaker, HBP  Office Spirometry 08/11/14-moderate obstruction/restriction-FVC 2.95/62%, FEV1 2.37/65%, ratio 0.79, FEF 25-75% 2.43/77%  -----------------------------------------------------------------------------------------   02/11/2016-72 year old male former smoker followed for COPD, restrictive lung disease, cough, complicated by cardiac arrhythmia/pacemaker, HBP, DM FOLLOWS FOR pt has cough about a week ago mucus stuck in throat. mucus was yellow. Shortness of breath with activity. Wants something instead of mucinex (too costly).  Anoro, ProAir, Neb/ Duoneb, Singulair, gabapentin, Xyzal  Intermittent feeling "like a briar" sensation in his throat at level of the larynx 2 weeks. Taking daily Mucinex but finds that too expensive. Nebulizer and sips of liquids help. No solid food hanging up. No change in breathing otherwise.  05/27/17- 72 year old male former smoker followed for COPD, restrictive lung disease, cough, complicated by cardiac arrhythmia/pacemaker, HBP, DM ------Pt has productive cough- yellow mucus, post nasal drip, sore throat, and some SOB with exertion. Pt states been feeling like this for 2-3 weeks. Intervals of sinus pressure with postnasal drip and throat tickle cough.  Not much headache or fever. Complains of dyspnea on exertion trying to walk 1/2 mile on treadmill.  Exercises with machines regularly at "Y".  Not wheezing. UTD flu and pvax  ROS-see HPI   + = positive Constitutional:    weight loss, night sweats, fevers, chills, fatigue, lassitude. HEENT:    headaches, difficulty swallowing, tooth/dental problems, sore throat,       sneezing, itching, ear ache, +nasal congestion, +post nasal drip, snoring CV:    chest pain, orthopnea, PND, swelling in lower extremities, anasarca,                                                           dizziness, palpitations Resp:   +shortness of breath  with exertion or at rest.                productive cough,   + non-productive cough, coughing up of blood.              change in color of mucus.  wheezing.   Skin:    rash or lesions. GI:  No-   heartburn, indigestion, abdominal pain, nausea, vomiting, GU:  MS:   joint pain, stiffness,  Neuro-     nothing unusual Psych:  change in mood or affect.  depression or anxiety.   memory loss.  OBJ- Physical Exam General- Alert, Oriented, Affect-appropriate, Distress- none acute Skin- rash-none, lesions- none, excoriation- none Lymphadenopathy- none Head- atraumatic            Eyes- Gross vision intact, PERRLA, conjunctivae and secretions clear            Ears- Hearing, canals-normal            Nose- Clear, no-Septal dev, mucus, polyps, erosion, perforation             Throat- Mallampati II , mucosa clear , drainage+, tonsils- atrophic, edentulous+, not hoarse Neck- flexible , trachea midline, no stridor , thyroid nl, carotid no bruit Chest - symmetrical excursion , unlabored           Heart/CV- RRR , no murmur , no gallop  , no rub, nl s1 s2                           -  JVD- none , edema- none, stasis changes- none, varices- none           Lung- clear to P&A, wheeze-none, cough-none, dullness-none, rub- none, 98% saturation at rest on room air           Chest wall- +L pacemaker Abd-  Br/ Gen/ Rectal- Not done, not indicated Extrem- cyanosis- none, clubbing, none, atrophy- none, strength- nl, +cane Neuro- grossly intact to observation

## 2017-05-27 NOTE — Patient Instructions (Addendum)
Script sent for Zpak antibiotic to sle if we can clear some sinus infection/ bronchitis  Order- CXR    Dx chronic cough, dyspnea on exertion  Order- schedule PFT     Dx dyspnea on exertion  Please call if we can help

## 2017-05-28 NOTE — Assessment & Plan Note (Signed)
I think cough is reflecting postnasal drip and he may have residual sinus infection. Plan-Z-Pak and discussion of antihistamines.

## 2017-05-28 NOTE — Assessment & Plan Note (Signed)
Chest is clear.  We need to better define pulmonary status. Plan-schedule PFT, CXR

## 2017-06-18 ENCOUNTER — Ambulatory Visit (INDEPENDENT_AMBULATORY_CARE_PROVIDER_SITE_OTHER): Payer: Medicare Other | Admitting: Pulmonary Disease

## 2017-06-18 ENCOUNTER — Telehealth: Payer: Self-pay | Admitting: Cardiology

## 2017-06-18 ENCOUNTER — Ambulatory Visit (INDEPENDENT_AMBULATORY_CARE_PROVIDER_SITE_OTHER): Payer: Medicare Other | Admitting: *Deleted

## 2017-06-18 ENCOUNTER — Ambulatory Visit (INDEPENDENT_AMBULATORY_CARE_PROVIDER_SITE_OTHER)
Admission: RE | Admit: 2017-06-18 | Discharge: 2017-06-18 | Disposition: A | Discharge status: Home / Self Care | Payer: Medicare Other | Source: Ambulatory Visit | Attending: Pulmonary Disease | Admitting: Pulmonary Disease

## 2017-06-18 ENCOUNTER — Encounter: Payer: Self-pay | Admitting: Pulmonary Disease

## 2017-06-18 VITALS — BP 126/74 | HR 73 | Temp 100.1°F | Ht 72.0 in | Wt 213.0 lb

## 2017-06-18 DIAGNOSIS — I495 Sick sinus syndrome: Secondary | ICD-10-CM | POA: Diagnosis not present

## 2017-06-18 DIAGNOSIS — J189 Pneumonia, unspecified organism: Secondary | ICD-10-CM | POA: Diagnosis not present

## 2017-06-18 DIAGNOSIS — J219 Acute bronchiolitis, unspecified: Secondary | ICD-10-CM | POA: Diagnosis not present

## 2017-06-18 DIAGNOSIS — J449 Chronic obstructive pulmonary disease, unspecified: Secondary | ICD-10-CM

## 2017-06-18 MED ORDER — HYDROCODONE-HOMATROPINE 5-1.5 MG/5ML PO SYRP: 5.0000 mL | ORAL_SOLUTION | Freq: Two times a day (BID) | ORAL | 0 refills | Status: DC | PRN

## 2017-06-18 MED ORDER — LEVOFLOXACIN 500 MG PO TABS: 500.0000 mg | ORAL_TABLET | Freq: Every day | ORAL | 0 refills | Status: DC

## 2017-06-18 NOTE — Telephone Encounter (Signed)
Spoke with pt and reminded pt of remote transmission that is due today. Pt verbalized understanding.   

## 2017-06-18 NOTE — Progress Notes (Signed)
   Subjective:    Patient ID: Erick ColaceMarion C Cirrincione, male    DOB: July 01, 1945, 72 y.o.   MRN: [id number]  HPI  72 year old male former smoker followed for COPD, restrictive lung disease, cough Is a hypertensive, diabetic and has a pacemaker.  He comes in for an acute visit today, body aches for 3 days and low-grade fever for 2 days, mostly dry cough with severe paroxysms to the point of causing chest pains.  He also reports a sore throat.  No sick contacts   Flu test was negative. Chest x-ray did not show any infiltrates or effusions  Past Medical History:  Diagnosis Date  . Arthritis   . Bradycardia    pacemaker - Medtronic Adapta #ADDRO1 - December 22, 2005  . Carpal tunnel syndrome on both sides   . Carpal tunnel syndrome, bilateral   . Chronic back pain   . Chronic kidney disease   . COPD (chronic obstructive pulmonary disease) (HCC)   . Depression   . Diabetes mellitus    type II controled with diet  . GERD (gastroesophageal reflux disease)   . Headache(784.0)   . Hyperlipidemia   . Hypertension   . Insomnia   . Neuromuscular disorder (HCC)    peripheral neuropathy BLE  . Pneumonia   . Presence of permanent cardiac pacemaker   . Wears glasses      Review of Systems neg for any significant  dysphagia, itching, sneezing, nasal congestion or excess/ purulent secretions, fever, chills, sweats, unintended wt loss, pleuritic or exertional cp, hempoptysis, orthopnea pnd or change in chronic leg swelling.  Also denies presyncope, palpitations, heartburn, abdominal pain, nausea, vomiting, diarrhea or change in bowel or urinary habits, dysuria,hematuria, rash, arthralgias, visual complaints, headache, numbness weakness or ataxia.     Objective:   Physical Exam   Gen. Pleasant, well-nourished, in no distress ENT - no thrush, no post nasal drip Neck: No JVD, no thyromegaly, no carotid bruits Lungs: no use of accessory muscles, no dullness to percussion, left basal rales no rhonchi   Cardiovascular: Rhythm regular, heart sounds  normal, no murmurs or gallops, no peripheral edema Musculoskeletal: No deformities, no cyanosis or clubbing         Assessment & Plan:

## 2017-06-18 NOTE — Assessment & Plan Note (Signed)
Flu test is negative.  Levaquin 500 mg daily for 7 days Plenty of oral fluids. Delsym 5 mL twice daily over-the-counter as needed for cough, if no better call us back for prescription cough medicine Given Hycodan cough syrup-clarified that allergy to codeine listed as only itching which is a side effect and not an allergy  Will add prednisone if he develops wheezing or bronchospasm. He will call back if worse

## 2017-06-18 NOTE — Patient Instructions (Signed)
Flu test is negative.  Levaquin 500 mg daily for 7 days Plenty of oral fluids. Delsym 5 mL twice daily over-the-counter as needed for cough, if no better call us back for prescription cough medicine

## 2017-06-18 NOTE — Assessment & Plan Note (Signed)
Continue anoro based on primary pulmonologist-although spirometry has not shown any evidence of airway obstruction

## 2017-06-22 NOTE — Progress Notes (Signed)
Remote pacemaker transmission.   

## 2017-06-24 ENCOUNTER — Encounter: Payer: Self-pay | Admitting: Cardiology

## 2017-06-28 NOTE — Progress Notes (Signed)
Cardiology Office Note Date:  06/29/2017  Patient ID:  [name] [name], DOB 1946/04/25, MRN [id number] PCP:  [name], William, MD  Electrophysiologist:  Dr. Graciela HusbandsKlein Pulmonology: Dr. Maple HudsonYoung   Chief Complaint: annual EP visit  History of Present Illness: [name] ColaceMarion C [name] is a 72 y.o. male with history of COPD, restrictive lung disease, HTN, DM, HLD, bradycardia w/PPM.  He comes today to be seen for Dr. Graciela HusbandsKlein, most recently treated for COPD/bronchitis 06/18/17, Dr. Vassie LollAlva pulmonology.  She was last seen by Dr. Graciela HusbandsKlein Feb 2018, he was doing well, his note mention a low risk myoview done in 2014.  He is being treated for recurrent bronchitis currently, outside of this feels well.  No CP, palpitations, no dizziness, near syncope or syncope.  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 Arthritis   . Bradycardia    pacemaker - Medtronic Adapta #ADDRO1 - December 22, 2005  . Carpal tunnel syndrome on both sides   . Carpal tunnel syndrome, bilateral   . Chronic back pain   . Chronic kidney disease   . COPD (chronic obstructive pulmonary disease) (HCC)   . Depression   . Diabetes mellitus    type II controled with diet  . GERD (gastroesophageal reflux disease)   . Headache(784.0)   . Hyperlipidemia   . Hypertension   . Insomnia   . Neuromuscular disorder (HCC)    peripheral neuropathy BLE  . Pneumonia   . Presence of permanent cardiac pacemaker   . Wears glasses     Past Surgical History:  Procedure Laterality Date  . BACK SURGERY    . CARPAL TUNNEL RELEASE Right 03/24/2017   Procedure: Right CARPAL TUNNEL RELEASE;  Surgeon: Maeola HarmanStern, Joseph, MD;  [facility];  Service: Neurosurgery;  Laterality: Right;  Right CARPAL TUNNEL RELEASE  . EP IMPLANTABLE DEVICE N/A 06/27/2015   Procedure:  PPM Generator Changeout;  Surgeon: Duke SalviaSteven C Klein, MD;   [facility];  Service: Cardiovascular;  Laterality: N/A;  . HEMORRHOID SURGERY    . INSERT / REPLACE / REMOVE PACEMAKER    . MULTIPLE TOOTH EXTRACTIONS    . PACEMAKER INSERTION  ?2007  . TOTAL HIP ARTHROPLASTY Right 01/25/2013   Procedure: TOTAL HIP ARTHROPLASTY ANTERIOR APPROACH;  Surgeon: Velna OchsPeter G Dalldorf, MD;  Location: [facility] OR;  Service: Orthopedics;  Laterality: Right;  . ULNAR NERVE TRANSPOSITION Right 03/24/2017   Procedure: Right Ulnar nerve release;  Surgeon: Maeola HarmanStern, Joseph, MD;  [facility];  Service: Neurosurgery;  Laterality: Right;  Right Ulnar nerve release    Current Outpatient Medications  Medication Sig Dispense Refill  . acetaminophen (TYLENOL) 650 MG CR tablet Take 1,300 mg daily as needed by mouth for pain.     Ailene Ards. ANORO ELLIPTA 62.5-25 MCG/INH AEPB Inhale 1 puff into the lungs daily.     [name]. aspirin 81 MG tablet Take 81 mg by mouth daily.    [name]. atorvastatin (LIPITOR) 40 MG tablet Take 40 mg by mouth daily.    [name]. azithromycin (ZITHROMAX) 250 MG tablet 2 today then one daily 6 tablet 0  . CALCIUM-MAGNESIUM-ZINC PO Take 1 capsule by mouth daily.    . famotidine (PEPCID) 20 MG tablet TAKE ONE TABLET BY MOUTH ONCE DAILY AT BEDTIME 30 tablet 0  . fluticasone (FLOVENT HFA) 110 MCG/ACT inhaler INHALE TWO PUFFS BY MOUTH TWICE DAILY  THEN RINSE MOUTH 1 Inhaler 12  . gabapentin (NEURONTIN) 300 MG capsule Take 300 mg by mouth 2 (two) times daily.    [name] glimepiride (AMARYL) 1 MG tablet Take 1 tablet by mouth daily.    [name] guaiFENesin (MUCINEX) 600 MG 12 hr tablet Take 600 mg by mouth 2 (two) times daily.    . hydrochlorothiazide (HYDRODIURIL) 12.5 MG tablet Take 12.5 mg daily by mouth.    [name] HYDROcodone-homatropine (HYCODAN) 5-1.5 MG/5ML syrup Take 5 mLs by mouth 2 (two) times daily as needed for cough. 100 mL 0  . ipratropium-albuterol (DUONEB) 0.5-2.5 (3) MG/3ML SOLN Inhale 3 mLs into the lungs every 6 (six) hours as needed (wheezing or shortness of breath).     [name]  levocetirizine (XYZAL) 5 MG tablet Take 5 mg at bedtime by mouth.     . levofloxacin (LEVAQUIN) 500 MG tablet Take 1 tablet (500 mg total) by mouth daily. 7 tablet 0  . montelukast (SINGULAIR) 10 MG tablet Take 10 mg at bedtime by mouth.     . Multiple Vitamin (MULTIVITAMIN WITH MINERALS) TABS tablet Take 1 tablet by mouth daily.    [name] olmesartan (BENICAR) 20 MG tablet Take 20 mg daily by mouth.    . pantoprazole (PROTONIX) 40 MG tablet TAKE ONE TABLET BY MOUTH ONCE DAILY TAKE  30-60  MINUTES  BEFORE  FIRST  MEAL  OF  THE  DAY 30 tablet 0  . PROAIR HFA 108 (90 BASE) MCG/ACT inhaler Inhale 2 puffs into the lungs 4 (four) times daily as needed.    [name] Respiratory Therapy Supplies (FLUTTER) DEVI Blow through 4 times per set, 3 sets per day when needed to clear lungs 1 each 0  . predniSONE (DELTASONE) 10 MG tablet Take 10 mg by mouth daily.     No current facility-administered medications for this visit.     Allergies:   Penicillins; Lopressor [metoprolol tartrate]; and Codeine   Social History:  The patient  reports that he quit smoking about 44 years ago. His smoking use included cigarettes. He started smoking about 59 years ago. He has a 30.00 pack-year smoking history. he has never used smokeless tobacco. He reports that he does not drink alcohol or use drugs.   Family History:  The patient's family history includes Allergies in his unknown relative; Asthma in his maternal aunt; Heart disease in his mother.  ROS:  Please see the history of present illness.  All other systems are reviewed and otherwise negative.   PHYSICAL EXAM:  VS:  BP 130/74   Pulse 75   Ht 6' (1.829 m)   Wt 210 lb (95.3 kg)   BMI 28.48 kg/m  BMI: Body mass index is 28.48 kg/m. Well nourished, well developed, in no acute distress  HEENT: normocephalic, atraumatic  Neck: no JVD, carotid bruits or masses Cardiac:  RRR; no significant murmurs, no rubs, or gallops Lungs: CTA b/l, no wheezing, rhonchi or rales  Abd:  soft, nontender MS: no deformity, age appropriate atrophy Ext:  no edema  Skin: warm and dry, no rash Neuro:  No gross deficits appreciated Psych: euthymic mood, full affect   PPM site is stable, no tethering or discomfort   EKG:  Done today shows SR, no acute looking changes PPM interrogation done today and reviewed by myself; battery and lead measurements are good, HVR/AHR are 1:1 tachycardia, longest 49 seconds.  AS/VS presenting 70bpm, AP 83%, VP<1%  08/29/14: TTE Study Conclusions - Left ventricle: The cavity size was normal.  There was mild focal   basal hypertrophy of the septum. Systolic function was normal.   The estimated ejection fraction was in the range of 55% to 60%.   Wall motion was normal; there were no regional wall motion   abnormalities. Doppler parameters are consistent with abnormal   left ventricular relaxation (grade 1 diastolic dysfunction). - Left atrium: The atrium was mildly dilated. - Pulmonary arteries: Systolic pressure was mildly increased. PA   peak pressure: 34 mm Hg (S). Impressions: - Normal LV function; grade 1 diastolic dysfunction; mild LAE;   trace TR; mildly elevated pulmonary pressure.  Recent Labs: 03/24/2017: BUN 22; Creatinine, Ser 1.38; Hemoglobin 14.5; Platelets 191; Potassium 4.8; Sodium 139  No results found for requested labs within last 8760 hours.   CrCl cannot be calculated (Patient's most recent lab result is older than the maximum 21 days allowed.).   Wt Readings from Last 3 Encounters:  06/29/17 210 lb (95.3 kg)  06/18/17 213 lb (96.6 kg)  05/27/17 210 lb 12.8 oz (95.6 kg)     Other studies reviewed: Additional studies/records reviewed today include: summarized above  ASSESSMENT AND PLAN:  1. Symptomatic bradycardia/PPM     Intact function, no changes made  2. HTN     Looks OK, no changes  3. HLD     Monitored and managed with his PMD    Disposition: F/u with Q 3 month remote device checks, in-clinic with EP  in 1 year, sooner if needed.  Current medicines are reviewed at length with the patient today.  The patient did not have any concerns regarding medicines.  Norma Fredrickson, PA-C 06/29/2017 12:31 PM     [facility] [address] [place] [address] [phone] (office)  [phone] (fax)

## 2017-06-29 ENCOUNTER — Ambulatory Visit: Payer: Medicare Other | Admitting: Physician Assistant

## 2017-06-29 VITALS — BP 130/74 | HR 75 | Ht 72.0 in | Wt 210.0 lb

## 2017-06-29 DIAGNOSIS — Z950 Presence of cardiac pacemaker: Secondary | ICD-10-CM

## 2017-06-29 DIAGNOSIS — I10 Essential (primary) hypertension: Secondary | ICD-10-CM

## 2017-06-29 DIAGNOSIS — I495 Sick sinus syndrome: Secondary | ICD-10-CM | POA: Diagnosis not present

## 2017-06-29 NOTE — Patient Instructions (Addendum)
Medication Instructions:   Your physician recommends that you continue on your current medications as directed. Please refer to the Current Medication list given to you today.   If you need a refill on your cardiac medications before your next appointment, please call your pharmacy.  Labwork: NONE ORDERED  TODAY    Testing/Procedures:  NONE ORDERED  TODAY     Follow-Up: Your physician wants you to follow-up in: ONE YEAR WITH Graciela HusbandsKLEIN  You will receive a reminder letter in the mail two months in advance. If you don't receive a letter, please call our office to schedule the follow-up appointment.   Remote monitoring is used to monitor your Pacemaker of ICD from home. This monitoring reduces the number of office visits required to check your device to one time per year. It allows us to keep an eye on the functioning of your device to ensure it is working properly. You are scheduled for a device check from home on . 09-17-17 You may send your transmission at any time that day. If you have a wireless device, the transmission will be sent automatically. After your physician reviews your transmission, you will receive a postcard with your next transmission date.     Any Other Special Instructions Will Be Listed Below (If Applicable).

## 2017-07-05 LAB — CUP PACEART REMOTE DEVICE CHECK
Battery Remaining Longevity: 132 mo
Battery Voltage: 2.78 V
Brady Statistic AP VP Percent: 0 %
Brady Statistic AS VS Percent: 17 %
Implantable Lead Implant Date: 20070820
Implantable Lead Location: 753859
Lead Channel Impedance Value: 589 Ohm
Lead Channel Pacing Threshold Amplitude: 1 V
Lead Channel Pacing Threshold Pulse Width: 0.4 ms
Lead Channel Setting Pacing Amplitude: 2 V
Lead Channel Setting Sensing Sensitivity: 1.4 mV
MDC IDC LEAD IMPLANT DT: 20070820
MDC IDC LEAD LOCATION: 753860
MDC IDC MSMT BATTERY IMPEDANCE: 135 Ohm
MDC IDC MSMT LEADCHNL RA IMPEDANCE VALUE: 487 Ohm
MDC IDC MSMT LEADCHNL RA PACING THRESHOLD AMPLITUDE: 0.875 V
MDC IDC MSMT LEADCHNL RA PACING THRESHOLD PULSEWIDTH: 0.4 ms
MDC IDC PG IMPLANT DT: 20170222
MDC IDC SESS DTM: 20190218133816
MDC IDC SET LEADCHNL RV PACING AMPLITUDE: 2.5 V
MDC IDC SET LEADCHNL RV PACING PULSEWIDTH: 0.4 ms
MDC IDC STAT BRADY AP VS PERCENT: 83 %
MDC IDC STAT BRADY AS VP PERCENT: 0 %

## 2017-09-17 ENCOUNTER — Ambulatory Visit (INDEPENDENT_AMBULATORY_CARE_PROVIDER_SITE_OTHER): Payer: Medicare Other | Admitting: *Deleted

## 2017-09-17 ENCOUNTER — Telehealth: Payer: Self-pay | Admitting: Cardiology

## 2017-09-17 DIAGNOSIS — I495 Sick sinus syndrome: Secondary | ICD-10-CM

## 2017-09-17 NOTE — Telephone Encounter (Signed)
LMOVM reminding pt to send remote transmission.   

## 2017-09-18 ENCOUNTER — Encounter: Payer: Self-pay | Admitting: Cardiology

## 2017-09-18 NOTE — Progress Notes (Signed)
Remote pacemaker transmission.   

## 2017-09-21 LAB — CUP PACEART REMOTE DEVICE CHECK
Battery Impedance: 135 Ohm
Brady Statistic AP VP Percent: 0 %
Brady Statistic AS VS Percent: 18 %
Date Time Interrogation Session: 20190516223403
Implantable Lead Location: 753859
Implantable Lead Model: 4092
Implantable Lead Model: 5076
Implantable Pulse Generator Implant Date: 20170222
Lead Channel Pacing Threshold Pulse Width: 0.4 ms
Lead Channel Pacing Threshold Pulse Width: 0.4 ms
Lead Channel Setting Pacing Amplitude: 2 V
Lead Channel Setting Pacing Pulse Width: 0.4 ms
Lead Channel Setting Sensing Sensitivity: 1.4 mV
MDC IDC LEAD IMPLANT DT: 20070820
MDC IDC LEAD IMPLANT DT: 20070820
MDC IDC LEAD LOCATION: 753860
MDC IDC MSMT BATTERY REMAINING LONGEVITY: 131 mo
MDC IDC MSMT BATTERY VOLTAGE: 2.78 V
MDC IDC MSMT LEADCHNL RA IMPEDANCE VALUE: 466 Ohm
MDC IDC MSMT LEADCHNL RA PACING THRESHOLD AMPLITUDE: 0.875 V
MDC IDC MSMT LEADCHNL RV IMPEDANCE VALUE: 601 Ohm
MDC IDC MSMT LEADCHNL RV PACING THRESHOLD AMPLITUDE: 0.875 V
MDC IDC SET LEADCHNL RV PACING AMPLITUDE: 2.5 V
MDC IDC STAT BRADY AP VS PERCENT: 81 %
MDC IDC STAT BRADY AS VP PERCENT: 0 %

## 2017-09-24 ENCOUNTER — Ambulatory Visit: Payer: Medicare Other | Admitting: Internal Medicine

## 2017-10-25 NOTE — Progress Notes (Signed)
@Patient  ID: [name] [name], male    DOB: 1945/12/17, 72 y.o.   MRN: [id number]  Chief Complaint  Patient presents with  . Follow-up    PFT today    Referring provider: [name], MD  HPI: M former smoker with COPD, chronic cough  complicated by Cardiac dysrhythmia/ pacemaker, HBP   >>>Office Spirometry 08/11/14-moderate obstruction/restriction-FVC 2.95/62%, FEV1 2.37/65%, ratio 0.79, FEF 25-75% 2.43/77%   Recent [facility] Encounters:   02/11/2016-72 year old male former smoker followed for COPD, restrictive lung disease, cough, complicated by cardiac arrhythmia/pacemaker, HBP, DM FOLLOWS FOR pt has cough about a week ago mucus stuck in throat. mucus was yellow. Shortness of breath with activity. Wants something instead of mucinex (too costly).  Anoro, ProAir, Neb/ Duoneb, Singulair, gabapentin, Xyzal  Intermittent feeling "like a briar" sensation in his throat at level of the larynx 2 weeks. Taking daily Mucinex but finds that too expensive. Nebulizer and sips of liquids help. No solid food hanging up. No change in breathing otherwise.  05/27/17- 72 year old male former smoker followed for COPD, restrictive lung disease, cough, complicated by cardiac arrhythmia/pacemaker, HBP, DM ------Pt has productive cough- yellow mucus, post nasal drip, sore throat, and some SOB with exertion. Pt states been feeling like this for 2-3 weeks. Intervals of sinus pressure with postnasal drip and throat tickle cough.  Not much headache or fever. Complains of dyspnea on exertion trying to walk 1/2 mile on treadmill.  Exercises with machines regularly at "Y".  Not wheezing. UTD flu and pvax     10/26/17 Office visit today-pulmonary function test  Pleasant 72 year old patient reports office today for completion of pulmonary function test.  Patient is reporting no other respiratory changes just continued shortness of breath with exertion.  Patient reports adherence to his  Anoro.  Pulmonary function test today is very similar to 2017.  Slight mid flow reversibility.  Decreased DLCO which is stable at 59.  Moderate restriction seen on flow volume loops.  Discussed with patient.    Allergies  Allergen Reactions  . Penicillins Anaphylaxis    Has patient had a PCN reaction causing immediate rash, facial/tongue/throat swelling, SOB or lightheadedness with hypotension: Yes Has patient had a PCN reaction causing severe rash involving mucus membranes or skin necrosis: No Has patient had a PCN reaction that required hospitalization: Yes Has patient had a PCN reaction occurring within the last 10 years: No If all of the above answers are "NO", then may proceed with Cephalosporin use.   [name] Lopressor [Metoprolol Tartrate]     " It drops my heart rate."  . Codeine Rash    Immunization History  Administered Date(s) Administered  . Influenza Split 02/02/2014, 02/24/2017  . Influenza, High Dose Seasonal PF 02/01/2016  . Influenza,inj,Quad PF,6+ Mos 01/27/2013, 05/07/2015  . Pneumococcal Conjugate-13 08/25/2014  . Pneumococcal-Unspecified 02/02/2014  . Td 08/05/2006  . Tdap 08/05/2006    Past Medical History:  Diagnosis Date  . Arthritis   . Bradycardia    pacemaker - Medtronic Adapta #[id number] - December 22, 2005  . Carpal tunnel syndrome on both sides   . Carpal tunnel syndrome, bilateral   . Chronic back pain   . Chronic kidney disease   . COPD (chronic obstructive pulmonary disease) (HCC)   . Depression   . Diabetes mellitus    type II controled with diet  . GERD (gastroesophageal reflux disease)   . Headache(784.0)   . Hyperlipidemia   . Hypertension   . Insomnia   . Neuromuscular disorder (  HCC)    peripheral neuropathy BLE  . Pneumonia   . Presence of permanent cardiac pacemaker   . Wears glasses     Tobacco History: Social History   Tobacco Use  Smoking Status Former Smoker  . Packs/day: 2.00  . Years: 15.00  . Pack years: 30.00  .  Types: Cigarettes  . Start date: 05/05/1958  . Last attempt to quit: 05/05/1973  . Years since quitting: 44.5  Smokeless Tobacco Never Used   Counseling given: Not Answered Continue not smoking.  Outpatient Encounter Medications as of 10/26/2017  Medication Sig  . acetaminophen (TYLENOL) 650 MG CR tablet Take 1,300 mg daily as needed by mouth for pain.   Ailene Ards ELLIPTA 62.5-25 MCG/INH AEPB Inhale 1 puff into the lungs daily.   [name] aspirin 81 MG tablet Take 81 mg by mouth daily.  [name] atorvastatin (LIPITOR) 40 MG tablet Take 40 mg by mouth daily.  [name] CALCIUM-MAGNESIUM-ZINC PO Take 1 capsule by mouth daily.  . famotidine (PEPCID) 20 MG tablet TAKE ONE TABLET BY MOUTH ONCE DAILY AT BEDTIME  . fluticasone (FLOVENT HFA) 110 MCG/ACT inhaler INHALE TWO PUFFS BY MOUTH TWICE DAILY THEN RINSE MOUTH  . gabapentin (NEURONTIN) 300 MG capsule Take 300 mg by mouth 2 (two) times daily.  [name] glimepiride (AMARYL) 1 MG tablet Take 1 tablet by mouth daily.  [name] guaiFENesin (MUCINEX) 600 MG 12 hr tablet Take 600 mg by mouth 2 (two) times daily.  . hydrochlorothiazide (HYDRODIURIL) 12.5 MG tablet Take 12.5 mg daily by mouth.  [name] ipratropium-albuterol (DUONEB) 0.5-2.5 (3) MG/3ML SOLN Inhale 3 mLs into the lungs every 6 (six) hours as needed (wheezing or shortness of breath).   [name] levocetirizine (XYZAL) 5 MG tablet Take 5 mg at bedtime by mouth.   . montelukast (SINGULAIR) 10 MG tablet Take 10 mg at bedtime by mouth.   . Multiple Vitamin (MULTIVITAMIN WITH MINERALS) TABS tablet Take 1 tablet by mouth daily.  [name] olmesartan (BENICAR) 20 MG tablet Take 20 mg daily by mouth.  . pantoprazole (PROTONIX) 40 MG tablet TAKE ONE TABLET BY MOUTH ONCE DAILY TAKE  30-60  MINUTES  BEFORE  FIRST  MEAL  OF  THE  DAY  . predniSONE (DELTASONE) 10 MG tablet Take 10 mg by mouth daily.  [name] PROAIR HFA 108 (90 BASE) MCG/ACT inhaler Inhale 2 puffs into the lungs 4 (four) times daily as needed.  [name] Respiratory Therapy Supplies (FLUTTER) DEVI Blow  through 4 times per set, 3 sets per day when needed to clear lungs  . Fluticasone-Umeclidin-Vilant (TRELEGY ELLIPTA) 100-62.5-25 MCG/INH AEPB Inhale 1 puff into the lungs daily.  . [DISCONTINUED] azithromycin (ZITHROMAX) 250 MG tablet 2 today then one daily  . [DISCONTINUED] HYDROcodone-homatropine (HYCODAN) 5-1.5 MG/5ML syrup Take 5 mLs by mouth 2 (two) times daily as needed for cough.  . [DISCONTINUED] levofloxacin (LEVAQUIN) 500 MG tablet Take 1 tablet (500 mg total) by mouth daily.   No facility-administered encounter medications on file as of 10/26/2017.      Review of Systems  Constitutional:   No  weight loss, night sweats,  fevers, chills, fatigue, or  lassitude HEENT:   No headaches,  Difficulty swallowing,  Tooth/dental problems, or  Sore throat, No sneezing, itching, ear ache, nasal congestion, post nasal drip  CV: No chest pain,  orthopnea, PND, swelling in lower extremities, anasarca, dizziness, palpitations, syncope  GI: No heartburn, indigestion, abdominal pain, nausea, vomiting, diarrhea, change in bowel habits, loss of appetite, bloody stools Resp: +sob with  exertion, dry cough occasionally at night No shortness of breath at rest.  No excess mucus, no productive cough,   No coughing up of blood.  No change in color of mucus.  No wheezing.  No chest wall deformity Skin: no rash, lesions, no skin changes. GU: no dysuria, change in color of urine, no urgency or frequency.  No flank pain, no hematuria  MS:  No joint pain or swelling.  No decreased range of motion.  No back pain. Psych:  No change in mood or affect. No depression or anxiety.  No memory loss.   Physical Exam  BP 140/84 (BP Location: Right Arm, Cuff Size: Normal)   Pulse 61   Ht 6' (1.829 m)   Wt 199 lb (90.3 kg)   SpO2 97%   BMI 26.99 kg/m   Wt Readings from Last 3 Encounters:  10/26/17 199 lb (90.3 kg)  06/29/17 210 lb (95.3 kg)  06/18/17 213 lb (96.6 kg)     GEN: A/Ox3; pleasant , NAD, well  nourished, on RA   HEENT:  [place]/AT,  EACs-clear, TMs-wnl, NOSE-clear, THROAT-clear, no lesions, no postnasal drip or exudate noted.   NECK:  Supple w/ fair ROM; no JVD;  no lymphadenopathy.    RESP:  +diminished in bases, clear without wheeze, air movement through out  no accessory muscle use, no dullness to percussion  CARD:  RRR, no m/r/g, no peripheral edema, pulses intact, no cyanosis or clubbing.  GI:   Soft & nt; nml bowel sounds; no organomegaly or masses detected.   Musco: Warm bil, no deformities or joint swelling noted.   Neuro: alert, no focal deficits noted.    Skin: Warm, no lesions or rashes   Walk in office today does not show any desats and oxygenation.   Lab Results:  CBC    Component Value Date/Time   WBC 8.3 03/24/2017 0823   RBC 4.53 03/24/2017 0823   HGB 14.5 03/24/2017 0823   HCT 42.7 03/24/2017 0823   PLT 191 03/24/2017 0823   MCV 94.3 03/24/2017 0823   MCH 32.0 03/24/2017 0823   MCHC 34.0 03/24/2017 0823   RDW 13.6 03/24/2017 0823   LYMPHSABS 3.2 01/17/2013 0858   MONOABS 0.8 01/17/2013 0858   EOSABS 0.4 01/17/2013 0858   BASOSABS 0.0 01/17/2013 0858    BMET    Component Value Date/Time   NA 139 03/24/2017 0823   K 4.8 03/24/2017 0823   CL 106 03/24/2017 0823   CO2 23 03/24/2017 0823   GLUCOSE 128 (H) 03/24/2017 0823   BUN 22 (H) 03/24/2017 0823   CREATININE 1.38 (H) 03/24/2017 0823   CREATININE 1.23 (H) 06/07/2015 1329   CALCIUM 9.0 03/24/2017 0823   GFRNONAA 49 (L) 03/24/2017 0823   GFRAA 57 (L) 03/24/2017 0823    BNP No results found for: BNP  ProBNP    Component Value Date/Time   PROBNP 61.0 08/28/2014 0935    Imaging: No results found.   Assessment & Plan:   72 year old patient seen office today.  Patient with continued shortness of breath with exertion.  Will try patient on therapeutic trial of trilogy.  Discussed with patient that may not address symptoms and if that is the case call us let us know we will resume  your Anoro.  The therapeutic trial of trelegy will replace your Anoro for this time being.  When you are halfway down with your samples please contact our office let us know how you are doing.  Your  pulmonary function test today are relatively stable compared to 2017.  DLCO is still diminished but stable at 59.  Follow-up in 3 months to see Dr. Maple HudsonYoung.  COPD with chronic bronchitis (HCC) We will do trelegy sample today >>> This replaces your Anoro >>> Take 1 puff daily right when you wake up, rinse out your mouth after use >>> Call us when you are halfway done with her the samples we provided and let us know if you are seeing any improvement  Continue your rescue inhaler as prescribed, you can use as needed for shortness of breath as well as wheezing  Walk today in office showed that you did not need oxygen.  Continue follow-up with our office for increased shortness of breath, fever, changes in your respiratory status.  Follow-up in 3 months.   Upper airway cough syndrome Continue Protonix Continue Pepcid Follow-up with our office if symptoms worsen   GERD (gastroesophageal reflux disease) Continue Protonix Continue Pepcid Discussed appropriate diet with patient as well as provided information on discharge paperwork     Coral CeoBrian P Zeidy Tayag, NP 10/26/2017

## 2017-10-26 ENCOUNTER — Encounter: Payer: Self-pay | Admitting: Pulmonary Disease

## 2017-10-26 ENCOUNTER — Ambulatory Visit: Payer: Medicare Other | Admitting: Internal Medicine

## 2017-10-26 ENCOUNTER — Ambulatory Visit (INDEPENDENT_AMBULATORY_CARE_PROVIDER_SITE_OTHER): Payer: Medicare Other | Admitting: Pulmonary Disease

## 2017-10-26 VITALS — BP 140/84 | HR 61 | Ht 72.0 in | Wt 199.0 lb

## 2017-10-26 DIAGNOSIS — R05 Cough: Secondary | ICD-10-CM

## 2017-10-26 DIAGNOSIS — K219 Gastro-esophageal reflux disease without esophagitis: Secondary | ICD-10-CM | POA: Diagnosis not present

## 2017-10-26 DIAGNOSIS — R0609 Other forms of dyspnea: Secondary | ICD-10-CM | POA: Diagnosis not present

## 2017-10-26 DIAGNOSIS — J449 Chronic obstructive pulmonary disease, unspecified: Secondary | ICD-10-CM

## 2017-10-26 DIAGNOSIS — R058 Other specified cough: Secondary | ICD-10-CM

## 2017-10-26 DIAGNOSIS — R0600 Dyspnea, unspecified: Secondary | ICD-10-CM

## 2017-10-26 LAB — PULMONARY FUNCTION TEST
DL/VA % pred: 77 %
DL/VA: 3.64 ml/min/mmHg/L
DLCO UNC % PRED: 59 %
DLCO UNC: 21.37 ml/min/mmHg
FEF 25-75 POST: 4.3 L/s
FEF 25-75 Pre: 3.27 L/sec
FEF2575-%Change-Post: 31 %
FEF2575-%PRED-POST: 172 %
FEF2575-%PRED-PRE: 130 %
FEV1-%Change-Post: 6 %
FEV1-%Pred-Post: 93 %
FEV1-%Pred-Pre: 88 %
FEV1-POST: 3.21 L
FEV1-Pre: 3.03 L
FEV1FVC-%Change-Post: 1 %
FEV1FVC-%PRED-PRE: 111 %
FEV6-%Change-Post: 4 %
FEV6-%Pred-Post: 88 %
FEV6-%Pred-Pre: 83 %
FEV6-POST: 3.89 L
FEV6-Pre: 3.7 L
FEV6FVC-%PRED-POST: 106 %
FEV6FVC-%Pred-Pre: 106 %
FVC-%Change-Post: 4 %
FVC-%PRED-POST: 82 %
FVC-%Pred-Pre: 78 %
FVC-Post: 3.89 L
FVC-Pre: 3.7 L
PRE FEV1/FVC RATIO: 82 %
Post FEV1/FVC ratio: 83 %
Post FEV6/FVC ratio: 100 %
Pre FEV6/FVC Ratio: 100 %
RV % pred: 94 %
RV: 2.52 L
TLC % pred: 87 %
TLC: 6.57 L

## 2017-10-26 MED ORDER — FLUTICASONE-UMECLIDIN-VILANT 100-62.5-25 MCG/INH IN AEPB: 1.0000 | INHALATION_SPRAY | Freq: Every day | RESPIRATORY_TRACT | 0 refills | Status: DC

## 2017-10-26 NOTE — Assessment & Plan Note (Signed)
Continue Protonix Continue Pepcid Follow-up with our office if symptoms worsen

## 2017-10-26 NOTE — Assessment & Plan Note (Signed)
We will do trelegy sample today >>> This replaces your Anoro >>> Take 1 puff daily right when you wake up, rinse out your mouth after use >>> Call us when you are halfway done with her the samples we provided and let us know if you are seeing any improvement  Continue your rescue inhaler as prescribed, you can use as needed for shortness of breath as well as wheezing  Walk today in office showed that you did not need oxygen.  Continue follow-up with our office for increased shortness of breath, fever, changes in your respiratory status.  Follow-up in 3 months.

## 2017-10-26 NOTE — Assessment & Plan Note (Signed)
Continue Protonix Continue Pepcid Discussed appropriate diet with patient as well as provided information on discharge paperwork

## 2017-10-26 NOTE — Progress Notes (Signed)
PFT completed today. 10/26/17  

## 2017-10-26 NOTE — Patient Instructions (Addendum)
We will do trelegy sample today >>> This replaces your Anoro >>> Take 1 puff daily right when you wake up, rinse out your mouth after use >>> Call us when you are halfway done with her the samples we provided and let us know if you are seeing any improvement  Continue your rescue inhaler as prescribed, you can use as needed for shortness of breath as well as wheezing  Continue your Protonix as well as your Pepcid for management of your gastroesophageal reflux disease  Walk today in office showed that you did not need oxygen.  Continue follow-up with our office for increased shortness of breath, fever, changes in your respiratory status.  Follow-up in 3 months.  Please contact the office if your symptoms worsen or you have concerns that you are not improving.   Thank you for choosing [facility] for your healthcare, and for allowing us to partner with you on your healthcare journey. I am thankful to be able to provide care to you today.   Elisha HeadlandBrian Amyra Vantuyl FNP-C    Food Choices for Gastroesophageal Reflux Disease, Adult When you have gastroesophageal reflux disease (GERD), the foods you eat and your eating habits are very important. Choosing the right foods can help ease your discomfort. What guidelines do I need to follow?  Choose fruits, vegetables, whole grains, and low-fat dairy products.  Choose low-fat meat, fish, and poultry.  Limit fats such as oils, salad dressings, butter, nuts, and avocado.  Keep a food diary. This helps you identify foods that cause symptoms.  Avoid foods that cause symptoms. These may be different for everyone.  Eat small meals often instead of 3 large meals a day.  Eat your meals slowly, in a place where you are relaxed.  Limit fried foods.  Cook foods using methods other than frying.  Avoid drinking alcohol.  Avoid drinking large amounts of liquids with your meals.  Avoid bending over or lying down until 2-3 hours after eating. What  foods are not recommended? These are some foods and drinks that may make your symptoms worse: Vegetables Tomatoes. Tomato juice. Tomato and spaghetti sauce. Chili peppers. Onion and garlic. Horseradish. Fruits Oranges, grapefruit, and lemon (fruit and juice). Meats High-fat meats, fish, and poultry. This includes hot dogs, ribs, ham, sausage, salami, and bacon. Dairy Whole milk and chocolate milk. Sour cream. Cream. Butter. Ice cream. Cream cheese. Drinks Coffee and tea. Bubbly (carbonated) drinks or energy drinks. Condiments Hot sauce. Barbecue sauce. Sweets/Desserts Chocolate and cocoa. Donuts. Peppermint and spearmint. Fats and Oils High-fat foods. This includes [place] fries and potato chips. Other Vinegar. Strong spices. This includes black pepper, white pepper, red pepper, cayenne, curry powder, cloves, ginger, and chili powder. The items listed above may not be a complete list of foods and drinks to avoid. Contact your dietitian for more information. This information is not intended to replace advice given to you by your health care provider. Make sure you discuss any questions you have with your health care provider. Document Released: 10/21/2011 Document Revised: 09/27/2015 Document Reviewed: 02/23/2013 Elsevier Interactive Patient Education  2017 [employer]

## 2017-11-13 ENCOUNTER — Telehealth: Payer: Self-pay | Admitting: Internal Medicine

## 2017-11-13 NOTE — Telephone Encounter (Signed)
Called patient, unable to reach left message to give us a call back. 

## 2017-11-16 MED ORDER — GUAIFENESIN ER 600 MG PO TB12: 600.0000 mg | ORAL_TABLET | Freq: Two times a day (BID) | ORAL | 3 refills | Status: DC

## 2017-11-16 NOTE — Telephone Encounter (Signed)
Called and spoke with pt who stated he received a sample of Trelegy but he states he does not think Trelegy is working any better than the Anoro.  Pt is also wanting a Rx Guaifenesin to see if it would help with his cough.  Dr. Maple HudsonYoung, please advise. Thanks!

## 2017-11-16 NOTE — Telephone Encounter (Signed)
Pt is returning call. Cb is [phone].

## 2017-11-16 NOTE — Telephone Encounter (Signed)
Called and spoke with pt letting him know we were going to d/c the trelegy since it was not working any better for him. Also stated to pt he could get the mucinex dm OTC.  Pt stated he would rather have an Rx sent in instead of getting it OTC due to the cost. Sent script to pt's pharmacy at his request. Nothing further needed.

## 2017-11-16 NOTE — Telephone Encounter (Signed)
Attempted to call pt. I did not receive an answer. I have left a message for pt to return our call.  

## 2017-11-16 NOTE — Telephone Encounter (Signed)
Ok to drop Trelegy and stay with his Anoro inhaler  Guaifenesin is in Mucinex-DM, Delsym and Robitussin DM. These are all otc meds. He is welcome to see if they help.

## 2017-12-17 ENCOUNTER — Ambulatory Visit: Payer: Medicare Other | Admitting: *Deleted

## 2017-12-17 ENCOUNTER — Telehealth: Payer: Self-pay | Admitting: Cardiology

## 2017-12-17 NOTE — Telephone Encounter (Signed)
Confirmed remote transmission w/ pt son.    

## 2017-12-21 ENCOUNTER — Ambulatory Visit (INDEPENDENT_AMBULATORY_CARE_PROVIDER_SITE_OTHER): Payer: Medicare Other | Admitting: *Deleted

## 2017-12-21 DIAGNOSIS — I495 Sick sinus syndrome: Secondary | ICD-10-CM

## 2017-12-22 NOTE — Progress Notes (Signed)
Remote pacemaker transmission.   

## 2018-01-15 LAB — CUP PACEART REMOTE DEVICE CHECK
Battery Remaining Longevity: 125 mo
Brady Statistic AP VP Percent: 0 %
Brady Statistic AP VS Percent: 80 %
Brady Statistic AS VP Percent: 0 %
Brady Statistic AS VS Percent: 20 %
Date Time Interrogation Session: 20190817132408
Implantable Lead Implant Date: 20070820
Implantable Lead Location: 753859
Implantable Lead Location: 753860
Lead Channel Impedance Value: 460 Ohm
Lead Channel Impedance Value: 575 Ohm
Lead Channel Pacing Threshold Amplitude: 0.875 V
Lead Channel Pacing Threshold Amplitude: 1 V
Lead Channel Pacing Threshold Pulse Width: 0.4 ms
Lead Channel Setting Pacing Amplitude: 2 V
Lead Channel Setting Pacing Amplitude: 2.5 V
MDC IDC LEAD IMPLANT DT: 20070820
MDC IDC MSMT BATTERY IMPEDANCE: 159 Ohm
MDC IDC MSMT BATTERY VOLTAGE: 2.79 V
MDC IDC MSMT LEADCHNL RA PACING THRESHOLD PULSEWIDTH: 0.4 ms
MDC IDC PG IMPLANT DT: 20170222
MDC IDC SET LEADCHNL RV PACING PULSEWIDTH: 0.4 ms
MDC IDC SET LEADCHNL RV SENSING SENSITIVITY: 1.4 mV

## 2018-03-22 ENCOUNTER — Ambulatory Visit (INDEPENDENT_AMBULATORY_CARE_PROVIDER_SITE_OTHER): Payer: Medicare Other

## 2018-03-22 ENCOUNTER — Telehealth: Payer: Self-pay

## 2018-03-22 DIAGNOSIS — I495 Sick sinus syndrome: Secondary | ICD-10-CM | POA: Diagnosis not present

## 2018-03-22 DIAGNOSIS — R001 Bradycardia, unspecified: Secondary | ICD-10-CM

## 2018-03-22 NOTE — Telephone Encounter (Signed)
LMOVM reminding pt to send remote transmission.   

## 2018-03-23 NOTE — Progress Notes (Signed)
Remote pacemaker transmission.   

## 2018-03-25 ENCOUNTER — Encounter: Payer: Self-pay | Admitting: Cardiology

## 2018-04-21 ENCOUNTER — Telehealth: Payer: Self-pay | Admitting: Cardiology

## 2018-04-21 NOTE — Telephone Encounter (Signed)
Outpatient page from [name] daughter stating that his blood pressure has been elevated at 165/102 earlier today.  He had mild complaints of dizziness yesterday afternoon but no syncope.  Patient's daughter has checked his blood sugar which was found to be normal.  She states his blood pressure normally runs in the 120s to 130s over 60s to 70s.  I have instructed her to give him 1 additional HCTZ 12.5 mg dose of his blood pressure medication.  She has plans to call the office in the morning to see if they can be seen.  Additionally have instructed her to call his PCP office if we are not able to find him an appointment.  He is currently stable, chest pain-free with no other complaints.  If his dizziness persists or he has a syncopal episode, I have instructed the daughter to bring her father to the emergency department for further evaluation.  She agrees with the plan.   Georgie ChardJill Tashema Tiller NP-C [facility] Pager: [phone]

## 2018-04-22 ENCOUNTER — Encounter: Payer: Self-pay | Admitting: Internal Medicine

## 2018-04-22 ENCOUNTER — Ambulatory Visit (INDEPENDENT_AMBULATORY_CARE_PROVIDER_SITE_OTHER): Payer: Medicare Other | Admitting: Internal Medicine

## 2018-04-22 VITALS — BP 157/90 | HR 66 | Ht 72.0 in | Wt 203.6 lb

## 2018-04-22 DIAGNOSIS — I10 Essential (primary) hypertension: Secondary | ICD-10-CM | POA: Diagnosis not present

## 2018-04-22 DIAGNOSIS — Z950 Presence of cardiac pacemaker: Secondary | ICD-10-CM

## 2018-04-22 DIAGNOSIS — I495 Sick sinus syndrome: Secondary | ICD-10-CM | POA: Diagnosis not present

## 2018-04-22 NOTE — Progress Notes (Signed)
Patient Care Team: [name] BlamerHarris, William, [name] as PCP - General (Family Medicine)   HPI:  [name] [name] is a 72 y.o. male Seen in followup for pacemaker implanted for chronotropic incompetence and sinus node dysfunction by Dr. Erie NoeJE.  He underwent gen replacement 2/17     BP is elevated and has been for some time-- Also recently been struggling with dyspnea on exertion.  This is been unassociated with chest pain.  No edema.  Has COPD and coughing has been worse.  DATE TEST EF   9/14 MYOVIEW    % No ischemia  4/16 Echo  55-60 %         He has had a couple of episodes of lightheadedness and presyncope.  One occurred 2 days ago.  [name]  He got in the car and then went to [name].  He was dizzy on both occasions.  Time duration is not clear (see below).  He has been noted over the recent week or so to have high blood pressure.  According to his daughter, this is quite unusual; however, looking back over the last for 5 years there have been repeated episodes of low high blood pressure although over the last 2 years blood pressures have typically been lower than 150.    Past Medical History:  Diagnosis Date  . Arthritis   . Bradycardia    pacemaker - Medtronic Adapta #[id number] - December 22, 2005  . Carpal tunnel syndrome on both sides   . Carpal tunnel syndrome, bilateral   . Chronic back pain   . Chronic kidney disease   . COPD (chronic obstructive pulmonary disease) (HCC)   . Depression   . Diabetes mellitus    type II controled with diet  . GERD (gastroesophageal reflux disease)   . Headache(784.0)   . Hyperlipidemia   . Hypertension   . Insomnia   . Neuromuscular disorder (HCC)    peripheral neuropathy BLE  . Pneumonia   . Presence of permanent cardiac pacemaker   . Wears glasses     Past Surgical History:  Procedure Laterality Date  . BACK SURGERY    . CARPAL TUNNEL RELEASE Right 03/24/2017   Procedure: Right CARPAL TUNNEL RELEASE;  Surgeon: [name] [name],  [name] [name];  [facility];  Service: Neurosurgery;  Laterality: Right;  Right CARPAL TUNNEL RELEASE  . EP IMPLANTABLE DEVICE N/A 06/27/2015   Procedure:  PPM Generator Changeout;  Surgeon: [name] [name] [name] Shanley Furlough, [name];  [facility];  Service: Cardiovascular;  Laterality: N/A;  . HEMORRHOID SURGERY    . INSERT / REPLACE / REMOVE PACEMAKER    . MULTIPLE TOOTH EXTRACTIONS    . PACEMAKER INSERTION  ?2007  . TOTAL HIP ARTHROPLASTY Right 01/25/2013   Procedure: TOTAL HIP ARTHROPLASTY ANTERIOR APPROACH;  Surgeon: [name] [name] [name] Dalldorf, [name];  Location: [facility] OR;  Service: Orthopedics;  Laterality: Right;  . ULNAR NERVE TRANSPOSITION Right 03/24/2017   Procedure: Right Ulnar nerve release;  Surgeon: [name] [name];  [facility];  Service: Neurosurgery;  Laterality: Right;  Right Ulnar nerve release    Current Outpatient Medications  Medication Sig Dispense Refill  . acetaminophen (TYLENOL) 650 MG CR tablet Take 1,300 mg daily as needed by mouth for pain.     [name] [name]. ANORO ELLIPTA 62.5-25 MCG/INH AEPB Inhale 1 puff into the lungs daily.     [name]. aspirin 81 MG tablet Take 81 mg by mouth daily.    [name]. atorvastatin (  LIPITOR) 40 MG tablet Take 40 mg by mouth daily.    [name]. CALCIUM-MAGNESIUM-ZINC PO Take 1 capsule by mouth daily.    [name]. docusate sodium (COLACE) 100 MG capsule Take 100 mg by mouth 2 (two) times daily.    . famotidine (PEPCID) 20 MG tablet TAKE ONE TABLET BY MOUTH ONCE DAILY AT BEDTIME 30 tablet 0  . fluticasone (FLOVENT HFA) 110 MCG/ACT inhaler INHALE TWO PUFFS BY MOUTH TWICE DAILY THEN RINSE MOUTH 1 Inhaler 12  . gabapentin (NEURONTIN) 300 MG capsule Take 300 mg by mouth 2 (two) times daily.    [name]. glimepiride (AMARYL) 1 MG tablet Take 1 tablet by mouth daily.    [name]. guaiFENesin (MUCINEX) 600 MG 12 hr tablet Take 1 tablet (600 mg total) by mouth 2 (two) times daily. 30 tablet 3  . hydrochlorothiazide (HYDRODIURIL) 12.5 MG tablet Take 12.5 mg daily by mouth.    [name]. ipratropium-albuterol (DUONEB) 0.5-2.5  (3) MG/3ML SOLN Inhale 3 mLs into the lungs every 6 (six) hours as needed (wheezing or shortness of breath).     [name]. levocetirizine (XYZAL) 5 MG tablet Take 5 mg at bedtime by mouth.     . montelukast (SINGULAIR) 10 MG tablet Take 10 mg at bedtime by mouth.     . Multiple Vitamin (MULTIVITAMIN WITH MINERALS) TABS tablet Take 1 tablet by mouth daily.    [name]. olmesartan (BENICAR) 20 MG tablet Take 20 mg daily by mouth.    . pantoprazole (PROTONIX) 40 MG tablet TAKE ONE TABLET BY MOUTH ONCE DAILY TAKE  30-60  MINUTES  BEFORE  FIRST  MEAL  OF  THE  DAY 30 tablet 0  . PROAIR HFA 108 (90 BASE) MCG/ACT inhaler Inhale 2 puffs into the lungs 4 (four) times daily as needed.    [name]. Respiratory Therapy Supplies (FLUTTER) DEVI Blow through 4 times per set, 3 sets per day when needed to clear lungs 1 each 0   No current facility-administered medications for this visit.     Allergies  Allergen Reactions  . Penicillins Anaphylaxis    Has patient had a PCN reaction causing immediate rash, facial/tongue/throat swelling, SOB or lightheadedness with hypotension: Yes Has patient had a PCN reaction causing severe rash involving mucus membranes or skin necrosis: No Has patient had a PCN reaction that required hospitalization: Yes Has patient had a PCN reaction occurring within the last 10 years: No If all of the above answers are "NO", then may proceed with Cephalosporin use.   [name]. [name] [name] [Metoprolol Tartrate]     " It drops my heart rate."  . Codeine Rash    Review of Systems negative except from HPI and PMH  Physical Exam BP (!) 157/90   Pulse 66   Ht 6' (1.829 m)   Wt 203 lb 9.6 oz (92.4 kg)   SpO2 96%   BMI 27.61 kg/m    Well developed and nourished in no acute distress HENT normal Neck supple with JVP-flat Clear with end expiratory wheezes particularly with forced expiration Regular rate and rhythm, no murmurs or gallops Abd-soft with active BS No Clubbing cyanosis edema Skin-warm and dry A &  Oriented  Grossly normal sensory and motor function  ECG Sinus @ 64 18/11/40  Assessment and  Plan  Sinus node dysfunction   Pacemaker Medtronic The patient's device was interrogated.  The information was reviewed. No changes were made in the programming.     Hypertension   Atrial tachycardia  COPD  Presyncope  His dyspnea may well be related to COPD.  He has been coughing and has more wheezing.  It is possible with his multiple cardiac risk factors that he is also developed ischemic heart disease not previously known.  Initially, we will begin him on diltiazem for his atrial tachycardia.   We will also have him follow-up with Dr. Maple Hudson regarding his COPD.  In the event that these things do not attenuate his symptoms, would undertake cardiac CT to look for coronary artery disease.  Diltiazem will also help with his elevated blood pressure.

## 2018-04-22 NOTE — Patient Instructions (Addendum)
Medication Instructions:  Your physician recommends that you continue on your current medications as directed. Please refer to the Current Medication list given to you today.  Labwork: None ordered.  Testing/Procedures: None ordered.  Follow-Up: Your physician recommends that you schedule a follow-up appointment in:   One Year with Renee Urusy, PA  Any Other Special Instructions Will Be Listed Below (If Applicable).     If you need a refill on your cardiac medications before your next appointment, please call your pharmacy.  

## 2018-04-23 ENCOUNTER — Telehealth: Payer: Self-pay | Admitting: Internal Medicine

## 2018-04-23 LAB — CUP PACEART INCLINIC DEVICE CHECK
Battery Impedance: 183 Ohm
Battery Remaining Longevity: 121 mo
Battery Voltage: 2.78 V
Brady Statistic AP VP Percent: 1 %
Brady Statistic AP VS Percent: 83 %
Brady Statistic AS VP Percent: 0 %
Date Time Interrogation Session: 20191219222251
Implantable Lead Implant Date: 20070820
Implantable Lead Implant Date: 20070820
Implantable Lead Location: 753859
Implantable Lead Location: 753860
Implantable Lead Model: 4092
Implantable Lead Model: 5076
Implantable Pulse Generator Implant Date: 20170222
Lead Channel Impedance Value: 474 Ohm
Lead Channel Impedance Value: 570 Ohm
Lead Channel Pacing Threshold Amplitude: 0.875 V
Lead Channel Pacing Threshold Amplitude: 1 V
Lead Channel Pacing Threshold Amplitude: 1 V
Lead Channel Pacing Threshold Amplitude: 1 V
Lead Channel Pacing Threshold Pulse Width: 0.4 ms
Lead Channel Pacing Threshold Pulse Width: 0.4 ms
Lead Channel Sensing Intrinsic Amplitude: 2.8 mV
Lead Channel Sensing Intrinsic Amplitude: 4 mV
Lead Channel Setting Pacing Amplitude: 2 V
Lead Channel Setting Pacing Pulse Width: 0.4 ms
Lead Channel Setting Sensing Sensitivity: 1.4 mV
MDC IDC MSMT LEADCHNL RA PACING THRESHOLD PULSEWIDTH: 0.4 ms
MDC IDC MSMT LEADCHNL RV PACING THRESHOLD PULSEWIDTH: 0.4 ms
MDC IDC SET LEADCHNL RV PACING AMPLITUDE: 2.5 V
MDC IDC STAT BRADY AS VS PERCENT: 16 %

## 2018-04-23 MED ORDER — DILTIAZEM HCL ER 120 MG PO CP12: 120.0000 mg | ORAL_CAPSULE | Freq: Every day | ORAL | 3 refills | Status: DC

## 2018-04-23 NOTE — Addendum Note (Signed)
Addended by: Oretha MilchSWANSON, Dasia Guerrier on: 04/23/2018 10:04 AM   Modules accepted: Orders

## 2018-04-23 NOTE — Telephone Encounter (Signed)
New message  Pt c/o medication issue:  1. Name of Medication: Diltiazem  2. How are you currently taking this medication (dosage and times per day)?n/a  3. Are you having a reaction (difficulty breathing--STAT)? No   4. What is your medication issue? Patient's daughter states that this medicine was discussed in the last appt but the prescription was not sent to [facility] [place], [place].

## 2018-04-23 NOTE — Telephone Encounter (Signed)
Per Dr Graciela HusbandsKlein, pt should begin 120mg  Diltiazem qd. Script was sent it. [name] had no additional questions.

## 2018-04-26 ENCOUNTER — Encounter: Payer: Self-pay | Admitting: Internal Medicine

## 2018-04-26 ENCOUNTER — Ambulatory Visit: Payer: Medicare Other | Admitting: Internal Medicine

## 2018-04-26 VITALS — BP 124/80 | HR 80 | Ht 72.0 in | Wt 203.6 lb

## 2018-04-26 DIAGNOSIS — J9611 Chronic respiratory failure with hypoxia: Secondary | ICD-10-CM | POA: Diagnosis not present

## 2018-04-26 DIAGNOSIS — J449 Chronic obstructive pulmonary disease, unspecified: Secondary | ICD-10-CM

## 2018-04-26 DIAGNOSIS — G4733 Obstructive sleep apnea (adult) (pediatric): Secondary | ICD-10-CM | POA: Diagnosis not present

## 2018-04-26 LAB — CBC WITH DIFFERENTIAL/PLATELET
Basophils Absolute: 0.1 10*3/uL (ref 0.0–0.1)
Basophils Relative: 0.8 % (ref 0.0–3.0)
Eosinophils Absolute: 0.4 10*3/uL (ref 0.0–0.7)
Eosinophils Relative: 3.4 % (ref 0.0–5.0)
HCT: 42.4 % (ref 39.0–52.0)
Hemoglobin: 14.2 g/dL (ref 13.0–17.0)
LYMPHS ABS: 2.1 10*3/uL (ref 0.7–4.0)
Lymphocytes Relative: 20.3 % (ref 12.0–46.0)
MCHC: 33.6 g/dL (ref 30.0–36.0)
MCV: 95.1 fl (ref 78.0–100.0)
Monocytes Absolute: 1.3 10*3/uL — ABNORMAL HIGH (ref 0.1–1.0)
Monocytes Relative: 12.1 % — ABNORMAL HIGH (ref 3.0–12.0)
NEUTROS ABS: 6.5 10*3/uL (ref 1.4–7.7)
Neutrophils Relative %: 63.4 % (ref 43.0–77.0)
Platelets: 219 10*3/uL (ref 150.0–400.0)
RBC: 4.45 Mil/uL (ref 4.22–5.81)
RDW: 13.8 % (ref 11.5–15.5)
WBC: 10.3 10*3/uL (ref 4.0–10.5)

## 2018-04-26 MED ORDER — FLUTICASONE-UMECLIDIN-VILANT 100-62.5-25 MCG/INH IN AEPB: 1.0000 | INHALATION_SPRAY | Freq: Every day | RESPIRATORY_TRACT | 0 refills | Status: DC

## 2018-04-26 NOTE — Progress Notes (Signed)
HPI  M former smoker with COPD, chronic cough  complicated by Cardiac dysrhythmia/ pacemaker, HBP  Office Spirometry 08/11/14-moderate obstruction/restriction-FVC 2.95/62%, FEV1 2.37/65%, ratio 0.79, FEF 25-75% 2.43/77% PFT 10/26/2017-normal flows without response to bronchodilator, moderate diffusion defect. Walk Test O2 Qualifying-04/26/2018-qualified for portable oxygen-during his second lap heart rate reached 112 and saturation fell to 86% on room air -----------------------------------------------------------------------------------------  05/27/17- 72 year old male former smoker followed for COPD, restrictive lung disease, cough, complicated by cardiac arrhythmia/pacemaker, HBP, DM ------Pt has productive cough- yellow mucus, post nasal drip, sore throat, and some SOB with exertion. Pt states been feeling like this for 2-3 weeks. Intervals of sinus pressure with postnasal drip and throat tickle cough.  Not much headache or fever. Complains of dyspnea on exertion trying to walk 1/2 mile on treadmill.  Exercises with machines regularly at "Y".  Not wheezing. UTD flu and pvax  04/26/2018- 72 year old male former smoker followed for COPD, restrictive lung disease, cough, complicated by cardiac arrhythmia/pacemaker/ ICD, HBP, DM -----Dyspnea on exertion and COPD: Pt following up due to noted wheezing at cardiology apt with Dr. Graciela HusbandsKlein.  Anoro, Flovent HFA 110, pro-air HFA, neb DuoNeb, Singulair, Xyzal, He considers today an ordinary day.  Few exacerbations but feels a little worse in damp weather.  Using rescue inhaler 4-5 times daily, nebulizer machine at least once or twice per week.  Frequent cough, white or yellow sputum.  Nonexertional, nonradiating left pectoral and left infrascapular pains occasionally. Easy dyspnea on exertion with hills and stairs.  Daughter got him an oximeter and he says when he wakes in the morning scores are low.  He does not know if he snores. Walk Test O2  Qualifying-04/26/2018-qualified for portable oxygen-during his second lap heart rate reached 112 and saturation fell to 86% on room air.  Walk was stopped and he was placed on O2.  He is being sent home with a [employer] portable tank. PFT 10/26/2017-normal flows without response to bronchodilator, moderate diffusion defect. CXR 06/18/2017- IMPRESSION: Left basilar scarring or atelectasis.  ROS-see HPI   + = positive Constitutional:    weight loss, night sweats, fevers, chills, fatigue, lassitude. HEENT:    headaches, difficulty swallowing, tooth/dental problems, sore throat,       sneezing, itching, ear ache, +nasal congestion, +post nasal drip, snoring CV:    +chest pain, orthopnea, PND, swelling in lower extremities, anasarca,                                                         dizziness, palpitations Resp:   +shortness of breath with exertion or at rest.                productive cough,   + non-productive cough, coughing up of blood.              change in color of mucus.  +wheezing.   Skin:    rash or lesions. GI:  No-   heartburn, indigestion, abdominal pain, nausea, vomiting, GU:  MS:   joint pain, stiffness,  Neuro-     nothing unusual Psych:  change in mood or affect.  depression or anxiety.   memory loss.  OBJ- Physical Exam General- Alert, Oriented, Affect-appropriate, Distress- none acute Skin- rash-none, lesions- none, excoriation- none Lymphadenopathy- none Head- atraumatic  Eyes- Gross vision intact, PERRLA, conjunctivae and secretions clear            Ears- Hearing, canals-normal            Nose- Clear, no-Septal dev, mucus, polyps, erosion, perforation             Throat- Mallampati II , mucosa clear , drainage+, tonsils- atrophic, edentulous+, not hoarse Neck- flexible , trachea midline, no stridor , thyroid nl, carotid no bruit Chest - symmetrical excursion , unlabored           Heart/CV- RRR , no murmur , no gallop  , no rub, nl s1 s2                            - JVD- none , edema- none, stasis changes- none, varices- none           Lung- , wheeze+, cough+ light, dullness-none, rub- none,            Chest wall- +L pacemaker Abd-  Br/ Gen/ Rectal- Not done, not indicated Extrem- cyanosis- none, clubbing, none, atrophy- none, strength- nl, +cane Neuro- grossly intact to observation

## 2018-04-26 NOTE — Assessment & Plan Note (Signed)
He has been aware of significant dyspnea trying to climb the hill to his daughter's trailer, but without acute event.  Walk test today demonstrates significant desaturation with exercise.  He also reports low saturation when he wakes in the morning so we are going to check a home sleep test. Plan-start O2 continuous and portable at 2 L, HST

## 2018-04-26 NOTE — Assessment & Plan Note (Signed)
PFTs have not shown significant obstruction, but he is wheezing despite regular use of bronchodilators.  I am going to let him try Trelegy again in comparison with his Anoro/Flovent.  He may need to use his nebulizer machine more.  We will check eosinophil and IgE to see if he might benefit from a biologic.

## 2018-04-26 NOTE — Patient Instructions (Signed)
Order- walk test- O2 qualifying  Order- schedule unattended home sleep test    Dx OSA  Order- lab- CBC w diff, IgE  Sample Trelegy inhaler    Inhale 1 puff, then rinse mouth, once daily    Try this for a week instead of Anoro and Flovent to see if it is any better.

## 2018-04-27 LAB — IGE: IGE (IMMUNOGLOBULIN E), SERUM: 464 kU/L — AB (ref <=114)

## 2018-05-03 ENCOUNTER — Telehealth: Payer: Self-pay | Admitting: Internal Medicine

## 2018-05-03 NOTE — Telephone Encounter (Signed)
Called and spoke with patient, he stated that he is needing a refill for his Trelegy. Refill sent in. He stated that he was given a oxygen tank from us on 04/26/18. Patient stated that he is dropping this off Tuesday morning as [facility] and no one will return his call back. Nothing further needed.

## 2018-05-07 ENCOUNTER — Telehealth: Payer: Self-pay | Admitting: Internal Medicine

## 2018-05-07 MED ORDER — FLUTICASONE-UMECLIDIN-VILANT 100-62.5-25 MCG/INH IN AEPB: 1.0000 | INHALATION_SPRAY | Freq: Every day | RESPIRATORY_TRACT | 6 refills | Status: DC

## 2018-05-07 NOTE — Telephone Encounter (Signed)
Called and spoke with Patient.  He stated that he felt like his breathing had improved, since starting the Trelegy sample given 04/26/18.  He is requesting a prescription be sent to [facility] in [place].  Prescription sent to preferred pharmacy.  Trelegy sample had been given at 04/26/18 OV, no prescriptions sent to pharmacy until today, 05/07/18.   Per CY 04/26/18- Sample Trelegy inhaler    Inhale 1 puff, then rinse mouth, once daily    Try this for a week instead of Anoro and Flovent to see if it is any better.

## 2018-05-17 DIAGNOSIS — G4733 Obstructive sleep apnea (adult) (pediatric): Secondary | ICD-10-CM

## 2018-05-18 LAB — CUP PACEART REMOTE DEVICE CHECK
Battery Remaining Longevity: 125 mo
Battery Voltage: 2.78 V
Brady Statistic AP VP Percent: 1 %
Brady Statistic AP VS Percent: 82 %
Brady Statistic AS VP Percent: 0 %
Date Time Interrogation Session: 20191118234957
Implantable Lead Implant Date: 20070820
Implantable Lead Implant Date: 20070820
Implantable Lead Location: 753859
Implantable Lead Location: 753860
Implantable Lead Model: 4092
Implantable Lead Model: 5076
Implantable Pulse Generator Implant Date: 20170222
Lead Channel Impedance Value: 460 Ohm
Lead Channel Impedance Value: 562 Ohm
Lead Channel Pacing Threshold Amplitude: 0.875 V
Lead Channel Pacing Threshold Amplitude: 1 V
Lead Channel Pacing Threshold Pulse Width: 0.4 ms
Lead Channel Pacing Threshold Pulse Width: 0.4 ms
Lead Channel Setting Pacing Amplitude: 2 V
Lead Channel Setting Pacing Amplitude: 2.5 V
Lead Channel Setting Pacing Pulse Width: 0.4 ms
Lead Channel Setting Sensing Sensitivity: 1.4 mV
MDC IDC MSMT BATTERY IMPEDANCE: 159 Ohm
MDC IDC STAT BRADY AS VS PERCENT: 17 %

## 2018-05-19 ENCOUNTER — Other Ambulatory Visit: Payer: Self-pay | Admitting: *Deleted

## 2018-05-19 DIAGNOSIS — G4733 Obstructive sleep apnea (adult) (pediatric): Secondary | ICD-10-CM

## 2018-05-20 ENCOUNTER — Other Ambulatory Visit: Payer: Self-pay | Admitting: Family Medicine

## 2018-05-20 ENCOUNTER — Ambulatory Visit
Admission: RE | Admit: 2018-05-20 | Discharge: 2018-05-20 | Disposition: A | Discharge status: Home / Self Care | Payer: Medicare Other | Source: Ambulatory Visit | Attending: Family Medicine | Admitting: Family Medicine

## 2018-05-20 DIAGNOSIS — J441 Chronic obstructive pulmonary disease with (acute) exacerbation: Secondary | ICD-10-CM

## 2018-05-20 DIAGNOSIS — G4733 Obstructive sleep apnea (adult) (pediatric): Secondary | ICD-10-CM

## 2018-05-28 ENCOUNTER — Encounter (HOSPITAL_COMMUNITY): Payer: Self-pay

## 2018-05-28 ENCOUNTER — Ambulatory Visit (HOSPITAL_COMMUNITY): Admit: 2018-05-28 | Payer: Medicare Other | Admitting: Internal Medicine

## 2018-05-28 SURGERY — ICD IMPLANT

## 2018-06-07 ENCOUNTER — Telehealth: Payer: Self-pay | Admitting: Internal Medicine

## 2018-06-07 DIAGNOSIS — G4733 Obstructive sleep apnea (adult) (pediatric): Secondary | ICD-10-CM

## 2018-06-07 MED ORDER — FLUTICASONE-UMECLIDIN-VILANT 100-62.5-25 MCG/INH IN AEPB: 1.0000 | INHALATION_SPRAY | Freq: Every day | RESPIRATORY_TRACT | 1 refills | Status: DC

## 2018-06-07 NOTE — Telephone Encounter (Signed)
Called and spoke to pt, who is requesting home sleep study results from 05/17/18.  CY please advise. Thanks

## 2018-06-07 NOTE — Telephone Encounter (Signed)
Home sleep test showed severe obstructive and central sleep apnea, averaging 33 apneas/ hour. Oxygen did drop some in sleep.  I will go over these results with him at next office visit. For now I suggest we start CPAP, which will help protect his heart.  Order- his DME, new CPAP auto 5-20, bleed in O2 2L, mask of choice, humidifier, supplies, [employer]

## 2018-06-07 NOTE — Telephone Encounter (Signed)
Called spoke with patient, discussed Home Sleep Test results/recs as stated by CDY below Patient okay with beginning CPAP therapy Order placed  Patient also mentioned needing a Trelegy Rx sent to [facility] This has been done Nothing further needed at this time; will sign off

## 2018-06-15 ENCOUNTER — Telehealth: Payer: Self-pay | Admitting: Internal Medicine

## 2018-06-15 NOTE — Telephone Encounter (Signed)
Spoke with pt, wants to know how long he will be on O2.  I advised that there is no specific time frame.  I explained that some patients need it temporarily and some need it permanently.  I advised pt that we would continue to monitor his 02 when he comes in for visits for optimal management.  Pt expressed understanding.  Nothing further needed at this time- will close encounter.

## 2018-06-25 ENCOUNTER — Ambulatory Visit (INDEPENDENT_AMBULATORY_CARE_PROVIDER_SITE_OTHER): Payer: Medicare Other

## 2018-06-25 DIAGNOSIS — I495 Sick sinus syndrome: Secondary | ICD-10-CM | POA: Diagnosis not present

## 2018-06-26 LAB — CUP PACEART REMOTE DEVICE CHECK
Battery Impedance: 159 Ohm
Battery Remaining Longevity: 125 mo
Battery Voltage: 2.78 V
Brady Statistic AP VP Percent: 3 %
Brady Statistic AS VP Percent: 0 %
Brady Statistic AS VS Percent: 13 %
Date Time Interrogation Session: 20200221162852
Implantable Lead Implant Date: 20070820
Implantable Lead Implant Date: 20070820
Implantable Lead Location: 753859
Implantable Lead Location: 753860
Implantable Lead Model: 4092
Implantable Pulse Generator Implant Date: 20170222
Lead Channel Impedance Value: 629 Ohm
Lead Channel Pacing Threshold Amplitude: 1 V
Lead Channel Pacing Threshold Pulse Width: 0.4 ms
Lead Channel Setting Pacing Amplitude: 2 V
Lead Channel Setting Pacing Amplitude: 2.5 V
Lead Channel Setting Pacing Pulse Width: 0.4 ms
Lead Channel Setting Sensing Sensitivity: 1.4 mV
MDC IDC MSMT LEADCHNL RA IMPEDANCE VALUE: 480 Ohm
MDC IDC MSMT LEADCHNL RA PACING THRESHOLD AMPLITUDE: 0.875 V
MDC IDC MSMT LEADCHNL RA PACING THRESHOLD PULSEWIDTH: 0.4 ms
MDC IDC STAT BRADY AP VS PERCENT: 84 %

## 2018-07-01 NOTE — Progress Notes (Signed)
Remote pacemaker transmission.   

## 2018-07-02 ENCOUNTER — Encounter: Payer: Self-pay | Admitting: Cardiology

## 2018-07-06 ENCOUNTER — Encounter: Payer: Self-pay | Admitting: Internal Medicine

## 2018-07-09 ENCOUNTER — Telehealth: Payer: Self-pay | Admitting: Internal Medicine

## 2018-07-09 NOTE — Telephone Encounter (Signed)
Spoke with the pt and advised that unfortunately we do not have any trelegy right now  I offered to send rx for this and he declined, stating that he is waiting [facility] to be mailed already  Nothing further needed

## 2018-07-26 ENCOUNTER — Telehealth: Payer: Self-pay | Admitting: Internal Medicine

## 2018-07-26 MED ORDER — FLUTICASONE-UMECLIDIN-VILANT 100-62.5-25 MCG/INH IN AEPB: 1.0000 | INHALATION_SPRAY | Freq: Every day | RESPIRATORY_TRACT | 1 refills | Status: DC

## 2018-07-26 NOTE — Telephone Encounter (Signed)
Left message informing patient refill has been sent in. Nothing further is needed at this time.  

## 2018-07-27 ENCOUNTER — Ambulatory Visit: Payer: Medicare Other | Admitting: Internal Medicine

## 2018-07-28 ENCOUNTER — Telehealth: Payer: Self-pay | Admitting: Internal Medicine

## 2018-07-28 NOTE — Telephone Encounter (Signed)
Ok to set up televisit with APP to address CPAP and O2.

## 2018-07-28 NOTE — Telephone Encounter (Signed)
ATC pt, no answer. Left message for pt to call back.  

## 2018-07-28 NOTE — Telephone Encounter (Signed)
CY please advise, patients 91 days is up on 09/03/18 he has an appointment pushed out until July right now. Would it be ok to place patient on the schedule for a televisit before 09/03/18 please advise if so with you or an APP. Thank you.

## 2018-07-29 ENCOUNTER — Telehealth: Payer: Medicare Other | Admitting: Nurse Practitioner

## 2018-07-29 NOTE — Telephone Encounter (Signed)
LMTCB x2 for pt 

## 2018-07-30 ENCOUNTER — Other Ambulatory Visit: Payer: Self-pay

## 2018-07-30 ENCOUNTER — Encounter: Payer: Self-pay | Admitting: Nurse Practitioner

## 2018-07-30 ENCOUNTER — Ambulatory Visit (INDEPENDENT_AMBULATORY_CARE_PROVIDER_SITE_OTHER): Payer: Medicare Other | Admitting: Nurse Practitioner

## 2018-07-30 DIAGNOSIS — G4733 Obstructive sleep apnea (adult) (pediatric): Secondary | ICD-10-CM

## 2018-07-30 NOTE — Progress Notes (Signed)
Virtual Visit via Telephone Note  I connected with [name] [name] on 07/30/18 at  9:00 AM EDT by telephone and verified that I am speaking with the correct person using two identifiers.   I discussed the limitations, risks, security and privacy concerns of performing an evaluation and management service by telephone and the availability of in person appointments. I also discussed with the patient that there may be a patient responsible charge related to this service. The patient expressed understanding and agreed to proceed.   History of Present Illness: 73 year old male former smoker with COPD, chronic cough complicated by cardiac dysrhythmia/pacemaker, HTN who is followed by Dr. Maple Hudson.  Patient has a tele-visit today for follow-up on OSA/CPAP.  Patient was started on CPAP in February after sleep study showed severe sleep apnea.  He states that he has been wearing his CPAP nightly.  He states that he does benefit from wearing his CPAP and feels much less drowsy during the day.  He wears a full facemask and states that he is getting used to it.  He states that he does not need to use a bath at this time.  He states that he sleeps 4 to 5 hours nightly and has to wake up to use the restroom and then when he goes back to sleep he has not been putting his CPAP back on. Denies f/c/s, n/v/d, hemoptysis, PND, leg swelling.    Observations/Objective:  HST 05/17/18>> Severe obstructive sleep apnea AHI:33  CPAP compliance report 06/28/18 - 07/27/18 - usage days 30/30 (100%), average usage 4 hours 55 minutes, CPAP AutoSet 5-20 cmH20, AHI 1.8  Assessment and Plan: Patient has a tele-visit today for follow-up on OSA/CPAP.  Patient was started on CPAP in February after sleep study showed severe sleep apnea.  He states that he has been wearing his CPAP nightly.  He states that he does benefit from wearing his CPAP and feels much less drowsy during the day.  He wears a full facemask and states that he is getting  used to it.  He states that he does not need to use a bath at this time.  He states that he sleeps 4 to 5 hours nightly and has to wake up to use the restroom and then when he goes back to sleep he has not been putting his CPAP back on.   Patient continues to benefit from CPAP with good compliance and control documented Continue CPAP at current settings Continue current medications Goal of 4 hours or more usage per night - put CPAP back on after waking up at night Maintain healthy weight Do not drive if drowsy   Follow Up Instructions:   Follow up with Dr. Maple Hudson in 6 months or sooner if needed  I discussed the assessment and treatment plan with the patient. The patient was provided an opportunity to ask questions and all were answered. The patient agreed with the plan and demonstrated an understanding of the instructions.   The patient was advised to call back or seek an in-person evaluation if the symptoms worsen or if the condition fails to improve as anticipated.  I provided 22 minutes of non-face-to-face time during this encounter.   Ivonne Andrew, NP

## 2018-07-30 NOTE — Patient Instructions (Signed)
Patient continues to benefit from CPAP with good compliance and control documented Continue CPAP at current settings Continue current medications Goal of 4 hours or more usage per night - put CPAP back on after waking up at night Maintain healthy weight Do not drive if drowsy Follow up with Dr. Maple Hudson in 6 months or sooner if needed

## 2018-07-30 NOTE — Assessment & Plan Note (Signed)
Patient has a tele-visit today for follow-up on OSA/CPAP.  Patient was started on CPAP in February after sleep study showed severe sleep apnea.  He states that he has been wearing his CPAP nightly.  He states that he does benefit from wearing his CPAP and feels much less drowsy during the day.  He wears a full facemask and states that he is getting used to it.  He states that he does not need to use a bath at this time.  He states that he sleeps 4 to 5 hours nightly and has to wake up to use the restroom and then when he goes back to sleep he has not been putting his CPAP back on.   Patient continues to benefit from CPAP with good compliance and control documented Continue CPAP at current settings Continue current medications Goal of 4 hours or more usage per night - put CPAP back on after waking up at night Maintain healthy weight Do not drive if drowsy Follow up with Dr. Maple Hudson in 6 months or sooner if needed

## 2018-07-30 NOTE — Telephone Encounter (Signed)
Spoke with pt. He had already been scheduled for a televisit today with Tonya. Nothing further was needed.

## 2018-08-20 ENCOUNTER — Telehealth: Payer: Self-pay | Admitting: Internal Medicine

## 2018-08-20 NOTE — Telephone Encounter (Signed)
New Message:    Patient daughter calling concerning her her father BP going low sometime please call patient back.

## 2018-08-20 NOTE — Telephone Encounter (Signed)
Spoke with pt's daughter today. She states her and her dad have noticed a drop in his HR recently down to 39-40's. She believes his PPM setting had a lower limit of 60bpm. He is also approaching ERI per his last PPM remote check.  He is asymptomatic and his SBPs remain in the 130's-140's. Pt's remote transmission device is currently not at his house, but at his daughter's house (20 minutes away) where there is an Internet connection.   Per [name] Balsam, NP, pt may send a remote transmission in this weekend which will be reviewed.  [name] has agreed to send in a remote for review and understands we will call her on Monday to discuss. She understands if pt becomes symptomatic, ie dizzy, SOB, fatigued, light headed, he should report to [facility] ED for evaluation as COVID pts will not be housed there currently.   She has verbalized instructions.

## 2018-08-21 NOTE — Telephone Encounter (Signed)
Remote check reviewed. Normal device function.

## 2018-08-23 NOTE — Telephone Encounter (Signed)
Spoke w/ pt daughter. Informed her of Gypsy Balsam, NP results of remote check. Informed her of patient battery life of 9 years. Pt daughter stated that they were worried about the HR being in the 40's but they were checking it w/ the pulse ox. I informed her that the pulse ox or BP cuff can give incorrect readings and that we recommended patient feel his radial pulse and count for 1 minute. Pt daughter verbalized understanding.

## 2018-08-23 NOTE — Telephone Encounter (Signed)
Follow Up:   Pt was supposed to send transmission on Saturday and call back today.

## 2018-09-01 ENCOUNTER — Telehealth: Payer: Self-pay | Admitting: Internal Medicine

## 2018-09-01 NOTE — Telephone Encounter (Signed)
Returned call to patient.  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  Patient states letter can be simple with no specifics required other than confirming use of CPAP and oxygen.    Will route to Dr. Maple Hudson for approval.    [name] -televisit with [name], NP on 07/30/18   Patient continues to benefit from CPAP with good compliance and control documented Continue CPAP at current settings Continue current medications Goal of 4 hours or more usage per night - put CPAP back on after waking up at night Maintain healthy weight Do not drive if drowsy Follow up with Dr. Maple Hudson in 6 months or sooner if needed  LOV Dr. Maple Hudson 04/26/18  Order- walk test- O2 qualifying Order- schedule unattended home sleep test    Dx OSA Order- lab- CBC w diff, IgE Sample Trelegy inhaler    Inhale 1 puff, then rinse mouth, once daily    Try this for a week instead of Anoro and Flovent to see if it is any better.  Dr. Maple Hudson please advise on letter.  Thank you

## 2018-09-03 ENCOUNTER — Encounter: Payer: Self-pay | Admitting: Internal Medicine

## 2018-09-03 NOTE — Telephone Encounter (Signed)
done

## 2018-09-06 NOTE — Telephone Encounter (Signed)
LMTCB x1 for pt.  

## 2018-09-06 NOTE — Telephone Encounter (Signed)
Patient returning phone call.  Patient phone number is [phone].

## 2018-09-06 NOTE — Telephone Encounter (Signed)
Called and spoke with patient letting him aware the letter was created, signed and mailed on Friday. Pt verbalized and expressed understanding Nothing further needed.

## 2018-09-08 ENCOUNTER — Telehealth: Payer: Self-pay | Admitting: Nurse Practitioner

## 2018-09-08 NOTE — Telephone Encounter (Signed)
Pt's OV notes from televisit 3/27 with TN has been faxed to [place] at [facility] to the provided fax number. Nothing further needed.

## 2018-09-20 ENCOUNTER — Other Ambulatory Visit: Payer: Self-pay

## 2018-09-20 ENCOUNTER — Ambulatory Visit (INDEPENDENT_AMBULATORY_CARE_PROVIDER_SITE_OTHER): Payer: Medicare Other | Admitting: Internal Medicine

## 2018-09-20 DIAGNOSIS — I495 Sick sinus syndrome: Secondary | ICD-10-CM

## 2018-09-20 DIAGNOSIS — J9611 Chronic respiratory failure with hypoxia: Secondary | ICD-10-CM | POA: Diagnosis not present

## 2018-09-20 DIAGNOSIS — G4733 Obstructive sleep apnea (adult) (pediatric): Secondary | ICD-10-CM | POA: Diagnosis not present

## 2018-09-20 DIAGNOSIS — J449 Chronic obstructive pulmonary disease, unspecified: Secondary | ICD-10-CM

## 2018-09-20 DIAGNOSIS — I739 Peripheral vascular disease, unspecified: Secondary | ICD-10-CM

## 2018-09-20 MED ORDER — PREDNISONE 10 MG PO TABS: ORAL_TABLET | ORAL | 0 refills | Status: DC

## 2018-09-20 NOTE — Progress Notes (Signed)
HPI  M former smoker with COPD, chronic cough  complicated by Cardiac dysrhythmia/ pacemaker, HBP  Office Spirometry 08/11/14-moderate obstruction/restriction-FVC 2.95/62%, FEV1 2.37/65%, ratio 0.79, FEF 25-75% 2.43/77% PFT 10/26/2017-normal flows without response to bronchodilator, moderate diffusion defect. Walk Test O2 Qualifying-04/26/2018-qualified for portable oxygen-during his second lap heart rate reached 112 and saturation fell to 86% on room air Labs 04/26/2018- IgE 464, EOS wnl -----------------------------------------------------------------------------------------   04/26/2018- 73 year old male former smoker followed for COPD, restrictive lung disease, cough, complicated by cardiac arrhythmia/pacemaker/ ICD, HBP, DM -----Dyspnea on exertion and COPD: Pt following up due to noted wheezing at cardiology apt with Dr. Graciela HusbandsKlein.  Anoro, Flovent HFA 110, pro-air HFA, neb DuoNeb, Singulair, Xyzal, He considers today an ordinary day.  Few exacerbations but feels a little worse in damp weather.  Using rescue inhaler 4-5 times daily, nebulizer machine at least once or twice per week.  Frequent cough, white or yellow sputum.  Nonexertional, nonradiating left pectoral and left infrascapular pains occasionally. Easy dyspnea on exertion with hills and stairs.  Daughter got him an oximeter and he says when he wakes in the morning scores are low.  He does not know if he snores. Walk Test O2 Qualifying-04/26/2018-qualified for portable oxygen-during his second lap heart rate reached 112 and saturation fell to 86% on room air.  Walk was stopped and he was placed on O2.  He is being sent home with a [employer] portable tank. PFT 10/26/2017-normal flows without response to bronchodilator, moderate diffusion defect. CXR 06/18/2017- IMPRESSION: Left basilar scarring or atelectasis.   ROS-see HPI   + = positive Constitutional:    weight loss, night sweats, fevers, chills, fatigue, lassitude. HEENT:    headaches,  difficulty swallowing, tooth/dental problems, sore throat,       sneezing, itching, ear ache, +nasal congestion, +post nasal drip, snoring CV:    +chest pain, orthopnea, PND, swelling in lower extremities, anasarca,                                                         dizziness, palpitations Resp:   +shortness of breath with exertion or at rest.                productive cough,   + non-productive cough, coughing up of blood.              change in color of mucus.  +wheezing.   Skin:    rash or lesions. GI:  No-   heartburn, indigestion, abdominal pain, nausea, vomiting, GU:  MS:   joint pain, stiffness,  Neuro-     nothing unusual Psych:  change in mood or affect.  depression or anxiety.   memory loss.  OBJ- Physical Exam General- Alert, Oriented, Affect-appropriate, Distress- none acute Skin- rash-none, lesions- none, excoriation- none Lymphadenopathy- none Head- atraumatic            Eyes- Gross vision intact, PERRLA, conjunctivae and secretions clear            Ears- Hearing, canals-normal            Nose- Clear, no-Septal dev, mucus, polyps, erosion, perforation             Throat- Mallampati II , mucosa clear , drainage+, tonsils- atrophic, edentulous+, not hoarse Neck- flexible , trachea midline, no stridor , thyroid nl,  carotid no bruit Chest - symmetrical excursion , unlabored           Heart/CV- RRR , no murmur , no gallop  , no rub, nl s1 s2                           - JVD- none , edema- none, stasis changes- none, varices- none           Lung- , wheeze+, cough+ light, dullness-none, rub- none,            Chest wall- +L pacemaker Abd-  Br/ Gen/ Rectal- Not done, not indicated Extrem- cyanosis- none, clubbing, none, atrophy- none, strength- nl, +cane Neuro- grossly intact to observation  09/20/2018- Virtual Visit via Telephone Note  I connected with [name] [name] on 09/20/18 at  9:30 AM EDT by telephone and verified that I am speaking with the correct person using two  identifiers.  Location: Patient:home Provider: office   I discussed the limitations, risks, security and privacy concerns of performing an evaluation and management service by telephone and the availability of in person appointments. I also discussed with the patient that there may be a patient responsible charge related to this service. The patient expressed understanding and agreed to proceed.   History of Present Illness: 09/20/2018- 73 year old male former smoker followed for COPD, restrictive lung disease, cough, OSA, complicated by cardiac arrhythmia/pacemaker/ ICD, HBP, DM HST 05/17/18>> Severe obstructive sleep apnea AHI:33 Had televisit w NP 3/27- for OSA/ CPAP f/u CPAP auto 5-20/ [facility] 04/26/2018- IgE 464, EOS wnl O2 2L-  Sleep and prn/ [facility] PFT in 10/2017 had shown normal flows but low DLCO, and he has wheezed on our exams. He tried Trelegy in comparison with Anoro/ Flovent. We wondered about a biologic agent.  -----OSA on CPAP; using every night, no issues w/ pressure settings/mask, states breathing and stamina feelings like it is getting worse, on O2 2L as needed  Doesn't notice benefit from inhalers.  Family shared a viral syndrome last winter. They got better but he did not- only prednisone has seemed to help.  Observations/Objective: CXR 05/20/2018- No acute abnormality noted. Download 97% compliance, AHI 1.8/ hr  Assessment and Plan:   Follow Up Instructions:    I discussed the assessment and treatment plan with the patient. The patient was provided an opportunity to ask questions and all were answered. The patient agreed with the plan and demonstrated an understanding of the instructions.   The patient was advised to call back or seek an in-person evaluation if the symptoms worsen or if the condition fails to improve as anticipated.  I provided 21 minutes of non-face-to-face time during this encounter.   Jetty Duhamel, MD

## 2018-09-20 NOTE — Patient Instructions (Signed)
Continue CPAP auto 5-20/ [facility]   Continue O2 2L for sleep and as needed/ [facility]  Script sent to try a maintenance steroid strategy with prednisone. This will raise your blood sugar.  We are looking into a different kind of therapy directed at an allergy/ asthma process, to see if it might help. We will need to give you more information about that later.  Please call if we can help

## 2018-09-24 ENCOUNTER — Ambulatory Visit (INDEPENDENT_AMBULATORY_CARE_PROVIDER_SITE_OTHER): Payer: Medicare Other | Admitting: *Deleted

## 2018-09-24 DIAGNOSIS — I495 Sick sinus syndrome: Secondary | ICD-10-CM

## 2018-09-25 LAB — CUP PACEART REMOTE DEVICE CHECK
Battery Impedance: 207 Ohm
Battery Remaining Longevity: 117 mo
Battery Voltage: 2.79 V
Brady Statistic AP VP Percent: 4 %
Brady Statistic AP VS Percent: 81 %
Brady Statistic AS VP Percent: 0 %
Brady Statistic AS VS Percent: 15 %
Date Time Interrogation Session: 20200522124726
Implantable Lead Implant Date: 20070820
Implantable Lead Implant Date: 20070820
Implantable Lead Location: 753859
Implantable Lead Location: 753860
Implantable Lead Model: 4092
Implantable Lead Model: 5076
Implantable Pulse Generator Implant Date: 20170222
Lead Channel Impedance Value: 494 Ohm
Lead Channel Impedance Value: 599 Ohm
Lead Channel Pacing Threshold Amplitude: 0.875 V
Lead Channel Pacing Threshold Amplitude: 1.125 V
Lead Channel Pacing Threshold Pulse Width: 0.4 ms
Lead Channel Pacing Threshold Pulse Width: 0.4 ms
Lead Channel Setting Pacing Amplitude: 2 V
Lead Channel Setting Pacing Amplitude: 2.5 V
Lead Channel Setting Pacing Pulse Width: 0.4 ms
Lead Channel Setting Sensing Sensitivity: 1.4 mV

## 2018-10-05 ENCOUNTER — Encounter: Payer: Self-pay | Admitting: Cardiology

## 2018-10-05 NOTE — Progress Notes (Signed)
Remote pacemaker transmission.   

## 2018-11-01 ENCOUNTER — Telehealth: Payer: Self-pay | Admitting: Internal Medicine

## 2018-11-01 NOTE — Telephone Encounter (Signed)
Returned call to patient's daughter Shirlean Mylar. Per original call patient has d/c prednisone. Daughter wants to know if plan is to continue prednisone?  Dr. Annamaria Boots is the plan to stay on maintenance dose prednisone for now?   Editor: Deneise Lever, MD (Physician)    Continue CPAP auto 5-20/ [facility]   Continue O2 2L for sleep and as needed/ [facility]  Script sent to try a maintenance steroid strategy with prednisone. This will raise your blood sugar.  We are looking into a different kind of therapy directed at an allergy/ asthma process, to see if it might help. We will need to give you more information about that later.  Please call if we can help

## 2018-11-02 ENCOUNTER — Ambulatory Visit: Payer: Medicare Other | Admitting: Internal Medicine

## 2018-11-02 MED ORDER — PREDNISONE 10 MG PO TABS: ORAL_TABLET | ORAL | 1 refills | Status: DC

## 2018-11-02 NOTE — Telephone Encounter (Signed)
Will route to Dr. Annamaria Boots and Annie Paras, LPN to review for appointment recommendation.  Next scheduled appointment with CY is 01/27/19.    Dr. Annamaria Boots -  This patient had an appointment with you 11/02/18 but was rescheduled.  Wanted to discuss possible biologic treatment you had mentioned.  Your appointment schedules are booked.  Do you want this patient seen sooner?  Daughter had asked [place] to call her back and let her know when you returned from vacation.  Thank you

## 2018-11-02 NOTE — Telephone Encounter (Signed)
Patient should be taking 10mg  every other day. Is he doing this currently? I am fine refilling this and an as needed taper he can take if symptoms exacerbate.

## 2018-11-02 NOTE — Telephone Encounter (Signed)
Routed to B. Volanda Napoleon, NP - Beth this message was sent to Dr. Annamaria Boots yesterday but he didn't respond.  Original message attached. Requesting refill of prednisone maintenance until we can get appt with Dr. Annamaria Boots.  Patient has been off prednisone for 1 week. Please advise. Thanks  Returned call to patient's daughter Shirlean Mylar.  Advised her message was routed to Dr. Annamaria Boots late yesterday afternoon and he is now on vacation and did not see the message.  Message can be routed to another provider for follow up.  Inquired regarding previous prednisone order.  Daughter states patient took the prednisone prescribed in May, as directed, and it was their understanding patient would continue on a maintenance prednisone.  Last prescription was for Prednisone 10mg  every other day once taper completed.   Daughter states this made a 'tremendous difference' and her father (patient) was outside more, working his garden and seemed much improved with breathing.  Daughter thought they had return visit today with Dr. Annamaria Boots but appt was changed she said.  Now no appt until September but she feels her father needs sooner appt to discuss possible biologic injection that Dr. Annamaria Boots had mentioned.    Requesting refill on the prednisone to [facility] so patient can continue this until she can get an appt with Dr. Annamaria Boots.  Advised patient I could route message to provider on refill and would call her back to confirm this and make appointment for follow up.   Offered visit with another provider this week if needed but she would rather see Dr. Annamaria Boots.    predniSONE (DELTASONE) 10 MG tablet          Summary: 3 x 2 days, 2 x 2 days, 1 x 2 days, then one every other day, Normal  Start: 09/20/2018 Ord/Sold: 09/20/2018 (O) Report Taking:  Long-term:  Pharmacy: [facility], [place] [address] Med Dose History Change     Patient Sig: 3 x 2 days, 2 x 2 days, 1 x 2 days, then one every other day     Ordered on:  09/20/2018     Authorized by: Baird Lyons D     Dispense: 50 tablet

## 2018-11-02 NOTE — Telephone Encounter (Signed)
Spoke to Shirlean Mylar [facility]).  She thought pt was taking 10mg  prednisone every other day but will make sure.  Informed Robin script was sent to preferred pharmacy and to start on taper if pt is wheezing.  3 tabs for 2 days, then2 tabs for 2 then 1 tab for 2 then 1 tab every other day.  Shirlean Mylar stated understanding.  Nothing further was needed.

## 2018-11-03 NOTE — Telephone Encounter (Signed)
Woody- see if you can get him into a held spot after I get back.

## 2018-11-05 DIAGNOSIS — I739 Peripheral vascular disease, unspecified: Secondary | ICD-10-CM | POA: Insufficient documentation

## 2018-11-05 NOTE — Assessment & Plan Note (Addendum)
Active wheeze with little response to bronchodilator. Responsive to prednisone.  Plan- pred taper to 10 mg every other day for one month. Discussed Biologic agent aimed at IgE

## 2018-11-05 NOTE — Assessment & Plan Note (Signed)
He continues to benefit from O2 during sleep and exertion, though he tries to get by without it during the day- discussed in context of pulmonary and cardiac issues.  Plan- continue O2 2L

## 2018-11-05 NOTE — Assessment & Plan Note (Signed)
Continues to benefit from CPAP with good compliance and control Plan- Continue auto 5-20

## 2018-11-06 ENCOUNTER — Encounter: Payer: Self-pay | Admitting: Internal Medicine

## 2018-11-08 NOTE — Telephone Encounter (Signed)
Called & spoke w/ pt daughter, Shirlean Mylar, to offer an appt for pt tomorrow 11/09/2018 at 11:30 AM. Pt daughter agreed to scheduling this appt and stated she & pt would prefer a telephone visit. I verified the phone # to call: [phone] and let her know she will receive a call tomorrow at 11:30 AM for pt televisit appt. Pt daughter verbalized understanding.   Appt has been scheduled. Nothing further needed at this time.

## 2018-11-08 NOTE — Telephone Encounter (Signed)
ATC Shirlean Mylar (pt daughter) and/or pt to set up an appt per CY. LMTCB. Will plan on offering pt an appointment for tomorrow 11/09/2018 at 11:30 AM or later this week depending on what works well for pt. Will keep encounter open to f/u on should pt or pt daughter call back.

## 2018-11-09 ENCOUNTER — Encounter: Payer: Self-pay | Admitting: Internal Medicine

## 2018-11-09 ENCOUNTER — Other Ambulatory Visit: Payer: Self-pay

## 2018-11-09 ENCOUNTER — Ambulatory Visit (INDEPENDENT_AMBULATORY_CARE_PROVIDER_SITE_OTHER): Payer: Medicare Other | Admitting: Internal Medicine

## 2018-11-09 DIAGNOSIS — J9611 Chronic respiratory failure with hypoxia: Secondary | ICD-10-CM

## 2018-11-09 DIAGNOSIS — J449 Chronic obstructive pulmonary disease, unspecified: Secondary | ICD-10-CM

## 2018-11-09 DIAGNOSIS — G4733 Obstructive sleep apnea (adult) (pediatric): Secondary | ICD-10-CM | POA: Diagnosis not present

## 2018-11-09 NOTE — Assessment & Plan Note (Signed)
This is mostly an asthma pattern with wheeze, IgE 464. Discussed relative merits and side effects of low dose alternate day prednisone vs a Biologic agent like Xolair or Dupixent and decide to stay w prednisone for now. He and family had a significant viral syndrome illness during the winter which has resolved.

## 2018-11-09 NOTE — Patient Instructions (Signed)
Keep Sept 24 appointment- Please call sooner if needed  Ok to continue present meds for now, including prednisone 10 mg every other day.  We will keep in mind the option to change from prednisone to one of the newer "Biologic" agents, like Xolair or Dupixent.

## 2018-11-09 NOTE — Assessment & Plan Note (Signed)
He is benefiting from CPAP with improved sleep. Stopped for a few days to ease sore mouth- unrelated- ? Thrush, now resolved. Plan- continue auto 5-20 w O2

## 2018-11-09 NOTE — Assessment & Plan Note (Signed)
O2 benefits during sleep- continuing 2L

## 2018-11-09 NOTE — Progress Notes (Signed)
HPI  M former smoker with COPD, chronic cough  complicated by Cardiac dysrhythmia/ pacemaker, HBP  Office Spirometry 08/11/14-moderate obstruction/restriction-FVC 2.95/62%, FEV1 2.37/65%, ratio 0.79, FEF 25-75% 2.43/77% PFT 10/26/2017-normal flows without response to bronchodilator, moderate diffusion defect. Walk Test O2 Qualifying-04/26/2018-qualified for portable oxygen-during his second lap heart rate reached 112 and saturation fell to 86% on room air Labs 04/26/2018- IgE 464, EOS wnl -----------------------------------------------------------------------------------------  09/20/2018- Virtual Visit via Telephone Note  I connected with [name] [name] on 09/20/18 at  9:30 AM EDT by telephone and verified that I am speaking with the correct person using two identifiers.  Location: Patient:home Provider: office   I discussed the limitations, risks, security and privacy concerns of performing an evaluation and management service by telephone and the availability of in person appointments. I also discussed with the patient that there may be a patient responsible charge related to this service. The patient expressed understanding and agreed to proceed.   History of Present Illness: 09/20/2018- 73 year old male former smoker followed for COPD, restrictive lung disease, cough, OSA, complicated by cardiac arrhythmia/pacemaker/ ICD, HBP, DM HST 05/17/18>> Severe obstructive sleep apnea AHI:33 Had televisit w NP 3/27- for OSA/ CPAP f/u [facility] 04/26/2018- IgE 464, EOS wnl O2 2L-  Sleep and prn/ [facility] PFT in 10/2017 had shown normal flows but low DLCO, and he has wheezed on our exams. He tried Trelegy in comparison with Anoro/ Flovent. We wondered about a biologic agent.  -----OSA on CPAP; using every night, no issues w/ pressure settings/mask, states breathing and stamina feelings like it is getting worse, on O2 2L as needed  Doesn't notice benefit from inhalers.   Family shared a viral syndrome last winter. They got better but he did not- only prednisone has seemed to help.  Observations/Objective: CXR 05/20/2018- No acute abnormality noted. Download 97% compliance, AHI 1.8/ hr  Assessment and Plan:   Follow Up Instructions:    I discussed the assessment and treatment plan with the patient. The patient was provided an opportunity to ask questions and all were answered. The patient agreed with the plan and demonstrated an understanding of the instructions.   The patient was advised to call back or seek an in-person evaluation if the symptoms worsen or if the condition fails to improve as anticipated.  I provided 21 minutes of non-face-to-face time during this encounter.   Jetty Duhamellinton Shalandria Elsbernd, MD    11/09/2018- Virtual Visit via Telephone Note  I connected with [name] [name] on 11/09/18 at 11:30 AM EDT by telephone and verified that I am speaking with the correct person using two identifiers.  Location: Patient: H Provider:O   I discussed the limitations, risks, security and privacy concerns of performing an evaluation and management service by telephone and the availability of in person appointments. I also discussed with the patient that there may be a patient responsible charge related to this service. The patient expressed understanding and agreed to proceed.   History of Present Illness:  73 year old male former smoker followed for COPD, restrictive lung disease, cough, OSA, complicated by cardiac arrhythmia/pacemaker/ ICD, HBP, DM HST 05/17/18>> Severe obstructive sleep apnea AHI:33 [facility] 04/26/2018- IgE 464, EOS wnl O2  2L-  Sleep and prn/ [facility] PFT in 10/2017 had shown normal flows but low DLCO, and he has wheezed on our exams. He tried Trelegy in comparison with Anoro/ Flovent. We considered a biologic agent. Asthma/ Dyspnea- Daughter also on hone today. She has been  helping with shopping etc to  maintain his Covid distancing. He has been active with mowing/ yard and garden work. Has felt much more stable on prednisone 10 mg qod. Discussed steroid side effects not likely to be severe at this dose. We agreed to continue wile stable, saving Xolair or Dupixent option for later OSA- he skipped CPAP for several days to see if it was causing sore mouth (thrush?) but back to regular use w/o problems. Sleeps better w CPAP+O2.[name].  Observations/Objective: Download compliance 67%, AHI 2.2/ hr- not recorded as used after 10/27/18. He reports restart after hiatus due to sore mouth.   Assessment and Plan: Asthma/ Dyspnea- Prednisone sufficient at 10 mg qod. Consider Xolair/ Dupixent later. OSA- Benefits from CPAP. Continue auto 5-20 with O2 2L. Follow Up Instructions: Keep Sept 24 appointment   I discussed the assessment and treatment plan with the patient. The patient was provided an opportunity to ask questions and all were answered. The patient agreed with the plan and demonstrated an understanding of the instructions.   The patient was advised to call back or seek an in-person evaluation if the symptoms worsen or if the condition fails to improve as anticipated.  I provided 21 minutes of non-face-to-face time during this encounter.   Jetty Duhamellinton Angelus Hoopes, MD   ROS-see HPI   + = positive Constitutional:    weight loss, night sweats, fevers, chills, fatigue, lassitude. HEENT:    headaches, difficulty swallowing, tooth/dental problems, sore throat,       sneezing, itching, ear ache, +nasal congestion, +post nasal drip, snoring CV:    +chest pain, orthopnea, PND, swelling in lower extremities, anasarca,                                                         dizziness, palpitations Resp:   +shortness of breath with exertion or at rest.                productive cough,   + non-productive cough, coughing up of blood.              change in color of mucus.  +wheezing.   Skin:    rash or lesions. GI:   No-   heartburn, indigestion, abdominal pain, nausea, vomiting, GU:  MS:   joint pain, stiffness,  Neuro-     nothing unusual Psych:  change in mood or affect.  depression or anxiety.   memory loss.  OBJ- Physical Exam General- Alert, Oriented, Affect-appropriate, Distress- none acute Skin- rash-none, lesions- none, excoriation- none Lymphadenopathy- none Head- atraumatic            Eyes- Gross vision intact, PERRLA, conjunctivae and secretions clear            Ears- Hearing, canals-normal            Nose- Clear, no-Septal dev, mucus, polyps, erosion, perforation             Throat- Mallampati II , mucosa clear , drainage+, tonsils- atrophic, edentulous+, not hoarse Neck- flexible , trachea midline, no stridor , thyroid nl, carotid no bruit Chest - symmetrical excursion , unlabored           Heart/CV- RRR , no murmur , no gallop  , no rub, nl s1 s2                           -  JVD- none , edema- none, stasis changes- none, varices- none           Lung- , wheeze+, cough+ light, dullness-none, rub- none,            Chest wall- +L pacemaker Abd-  Br/ Gen/ Rectal- Not done, not indicated Extrem- cyanosis- none, clubbing, none, atrophy- none, strength- nl, +cane Neuro- grossly intact to observation

## 2018-12-24 ENCOUNTER — Encounter: Payer: Medicare Other | Admitting: *Deleted

## 2018-12-28 ENCOUNTER — Other Ambulatory Visit: Payer: Self-pay | Admitting: Internal Medicine

## 2018-12-31 ENCOUNTER — Encounter: Payer: Self-pay | Admitting: Cardiology

## 2019-01-03 ENCOUNTER — Ambulatory Visit (INDEPENDENT_AMBULATORY_CARE_PROVIDER_SITE_OTHER): Payer: Medicare Other | Admitting: *Deleted

## 2019-01-03 DIAGNOSIS — I495 Sick sinus syndrome: Secondary | ICD-10-CM

## 2019-01-03 LAB — CUP PACEART REMOTE DEVICE CHECK
Battery Impedance: 207 Ohm
Battery Remaining Longevity: 116 mo
Battery Voltage: 2.78 V
Brady Statistic AP VP Percent: 4 %
Brady Statistic AP VS Percent: 81 %
Brady Statistic AS VP Percent: 0 %
Brady Statistic AS VS Percent: 15 %
Date Time Interrogation Session: 20200831083059
Implantable Lead Implant Date: 20070820
Implantable Lead Implant Date: 20070820
Implantable Lead Location: 753859
Implantable Lead Location: 753860
Implantable Lead Model: 4092
Implantable Lead Model: 5076
Implantable Pulse Generator Implant Date: 20170222
Lead Channel Impedance Value: 460 Ohm
Lead Channel Impedance Value: 547 Ohm
Lead Channel Pacing Threshold Amplitude: 0.75 V
Lead Channel Pacing Threshold Amplitude: 0.875 V
Lead Channel Pacing Threshold Pulse Width: 0.4 ms
Lead Channel Pacing Threshold Pulse Width: 0.4 ms
Lead Channel Setting Pacing Amplitude: 2 V
Lead Channel Setting Pacing Amplitude: 2.5 V
Lead Channel Setting Pacing Pulse Width: 0.4 ms
Lead Channel Setting Sensing Sensitivity: 1.4 mV

## 2019-01-12 ENCOUNTER — Encounter: Payer: Self-pay | Admitting: Cardiology

## 2019-01-12 NOTE — Progress Notes (Signed)
Remote pacemaker transmission.   

## 2019-01-27 ENCOUNTER — Telehealth: Payer: Self-pay | Admitting: Internal Medicine

## 2019-01-27 ENCOUNTER — Ambulatory Visit: Payer: Medicare Other | Admitting: Internal Medicine

## 2019-01-27 ENCOUNTER — Encounter: Payer: Self-pay | Admitting: Internal Medicine

## 2019-01-27 ENCOUNTER — Other Ambulatory Visit: Payer: Self-pay

## 2019-01-27 VITALS — BP 140/94 | HR 66 | Temp 97.2°F | Ht 72.0 in | Wt 218.0 lb

## 2019-01-27 DIAGNOSIS — J449 Chronic obstructive pulmonary disease, unspecified: Secondary | ICD-10-CM | POA: Diagnosis not present

## 2019-01-27 DIAGNOSIS — Z23 Encounter for immunization: Secondary | ICD-10-CM

## 2019-01-27 DIAGNOSIS — G4733 Obstructive sleep apnea (adult) (pediatric): Secondary | ICD-10-CM

## 2019-01-27 DIAGNOSIS — R079 Chest pain, unspecified: Secondary | ICD-10-CM | POA: Insufficient documentation

## 2019-01-27 DIAGNOSIS — I209 Angina pectoris, unspecified: Secondary | ICD-10-CM | POA: Diagnosis not present

## 2019-01-27 MED ORDER — PREDNISONE 5 MG PO TABS: ORAL_TABLET | ORAL | 5 refills | Status: DC

## 2019-01-27 NOTE — Assessment & Plan Note (Signed)
Describing significant exertional substernal pain without obvious progression or radiation currently. Plan- He is to call cardiology for advice as directed.

## 2019-01-27 NOTE — Telephone Encounter (Signed)
  Pt c/o of Chest Pain: STAT if CP now or developed within 24 hours  1. Are you having CP right now? no  2. Are you experiencing any other symptoms (ex. SOB, nausea, vomiting, sweating)? dizziness  3. How long have you been experiencing CP? About a week  4. Is your CP continuous or coming and going? Comes and goes, mostly on exertion, has had two episodes this week  5. Have you taken Nitroglycerin? No  ?

## 2019-01-27 NOTE — Progress Notes (Signed)
HPI  M former smoker with COPD, chronic cough, OSA complicated by Cardiac dysrhythmia/ pacemaker, HBP  Office Spirometry 08/11/14-moderate obstruction/restriction-FVC 2.95/62%, FEV1 2.37/65%, ratio 0.79, FEF 25-75% 2.43/77% PFT 10/26/2017-normal flows without response to bronchodilator, moderate diffusion defect. Walk Test O2 Qualifying-04/26/2018-qualified for portable oxygen-during his second lap heart rate reached 112 and saturation fell to 86% on room air Labs 04/26/2018- IgE 464, EOS wnl HST 05/17/18>> Severe obstructive sleep apnea AHI:33 -----------------------------------------------------------------------------------------  09/20/2018- Virtual Visit via Telephone Note  History of Present Illness: 09/20/2018- 73 year old male former smoker followed for COPD, restrictive lung disease, cough, OSA, complicated by cardiac arrhythmia/pacemaker/ ICD, HBP, DM HST 05/17/18>> Severe obstructive sleep apnea AHI:33 Had televisit w NP 3/27- for OSA/ CPAP f/u CPAP auto 5-20/ [facility] 04/26/2018- IgE 464, EOS wnl O2 2L-  Sleep and prn/ [facility] PFT in 10/2017 had shown normal flows but low DLCO, and he has wheezed on our exams. He tried Trelegy in comparison with Anoro/ Flovent. We wondered about a biologic agent.  -----OSA on CPAP; using every night, no issues w/ pressure settings/mask, states breathing and stamina feelings like it is getting worse, on O2 2L as needed  Doesn't notice benefit from inhalers.  Family shared a viral syndrome last winter. They got better but he did not- only prednisone has seemed to help.  Observations/Objective: CXR 05/20/2018- No acute abnormality noted. Download 97% compliance, AHI 1.8/ hr Baird Lyons, MD  11/09/2018- Virtual Visit via Telephone Note  History of Present Illness:  73 year old male former smoker followed for COPD, restrictive lung disease, cough, OSA, complicated by cardiac arrhythmia/pacemaker/ ICD, HBP, DM HST 05/17/18>> Severe  obstructive sleep apnea AHI:33 CPAP auto 5-20/ [facility] 04/26/2018- IgE 464, EOS wnl O2  2L-  Sleep and prn/ [facility] PFT in 10/2017 had shown normal flows but low DLCO, and he has wheezed on our exams. He tried Trelegy in comparison with Anoro/ Flovent. We considered a biologic agent. Asthma/ Dyspnea- Daughter also on phone today. She has been helping with shopping etc to maintain his Covid distancing. He has been active with mowing/ yard and garden work. Has felt much more stable on prednisone 10 mg qod. Discussed steroid side effects not likely to be severe at this dose. We agreed to continue while stable, saving Xolair or Dupixent option for later. OSA- he skipped CPAP for several days to see if it was causing sore mouth (thrush?) but back to regular use w/o problems. Sleeps better w CPAP+O2.[name]  Observations/Objective: Download compliance 67%, AHI 2.2/ hr- not recorded as used after 10/27/18. He reports restart after hiatus due to sore mouth.   Assessment and Plan: Asthma/ Dyspnea- Prednisone sufficient at 10 mg qod. Consider Xolair/ Dupixent later. OSA- Benefits from CPAP. Continue auto 5-20 with O2 2L. Follow Up Instructions: Keep Sept 24 appointment   Baird Lyons, MD  01/27/2019- 73 year old male former smoker followed for COPD, restrictive lung disease, cough, OSA, complicated by cardiac arrhythmia/pacemaker/ ICD, HBP, DM HST 05/17/18>> Severe obstructive sleep apnea AHI: 33/ hr CPAP auto 5-20/ [facility] 04/26/2018- IgE 464, EOS wnl O2  2L-  Sleep and prn/ [facility] Download-  Body weight today 218 lbs -----OSA on CPAP auto 5-20, DME: [facility]; pt reports increased airflow at times that cause him discomfort He dislikes CPAP but tries to use as directed. No teeth so can't use an oral appliance.  Cough much better- on Trelegy and prednisone 10 mg QOD. Scant white phlegm. Some DOE with hard work outdoors.  Exertional chest pain  relieved by rest,  noted with yard work. He hasn't notified cardiology and does not have NTG.     ROS-see HPI   + = positive Constitutional:    weight loss, night sweats, fevers, chills, fatigue, lassitude. HEENT:    headaches, difficulty swallowing, tooth/dental problems, sore throat,       sneezing, itching, ear ache, +nasal congestion, +post nasal drip, snoring CV:    +chest pain, orthopnea, PND, swelling in lower extremities, anasarca,                                                         dizziness, palpitations Resp:   +shortness of breath with exertion or at rest.                productive cough,   + non-productive cough, coughing up of blood.              change in color of mucus.  +wheezing.   Skin:    rash or lesions. GI:  No-   heartburn, indigestion, abdominal pain, nausea, vomiting, GU:  MS:   joint pain, stiffness,  Neuro-     nothing unusual Psych:  change in mood or affect.  depression or anxiety.   memory loss.  OBJ- Physical Exam General- Alert, Oriented, Affect-appropriate, Distress- none acute Skin- rash-none, lesions- none, excoriation- none Lymphadenopathy- none Head- atraumatic            Eyes- Gross vision intact, PERRLA, conjunctivae and secretions clear            Ears- Hearing, canals-normal            Nose- Clear, no-Septal dev, mucus, polyps, erosion, perforation             Throat- Mallampati II , mucosa clear , drainage+, tonsils- atrophic, edentulous+, not hoarse Neck- flexible , trachea midline, no stridor , thyroid nl, carotid no bruit Chest - symmetrical excursion , unlabored           Heart/CV- RRR , no murmur , no gallop  , no rub, nl s1 s2                           - JVD- none , edema- none, stasis changes- none, varices- none           Lung- , wheeze+, cough+ light, dullness-none, rub- none,            Chest wall- +L pacemaker Abd-  Br/ Gen/ Rectal- Not done, not indicated Extrem- cyanosis- none, clubbing, none, atrophy- none, strength- nl, +cane Neuro-  grossly intact to observation

## 2019-01-27 NOTE — Assessment & Plan Note (Signed)
He is compliant with CPAP but mask leak is high and he is not comfortable.  We need formal CPAP titration with mask refitting to see if we can help him. Plan- attended in-center CPAP titration

## 2019-01-27 NOTE — Telephone Encounter (Signed)
Called patient about his message. Patient stated he has been having chest pain that comes and goes when he is gardening. Patient was at Dr. Janee Morn office this morning and he advised him to call our office. Made patient the first availabe appointment with a provider tomorrow at 11:45 with Pecolia Ades NP. Patient agreed to plan.

## 2019-01-27 NOTE — Patient Instructions (Signed)
Order- please schedule in-center attended CPAP titration study    Dx OSA- need better mask fit and comfort.  Order- Flu vax senior  Please call your cardiology office and tell them about the angina chest pain you have been having with exertion, so they can decide with you what to do.  Suggest we try reducing the prednisone to 5 mg every other day, and continue the Trelegy inhaler once daily.  Prednisone refill sent

## 2019-01-27 NOTE — Assessment & Plan Note (Signed)
Bronchitis with annoying cough much improved on Trelegy and pred 10 QOD. Plan- See if we can get him down to prednisone 5 mg QOD.

## 2019-01-28 ENCOUNTER — Telehealth: Payer: Self-pay | Admitting: Internal Medicine

## 2019-01-28 ENCOUNTER — Encounter: Payer: Self-pay | Admitting: Cardiology

## 2019-01-28 ENCOUNTER — Ambulatory Visit (INDEPENDENT_AMBULATORY_CARE_PROVIDER_SITE_OTHER): Payer: Medicare Other | Admitting: Cardiology

## 2019-01-28 VITALS — BP 130/84 | HR 65 | Ht 72.0 in | Wt 219.1 lb

## 2019-01-28 DIAGNOSIS — Z950 Presence of cardiac pacemaker: Secondary | ICD-10-CM

## 2019-01-28 DIAGNOSIS — I10 Essential (primary) hypertension: Secondary | ICD-10-CM

## 2019-01-28 DIAGNOSIS — J449 Chronic obstructive pulmonary disease, unspecified: Secondary | ICD-10-CM

## 2019-01-28 DIAGNOSIS — R079 Chest pain, unspecified: Secondary | ICD-10-CM

## 2019-01-28 DIAGNOSIS — E118 Type 2 diabetes mellitus with unspecified complications: Secondary | ICD-10-CM

## 2019-01-28 DIAGNOSIS — I471 Supraventricular tachycardia: Secondary | ICD-10-CM

## 2019-01-28 DIAGNOSIS — E785 Hyperlipidemia, unspecified: Secondary | ICD-10-CM

## 2019-01-28 DIAGNOSIS — J9611 Chronic respiratory failure with hypoxia: Secondary | ICD-10-CM

## 2019-01-28 DIAGNOSIS — R0789 Other chest pain: Secondary | ICD-10-CM

## 2019-01-28 DIAGNOSIS — I208 Other forms of angina pectoris: Secondary | ICD-10-CM

## 2019-01-28 DIAGNOSIS — G4733 Obstructive sleep apnea (adult) (pediatric): Secondary | ICD-10-CM

## 2019-01-28 MED ORDER — NITROGLYCERIN 0.4 MG SL SUBL: 0.4000 mg | SUBLINGUAL_TABLET | SUBLINGUAL | 3 refills | Status: DC | PRN

## 2019-01-28 NOTE — Patient Instructions (Addendum)
Medication Instructions:  START: Nitroglycerin 0.4 mg taking 1 tablet as needed for chest pain   If you need a refill on your cardiac medications before your next appointment, please call your pharmacy.   Lab work: FUTURE: BMET 1 week prior to CTA  If you have labs (blood work) drawn today and your tests are completely normal, you will receive your results only by: [name] MyChart Message (if you have MyChart) OR . A paper copy in the mail If you have any lab test that is abnormal or we need to change your treatment, we will call you to review the results.  Testing/Procedures: Your cardiac CT will be scheduled at one of the below locations:   [facility] [address] [place], [place] [address] [phone]  OR  [facility] [address] [place], [place] [address] [phone]  If scheduled at [facility], please arrive at the [facility] main entrance of [facility] 30-45 minutes prior to test start time. Proceed to the [facility] Radiology Department (first floor) to check-in and test prep.  If scheduled at [facility], please arrive 15 mins early for check-in and test prep.  Please follow these instructions carefully (unless otherwise directed):  Hold all erectile dysfunction medications at least 3 days (72 hrs) prior to test.  On the Night Before the Test: . Be sure to Drink plenty of water. . Do not consume any caffeinated/decaffeinated beverages or chocolate 12 hours prior to your test. . Do not take any antihistamines 12 hours prior to your test.  On the Day of the Test: . Drink plenty of water. Do not drink any water within one hour of the test. . Do not eat any food 4 hours prior to the test. . You may take your regular medications prior to the test.  . Take metoprolol (Lopressor) two hours prior to test. . Hydrochlorothiazide morning of the test.   Take 2 of  your diltiazem 1 hour prior to your test       After the Test: . Drink plenty of water. . After receiving IV contrast, you may experience a mild flushed feeling. This is normal. . On occasion, you may experience a mild rash up to 24 hours after the test. This is not dangerous. If this occurs, you can take Benadryl 25 mg and increase your fluid intake. . If you experience trouble breathing, this can be serious. If it is severe call 911 IMMEDIATELY. If it is mild, please call our office. . If you take any of these medications: Glipizide/Metformin, Avandament, Glucavance, please do not take 48 hours after completing test unless otherwise instructed.    Please contact the cardiac imaging nurse navigator should you have any questions/concerns Rockwell Alexandria, RN Navigator Cardiac Imaging [facility] [phone] Office  [phone] Cell    Follow-Up: You are scheduled to see Francis Dowse PA-C on 04/22/2019 @ 1:00 PM  Any Other Special Instructions Will Be Listed Below (If Applicable).   For chest pain: If you have chest pain/pressure, first sit down and rest.  If the discomfort does not resolve place nitroglycerine tablet under your tongue. -If your chest pain does not resolve in 5 minutes, take another nitroglycerine. -If chest pain continues in another 5 minutes, take a 3rd nitroglycerin. If you are still having discomfort after 3 tablets, call 911. -Any time you have chest pain with associated difficulty breathing, breaking out in  a sweat, nausea, call 911.    Coronary Artery Disease, Male Coronary artery disease (CAD) is a condition in which the arteries that lead to the heart (coronary arteries) become narrow or blocked. The narrowing or blockage can lead to decreased blood flow to the heart. Prolonged reduced blood flow can cause a heart attack (myocardial infarction or MI). This condition may also be called coronary heart disease. Because CAD is the leading  cause of death in men, it is important to understand what causes this condition and how it is treated. What are the causes? CAD is most often caused by atherosclerosis. This is the buildup of fat and cholesterol (plaque) on the inside of the arteries. Over time, the plaque may narrow or block the artery, reducing blood flow to the heart. Plaque can also become weak and break off within a coronary artery and cause a sudden blockage. Other less common causes of CAD include:  A blood clot or a piece of a blood clot or other substance that blocks the flow of blood in a coronary artery (embolism).  A tearing of the artery (spontaneous coronary artery dissection).  An enlargement of an artery (aneurysm).  Inflammation (vasculitis) in the artery wall. What increases the risk? The following factors may make you more likely to develop this condition:  Age. Men over age 8 are at a greater risk of CAD.  Family history of CAD.  Gender. Men often develop CAD earlier in life than women.  High blood pressure (hypertension).  Diabetes.  High cholesterol levels.  Tobacco use.  Excessive alcohol use.  Lack of exercise.  A diet high in saturated and trans fats, such as fried food and processed meat. Other possible risk factors include:  High stress levels.  Depression.  Obesity.  Sleep apnea. What are the signs or symptoms? Many people do not have any symptoms during the early stages of CAD. As the condition progresses, symptoms may include:  Chest pain (angina). The pain can: ? Feel like crushing or squeezing, or like a tightness, pressure, fullness, or heaviness in the chest. ? Last more than a few minutes or can stop and recur. The pain tends to get worse with exercise or stress and to fade with rest.  Pain in the arms, neck, jaw, ear, or back.  Unexplained heartburn or indigestion.  Shortness of breath.  Nausea or vomiting.  Sudden light-headedness.  Sudden cold sweats.   Fluttering or fast heartbeat (palpitations). How is this diagnosed? This condition is diagnosed based on:  Your family and medical history.  A physical exam.  Tests, including: ? A test to check the electrical signals in your heart (electrocardiogram). ? Exercise stress test. This looks for signs of blockage when the heart is stressed with exercise, such as running on a treadmill. ? Pharmacologic stress test. This test looks for signs of blockage when the heart is being stressed with a medicine. ? Blood tests. ? Coronary angiogram. This is a procedure to look at the coronary arteries to see if there is any blockage. During this test, a dye is injected into your arteries so they appear on an X-ray. ? Coronary artery CT scan. This CT scan helps detect calcium deposits in your coronary arteries. Calcium deposits are an indicator of CAD. ? A test that uses sound waves to take a picture of your heart (echocardiogram). ? Chest X-ray. How is this treated? This condition may be treated by:  Healthy lifestyle changes to reduce risk factors.  Medicines  such as: ? Antiplatelet medicines and blood-thinning medicines, such as aspirin. These help to prevent blood clots. ? Nitroglycerin. ? Blood pressure medicines. ? Cholesterol-lowering medicine.  Coronary angioplasty and stenting. During this procedure, a thin, flexible tube is inserted through a blood vessel and into a blocked artery. A balloon or similar device on the end of the tube is inflated to open up the artery. In some cases, a small, mesh tube (stent) is inserted into the artery to keep it open.  Coronary artery bypass surgery. During this surgery, veins or arteries from other parts of the body are used to create a bypass around the blockage and allow blood to reach your heart. Follow these instructions at home: Medicines  Take over-the-counter and prescription medicines only as told by your health care provider.  Do not take the  following medicines unless your health care provider approves: ? NSAIDs, such as ibuprofen, naproxen, or celecoxib. ? Vitamin supplements that contain vitamin A, vitamin E, or both. Lifestyle  Follow an exercise program approved by your health care provider. Aim for 150 minutes of moderate exercise or 75 minutes of vigorous exercise each week.  Maintain a healthy weight or lose weight as approved by your health care provider.  Learn to manage stress or try to limit your stress. Ask your health care provider for suggestions if you need help.  Get screened for depression and seek treatment, if needed.  Do not use any products that contain nicotine or tobacco, such as cigarettes, e-cigarettes, and chewing tobacco. If you need help quitting, ask your health care provider.  Do not use illegal drugs. Eating and drinking   Follow a heart-healthy diet. A dietitian can help educate you about healthy food options and changes. In general, eat plenty of fruits and vegetables, lean meats, and whole grains.  Avoid foods high in: ? Sugar. ? Salt (sodium). ? Saturated fat, such as processed or fatty meat. ? Trans fat, such as fried foods.  Use healthy cooking methods such as roasting, grilling, broiling, baking, poaching, steaming, or stir-frying.  Do not drink alcohol if your health care provider tells you not to drink.  If you drink alcohol: ? Limit how much you have to 0-2 drinks per day. ? Be aware of how much alcohol is in your drink. In the [place] one drink equals one 12 oz bottle of beer (355 mL), one 5 oz glass of wine (148 mL), or one 1 oz glass of hard liquor (44 mL). General instructions  Manage any other health conditions, such as hypertension and diabetes. These conditions affect your heart.  Your health care provider may ask you to monitor your blood pressure. Ideally, your blood pressure should be below 130/80.  Keep all follow-up visits as told by your health care provider.  This is important. Get help right away if:  You have pain in your chest, neck, ear, arm, jaw, stomach, or back that: ? Lasts more than a few minutes. ? Is recurring. ? Is not relieved by taking medicine under your tongue (sublingual nitroglycerin).  You have profuse sweating without cause.  You have unexplained: ? Heartburn or indigestion. ? Shortness of breath or difficulty breathing. ? Fluttering or fast heartbeat (palpitations). ? Nausea or vomiting. ? Fatigue. ? Feelings of nervousness or anxiety. ? Weakness. ? Diarrhea.  You have sudden light-headedness or dizziness.  You faint.  You feel like hurting yourself or think about taking your own life. These symptoms may represent a serious problem that is  an emergency. Do not wait to see if the symptoms will go away. Get medical help right away. Call your local emergency services (911 in the [place] Do not drive yourself to the hospital. Summary  Coronary artery disease (CAD) is a condition in which the arteries that lead to the heart (coronary arteries) become narrow or blocked. The narrowing or blockage can lead to a heart attack.  Many people do not have any symptoms during the early stages of CAD.  CAD can be treated with lifestyle changes, medicines, surgery, or a combination of these treatments. This information is not intended to replace advice given to you by your health care provider. Make sure you discuss any questions you have with your health care provider. Document Released: 11/16/2013 Document Revised: 01/08/2018 Document Reviewed: 12/29/2017 Elsevier Patient Education  2020 [employer].

## 2019-01-28 NOTE — Telephone Encounter (Signed)
Called and spoke to pt. Pt states he is returning our call. I do not see where we called pt. However, pt was seen yesterday and ordered a CPAP titration study.   PCC's did you try and call the pt to schedule the titration study? If so, he will be out of town on 10/9-10/12 and you can leave a detailed message after it is scheduled.

## 2019-01-28 NOTE — Progress Notes (Signed)
Cardiology Office Note:    Date:  01/28/2019   ID:  [name] ColaceMarion C [name], DOB 11-Jan-1946, MRN [id number]  PCP:  [name] BlamerHarris, William, MD  Cardiologist:  No primary care provider on file.  Electrophysiologist: [name] MangesSteven Klein, MD  Referring MD: [name] BlamerHarris, William, MD   Chief Complaint  Patient presents with  . Chest Pain    History of Present Illness:    [name] ColaceMarion C Raabe is a 73 y.o. male with a past medical history significant for chronotropic incompetence and sinus node dysfunction status post permanent pacemaker 2007,  Former smoker with COPD on oxygen at night and as needed, OSA on CPAP, hypertension, CKD, diabetes type 2, hyperlipidemia peripheral neuropathy.  The patient had pacemaker generator change in 06/2015 last notes indicate 9 years battery life remaining.  The patient was seen in pulmonology yesterday and describes significant exertional substernal pain without obvious progression or radiation.  He was advised to see cardiology.  In 04/2018 patient was noted to have some lightheadedness and presyncope.  Patient was placed on diltiazem for control of atrial tachycardia by Dr. Graciela [name].  The patient is here today for evaluation of chest pain. His daughter is with him and does a lot of the talking. [name] [name] has been having chest pain with exertion. He points to the center of his chest and says it is a hard pain and associated with mild shortness of breath. Did not have any when walking in from the parking lot. He had symptoms when working in his flower beds-lifting heavy bags, but no symptoms when working in his garden when using the tiller. Does not occur more than once a week. It resolves within just a few minutes when he sits down to rest and has a drink of water.   Symptoms occurred when [place] and was very active, pulling a log out of a creek. He did not have chest pain with chopping wood.  He has no chest pain with walking to his neighbor's house  next door, slightly up hill.   He has chronic shrotness of breath due to COPD but has recently been better with prednosione 10 mg QOD. Not very short of breath with strenuous exertion.   Does not feel associated palpitations. He has not noted any fast heart beat episodes since taking the diltiazem.   He has lightheadedness occasionally when when he has squatted down and then stands up.  This usually occurs when he is outside working in the heat.  His daughter says that he is very physical and always up doing things.    Cardiac studies   DATE TEST EF   9/14 MYOVIEW    % No ischemia  4/16 Echo  55-60 %           Past Medical History:  Diagnosis Date  . Arthritis   . Bradycardia    pacemaker - Medtronic Adapta #[id number] - December 22, 2005  . Carpal tunnel syndrome on both sides   . Carpal tunnel syndrome, bilateral   . Chronic back pain   . Chronic kidney disease   . COPD (chronic obstructive pulmonary disease) (HCC)   . Depression   . Diabetes mellitus    type II controled with diet  . GERD (gastroesophageal reflux disease)   . Headache(784.0)   . Hyperlipidemia   . Hypertension   . Insomnia   . Neuromuscular disorder (HCC)    peripheral neuropathy BLE  . Pneumonia   .  Presence of permanent cardiac pacemaker   . Wears glasses     Past Surgical History:  Procedure Laterality Date  . BACK SURGERY    . CARPAL TUNNEL RELEASE Right 03/24/2017   Procedure: Right CARPAL TUNNEL RELEASE;  Surgeon: [name] Harman, MD;  Location: [facility] OR;  Service: Neurosurgery;  Laterality: Right;  Right CARPAL TUNNEL RELEASE  . EP IMPLANTABLE DEVICE N/A 06/27/2015   Procedure:  PPM Generator Changeout;  Surgeon: [name] Salvia, MD;  Location: [facility] INVASIVE CV LAB;  Service: Cardiovascular;  Laterality: N/A;  . HEMORRHOID SURGERY    . INSERT / REPLACE / REMOVE PACEMAKER    . MULTIPLE TOOTH EXTRACTIONS    . PACEMAKER INSERTION  ?2007  . TOTAL HIP ARTHROPLASTY Right 01/25/2013   Procedure: TOTAL  HIP ARTHROPLASTY ANTERIOR APPROACH;  Surgeon: Velna Ochs, MD;  Location: [facility] OR;  Service: Orthopedics;  Laterality: Right;  . ULNAR NERVE TRANSPOSITION Right 03/24/2017   Procedure: Right Ulnar nerve release;  Surgeon: [name] Harman, MD;  Location: [facility] OR;  Service: Neurosurgery;  Laterality: Right;  Right Ulnar nerve release    Current Medications: Current Meds  Medication Sig  . acetaminophen (TYLENOL) 650 MG CR tablet Take 1,300 mg daily as needed by mouth for pain.   [name] aspirin 81 MG tablet Take 81 mg by mouth daily.  [name] atorvastatin (LIPITOR) 40 MG tablet Take 40 mg by mouth daily.  [name] CALCIUM-MAGNESIUM-ZINC PO Take 1 capsule by mouth daily.  [name] diltiazem (CARDIZEM SR) 120 MG 12 hr capsule Take 1 capsule (120 mg total) by mouth daily.  [name] docusate sodium ([name]) 100 MG capsule Take 100 mg by mouth 2 (two) times daily.  . famotidine (PEPCID) 20 MG tablet TAKE ONE TABLET BY MOUTH ONCE DAILY AT BEDTIME  . gabapentin (NEURONTIN) 300 MG capsule Take 300 mg by mouth 2 (two) times daily.  [name] glimepiride (AMARYL) 1 MG tablet Take 1 tablet by mouth daily.  [name] guaiFENesin (MUCINEX) 600 MG 12 hr tablet Take 1 tablet (600 mg total) by mouth 2 (two) times daily.  . hydrochlorothiazide (HYDRODIURIL) 12.5 MG tablet Take 12.5 mg daily by mouth.  [name] ipratropium-albuterol (DUONEB) 0.5-2.5 (3) MG/3ML SOLN Inhale 3 mLs into the lungs every 6 (six) hours as needed (wheezing or shortness of breath).   [name] levocetirizine (XYZAL) 5 MG tablet Take 5 mg at bedtime by mouth.   . montelukast (SINGULAIR) 10 MG tablet Take 10 mg at bedtime by mouth.   . Multiple Vitamin (MULTIVITAMIN WITH MINERALS) TABS tablet Take 1 tablet by mouth daily.  [name] olmesartan (BENICAR) 20 MG tablet Take 20 mg daily by mouth.  . pantoprazole (PROTONIX) 40 MG tablet TAKE ONE TABLET BY MOUTH ONCE DAILY TAKE  30-60  MINUTES  BEFORE  FIRST  MEAL  OF  THE  DAY  . predniSONE (DELTASONE) 5 MG tablet 1 every other day  . PROAIR HFA 108 (90 BASE)  MCG/ACT inhaler Inhale 2 puffs into the lungs 4 (four) times daily as needed.  [name] Respiratory Therapy Supplies (FLUTTER) DEVI Blow through 4 times per set, 3 sets per day when needed to clear lungs  . TRELEGY ELLIPTA 100-62.5-25 MCG/INH AEPB USE 1 INHALATION BY MOUTH  DAILY     Allergies:   Penicillins, Lopressor [metoprolol tartrate], and Codeine   Social History   Socioeconomic History  . Marital status: Legally Separated    Spouse name: Not on file  . Number of children: 2  . Years of education: `  .  Highest education level: Not on file  Occupational History  . Occupation: Retired  Engineer, production  . Financial resource strain: Not on file  . Food insecurity    Worry: Not on file    Inability: Not on file  . Transportation needs    Medical: Not on file    Non-medical: Not on file  Tobacco Use  . Smoking status: Former Smoker    Packs/day: 2.00    Years: 15.00    Pack years: 30.00    Types: Cigarettes    Start date: 05/05/1958    Quit date: 05/05/1973    Years since quitting: 45.7  . Smokeless tobacco: Never Used  Substance and Sexual Activity  . Alcohol use: No    Alcohol/week: 0.0 standard drinks    Frequency: Never  . Drug use: No  . Sexual activity: Not on file  Lifestyle  . Physical activity    Days per week: Not on file    Minutes per session: Not on file  . Stress: Not on file  Relationships  . Social Musician on phone: Not on file    Gets together: Not on file    Attends religious service: Not on file    Active member of club or organization: Not on file    Attends meetings of clubs or organizations: Not on file    Relationship status: Not on file  Other Topics Concern  . Not on file  Social History Narrative  . Not on file     Family History: The patient's family history includes Allergies in his unknown relative; Asthma in his maternal aunt; Heart disease in his mother. ROS:   Please see the history of present illness.     All other  systems reviewed and are negative.   EKG:  EKG is ordered today.  The ekg ordered today demonstrates atrial pacing, left anterior fascicular block, 65 bpm, QTC 426.  No ST/T changes  Recent Labs: 04/26/2018: Hemoglobin 14.2; Platelets 219.0   Recent Lipid Panel No results found for: CHOL, TRIG, HDL, CHOLHDL, VLDL, LDLCALC, LDLDIRECT  Physical Exam:    VS:  BP 130/84   Pulse 65   Ht 6' (1.829 m)   Wt 219 lb 1.9 oz (99.4 kg)   SpO2 97%   BMI 29.72 kg/m     Wt Readings from Last 6 Encounters:  01/28/19 219 lb 1.9 oz (99.4 kg)  01/27/19 218 lb (98.9 kg)  04/26/18 203 lb 9.6 oz (92.4 kg)  04/22/18 203 lb 9.6 oz (92.4 kg)  10/26/17 199 lb (90.3 kg)  06/29/17 210 lb (95.3 kg)     Physical Exam  Constitutional: He is oriented to person, place, and time. He appears well-developed and well-nourished. No distress.  HENT:  Head: Normocephalic and atraumatic.  Neck: Normal range of motion. Neck supple. No JVD present.  Cardiovascular: Normal rate, regular rhythm, normal heart sounds and intact distal pulses. Exam reveals no gallop and no friction rub.  No murmur heard. Pulmonary/Chest: Effort normal and breath sounds normal. No respiratory distress. He has no wheezes. He has no rales.  Abdominal: Soft. Bowel sounds are normal.  Musculoskeletal: Normal range of motion.        General: No edema.  Neurological: He is alert and oriented to person, place, and time.  Skin: Skin is warm and dry.  Psychiatric: He has a normal mood and affect. His behavior is normal. Judgment and thought content normal.     ASSESSMENT:  1. Chest pain of uncertain etiology   2. Cardiac pacemaker in situ   3. Essential (primary) hypertension   4. COPD with chronic bronchitis (HCC)   5. Chronic respiratory failure with hypoxia (HCC)   6. OSA (obstructive sleep apnea)   7. Atrial tachycardia (HCC)   8. Hyperlipidemia, unspecified hyperlipidemia type   9. Type 2 diabetes mellitus with complication,  without long-term current use of insulin (HCC)   10. Stable angina pectoris (HCC)    PLAN:    In order of problems listed above:  Chest pain -Patient has had previous shortness of breath with COPD confounding evaluation of shortness of breath.  He does have CVD risk factors. -Today he reports that he has occasional mid chest pain only when doing very strenuous activity like lifting heavy bags or pulling a heavy stump, occurring less than once a week over the last couple of months.  He has no chest pain with walking uphill or working in his garden.  -We will check coronary CTA for evaluation of possible coronary artery disease.  Check metabolic panel for renal function prior to the study. -Patient provided with prescription for sublingual nitroglycerin and I taught the patient and his daughter how to use it.  We also discussed signs and symptoms and when to call EMS.  Permanent pacemaker -Placed in 2007 for sinus node dysfunction.  Generator change on 2017. -Followed by Dr. Graciela Husbands  Hypertension -On hydrochlorothiazide 12.5 mg daily, diltiazem 120 mg daily, olmesartan 20 mg daily -Blood pressure is well controlled -Patient only has lightheadedness when he rises up from a squatted position.  We discussed safety measures including rising slowly and making sure that he drinks water before he goes outside to work in his garden.  COPD with home oxygen use -Followed by pulmonology.  On inhalers and supplemental oxygen at night and as needed. Pt has improved on prednisone 10 mg QOD.  -Pt is currently very active and not needing oxygen during the day.   OSA on CPAP -Compliant, using every night  Atrial tachycardia -In 04/2018 patient was noted to have some lightheadedness and presyncope.  Patient was placed on diltiazem for control of atrial tachycardia by Dr. Graciela Husbands. -Short SVT episodes noted on recent PPM download. -Patient has not felt any fast heartbeat since he started diltiazem.   Hyperlipidemia -On atorvastatin 40 mg daily.  Followed by PCP with no lipid panel available in epic. Per [facility], LDL was 84 and 11/2017.  Diabetes type 2 -On Amaryl.   A1c was 6.3 on 05/06/2018.  Well controlled.   Medication Adjustments/Labs and Tests Ordered: Current medicines are reviewed at length with the patient today.  Concerns regarding medicines are outlined above. Labs and tests ordered and medication changes are outlined in the patient instructions below:  Patient Instructions  Medication Instructions:  START: Nitroglycerin 0.4 mg taking 1 tablet as needed for chest pain   If you need a refill on your cardiac medications before your next appointment, please call your pharmacy.   Lab work: FUTURE: BMET 1 week prior to CTA  If you have labs (blood work) drawn today and your tests are completely normal, you will receive your results only by: [name] MyChart Message (if you have MyChart) OR . A paper copy in the mail If you have any lab test that is abnormal or we need to change your treatment, we will call you to review the results.  Testing/Procedures: Your cardiac CT will be scheduled at one of the below locations:  [facility] [address] [place], [place] [address] [phone]  [place] [address] [place], [place] [address] [phone]  If scheduled at [facility], please arrive at the [facility] main entrance of [facility] 30-45 minutes prior to test start time. Proceed to the [facility] Radiology Department (first floor) to check-in and test prep.  If scheduled at [facility], please arrive 15 mins early for check-in and test prep.  Please follow these instructions carefully (unless otherwise directed):  Hold all erectile dysfunction medications at least 3 days (72 hrs) prior to test.  On the Night Before the Test: . Be sure to Drink plenty of  water. . Do not consume any caffeinated/decaffeinated beverages or chocolate 12 hours prior to your test. . Do not take any antihistamines 12 hours prior to your test.  On the Day of the Test: . Drink plenty of water. Do not drink any water within one hour of the test. . Do not eat any food 4 hours prior to the test. . You may take your regular medications prior to the test.  . Take metoprolol (Lopressor) two hours prior to test. . Hydrochlorothiazide morning of the test.   Take 2 of your diltiazem 1 hour prior to your test       After the Test: . Drink plenty of water. . After receiving IV contrast, you may experience a mild flushed feeling. This is normal. . On occasion, you may experience a mild rash up to 24 hours after the test. This is not dangerous. If this occurs, you can take Benadryl 25 mg and increase your fluid intake. . If you experience trouble breathing, this can be serious. If it is severe call 911 IMMEDIATELY. If it is mild, please call our office. . If you take any of these medications: Glipizide/Metformin, Avandament, Glucavance, please do not take 48 hours after completing test unless otherwise instructed.    Please contact the cardiac imaging nurse navigator should you have any questions/concerns Marchia Bond, RN Navigator Cardiac Imaging [facility] [phone] Office  [phone] Cell    Follow-Up: You are scheduled to see Tommye Standard PA-C on 04/22/2019 @ 1:00 PM  Any Other Special Instructions Will Be Listed Below (If Applicable).   For chest pain: If you have chest pain/pressure, first sit down and rest.  If the discomfort does not resolve place nitroglycerine tablet under your tongue. -If your chest pain does not resolve in 5 minutes, take another nitroglycerine. -If chest pain continues in another 5 minutes, take a 3rd nitroglycerin. If you are still having discomfort after 3 tablets, call 911. -Any time you have chest  pain with associated difficulty breathing, breaking out in a sweat, nausea, call 911.    Coronary Artery Disease, Male Coronary artery disease (CAD) is a condition in which the arteries that lead to the heart (coronary arteries) become narrow or blocked. The narrowing or blockage can lead to decreased blood flow to the heart. Prolonged reduced blood flow can cause a heart attack (myocardial infarction or MI). This condition may also be called coronary heart disease. Because CAD is the leading cause of death in men, it is important to understand what causes this condition and how it is treated. What are the causes? CAD is most often caused by atherosclerosis. This is the buildup of fat and cholesterol (plaque) on the inside of the arteries. Over time,  the plaque may narrow or block the artery, reducing blood flow to the heart. Plaque can also become weak and break off within a coronary artery and cause a sudden blockage. Other less common causes of CAD include:  A blood clot or a piece of a blood clot or other substance that blocks the flow of blood in a coronary artery (embolism).  A tearing of the artery (spontaneous coronary artery dissection).  An enlargement of an artery (aneurysm).  Inflammation (vasculitis) in the artery wall. What increases the risk? The following factors may make you more likely to develop this condition:  Age. Men over age 60 are at a greater risk of CAD.  Family history of CAD.  Gender. Men often develop CAD earlier in life than women.  High blood pressure (hypertension).  Diabetes.  High cholesterol levels.  Tobacco use.  Excessive alcohol use.  Lack of exercise.  A diet high in saturated and trans fats, such as fried food and processed meat. Other possible risk factors include:  High stress levels.  Depression.  Obesity.  Sleep apnea. What are the signs or symptoms? Many people do not have any symptoms during the early stages of CAD. As the  condition progresses, symptoms may include:  Chest pain (angina). The pain can: ? Feel like crushing or squeezing, or like a tightness, pressure, fullness, or heaviness in the chest. ? Last more than a few minutes or can stop and recur. The pain tends to get worse with exercise or stress and to fade with rest.  Pain in the arms, neck, jaw, ear, or back.  Unexplained heartburn or indigestion.  Shortness of breath.  Nausea or vomiting.  Sudden light-headedness.  Sudden cold sweats.  Fluttering or fast heartbeat (palpitations). How is this diagnosed? This condition is diagnosed based on:  Your family and medical history.  A physical exam.  Tests, including: ? A test to check the electrical signals in your heart (electrocardiogram). ? Exercise stress test. This looks for signs of blockage when the heart is stressed with exercise, such as running on a treadmill. ? Pharmacologic stress test. This test looks for signs of blockage when the heart is being stressed with a medicine. ? Blood tests. ? Coronary angiogram. This is a procedure to look at the coronary arteries to see if there is any blockage. During this test, a dye is injected into your arteries so they appear on an X-ray. ? Coronary artery CT scan. This CT scan helps detect calcium deposits in your coronary arteries. Calcium deposits are an indicator of CAD. ? A test that uses sound waves to take a picture of your heart (echocardiogram). ? Chest X-ray. How is this treated? This condition may be treated by:  Healthy lifestyle changes to reduce risk factors.  Medicines such as: ? Antiplatelet medicines and blood-thinning medicines, such as aspirin. These help to prevent blood clots. ? Nitroglycerin. ? Blood pressure medicines. ? Cholesterol-lowering medicine.  Coronary angioplasty and stenting. During this procedure, a thin, flexible tube is inserted through a blood vessel and into a blocked artery. A balloon or similar  device on the end of the tube is inflated to open up the artery. In some cases, a small, mesh tube (stent) is inserted into the artery to keep it open.  Coronary artery bypass surgery. During this surgery, veins or arteries from other parts of the body are used to create a bypass around the blockage and allow blood to reach your heart. Follow these instructions at  home: Medicines  Take over-the-counter and prescription medicines only as told by your health care provider.  Do not take the following medicines unless your health care provider approves: ? NSAIDs, such as ibuprofen, naproxen, or celecoxib. ? Vitamin supplements that contain vitamin A, vitamin E, or both. Lifestyle  Follow an exercise program approved by your health care provider. Aim for 150 minutes of moderate exercise or 75 minutes of vigorous exercise each week.  Maintain a healthy weight or lose weight as approved by your health care provider.  Learn to manage stress or try to limit your stress. Ask your health care provider for suggestions if you need help.  Get screened for depression and seek treatment, if needed.  Do not use any products that contain nicotine or tobacco, such as cigarettes, e-cigarettes, and chewing tobacco. If you need help quitting, ask your health care provider.  Do not use illegal drugs. Eating and drinking   Follow a heart-healthy diet. A dietitian can help educate you about healthy food options and changes. In general, eat plenty of fruits and vegetables, lean meats, and whole grains.  Avoid foods high in: ? Sugar. ? Salt (sodium). ? Saturated fat, such as processed or fatty meat. ? Trans fat, such as fried foods.  Use healthy cooking methods such as roasting, grilling, broiling, baking, poaching, steaming, or stir-frying.  Do not drink alcohol if your health care provider tells you not to drink.  If you drink alcohol: ? Limit how much you have to 0-2 drinks per day. ? Be aware of  how much alcohol is in your drink. In the [place] one drink equals one 12 oz bottle of beer (355 mL), one 5 oz glass of wine (148 mL), or one 1 oz glass of hard liquor (44 mL). General instructions  Manage any other health conditions, such as hypertension and diabetes. These conditions affect your heart.  Your health care provider may ask you to monitor your blood pressure. Ideally, your blood pressure should be below 130/80.  Keep all follow-up visits as told by your health care provider. This is important. Get help right away if:  You have pain in your chest, neck, ear, arm, jaw, stomach, or back that: ? Lasts more than a few minutes. ? Is recurring. ? Is not relieved by taking medicine under your tongue (sublingual nitroglycerin).  You have profuse sweating without cause.  You have unexplained: ? Heartburn or indigestion. ? Shortness of breath or difficulty breathing. ? Fluttering or fast heartbeat (palpitations). ? Nausea or vomiting. ? Fatigue. ? Feelings of nervousness or anxiety. ? Weakness. ? Diarrhea.  You have sudden light-headedness or dizziness.  You faint.  You feel like hurting yourself or think about taking your own life. These symptoms may represent a serious problem that is an emergency. Do not wait to see if the symptoms will go away. Get medical help right away. Call your local emergency services (911 in the [place] Do not drive yourself to the hospital. Summary  Coronary artery disease (CAD) is a condition in which the arteries that lead to the heart (coronary arteries) become narrow or blocked. The narrowing or blockage can lead to a heart attack.  Many people do not have any symptoms during the early stages of CAD.  CAD can be treated with lifestyle changes, medicines, surgery, or a combination of these treatments. This information is not intended to replace advice given to you by your health care provider. Make sure you discuss any questions you have  with  your health care provider. Document Released: 11/16/2013 Document Revised: 01/08/2018 Document Reviewed: 12/29/2017 Elsevier Patient Education  [address].     Signed, Berton Bon, NP  01/28/2019 3:06 PM    [facility]

## 2019-01-31 NOTE — Telephone Encounter (Signed)
Spoke to patient & made him aware of sleep study @ [facility] on 10/6.  Let pt know he will be contacted by the sleep center to complete a COVID Test prior to the sleep study & he may need to self-quarantine once test.  Pt verbalized understanding & stated nothing further needed at this time.

## 2019-01-31 NOTE — Telephone Encounter (Signed)
I called the patient to make him aware of the  CPAP Titration study sched for 02/08/2019.  I will contact the patient again with this information.

## 2019-02-08 ENCOUNTER — Other Ambulatory Visit (HOSPITAL_BASED_OUTPATIENT_CLINIC_OR_DEPARTMENT_OTHER): Payer: Self-pay

## 2019-02-10 ENCOUNTER — Telehealth: Payer: Self-pay | Admitting: Internal Medicine

## 2019-02-10 NOTE — Telephone Encounter (Signed)
Called and spoke to pt. Informed him of the recs per CY. Pt verbalized understanding and denied any further questions or concerns at this time.   

## 2019-02-10 NOTE — Telephone Encounter (Signed)
Ok to change prednisone back to 10 mg every other day. Can use 5mg  tabs x 2. Ok to update script if needed.

## 2019-02-10 NOTE — Telephone Encounter (Signed)
Spoke with patient. He stated that since his prednisone was decreased from 10mg  every other day to 5mg  every other day back in September. Since then, his cough has gotten worse. He has a productive cough with thick clear mucus. The cough tends to get worse when he is talking and at night. Slight increase in SOB. He denies any fever, body aches or being around anyone with COVID.   He wants to increase prednisone back to 10mg  every other day if possible. Advised him I would need to check with Dr. Annamaria Boots, he verbalized understanding.   Pharmacy is [facility] in [place].   Allergies  Allergen Reactions  . Penicillins Anaphylaxis    Has patient had a PCN reaction causing immediate rash, facial/tongue/throat swelling, SOB or lightheadedness with hypotension: Yes Has patient had a PCN reaction causing severe rash involving mucus membranes or skin necrosis: No Has patient had a PCN reaction that required hospitalization: Yes Has patient had a PCN reaction occurring within the last 10 years: No If all of the above answers are "NO", then may proceed with Cephalosporin use.   [name] Lopressor [Metoprolol Tartrate]     " It drops my heart rate."  . Codeine Rash

## 2019-02-15 ENCOUNTER — Other Ambulatory Visit (HOSPITAL_COMMUNITY)
Admission: RE | Admit: 2019-02-15 | Discharge: 2019-02-15 | Disposition: A | Discharge status: Home / Self Care | Payer: Medicare Other | Source: Ambulatory Visit | Attending: Internal Medicine | Admitting: Internal Medicine

## 2019-02-15 ENCOUNTER — Other Ambulatory Visit: Payer: Self-pay

## 2019-02-15 DIAGNOSIS — Z20828 Contact with and (suspected) exposure to other viral communicable diseases: Secondary | ICD-10-CM | POA: Diagnosis not present

## 2019-02-15 DIAGNOSIS — Z01812 Encounter for preprocedural laboratory examination: Secondary | ICD-10-CM | POA: Diagnosis present

## 2019-02-15 LAB — SARS CORONAVIRUS 2 (TAT 6-24 HRS): SARS Coronavirus 2: NEGATIVE

## 2019-02-18 ENCOUNTER — Other Ambulatory Visit: Payer: Self-pay

## 2019-02-18 ENCOUNTER — Telehealth: Payer: Self-pay | Admitting: Internal Medicine

## 2019-02-18 ENCOUNTER — Ambulatory Visit: Payer: Medicare Other | Attending: Internal Medicine | Admitting: Internal Medicine

## 2019-02-18 DIAGNOSIS — G4733 Obstructive sleep apnea (adult) (pediatric): Secondary | ICD-10-CM | POA: Diagnosis present

## 2019-02-18 MED ORDER — PREDNISONE 10 MG PO TABS: 10.0000 mg | ORAL_TABLET | ORAL | 5 refills | Status: DC

## 2019-02-18 NOTE — Telephone Encounter (Signed)
Called and spoke w/ pt. Pt states he needs a new Rx for prednisone 10 mg send to [facility]. According to last telephone note from 02/10/2019, CY approved pt's request to go back to 10 mg tabs daily. I let pt know we would send a new rx to his preferred pharmacy. Pt verbalized understanding with no additional questions. Nothing further needed at this time.

## 2019-02-18 NOTE — Telephone Encounter (Signed)
ATC pt, line went to voicemail. LMTCB x1.  [Note: prednisone 5 mg last filled 01/27/2019 20 tab x5 refills. Pt should have plenty of refills available already.]

## 2019-02-24 ENCOUNTER — Other Ambulatory Visit: Payer: Self-pay

## 2019-02-24 DIAGNOSIS — Z01812 Encounter for preprocedural laboratory examination: Secondary | ICD-10-CM

## 2019-02-25 ENCOUNTER — Encounter (HOSPITAL_COMMUNITY): Payer: Self-pay | Admitting: Emergency Medicine

## 2019-02-26 LAB — BASIC METABOLIC PANEL WITH GFR
BUN/Creatinine Ratio: 27 — ABNORMAL HIGH (ref 10–24)
BUN: 34 mg/dL — ABNORMAL HIGH (ref 8–27)
CO2: 25 mmol/L (ref 20–29)
Calcium: 8.8 mg/dL (ref 8.6–10.2)
Chloride: 99 mmol/L (ref 96–106)
Creatinine, Ser: 1.24 mg/dL (ref 0.76–1.27)
GFR calc Af Amer: 65 mL/min/{1.73_m2}
GFR calc non Af Amer: 57 mL/min/{1.73_m2} — ABNORMAL LOW
Glucose: 164 mg/dL — ABNORMAL HIGH (ref 65–99)
Potassium: 3.6 mmol/L (ref 3.5–5.2)
Sodium: 139 mmol/L (ref 134–144)

## 2019-02-28 ENCOUNTER — Telehealth (HOSPITAL_COMMUNITY): Payer: Self-pay | Admitting: Emergency Medicine

## 2019-02-28 NOTE — Telephone Encounter (Signed)
Reaching out to patient to offer assistance regarding upcoming cardiac imaging study; pt verbalizes understanding of appt date/time, parking situation and where to check in, pre-test NPO status and medications ordered, and verified current allergies; name and call back number provided for further questions should they arise [name] Bond RN Navigator [facility] [phone] office [phone] cell  Pt to take daily meds per usual; last HR in office visit was 65bpm Pt has PPM with low rate at 60bpm

## 2019-03-01 ENCOUNTER — Other Ambulatory Visit: Payer: Self-pay

## 2019-03-01 ENCOUNTER — Encounter (HOSPITAL_COMMUNITY): Payer: Self-pay

## 2019-03-01 ENCOUNTER — Ambulatory Visit (HOSPITAL_COMMUNITY)
Admission: RE | Admit: 2019-03-01 | Discharge: 2019-03-01 | Disposition: A | Discharge status: Home / Self Care | Payer: Medicare Other | Source: Ambulatory Visit | Attending: Cardiology | Admitting: Cardiology

## 2019-03-01 DIAGNOSIS — I208 Other forms of angina pectoris: Secondary | ICD-10-CM

## 2019-03-01 MED ORDER — NITROGLYCERIN 0.4 MG SL SUBL
0.8000 mg | SUBLINGUAL_TABLET | Freq: Once | SUBLINGUAL | Status: AC
  Administered 2019-03-01: 15:00:00 0.8 mg via SUBLINGUAL

## 2019-03-01 MED ORDER — IOHEXOL 350 MG/ML SOLN
80.0000 mL | Freq: Once | INTRAVENOUS | Status: AC | PRN
  Administered 2019-03-01: 80 mL via INTRAVENOUS

## 2019-03-01 MED ORDER — NITROGLYCERIN 0.4 MG SL SUBL
SUBLINGUAL_TABLET | SUBLINGUAL | Status: AC
  Administered 2019-03-01: 0.8 mg via SUBLINGUAL
  Filled 2019-03-01: qty 2

## 2019-03-01 NOTE — Discharge Instructions (Signed)
Testing With IV Contrast Material °IV contrast material is a fluid that is used with some imaging tests. It is injected into your body through a vein. Contrast material is used when your health care providers need a detailed look at organs, tissues, or blood vessels that may not show up with the standard test. The material may be used when an X-ray, an MRI, a CT scan, or an ultrasound is done. °IV contrast material may be used for imaging tests that check: °· Muscles, skin, and fat. °· Breasts. °· Brain. °· Digestive tract. °· Heart. °· Organs such as the liver, kidneys, lungs, bladder, and many others. °· Arteries and veins. °Tell a health care provider about: °· Any allergies you have, especially an allergy to contrast material. °· All medicines you are taking, including metformin, beta blockers, NSAIDs (such as ibuprofen), interleukin-2, vitamins, herbs, eye drops, creams, and over-the-counter medicines. °· Any problems you or family members have had with the use of contrast material. °· Any blood disorders you have, such as sickle cell anemia. °· Any surgeries you have had. °· Any medical conditions you have or have had, especially alcohol abuse, dehydration, asthma, or kidney, liver, or heart problems. °· Whether you are pregnant or may be pregnant. °· Whether you are breastfeeding. Most contrast materials are safe for use in breastfeeding women. °What are the risks? °Generally, this is a safe procedure. However, problems may occur, including: °· Headache. °· Itching, skin rash, and hives. °· Nausea and vomiting. °· Allergic reactions. °· Wheezing or difficulty breathing. °· Abnormal heart rate. °· Changes in blood pressure. °· Throat swelling. °· Kidney damage. °What happens before the procedure? °Medicines °Ask your health care provider about: °· Changing or stopping your regular medicines. This is especially important if you are taking diabetes medicines or blood thinners. °· Taking medicines such as aspirin  and ibuprofen. These medicines can thin your blood. Do not take these medicines unless your health care provider tells you to take them. °· Taking over-the-counter medicines, vitamins, herbs, and supplements. °If you are at risk of having a reaction to the IV contrast material, you may be asked to take medicine before the procedure to prevent a reaction. °General instructions °· Follow instructions from your health care provider about eating or drinking restrictions. °· You may have an exam or lab tests to make sure that you can safely get IV contrast material. °· Ask if you will be given a medicine to help you relax (sedative) during the procedure. If so, plan to have someone take you home from the hospital or clinic. °What happens during the procedure? °· You may be given a sedative to help you relax. °· An IV will be inserted into one of your veins. °· Contrast material will be injected into your IV. °· You may feel warmth or flushing as the contrast material enters your bloodstream. °· You may have a metallic taste in your mouth for a few minutes. °· The needle may cause some discomfort and bruising. °· After the contrast material is in your body, the imaging test will be done. °The procedure may vary among health care providers and hospitals. °What can I expect after the procedure? °· The IV will be removed. °· You may be taken to a recovery area if sedation medicines were used. Your blood pressure, heart rate, breathing rate, and blood oxygen level will be monitored until you leave the hospital or clinic. °Follow these instructions at home: ° °· Take over-the-counter and   prescription medicines only as told by your health care provider. °? Your health care provider may tell you to not take certain medicines for a couple of days after the procedure. This is especially important if you are taking diabetes medicines. °· If you are told, drink enough fluid to keep your urine pale yellow. This will help to remove  the contrast material out of your body. °· Do not drive for 24 hours if you were given a sedative during your procedure. °· It is up to you to get the results of your procedure. Ask your health care provider, or the department that is doing the procedure, when your results will be ready. °· Keep all follow-up visits as told by your health care provider. This is important. °Contact a health care provider if: °· You have redness, swelling, or pain near your IV site. °Get help right away if: °· You have an abnormal heart rhythm. °· You have trouble breathing. °· You have: °? Chest pain. °? Pain in your back, neck, arm, jaw, or stomach. °? Nausea or sweating. °? Hives or a rash. °· You start shaking and cannot stop. °These symptoms may represent a serious problem that is an emergency. Do not wait to see if the symptoms will go away. Get medical help right away. Call your local emergency services (911 in the [place] Do not drive yourself to the hospital. °Summary °· IV contrast material may be used for imaging tests to help your health care providers see your organs and tissues more clearly. °· Tell your health care provider if you are pregnant or may be pregnant. °· During the procedure, you may feel warmth or flushing as the contrast material enters your bloodstream. °· After the procedure, drink enough fluid to keep your urine pale yellow. °This information is not intended to replace advice given to you by your health care provider. Make sure you discuss any questions you have with your health care provider. °Document Released: 04/09/2009 Document Revised: 07/08/2018 Document Reviewed: 07/08/2018 °Elsevier Patient Education © 2020 [employer] ° ° °Cardiac CT Angiogram ° °A cardiac CT angiogram is a procedure to look at the heart and the area around the heart. It may be done to help find the cause of chest pains or other symptoms of heart disease. During this procedure, a large X-ray machine, called a CT scanner,  takes detailed pictures of the heart and the surrounding area after a dye (contrast material) has been injected into blood vessels in the area. The procedure is also sometimes called a coronary CT angiogram, coronary artery scanning, or CTA. °A cardiac CT angiogram allows the health care provider to see how well blood is flowing to and from the heart. The health care provider will be able to see if there are any problems, such as: °· Blockage or narrowing of the coronary arteries in the heart. °· Fluid around the heart. °· Signs of weakness or disease in the muscles, valves, and tissues of the heart. °Tell a health care provider about: °· Any allergies you have. This is especially important if you have had a previous allergic reaction to contrast dye. °· All medicines you are taking, including vitamins, herbs, eye drops, creams, and over-the-counter medicines. °· Any blood disorders you have. °· Any surgeries you have had. °· Any medical conditions you have. °· Whether you are pregnant or may be pregnant. °· Any anxiety disorders, chronic pain, or other conditions you have that may increase your stress or prevent   you from lying still. °What are the risks? °Generally, this is a safe procedure. However, problems may occur, including: °· Bleeding. °· Infection. °· Allergic reactions to medicines or dyes. °· Damage to other structures or organs. °· Kidney damage from the dye or contrast that is used. °· Increased risk of cancer from radiation exposure. This risk is low. Talk with your health care provider about: °? The risks and benefits of testing. °? How you can receive the lowest dose of radiation. °What happens before the procedure? °· Wear comfortable clothing and remove any jewelry, glasses, dentures, and hearing aids. °· Follow instructions from your health care provider about eating and drinking. This may include: °? For 12 hours before the test -- avoid caffeine. This includes tea, coffee, soda, energy drinks,  and diet pills. Drink plenty of water or other fluids that do not have caffeine in them. Being well-hydrated can prevent complications. °? For 4-6 hours before the test -- stop eating and drinking. The contrast dye can cause nausea, but this is less likely if your stomach is empty. °· Ask your health care provider about changing or stopping your regular medicines. This is especially important if you are taking diabetes medicines, blood thinners, or medicines to treat erectile dysfunction. °What happens during the procedure? °· Hair on your chest may need to be removed so that small sticky patches called electrodes can be placed on your chest. These will transmit information that helps to monitor your heart during the test. °· An IV tube will be inserted into one of your veins. °· You might be given a medicine to control your heart rate during the test. This will help to ensure that good images are obtained. °· You will be asked to lie on an exam table. This table will slide in and out of the CT machine during the procedure. °· Contrast dye will be injected into the IV tube. You might feel warm, or you may get a metallic taste in your mouth. °· You will be given a medicine (nitroglycerin) to relax (dilate) the arteries in your heart. °· The table that you are lying on will move into the CT machine tunnel for the scan. °· The person running the machine will give you instructions while the scans are being done. You may be asked to: °? Keep your arms above your head. °? Hold your breath. °? Stay very still, even if the table is moving. °· When the scanning is complete, you will be moved out of the machine. °· The IV tube will be removed. °The procedure may vary among health care providers and hospitals. °What happens after the procedure? °· You might feel warm, or you may get a metallic taste in your mouth from the contrast dye. °· You may have a headache from the nitroglycerin. °· After the procedure, drink water or  other fluids to wash (flush) the contrast material out of your body. °· Contact a health care provider if you have any symptoms of allergy to the contrast. These symptoms include: °? Shortness of breath. °? Rash or hives. °? A racing heartbeat. °· Most people can return to their normal activities right after the procedure. Ask your health care provider what activities are safe for you. °· It is up to you to get the results of your procedure. Ask your health care provider, or the department that is doing the procedure, when your results will be ready. °Summary °· A cardiac CT angiogram is a procedure to   look at the heart and the area around the heart. It may be done to help find the cause of chest pains or other symptoms of heart disease. °· During this procedure, a large X-ray machine, called a CT scanner, takes detailed pictures of the heart and the surrounding area after a dye (contrast material) has been injected into blood vessels in the area. °· Ask your health care provider about changing or stopping your regular medicines before the procedure. This is especially important if you are taking diabetes medicines, blood thinners, or medicines to treat erectile dysfunction. °· After the procedure, drink water or other fluids to wash (flush) the contrast material out of your body. °This information is not intended to replace advice given to you by your health care provider. Make sure you discuss any questions you have with your health care provider. °Document Released: 04/03/2008 Document Revised: 04/03/2017 Document Reviewed: 03/10/2016 °Elsevier Patient Education © 2020 Elsevier Inc. ° °

## 2019-03-01 NOTE — Progress Notes (Signed)
Patient tolerated exam without incident.  PIV removed, dressing applied.  Pt provided with beverage and snack post exam.  Discharge instructions discussed, all questions answered.  Pt discharged

## 2019-03-10 ENCOUNTER — Encounter (INDEPENDENT_AMBULATORY_CARE_PROVIDER_SITE_OTHER): Payer: Medicare Other | Admitting: Ophthalmology

## 2019-03-10 DIAGNOSIS — E11319 Type 2 diabetes mellitus with unspecified diabetic retinopathy without macular edema: Secondary | ICD-10-CM

## 2019-03-10 DIAGNOSIS — H35033 Hypertensive retinopathy, bilateral: Secondary | ICD-10-CM | POA: Diagnosis not present

## 2019-03-10 DIAGNOSIS — E113293 Type 2 diabetes mellitus with mild nonproliferative diabetic retinopathy without macular edema, bilateral: Secondary | ICD-10-CM | POA: Diagnosis not present

## 2019-03-10 DIAGNOSIS — I10 Essential (primary) hypertension: Secondary | ICD-10-CM

## 2019-03-10 DIAGNOSIS — H2513 Age-related nuclear cataract, bilateral: Secondary | ICD-10-CM

## 2019-03-10 DIAGNOSIS — H43813 Vitreous degeneration, bilateral: Secondary | ICD-10-CM

## 2019-03-20 NOTE — Procedures (Signed)
[place]     Patient Name: [name] [name], [name] [name] Date: 02/18/2019 Gender: Male D.O.B: 1945/10/26 Age (years): 46 Referring Provider: Baird Lyons MD, ABSM Height (inches): 72 Interpreting Physician: Baird Lyons MD, ABSM Weight (lbs): 218 RPSGT: Peak, Robert BMI: 30 MRN: [id number] Neck Size: 16.00  CLINICAL INFORMATION The patient is referred for a BiPAP titration to treat sleep apnea.  Date of NPSG, Split Night or HST:  SLEEP STUDY TECHNIQUE As per the AASM Manual for the Scoring of Sleep and Associated Events v2.3 (April 2016) with a hypopnea requiring 4% desaturations.  The channels recorded and monitored were frontal, central and occipital EEG, electrooculogram (EOG), submentalis EMG (chin), nasal and oral airflow, thoracic and abdominal wall motion, anterior tibialis EMG, snore microphone, electrocardiogram, and pulse oximetry. Bilevel positive airway pressure (BPAP) was initiated at the beginning of the study and titrated to treat sleep-disordered breathing.  MEDICATIONS Medications self-administered by patient taken the night of the study : none reported  RESPIRATORY PARAMETERS Optimal IPAP Pressure (cm): 10 AHI at Optimal Pressure (/hr) N/A Optimal EPAP Pressure (cm): 6   Overall Minimal O2 (%): 0.0 Minimal O2 at Optimal Pressure (%): 0.0 SLEEP ARCHITECTURE Start Time: 10:26:07 PM Stop Time: 4:42:53 AM Total Time (min): 376.8 Total Sleep Time (min): 219.5 Sleep Latency (min): 13.8 Sleep Efficiency (%): 58.3% REM Latency (min): 52.5 WASO (min): 143.5 Stage N1 (%): 8.7% Stage N2 (%): 47.2% Stage N3 (%): 8.0% Stage R (%): 36.2 Supine (%): 51.94 Arousal Index (/hr): 16.1   CARDIAC DATA The 2 lead EKG demonstrated sinus rhythm. The mean heart rate was N/A beats per minute. Other EKG findings include: PVCs.  LEG MOVEMENT DATA The total Periodic Limb Movements of Sleep (PLMS) were 0. The PLMS index was 0.0. A PLMS index of <15 is considered normal  in adults.  IMPRESSIONS - CPAP titration was begun, then changed to BIPAP for more comfortable control at tolerated pressures. - An optimal BiPAP pressure was selected for this patient ( 10 / 6 cm of water) - Mild Central Sleep Apnea was noted during this titration (CAI = 5.7/h). - Oxygen desaturations were observed during this titration. Mean sat on BIPAP was 94.2%. - No snoring was audible during this study. - 2-lead EKG demonstrated: PVCs - Clinically significant periodic limb movements were not noted during this study.  DIAGNOSIS - Obstructive Sleep Apnea (327.23 [G47.33 ICD-10])  RECOMMENDATIONS - Trial of BiPAP therapy on 10/6 cm H2O. If not tolerated, consider trial on CPAP 7. - Patient used a Standard size Resmed Nasal Mask AirFit N30I mask and heated humidification. - Be careful with alcohol, sedatives and other CNS depressants that may worsen sleep apnea and disrupt normal sleep architecture. - Sleep hygiene should be reviewed to assess factors that may improve sleep quality. - Weight management and regular exercise should be initiated or continued.  [Electronically signed] 03/20/2019 11:54 AM  Baird Lyons MD, ABSM Diplomate, American Board of Sleep Medicine   NPI: [id number]                          [place] [place] [facility]  ELECTRONICALLY SIGNED ON:  03/20/2019, 11:44 AM [place] PH: [phone]   FX: [phone] [place]

## 2019-03-29 NOTE — Progress Notes (Signed)
LMTCB

## 2019-03-30 ENCOUNTER — Telehealth: Payer: Self-pay | Admitting: Internal Medicine

## 2019-03-30 DIAGNOSIS — G4733 Obstructive sleep apnea (adult) (pediatric): Secondary | ICD-10-CM

## 2019-03-30 NOTE — Progress Notes (Signed)
LMTCB

## 2019-03-30 NOTE — Telephone Encounter (Signed)
  Advised pt of results. Pt understood and nothing further is needed. Order sent.   Notes recorded by Deneise Lever, MD on 03/23/2019 at 2:17 PM EST  CPAP titration study shows best control with a BIPAP machine. Hopefully he will find this more comfortable than CPAP, because it has 2 pressure settings.   Order- DME [facility]- please change CPAP to BIPAP 10/6, mask of choice, humidifier, supplies, airView/ card

## 2019-04-04 ENCOUNTER — Ambulatory Visit (INDEPENDENT_AMBULATORY_CARE_PROVIDER_SITE_OTHER): Payer: Medicare Other | Admitting: *Deleted

## 2019-04-04 DIAGNOSIS — I495 Sick sinus syndrome: Secondary | ICD-10-CM | POA: Diagnosis not present

## 2019-04-05 LAB — CUP PACEART REMOTE DEVICE CHECK
Battery Impedance: 207 Ohm
Battery Remaining Longevity: 114 mo
Battery Voltage: 2.78 V
Brady Statistic AP VP Percent: 4 %
Brady Statistic AP VS Percent: 82 %
Brady Statistic AS VP Percent: 0 %
Brady Statistic AS VS Percent: 14 %
Date Time Interrogation Session: 20201130163141
Implantable Lead Implant Date: 20070820
Implantable Lead Implant Date: 20070820
Implantable Lead Location: 753859
Implantable Lead Location: 753860
Implantable Lead Model: 4092
Implantable Lead Model: 5076
Implantable Pulse Generator Implant Date: 20170222
Lead Channel Impedance Value: 455 Ohm
Lead Channel Impedance Value: 571 Ohm
Lead Channel Pacing Threshold Amplitude: 0.875 V
Lead Channel Pacing Threshold Amplitude: 1.125 V
Lead Channel Pacing Threshold Pulse Width: 0.4 ms
Lead Channel Pacing Threshold Pulse Width: 0.4 ms
Lead Channel Setting Pacing Amplitude: 2.25 V
Lead Channel Setting Pacing Amplitude: 2.5 V
Lead Channel Setting Pacing Pulse Width: 0.4 ms
Lead Channel Setting Sensing Sensitivity: 1.4 mV

## 2019-04-09 ENCOUNTER — Other Ambulatory Visit: Payer: Self-pay | Admitting: Internal Medicine

## 2019-04-21 NOTE — Progress Notes (Signed)
Cardiology Office Note Date:  04/21/2019  Patient ID:  [name] [name], DOB Jul 17, 1945, MRN [id number] PCP:  [name], William, MD  Electrophysiologist:  Dr. Graciela HusbandsKlein Pulmonology: Dr. Maple HudsonYoung   Chief Complaint:   annual EP visit  History of Present Illness: [name] [name] is a 73 y.o. male with history of O2 dep COPD (has been weaned off, follows regularly with Dr. Maple HudsonYoung), restrictive lung disease, HTN, DM, HLD, bradycardia w/PPM, ATach.  He comes today to be seen for Dr. Graciela HusbandsKlein.  She was last seen by Dr. Graciela HusbandsKlein Feb 2018, he was doing well, his note mention a low risk myoview done in 2014.  I saw him Feb 2019, he was being treated for recurrent bronchitis, outside of this was feeling well.  No CP, palpitations, no dizziness, near syncope or syncope.  He reported his PMD monitors and manages his cholesterol  Most recently he saw J. Jeraldine LootsHammond, NP, advised by his pulmonologist to see cardiology with c/o CP.  He was planned for coronary CT, prescribed s/l NTG. CT noted NOD.  He is doing well.  He mentions the CP with very vigorous/peak "max" kind of activities, like chopping wood.  None since his last visit above.  NO dizzy spells, no near syncope or syncope.  He denise any palpitations, no rest SOB, no DOE with his ADLs or even moderately vigorous activities.   He mentions he has been transitioned from CPAP to BIPAP, and that Dr. Maple HudsonYoung is weaning him form O@ now, even at night.  Her reports his PMD does labs Q 6 months and manages his lipids.   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 Arthritis   . Bradycardia    pacemaker - Medtronic Adapta #[id number] - December 22, 2005  . Carpal tunnel syndrome on both sides   . Carpal tunnel syndrome, bilateral   . Chronic back pain   . Chronic kidney disease   . COPD (chronic obstructive pulmonary disease) (HCC)   . Depression   . Diabetes mellitus      type II controled with diet  . GERD (gastroesophageal reflux disease)   . Headache(784.0)   . Hyperlipidemia   . Hypertension   . Insomnia   . Neuromuscular disorder (HCC)    peripheral neuropathy BLE  . Pneumonia   . Presence of permanent cardiac pacemaker   . Wears glasses     Past Surgical History:  Procedure Laterality Date  . BACK SURGERY    . CARPAL TUNNEL RELEASE Right 03/24/2017   Procedure: Right CARPAL TUNNEL RELEASE;  Surgeon: Maeola HarmanStern, Joseph, MD;  [facility];  Service: Neurosurgery;  Laterality: Right;  Right CARPAL TUNNEL RELEASE  . EP IMPLANTABLE DEVICE N/A 06/27/2015   Procedure:  PPM Generator Changeout;  Surgeon: Duke SalviaSteven C Klein, MD;  [facility];  Service: Cardiovascular;  Laterality: N/A;  . HEMORRHOID SURGERY    . INSERT / REPLACE / REMOVE PACEMAKER    . MULTIPLE TOOTH EXTRACTIONS    . PACEMAKER INSERTION  ?2007  . TOTAL HIP ARTHROPLASTY Right 01/25/2013   Procedure: TOTAL HIP ARTHROPLASTY ANTERIOR APPROACH;  Surgeon: Velna OchsPeter G Dalldorf, MD;  Location: [facility] OR;  Service: Orthopedics;  Laterality: Right;  . ULNAR NERVE TRANSPOSITION Right 03/24/2017   Procedure: Right Ulnar nerve release;  Surgeon: Maeola HarmanStern, Joseph, MD;  [facility];  Service: Neurosurgery;  Laterality: Right;  Right Ulnar nerve release  Current Outpatient Medications  Medication Sig Dispense Refill  . acetaminophen (TYLENOL) 650 MG CR tablet Take 1,300 mg daily as needed by mouth for pain.     [name] aspirin 81 MG tablet Take 81 mg by mouth daily.    [name] atorvastatin (LIPITOR) 40 MG tablet Take 40 mg by mouth daily.    [name] CALCIUM-MAGNESIUM-ZINC PO Take 1 capsule by mouth daily.    [name] diltiazem (CARDIZEM SR) 120 MG 12 hr capsule Take 1 capsule by mouth once daily 90 capsule 0  . docusate sodium (COLACE) 100 MG capsule Take 100 mg by mouth 2 (two) times daily.    . famotidine (PEPCID) 20 MG tablet TAKE ONE TABLET BY MOUTH ONCE DAILY AT BEDTIME 30 tablet 0  . gabapentin (NEURONTIN)  300 MG capsule Take 300 mg by mouth 2 (two) times daily.    [name] glimepiride (AMARYL) 1 MG tablet Take 1 tablet by mouth daily.    [name] guaiFENesin (MUCINEX) 600 MG 12 hr tablet Take 1 tablet (600 mg total) by mouth 2 (two) times daily. 30 tablet 3  . hydrochlorothiazide (HYDRODIURIL) 12.5 MG tablet Take 12.5 mg daily by mouth.    [name] ipratropium-albuterol (DUONEB) 0.5-2.5 (3) MG/3ML SOLN Inhale 3 mLs into the lungs every 6 (six) hours as needed (wheezing or shortness of breath).     [name] levocetirizine (XYZAL) 5 MG tablet Take 5 mg at bedtime by mouth.     . montelukast (SINGULAIR) 10 MG tablet Take 10 mg at bedtime by mouth.     . Multiple Vitamin (MULTIVITAMIN WITH MINERALS) TABS tablet Take 1 tablet by mouth daily.    . nitroGLYCERIN (NITROSTAT) 0.4 MG SL tablet Place 1 tablet (0.4 mg total) under the tongue every 5 (five) minutes as needed for chest pain. 25 tablet 3  . olmesartan (BENICAR) 20 MG tablet Take 20 mg daily by mouth.    . pantoprazole (PROTONIX) 40 MG tablet TAKE ONE TABLET BY MOUTH ONCE DAILY TAKE  30-60  MINUTES  BEFORE  FIRST  MEAL  OF  THE  DAY 30 tablet 0  . predniSONE (DELTASONE) 10 MG tablet Take 1 tablet (10 mg total) by mouth every other day. 20 tablet 5  . PROAIR HFA 108 (90 BASE) MCG/ACT inhaler Inhale 2 puffs into the lungs 4 (four) times daily as needed.    [name] Respiratory Therapy Supplies (FLUTTER) DEVI Blow through 4 times per set, 3 sets per day when needed to clear lungs 1 each 0  . TRELEGY ELLIPTA 100-62.5-25 MCG/INH AEPB USE 1 INHALATION BY MOUTH  DAILY 180 each 3   No current facility-administered medications for this visit.    Allergies:   Penicillins, Lopressor [metoprolol tartrate], and Codeine   Social History:  The patient  reports that he quit smoking about 45 years ago. His smoking use included cigarettes. He started smoking about 61 years ago. He has a 30.00 pack-year smoking history. He has never used smokeless tobacco. He reports that he does not drink alcohol  or use drugs.   Family History:  The patient's family history includes Allergies in an other family member; Asthma in his maternal aunt; Heart disease in his mother.  ROS:  Please see the history of present illness.  All other systems are reviewed and otherwise negative.   PHYSICAL EXAM:  VS:  There were no vitals taken for this visit. BMI: There is no height or weight on file to calculate BMI. Well nourished, well developed, in no acute distress  HEENT: normocephalic, atraumatic  Neck: no JVD, carotid bruits or masses Cardiac:  RRR; no significant murmurs, no rubs, or gallops Lungs: CTA b/l, no wheezing, rhonchi or rales  Abd: soft, nontender, obese MS: no deformity, age appropriate atrophy Ext:   no edema  Skin: warm and dry, no rash Neuro:  No gross deficits appreciated Psych: euthymic mood, full affect   PPM site is stable, no tethering or discomfort   EKG:  Not done today PPM interrogation done today and reviewed by myself;  Battery and lead measurements are good HVR noted, EGMs available are reviewed, are 1:1, longest 29 seconds back in Dec 2019  03/01/2019: Coronary CTA IMPRESSION: 1. Coronary calcium score of 117. This was 36th percentile for age and sex matched control. 2. Normal coronary origin with left dominance. 3. No non-obstructive CAD.  08/29/14: TTE Study Conclusions - Left ventricle: The cavity size was normal. There was mild focal   basal hypertrophy of the septum. Systolic function was normal.   The estimated ejection fraction was in the range of 55% to 60%.   Wall motion was normal; there were no regional wall motion   abnormalities. Doppler parameters are consistent with abnormal   left ventricular relaxation (grade 1 diastolic dysfunction). - Left atrium: The atrium was mildly dilated. - Pulmonary arteries: Systolic pressure was mildly increased. PA   peak pressure: 34 mm Hg (S). Impressions: - Normal LV function; grade 1 diastolic dysfunction;  mild LAE;   trace TR; mildly elevated pulmonary pressure.  Recent Labs: 04/26/2018: Hemoglobin 14.2; Platelets 219.0 02/25/2019: BUN 34; Creatinine, Ser 1.24; Potassium 3.6; Sodium 139  No results found for requested labs within last 8760 hours.   CrCl cannot be calculated (Patient's most recent lab result is older than the maximum 21 days allowed.).   Wt Readings from Last 3 Encounters:  01/28/19 219 lb 1.9 oz (99.4 kg)  01/27/19 218 lb (98.9 kg)  04/26/18 203 lb 9.6 oz (92.4 kg)     Other studies reviewed: Additional studies/records reviewed today include: summarized above  ASSESSMENT AND PLAN:  1. Symptomatic bradycardia/PPM      Intact function, no changes made  2. HTN     A bit high today, he reports usually in 140's     He would prefer not to change his medicines, encouraged to minimize sodium, weight loss  3. HLD     Monitored and managed with his PMD    Disposition: will continue Q 3 mo remotes, and 1 year in clinic, sooner if needed    Current medicines are reviewed at length with the patient today.  The patient did not have any concerns regarding medicines.  Norma Fredrickson, PA-C 04/21/2019 6:11 AM     [facility] [address] [place] [address] [phone] (office)  [phone] (fax)

## 2019-04-22 ENCOUNTER — Ambulatory Visit: Payer: Medicare Other | Admitting: Physician Assistant

## 2019-04-22 ENCOUNTER — Other Ambulatory Visit: Payer: Self-pay

## 2019-04-22 VITALS — BP 150/76 | HR 62 | Ht 72.0 in | Wt 229.0 lb

## 2019-04-22 DIAGNOSIS — I471 Supraventricular tachycardia: Secondary | ICD-10-CM

## 2019-04-22 DIAGNOSIS — I495 Sick sinus syndrome: Secondary | ICD-10-CM | POA: Diagnosis not present

## 2019-04-22 DIAGNOSIS — E785 Hyperlipidemia, unspecified: Secondary | ICD-10-CM

## 2019-04-22 DIAGNOSIS — I10 Essential (primary) hypertension: Secondary | ICD-10-CM

## 2019-04-22 NOTE — Patient Instructions (Signed)
Medication Instructions:   Your physician recommends that you continue on your current medications as directed. Please refer to the Current Medication list given to you today.  *If you need a refill on your cardiac medications before your next appointment, please call your pharmacy*  Lab Work: NONE ORDERED  TODAY   If you have labs (blood work) drawn today and your tests are completely normal, you will receive your results only by: . MyChart Message (if you have MyChart) OR . A paper copy in the mail If you have any lab test that is abnormal or we need to change your treatment, we will call you to review the results.  Testing/Procedures: NONE ORDERED  TODAY    Follow-Up: At [facility], you and your health needs are our priority.  As part of our continuing mission to provide you with exceptional heart care, we have created designated Provider Care Teams.  These Care Teams include your primary Cardiologist (physician) and Advanced Practice Providers (APPs -  Physician Assistants and Nurse Practitioners) who all work together to provide you with the care you need, when you need it.  Your next appointment:   1 year(s)  The format for your next appointment:   In Person  Provider:   You may see Steven Klein, MD or one of the following Advanced Practice Providers on your designated Care Team:    Amber Seiler, NP  Renee Ursuy, PA-C  Michael "Andy" Tillery, PA-C   Other Instructions  

## 2019-05-30 ENCOUNTER — Ambulatory Visit (INDEPENDENT_AMBULATORY_CARE_PROVIDER_SITE_OTHER): Payer: Medicare Other | Admitting: Internal Medicine

## 2019-05-30 ENCOUNTER — Encounter: Payer: Self-pay | Admitting: Internal Medicine

## 2019-05-30 ENCOUNTER — Other Ambulatory Visit: Payer: Self-pay

## 2019-05-30 ENCOUNTER — Ambulatory Visit (INDEPENDENT_AMBULATORY_CARE_PROVIDER_SITE_OTHER): Payer: Medicare Other

## 2019-05-30 VITALS — BP 122/84 | HR 73 | Temp 97.4°F | Ht 72.0 in | Wt 225.2 lb

## 2019-05-30 DIAGNOSIS — J9611 Chronic respiratory failure with hypoxia: Secondary | ICD-10-CM | POA: Diagnosis not present

## 2019-05-30 DIAGNOSIS — G4734 Idiopathic sleep related nonobstructive alveolar hypoventilation: Secondary | ICD-10-CM | POA: Diagnosis not present

## 2019-05-30 DIAGNOSIS — J449 Chronic obstructive pulmonary disease, unspecified: Secondary | ICD-10-CM | POA: Diagnosis not present

## 2019-05-30 DIAGNOSIS — G4733 Obstructive sleep apnea (adult) (pediatric): Secondary | ICD-10-CM

## 2019-05-30 NOTE — Assessment & Plan Note (Addendum)
Dyspnea with exertion probably not much changed. Uncertain if there is any cardiac component, since he has angina. Plan- Suggested he try reducing prednisone to 5 mg (1/2 tab) every other day. CXR

## 2019-05-30 NOTE — Patient Instructions (Signed)
Order- CXR dx COPD mixed type  Order- DME [facility]    Overnight oximetry on BIPAP with room air    Dx nocturnal hypoxemia  Suggest you try reducing your prednisone to 5 mg (1/2 tab) every other day. See if that works as well.  Please call if we can help

## 2019-05-30 NOTE — Assessment & Plan Note (Signed)
Benefits from Bipap and meeting goals, but encouraged to use it all night, every night. Continue BIPAP 10/6

## 2019-05-30 NOTE — Progress Notes (Signed)
HPI  M former smoker with COPD, chronic cough, OSA complicated by Cardiac dysrhythmia/ pacemaker, HBP  Office Spirometry 08/11/14-moderate obstruction/restriction-FVC 2.95/62%, FEV1 2.37/65%, ratio 0.79, FEF 25-75% 2.43/77% PFT 10/26/2017-normal flows without response to bronchodilator, moderate diffusion defect. Walk Test O2 Qualifying-04/26/2018-qualified for portable oxygen-during his second lap heart rate reached 112 and saturation fell to 86% on room air Labs 04/26/2018- IgE 464, EOS wnl HST 05/17/18>> Severe obstructive sleep apnea AHI:33 -----------------------------------------------------------------------------------------    01/27/2019- 74 year old male former smoker followed for COPD, restrictive lung disease, cough, OSA, complicated by cardiac arrhythmia/pacemaker/ ICD, HBP, DM HST 05/17/18>> Severe obstructive sleep apnea AHI: 33/ hr CPAP auto 5-20/ [employer] 04/26/2018- IgE 464, EOS wnl O2  2L-  Sleep and prn/ [employer] Download-  Body weight today 218 lbs -----OSA on CPAP auto 5-20, DME: [facility]; pt reports increased airflow at times that cause him discomfort He dislikes CPAP but tries to use as directed. No teeth so can't use an oral appliance.  Cough much better- on Trelegy and prednisone 10 mg QOD. Scant white phlegm. Some DOE with hard work outdoors.  Exertional chest pain relieved by rest, noted with yard work. He hasn't notified cardiology and does not have NTG.  05/30/19- 74 year old male former smoker followed for COPD, restrictive lung disease, cough, OSA, complicated by cardiac arrhythmia/pacemaker/ ICD, HBP, DM CPAP to BIPAP titration 02/18/2019-  BIPAP 10/6, if not tolerated try CPAP 7  CPAP BIPAP 10/6/ [facility] O2 2L sleep and prn/ [employer] Download compliance 70%, AHI 4.6/ hr Body weight 225 lbs Trelegy 100, Proair hfa, prednisone 10 mg QOD, Singulair, neb Duoneb,  Again reports dyspnea, may have to stop to rest, climbing hill  from daughter's trailer. Little cough or wheeze.  Has been skipping O2 at night some- agrees to [place] on BIPAP Feels he still needs prednisone every other day, but agrees to try lower dose.  Had episode of pressing anterior chest pain into bilateral neck once after driving to daughter's. Relieved quickly by 2 NTG. Has seen cardiology, but not sure he told them about this and I pressed him to do so. 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   ROS-see HPI   + = positive Constitutional:    weight loss, night sweats, fevers, chills, fatigue, lassitude. HEENT:    headaches, difficulty swallowing, tooth/dental problems, sore throat,       sneezing, itching, ear ache, +nasal congestion, +post nasal drip, snoring CV:    +chest pain, orthopnea, PND, swelling in lower extremities, anasarca,                                                         dizziness, palpitations Resp:   +shortness of breath with exertion or at rest.                productive cough,   + non-productive cough, coughing up of blood.              change in color of mucus.  +wheezing.   Skin:    rash or lesions. GI:  No-   heartburn, indigestion, abdominal pain, nausea, vomiting, GU:  MS:   joint pain, stiffness,  Neuro-     nothing unusual Psych:  change in mood or affect.  depression or anxiety.   memory loss.  OBJ- Physical Exam General-  Alert, Oriented, Affect-appropriate, Distress- none acute, + overweight Skin- rash-none, lesions- none, excoriation- none Lymphadenopathy- none Head- atraumatic            Eyes- Gross vision intact, PERRLA, conjunctivae and secretions clear            Ears- Hearing, canals-normal            Nose- Clear, no-Septal dev, mucus, polyps, erosion, perforation             Throat- Mallampati II , mucosa clear , drainage-none, tonsils- atrophic, edentulous+, not hoarse Neck- flexible , trachea midline, no stridor , thyroid nl, carotid no bruit Chest - symmetrical excursion , unlabored            Heart/CV- RRR , no murmur , no gallop  , no rub, nl s1 s2                           - JVD- none , edema- none, stasis changes- none, varices- none           Lung- , wheeze-none, cough- none, dullness-none, rub- none,            Chest wall- +L pacemaker Abd-  Br/ Gen/ Rectal- Not done, not indicated Extrem- cyanosis- none, clubbing, none, atrophy- none, strength- nl, +cane Neuro- grossly intact to observation

## 2019-05-30 NOTE — Assessment & Plan Note (Signed)
He has skipped sleep O2 occasionally. Plan- overnight oximetry on his BIPAP with room air.

## 2019-06-01 ENCOUNTER — Telehealth: Payer: Self-pay | Admitting: Internal Medicine

## 2019-06-01 NOTE — Telephone Encounter (Signed)
OV note has been faxed to [facility] with [facility]

## 2019-07-04 ENCOUNTER — Ambulatory Visit (INDEPENDENT_AMBULATORY_CARE_PROVIDER_SITE_OTHER): Payer: Medicare Other | Admitting: *Deleted

## 2019-07-04 DIAGNOSIS — I495 Sick sinus syndrome: Secondary | ICD-10-CM | POA: Diagnosis not present

## 2019-07-04 LAB — CUP PACEART REMOTE DEVICE CHECK
Battery Impedance: 231 Ohm
Battery Remaining Longevity: 111 mo
Battery Voltage: 2.78 V
Brady Statistic AP VP Percent: 7 %
Brady Statistic AP VS Percent: 87 %
Brady Statistic AS VP Percent: 0 %
Brady Statistic AS VS Percent: 6 %
Date Time Interrogation Session: 20210301170735
Implantable Lead Implant Date: 20070820
Implantable Lead Implant Date: 20070820
Implantable Lead Location: 753859
Implantable Lead Location: 753860
Implantable Lead Model: 4092
Implantable Lead Model: 5076
Implantable Pulse Generator Implant Date: 20170222
Lead Channel Impedance Value: 467 Ohm
Lead Channel Impedance Value: 588 Ohm
Lead Channel Pacing Threshold Amplitude: 0.875 V
Lead Channel Pacing Threshold Amplitude: 1 V
Lead Channel Pacing Threshold Pulse Width: 0.4 ms
Lead Channel Pacing Threshold Pulse Width: 0.4 ms
Lead Channel Setting Pacing Amplitude: 2 V
Lead Channel Setting Pacing Amplitude: 2.5 V
Lead Channel Setting Pacing Pulse Width: 0.4 ms
Lead Channel Setting Sensing Sensitivity: 1.4 mV

## 2019-07-04 NOTE — Progress Notes (Signed)
PPM Remote  

## 2019-07-09 ENCOUNTER — Other Ambulatory Visit: Payer: Self-pay | Admitting: Internal Medicine

## 2019-08-04 ENCOUNTER — Telehealth: Payer: Self-pay | Admitting: Internal Medicine

## 2019-08-04 MED ORDER — NITROGLYCERIN 0.4 MG SL SUBL: 0.4000 mg | SUBLINGUAL_TABLET | SUBLINGUAL | 6 refills | Status: DC | PRN

## 2019-08-04 NOTE — Telephone Encounter (Signed)
Pt's medication was sent to pt's pharmacy as requested. Confirmation received.  °

## 2019-08-04 NOTE — Telephone Encounter (Signed)
 *  STAT* If patient is at the pharmacy, call can be transferred to refill team.   1. Which medications need to be refilled? (please list name of each medication and dose if known) nitroGLYCERIN (NITROSTAT) 0.4 MG SL tablet  2. Which pharmacy/location (including street and city if local pharmacy) is medication to be sent to? [facility] [address] [place] - [address]  3. Do they need a 30 day or 90 day supply? 1 bottle

## 2019-10-02 ENCOUNTER — Other Ambulatory Visit: Payer: Self-pay | Admitting: Internal Medicine

## 2019-10-02 ENCOUNTER — Encounter: Payer: Self-pay | Admitting: Internal Medicine

## 2019-10-05 ENCOUNTER — Ambulatory Visit (INDEPENDENT_AMBULATORY_CARE_PROVIDER_SITE_OTHER): Payer: Medicare Other | Admitting: *Deleted

## 2019-10-05 DIAGNOSIS — I495 Sick sinus syndrome: Secondary | ICD-10-CM | POA: Diagnosis not present

## 2019-10-05 LAB — CUP PACEART REMOTE DEVICE CHECK
Battery Impedance: 255 Ohm
Battery Remaining Longevity: 108 mo
Battery Voltage: 2.78 V
Brady Statistic AP VP Percent: 6 %
Brady Statistic AP VS Percent: 87 %
Brady Statistic AS VP Percent: 0 %
Brady Statistic AS VS Percent: 8 %
Date Time Interrogation Session: 20210602114025
Implantable Lead Implant Date: 20070820
Implantable Lead Implant Date: 20070820
Implantable Lead Location: 753859
Implantable Lead Location: 753860
Implantable Lead Model: 4092
Implantable Lead Model: 5076
Implantable Pulse Generator Implant Date: 20170222
Lead Channel Impedance Value: 467 Ohm
Lead Channel Impedance Value: 623 Ohm
Lead Channel Pacing Threshold Amplitude: 1 V
Lead Channel Pacing Threshold Amplitude: 1 V
Lead Channel Pacing Threshold Pulse Width: 0.4 ms
Lead Channel Pacing Threshold Pulse Width: 0.4 ms
Lead Channel Setting Pacing Amplitude: 2 V
Lead Channel Setting Pacing Amplitude: 2.5 V
Lead Channel Setting Pacing Pulse Width: 0.4 ms
Lead Channel Setting Sensing Sensitivity: 1.4 mV

## 2019-10-07 NOTE — Progress Notes (Signed)
Remote pacemaker transmission.   

## 2019-11-22 ENCOUNTER — Other Ambulatory Visit: Payer: Self-pay | Admitting: Internal Medicine

## 2019-11-26 ENCOUNTER — Other Ambulatory Visit: Payer: Self-pay | Admitting: Internal Medicine

## 2019-11-28 ENCOUNTER — Encounter: Payer: Self-pay | Admitting: Internal Medicine

## 2019-11-28 ENCOUNTER — Other Ambulatory Visit: Payer: Self-pay

## 2019-11-28 ENCOUNTER — Ambulatory Visit: Payer: Medicare Other | Admitting: Internal Medicine

## 2019-11-28 VITALS — BP 140/70 | HR 78 | Temp 97.6°F | Ht 71.65 in | Wt 221.6 lb

## 2019-11-28 DIAGNOSIS — J449 Chronic obstructive pulmonary disease, unspecified: Secondary | ICD-10-CM | POA: Diagnosis not present

## 2019-11-28 DIAGNOSIS — G4733 Obstructive sleep apnea (adult) (pediatric): Secondary | ICD-10-CM

## 2019-11-28 DIAGNOSIS — J9611 Chronic respiratory failure with hypoxia: Secondary | ICD-10-CM | POA: Diagnosis not present

## 2019-11-28 NOTE — Progress Notes (Signed)
HPI  M former smoker with COPD, chronic cough, OSA complicated by Cardiac dysrhythmia/ pacemaker, HBP  Office Spirometry 08/11/14-moderate obstruction/restriction-FVC 2.95/62%, FEV1 2.37/65%, ratio 0.79, FEF 25-75% 2.43/77% PFT 10/26/2017-normal flows without response to bronchodilator, moderate diffusion defect. Walk Test O2 Qualifying-04/26/2018-qualified for portable oxygen-during his second lap heart rate reached 112 and saturation fell to 86% on room air Labs 04/26/2018- IgE 464, EOS wnl HST 05/17/18>> Severe obstructive sleep apnea AHI:33 CPAP to BIPAP titration 02/18/2019-  BIPAP 10/6, if not tolerated try CPAP 73  -----------------------------------------------------------------------------------------    1/281/5- 74 year old male former smoker followed for COPD, restrictive lung disease, cough, OSA, complicated by cardiac arrhythmia/pacemaker/ ICD, HBP, DM CPAP to BIPAP titration 02/18/2019-  BIPAP 10/6, if not tolerated try CPAP 7  CPAP BIPAP 10/6/ [facility] O2 2L sleep and prn/ [employer] Download compliance 70%, AHI 4.6/ hr Body weight 225 lbs Trelegy 100, Proair hfa, prednisone 10 mg QOD, Singulair, neb Duoneb,  Again reports dyspnea, may have to stop to rest, climbing hill from daughter's trailer. Little cough or wheeze.  Has been skipping O2 at night some- agrees to ONOX on BIPAP Feels he still needs prednisone every other day, but agrees to try lower dose.  Had episode of pressing anterior chest pain into bilateral neck once after driving to daughter's. Relieved quickly by 2 NTG. Has seen cardiology, but not sure he told them about this and I pressed him to do so. 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  11/28/19- 74 year old male former smoker followed for COPD, restrictive lung disease, cough, OSA, complicated by cardiac arrhythmia/Pacemaker/ ICD, HBP, DM CPAP BIPAP 10/6/ [facility] -----O2 2L sleep and prn/ [employer]- dc'd- wasn't using and had it picked  up Download compliance 90%, AHI 2.7/ hr Trelegy 100, Proair hfa, prednisone 10 mg QOD, Singulair, neb Duoneb,  -----sob with walking, especially with incline. Body weight today 221 lbs Had 2 Moderna Covax Stable exercise tolerance with DOE on hills or hurrying. He is improved and/ or more used to this now.  Litle cough or wheeze and no acute vents.  CXR 05/30/19-  The heart size and mediastinal contours are within normal limits. Dual lead pacemaker remains in appropriate position. Both lungs are clear. The visualized skeletal structures are unremarkable. IMPRESSION: No active cardiopulmonary disease.  ROS-see HPI   + = positive Constitutional:    weight loss, night sweats, fevers, chills, fatigue, lassitude. HEENT:    headaches, difficulty swallowing, tooth/dental problems, sore throat,       sneezing, itching, ear ache, +nasal congestion, +post nasal drip, snoring CV:    +chest pain, orthopnea, PND, swelling in lower extremities, anasarca,                                                         dizziness, palpitations Resp:   +shortness of breath with exertion or at rest.                productive cough,   + non-productive cough, coughing up of blood.              change in color of mucus.  +wheezing.   Skin:    rash or lesions. GI:  No-   heartburn, indigestion, abdominal pain, nausea, vomiting, GU:  MS:   joint pain, stiffness,  Neuro-     nothing unusual  Psych:  change in mood or affect.  depression or anxiety.   memory loss.  OBJ- Physical Exam General- Alert, Oriented, Affect-appropriate, Distress- none acute, + overweight Skin- rash-none, lesions- none, excoriation- none Lymphadenopathy- none Head- atraumatic            Eyes- Gross vision intact, PERRLA, conjunctivae and secretions clear            Ears- Hearing, canals-normal            Nose- Clear, no-Septal dev, mucus, polyps, erosion, perforation             Throat- Mallampati II , mucosa clear , drainage-none,  tonsils- atrophic, edentulous+, not hoarse Neck- flexible , trachea midline, no stridor , thyroid nl, carotid no bruit Chest - symmetrical excursion , unlabored           Heart/CV- RRR , no murmur , no gallop  , no rub, nl s1 s2                           - JVD- none , edema- none, stasis changes- none, varices- none           Lung- , wheeze-none, cough- none, dullness-none, rub- none,            Chest wall- +L pacemaker Abd-  Br/ Gen/ Rectal- Not done, not indicated Extrem- cyanosis- none, clubbing, none, atrophy- none, strength- nl, +cane Neuro- grossly intact to observation

## 2019-11-28 NOTE — Patient Instructions (Signed)
We cn continue BIPAP 10/6, mask of choice, humidifier, supplies, AirView/ card  We can continue current meds.  Please call if we can help

## 2019-11-30 ENCOUNTER — Encounter: Payer: Self-pay | Admitting: Internal Medicine

## 2019-11-30 NOTE — Assessment & Plan Note (Signed)
Satisfied with Trelegy. He feels stable and understands to pace himself. Plan- continue Trelegy

## 2019-11-30 NOTE — Assessment & Plan Note (Signed)
He benefits from his BIPAP with good compliance and control. Download reviewed with him. Plan- continue 10/6

## 2019-11-30 NOTE — Assessment & Plan Note (Signed)
He wasn't using O2 and turned it in. He is stable or slightly improved.  Plan- reassess as needed

## 2020-01-02 ENCOUNTER — Ambulatory Visit (INDEPENDENT_AMBULATORY_CARE_PROVIDER_SITE_OTHER): Payer: Medicare Other | Admitting: *Deleted

## 2020-01-02 DIAGNOSIS — I495 Sick sinus syndrome: Secondary | ICD-10-CM

## 2020-01-02 LAB — CUP PACEART REMOTE DEVICE CHECK
Battery Impedance: 280 Ohm
Battery Remaining Longevity: 106 mo
Battery Voltage: 2.78 V
Brady Statistic AP VP Percent: 5 %
Brady Statistic AP VS Percent: 88 %
Brady Statistic AS VP Percent: 0 %
Brady Statistic AS VS Percent: 6 %
Date Time Interrogation Session: 20210830153929
Implantable Lead Implant Date: 20070820
Implantable Lead Implant Date: 20070820
Implantable Lead Location: 753859
Implantable Lead Location: 753860
Implantable Lead Model: 4092
Implantable Lead Model: 5076
Implantable Pulse Generator Implant Date: 20170222
Lead Channel Impedance Value: 480 Ohm
Lead Channel Impedance Value: 580 Ohm
Lead Channel Pacing Threshold Amplitude: 0.875 V
Lead Channel Pacing Threshold Amplitude: 1 V
Lead Channel Pacing Threshold Pulse Width: 0.4 ms
Lead Channel Pacing Threshold Pulse Width: 0.4 ms
Lead Channel Setting Pacing Amplitude: 2 V
Lead Channel Setting Pacing Amplitude: 2.5 V
Lead Channel Setting Pacing Pulse Width: 0.4 ms
Lead Channel Setting Sensing Sensitivity: 1.4 mV

## 2020-01-04 NOTE — Progress Notes (Signed)
Remote pacemaker transmission.   

## 2020-01-06 ENCOUNTER — Other Ambulatory Visit: Payer: Self-pay | Admitting: Internal Medicine

## 2020-01-06 NOTE — Telephone Encounter (Signed)
Pt is requesting prednisone 10 mg next f/u 11/27/20

## 2020-01-08 NOTE — Telephone Encounter (Signed)
Prednisone refilled

## 2020-03-09 ENCOUNTER — Encounter (INDEPENDENT_AMBULATORY_CARE_PROVIDER_SITE_OTHER): Payer: Medicare Other | Admitting: Ophthalmology

## 2020-03-12 ENCOUNTER — Encounter (INDEPENDENT_AMBULATORY_CARE_PROVIDER_SITE_OTHER): Payer: Medicare Other | Admitting: Ophthalmology

## 2020-03-12 ENCOUNTER — Other Ambulatory Visit: Payer: Self-pay

## 2020-03-12 DIAGNOSIS — H35033 Hypertensive retinopathy, bilateral: Secondary | ICD-10-CM | POA: Diagnosis not present

## 2020-03-12 DIAGNOSIS — E113293 Type 2 diabetes mellitus with mild nonproliferative diabetic retinopathy without macular edema, bilateral: Secondary | ICD-10-CM | POA: Diagnosis not present

## 2020-03-12 DIAGNOSIS — E11319 Type 2 diabetes mellitus with unspecified diabetic retinopathy without macular edema: Secondary | ICD-10-CM | POA: Diagnosis not present

## 2020-03-12 DIAGNOSIS — H43813 Vitreous degeneration, bilateral: Secondary | ICD-10-CM

## 2020-03-12 DIAGNOSIS — I10 Essential (primary) hypertension: Secondary | ICD-10-CM

## 2020-04-02 ENCOUNTER — Ambulatory Visit (INDEPENDENT_AMBULATORY_CARE_PROVIDER_SITE_OTHER): Payer: Medicare Other

## 2020-04-02 DIAGNOSIS — I471 Supraventricular tachycardia: Secondary | ICD-10-CM | POA: Diagnosis not present

## 2020-04-04 LAB — CUP PACEART REMOTE DEVICE CHECK
Battery Impedance: 280 Ohm
Battery Remaining Longevity: 106 mo
Battery Voltage: 2.79 V
Brady Statistic AP VP Percent: 5 %
Brady Statistic AP VS Percent: 88 %
Brady Statistic AS VP Percent: 0 %
Brady Statistic AS VS Percent: 7 %
Date Time Interrogation Session: 20211130165226
Implantable Lead Implant Date: 20070820
Implantable Lead Implant Date: 20070820
Implantable Lead Location: 753859
Implantable Lead Location: 753860
Implantable Lead Model: 4092
Implantable Lead Model: 5076
Implantable Pulse Generator Implant Date: 20170222
Lead Channel Impedance Value: 474 Ohm
Lead Channel Impedance Value: 630 Ohm
Lead Channel Pacing Threshold Amplitude: 0.875 V
Lead Channel Pacing Threshold Amplitude: 1 V
Lead Channel Pacing Threshold Pulse Width: 0.4 ms
Lead Channel Pacing Threshold Pulse Width: 0.4 ms
Lead Channel Setting Pacing Amplitude: 2 V
Lead Channel Setting Pacing Amplitude: 2.5 V
Lead Channel Setting Pacing Pulse Width: 0.4 ms
Lead Channel Setting Sensing Sensitivity: 1.4 mV

## 2020-04-06 NOTE — Progress Notes (Signed)
Remote pacemaker transmission.   

## 2020-04-15 ENCOUNTER — Other Ambulatory Visit: Payer: Self-pay | Admitting: Physician Assistant

## 2020-04-16 ENCOUNTER — Telehealth: Payer: Self-pay | Admitting: Internal Medicine

## 2020-04-16 MED ORDER — PREDNISONE 10 MG PO TABS: ORAL_TABLET | ORAL | 0 refills | Status: DC

## 2020-04-16 MED ORDER — AZITHROMYCIN 250 MG PO TABS: ORAL_TABLET | ORAL | 0 refills | Status: DC

## 2020-04-16 NOTE — Telephone Encounter (Signed)
Called and spoke with pt letting him know the info stated by CY and he verbalized understanding. Verified preferred pharmacy and sent both meds in for pt. Nothing further needed.

## 2020-04-16 NOTE — Telephone Encounter (Signed)
Please offer Zpak 250 mg, # 6, 2 today then one daily                     Prednisone 10 mg, # 20, 4 X 2 DAYS, 3 X 2 DAYS, 2 X 2 DAYS, 1 X 2 DAYS  After the prednisone taper, go back to alternate day prednisone as before

## 2020-04-16 NOTE — Telephone Encounter (Signed)
Primary Pulmonologist: Young Last office visit and with whom: 11/28/19 with Young What do we see them for (pulmonary problems): OSA, COPD Last OV assessment/plan: Assessment & Plan Note by Waymon Budge, MD at 11/30/2019 9:13 PM  Author: Waymon Budge, MD Author Type: Physician Filed: 11/30/2019 9:14 PM  Note Status: Written Cosign: Cosign Not Required Encounter Date: 11/28/2019  Problem: Chronic respiratory failure with hypoxia [facility])  Editor: Waymon Budge, MD (Physician)             He wasn't using O2 and turned it in. He is stable or slightly improved.  Plan- reassess as needed       Assessment & Plan Note by Waymon Budge, MD at 11/30/2019 9:12 PM  Author: Waymon Budge, MD Author Type: Physician Filed: 11/30/2019 9:13 PM  Note Status: Written Cosign: Cosign Not Required Encounter Date: 11/28/2019  Problem: COPD with chronic bronchitis (HCC)  Editor: Waymon Budge, MD (Physician)             Satisfied with Trelegy. He feels stable and understands to pace himself. Plan- continue Trelegy       Assessment & Plan Note by Waymon Budge, MD at 11/30/2019 9:11 PM  Author: Waymon Budge, MD Author Type: Physician Filed: 11/30/2019 9:12 PM  Note Status: Written Cosign: Cosign Not Required Encounter Date: 11/28/2019  Problem: OSA (obstructive sleep apnea)  Editor: Waymon Budge, MD (Physician)             He benefits from his BIPAP with good compliance and control. Download reviewed with him. Plan- continue 10/6       Patient Instructions by Waymon Budge, MD at 11/28/2019 11:00 AM  Author: Waymon Budge, MD Author Type: Physician Filed: 11/28/2019 11:24 AM  Note Status: Signed Cosign: Cosign Not Required Encounter Date: 11/28/2019  Editor: Waymon Budge, MD (Physician)             We cn continue BIPAP 10/6, mask of choice, humidifier, supplies, AirView/ card  We can continue current meds.  Please call if we can help        Was  appointment offered to patient (explain)?  Pt requesting meds   Reason for call: Called and spoke with pt who stated he began having complaints of cough about 5 days ago. Pt said occ he will get up yellow phlegm. Pt has also had complaints of wheezing and also has discomfort in chest from coughing.  Pt states his cough is worse at night and states even with his CPAP, his cough is keeping him from being able to sleep. Asked pt if he is still able to use his CPAP and he states most nights he is able to use it but only for about a couple hours and he said there was one night recently that he was not able to use it at all.  Pt denies any complaints of fever.  Pt is still taking prednisone every other day as prescribed and also is using the Trelegy Ellipta inhaler and all other meds as prescribed.  Pt has been doing neb tx about every 4 hours and also states he has used rescue inhaler about 3-4 times daily.  Pt is requesting something to be called in to help with his cough. Pt also wants to know if he might need to have a pred taper for now instead of just taking the prednisone every other day.  Dr. Maple Hudson, please advise.   Allergies  Allergen Reactions  . Penicillins Anaphylaxis    Has patient had a PCN reaction causing immediate rash, facial/tongue/throat swelling, SOB or lightheadedness with hypotension: Yes Has patient had a PCN reaction causing severe rash involving mucus membranes or skin necrosis: No Has patient had a PCN reaction that required hospitalization: Yes Has patient had a PCN reaction occurring within the last 10 years: No If all of the above answers are "NO", then may proceed with Cephalosporin use.   [name] Lopressor [Metoprolol Tartrate]     " It drops my heart rate."  . Codeine Rash    Immunization History  Administered Date(s) Administered  . Fluad Quad(high Dose 65+) 01/27/2019  . Influenza Split 02/02/2014, 02/24/2017  . Influenza, High Dose Seasonal PF 02/01/2016,  02/04/2017, 01/17/2018  . Influenza,inj,Quad PF,6+ Mos 01/27/2013, 05/07/2015  . Pneumococcal Conjugate-13 08/25/2014  . Pneumococcal Polysaccharide-23 09/25/2017  . Pneumococcal-Unspecified 02/02/2014  . Td 08/05/2006  . Tdap 08/05/2006    Current Outpatient Medications:  .  acetaminophen (TYLENOL) 650 MG CR tablet, Take 1,300 mg daily as needed by mouth for pain. , Disp: , Rfl:  .  aspirin 81 MG tablet, Take 81 mg by mouth daily., Disp: , Rfl:  .  atorvastatin (LIPITOR) 40 MG tablet, Take 40 mg by mouth daily., Disp: , Rfl:  .  CALCIUM-MAGNESIUM-ZINC PO, Take 1 capsule by mouth daily., Disp: , Rfl:  .  diltiazem (CARDIZEM SR) 120 MG 12 hr capsule, Take 1 capsule by mouth once daily, Disp: 90 capsule, Rfl: 2 .  docusate sodium (COLACE) 100 MG capsule, Take 100 mg by mouth 2 (two) times daily., Disp: , Rfl:  .  famotidine (PEPCID) 20 MG tablet, TAKE ONE TABLET BY MOUTH ONCE DAILY AT BEDTIME, Disp: 30 tablet, Rfl: 0 .  gabapentin (NEURONTIN) 300 MG capsule, Take 300 mg by mouth 2 (two) times daily., Disp: , Rfl:  .  glimepiride (AMARYL) 1 MG tablet, Take 1 tablet by mouth daily., Disp: , Rfl:  .  guaiFENesin (MUCINEX) 600 MG 12 hr tablet, Take 1 tablet (600 mg total) by mouth 2 (two) times daily., Disp: 30 tablet, Rfl: 3 .  hydrochlorothiazide (HYDRODIURIL) 12.5 MG tablet, Take 12.5 mg daily by mouth., Disp: , Rfl:  .  ipratropium-albuterol (DUONEB) 0.5-2.5 (3) MG/3ML SOLN, Inhale 3 mLs into the lungs every 6 (six) hours as needed (wheezing or shortness of breath). , Disp: , Rfl:  .  ketorolac (ACULAR) 0.5 % ophthalmic solution, Place 1 drop into the left eye 4 (four) times daily., Disp: , Rfl:  .  levocetirizine (XYZAL) 5 MG tablet, Take 5 mg at bedtime by mouth. , Disp: , Rfl:  .  montelukast (SINGULAIR) 10 MG tablet, Take 10 mg at bedtime by mouth. , Disp: , Rfl:  .  Multiple Vitamin (MULTIVITAMIN WITH MINERALS) TABS tablet, Take 1 tablet by mouth daily., Disp: , Rfl:  .  nitroGLYCERIN  (NITROSTAT) 0.4 MG SL tablet, Place 1 tablet (0.4 mg total) under the tongue every 5 (five) minutes as needed for chest pain., Disp: 25 tablet, Rfl: 6 .  olmesartan (BENICAR) 20 MG tablet, Take 20 mg daily by mouth., Disp: , Rfl:  .  pantoprazole (PROTONIX) 40 MG tablet, TAKE ONE TABLET BY MOUTH ONCE DAILY TAKE  30-60  MINUTES  BEFORE  FIRST  MEAL  OF  THE  DAY, Disp: 30 tablet, Rfl: 0 .  predniSONE (DELTASONE) 10 MG tablet, TAKE 1 TABLET BY MOUTH EVERY OTHER DAY, Disp: 20 tablet, Rfl: 5 .  PROAIR  HFA 108 (90 BASE) MCG/ACT inhaler, Inhale 2 puffs into the lungs 4 (four) times daily as needed., Disp: , Rfl:  .  Respiratory Therapy Supplies (FLUTTER) DEVI, Blow through 4 times per set, 3 sets per day when needed to clear lungs, Disp: 1 each, Rfl: 0 .  TRELEGY ELLIPTA 100-62.5-25 MCG/INH AEPB, USE 1 INHALATION BY MOUTH  DAILY, Disp: 180 each, Rfl: 3

## 2020-04-17 MED ORDER — DILTIAZEM HCL ER 120 MG PO CP12: 120.0000 mg | ORAL_CAPSULE | Freq: Every day | ORAL | 0 refills | Status: DC

## 2020-04-20 ENCOUNTER — Telehealth: Payer: Self-pay | Admitting: Internal Medicine

## 2020-04-20 MED ORDER — DOXYCYCLINE HYCLATE 100 MG PO TABS: 100.0000 mg | ORAL_TABLET | Freq: Two times a day (BID) | ORAL | 0 refills | Status: DC

## 2020-04-20 NOTE — Telephone Encounter (Signed)
Being vaccinated doesn't completely exclude possibility of Covid infection, or he may have some other infection, or something else.  If he doesn't get better soon he should go to ER, where they can better assess. We can offer doxycycline 100 mg, # 14, 1 twice daily. If he didn't feel any better from prednisone taper, he should just stay on his every other day prednisone. He has had a nebulizer machine. He can try using that up to every 6 hours as needed. Ok to refill Duoneb if ne needs that.

## 2020-04-20 NOTE — Telephone Encounter (Signed)
Spoke with patient. He verbalized understanding of recommendations. He stated that he still has his nebulizer and DuoNebs. He was in the middle of a treatment before I called him. He is aware that if the doxy does not help, he needs to go to the ER.   Doxy has been sent in. Nothing further needed at time of call.

## 2020-04-20 NOTE — Telephone Encounter (Signed)
Called and spoke with patient. He stated that [facility] had written a RX for a prednisone taper and zpak on 04/16/20. He is not feeling any better. He has a cough that is non-productive. Increased SOB. Also has a runny nose with yellow discharge. He denies being around anyone sick recently. He is vaccinated. Also denied any fevers, body aches or chills. He is concerned because the weekend is coming up and he is not feeling any better.   Pharmacy is [facility] in [place].   CY, please advise. Thanks!

## 2020-04-24 ENCOUNTER — Emergency Department (HOSPITAL_BASED_OUTPATIENT_CLINIC_OR_DEPARTMENT_OTHER)
Admission: EM | Admit: 2020-04-24 | Discharge: 2020-04-24 | Disposition: A | Discharge status: Home / Self Care | Payer: Medicare Other | Attending: Emergency Medicine | Admitting: Emergency Medicine

## 2020-04-24 ENCOUNTER — Emergency Department (HOSPITAL_BASED_OUTPATIENT_CLINIC_OR_DEPARTMENT_OTHER): Payer: Medicare Other

## 2020-04-24 ENCOUNTER — Telehealth: Payer: Self-pay | Admitting: Internal Medicine

## 2020-04-24 ENCOUNTER — Other Ambulatory Visit: Payer: Self-pay

## 2020-04-24 ENCOUNTER — Encounter (HOSPITAL_BASED_OUTPATIENT_CLINIC_OR_DEPARTMENT_OTHER): Payer: Self-pay | Admitting: *Deleted

## 2020-04-24 DIAGNOSIS — N189 Chronic kidney disease, unspecified: Secondary | ICD-10-CM | POA: Diagnosis not present

## 2020-04-24 DIAGNOSIS — Z79899 Other long term (current) drug therapy: Secondary | ICD-10-CM | POA: Insufficient documentation

## 2020-04-24 DIAGNOSIS — I129 Hypertensive chronic kidney disease with stage 1 through stage 4 chronic kidney disease, or unspecified chronic kidney disease: Secondary | ICD-10-CM | POA: Insufficient documentation

## 2020-04-24 DIAGNOSIS — Z7982 Long term (current) use of aspirin: Secondary | ICD-10-CM | POA: Insufficient documentation

## 2020-04-24 DIAGNOSIS — R059 Cough, unspecified: Secondary | ICD-10-CM | POA: Insufficient documentation

## 2020-04-24 DIAGNOSIS — Z20822 Contact with and (suspected) exposure to covid-19: Secondary | ICD-10-CM | POA: Diagnosis not present

## 2020-04-24 DIAGNOSIS — Z950 Presence of cardiac pacemaker: Secondary | ICD-10-CM | POA: Diagnosis not present

## 2020-04-24 DIAGNOSIS — E1122 Type 2 diabetes mellitus with diabetic chronic kidney disease: Secondary | ICD-10-CM | POA: Diagnosis not present

## 2020-04-24 DIAGNOSIS — R0602 Shortness of breath: Secondary | ICD-10-CM | POA: Insufficient documentation

## 2020-04-24 DIAGNOSIS — Z7984 Long term (current) use of oral hypoglycemic drugs: Secondary | ICD-10-CM | POA: Insufficient documentation

## 2020-04-24 DIAGNOSIS — J449 Chronic obstructive pulmonary disease, unspecified: Secondary | ICD-10-CM | POA: Insufficient documentation

## 2020-04-24 DIAGNOSIS — J441 Chronic obstructive pulmonary disease with (acute) exacerbation: Secondary | ICD-10-CM

## 2020-04-24 DIAGNOSIS — E871 Hypo-osmolality and hyponatremia: Secondary | ICD-10-CM

## 2020-04-24 DIAGNOSIS — R062 Wheezing: Secondary | ICD-10-CM | POA: Diagnosis not present

## 2020-04-24 DIAGNOSIS — Z87891 Personal history of nicotine dependence: Secondary | ICD-10-CM | POA: Diagnosis not present

## 2020-04-24 LAB — COMPREHENSIVE METABOLIC PANEL
ALT: 20 U/L (ref 0–44)
AST: 18 U/L (ref 15–41)
Albumin: 4 g/dL (ref 3.5–5.0)
Alkaline Phosphatase: 55 U/L (ref 38–126)
Anion gap: 11 (ref 5–15)
BUN: 21 mg/dL (ref 8–23)
CO2: 27 mmol/L (ref 22–32)
Calcium: 9.2 mg/dL (ref 8.9–10.3)
Chloride: 91 mmol/L — ABNORMAL LOW (ref 98–111)
Creatinine, Ser: 1.18 mg/dL (ref 0.61–1.24)
GFR, Estimated: >60 mL/min (ref >60)
Glucose, Bld: 252 mg/dL — ABNORMAL HIGH (ref 70–99)
Potassium: 4.2 mmol/L (ref 3.5–5.1)
Sodium: 129 mmol/L — ABNORMAL LOW (ref 135–145)
Total Bilirubin: 0.5 mg/dL (ref 0.3–1.2)
Total Protein: 7.5 g/dL (ref 6.5–8.1)

## 2020-04-24 LAB — CBC WITH DIFFERENTIAL/PLATELET
Abs Immature Granulocytes: 0.06 10*3/uL (ref 0.00–0.07)
Basophils Absolute: 0 10*3/uL (ref 0.0–0.1)
Basophils Relative: 0 %
Eosinophils Absolute: 0 10*3/uL (ref 0.0–0.5)
Eosinophils Relative: 0 %
HCT: 42.3 % (ref 39.0–52.0)
Hemoglobin: 14.4 g/dL (ref 13.0–17.0)
Immature Granulocytes: 1 %
Lymphocytes Relative: 28 %
Lymphs Abs: 2.8 10*3/uL (ref 0.7–4.0)
MCH: 30.4 pg (ref 26.0–34.0)
MCHC: 34 g/dL (ref 30.0–36.0)
MCV: 89.4 fL (ref 80.0–100.0)
Monocytes Absolute: 0.9 10*3/uL (ref 0.1–1.0)
Monocytes Relative: 9 %
Neutro Abs: 6.2 10*3/uL (ref 1.7–7.7)
Neutrophils Relative %: 62 %
Platelets: 276 10*3/uL (ref 150–400)
RBC: 4.73 MIL/uL (ref 4.22–5.81)
RDW: 13.6 % (ref 11.5–15.5)
WBC: 10 10*3/uL (ref 4.0–10.5)
nRBC: 0 % (ref 0.0–0.2)

## 2020-04-24 LAB — I-STAT VENOUS BLOOD GAS, ED
Acid-Base Excess: 7 mmol/L — ABNORMAL HIGH (ref 0.0–2.0)
Bicarbonate: 31.4 mmol/L — ABNORMAL HIGH (ref 20.0–28.0)
Calcium, Ion: 1.13 mmol/L — ABNORMAL LOW (ref 1.15–1.40)
HCT: 40 % (ref 39.0–52.0)
Hemoglobin: 13.6 g/dL (ref 13.0–17.0)
O2 Saturation: 84 %
Potassium: 4.9 mmol/L (ref 3.5–5.1)
Sodium: 129 mmol/L — ABNORMAL LOW (ref 135–145)
TCO2: 33 mmol/L — ABNORMAL HIGH (ref 22–32)
pCO2, Ven: 41.7 mmHg — ABNORMAL LOW (ref 44.0–60.0)
pH, Ven: 7.485 — ABNORMAL HIGH (ref 7.250–7.430)
pO2, Ven: 46 mmHg — ABNORMAL HIGH (ref 32.0–45.0)

## 2020-04-24 LAB — RESP PANEL BY RT-PCR (FLU A&B, COVID) ARPGX2
Influenza A by PCR: NEGATIVE
Influenza B by PCR: NEGATIVE
SARS Coronavirus 2 by RT PCR: NEGATIVE

## 2020-04-24 LAB — BRAIN NATRIURETIC PEPTIDE: B Natriuretic Peptide: 43.6 pg/mL (ref 0.0–100.0)

## 2020-04-24 LAB — TROPONIN I (HIGH SENSITIVITY): Troponin I (High Sensitivity): 6 ng/L (ref <18)

## 2020-04-24 MED ORDER — IPRATROPIUM BROMIDE 0.02 % IN SOLN
1.0000 mg | Freq: Once | RESPIRATORY_TRACT | Status: AC
  Administered 2020-04-24: 1 mg via RESPIRATORY_TRACT
  Filled 2020-04-24: qty 5

## 2020-04-24 MED ORDER — PREDNISONE 10 MG PO TABS: ORAL_TABLET | ORAL | 0 refills | Status: AC

## 2020-04-24 MED ORDER — MAGNESIUM SULFATE 2 GM/50ML IV SOLN
2.0000 g | Freq: Once | INTRAVENOUS | Status: AC
  Administered 2020-04-24: 2 g via INTRAVENOUS
  Filled 2020-04-24: qty 50

## 2020-04-24 MED ORDER — SODIUM CHLORIDE 0.9 % IV SOLN
INTRAVENOUS | Status: DC | PRN
  Administered 2020-04-24: 500 mL via INTRAVENOUS

## 2020-04-24 MED ORDER — METHYLPREDNISOLONE SODIUM SUCC 125 MG IJ SOLR
125.0000 mg | Freq: Once | INTRAMUSCULAR | Status: AC
  Administered 2020-04-24: 125 mg via INTRAVENOUS
  Filled 2020-04-24: qty 2

## 2020-04-24 MED ORDER — ALBUTEROL SULFATE (2.5 MG/3ML) 0.083% IN NEBU
5.0000 mg | INHALATION_SOLUTION | Freq: Once | RESPIRATORY_TRACT | Status: AC
  Administered 2020-04-24: 5 mg via RESPIRATORY_TRACT
  Filled 2020-04-24: qty 6

## 2020-04-24 NOTE — Telephone Encounter (Signed)
Spoke with daughter (dpr on file), states she called [facility] ED and was advised that they are full and not to bring pt unless "it is life or death".  I advised that pt has been advised multiple times by our office to present to ED d/t worsening dyspnea despite prednisone and multiple abx since 12/13.  Daughter is apprehensive taking pt to a hospital with current Covid numbers.  Patient is requesting any additional recs from CY to see if pt can stay out of the ED.  Daughter is on her way to evaluate patient herself.    CY please advise if you have any additional recs for pt.  Pt has been advised multiple times to present to ED.  Thanks!

## 2020-04-24 NOTE — ED Provider Notes (Signed)
[facility] EMERGENCY DEPARTMENT Provider Note   CSN: [id number] Arrival date & time: 04/24/20  1631     History Chief Complaint  Patient presents with  . Shortness of Breath    Erick ColaceMarion C Whittenburg is a 74 y.o. male history of COPD, diabetes, reflux, hypertension here presenting with cough and shortness of breath.  Patient has been having symptoms for the last 2 weeks or so.  He initially finished Z-Pak and a prednisone taper.  He initially felt better but then felt worse so called the pulmonologist and was put on doxycycline for a week starting on 12/17.  Patient's has been using his DuoNeb with no relief.  He is on his baseline prednisone of 5 mg.  Patient states that he has persistent cough and shortness of breath.  He called his pulmonologist again today and was sent in for rule out Covid and chest x-ray and labs.  Patient has no known Covid exposure.  Patient did get his vaccines plus a booster.   The history is provided by the patient.       Past Medical History:  Diagnosis Date  . Arthritis   . Bradycardia    pacemaker - Medtronic Adapta #[id number] - December 22, 2005  . Carpal tunnel syndrome on both sides   . Carpal tunnel syndrome, bilateral   . Chronic back pain   . Chronic kidney disease   . COPD (chronic obstructive pulmonary disease) (HCC)   . Depression   . Diabetes mellitus    type II controled with diet  . GERD (gastroesophageal reflux disease)   . Headache(784.0)   . Hyperlipidemia   . Hypertension   . Insomnia   . Neuromuscular disorder (HCC)    peripheral neuropathy BLE  . Pneumonia   . Presence of permanent cardiac pacemaker   . Wears glasses     Patient Active Problem List   Diagnosis Date Noted  . Angina pectoris (HCC) 01/27/2019  . Peripheral arterial disease (HCC) 11/05/2018  . OSA (obstructive sleep apnea) 07/30/2018  . Chronic respiratory failure with hypoxia (HCC) 04/26/2018  . GERD (gastroesophageal reflux disease) 10/26/2017  .  Acute bronchiolitis 06/18/2017  . Ulnar nerve compression 03/24/2017  . COPD with chronic bronchitis (HCC) 12/15/2014  . Upper airway cough syndrome 03/16/2014  . DJD (degenerative joint disease) of hip 01/25/2013    Class: Chronic  . 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 Pacemaker- DDD-Medtronic 09/24/2010  . Elevated blood pressure 09/24/2010  . HYPERLIPIDEMIA, BORDERLINE 06/24/2010    Past Surgical History:  Procedure Laterality Date  . BACK SURGERY    . CARPAL TUNNEL RELEASE Right 03/24/2017   Procedure: Right CARPAL TUNNEL RELEASE;  Surgeon: Maeola HarmanStern, Joseph, MD;  [facility];  Service: Neurosurgery;  Laterality: Right;  Right CARPAL TUNNEL RELEASE  . EP IMPLANTABLE DEVICE N/A 06/27/2015   Procedure:  PPM Generator Changeout;  Surgeon: Duke SalviaSteven C Klein, MD;  Location[facility] INVASIVE CV LAB;  Service: Cardiovascular;  Laterality: N/A;  . HEMORRHOID SURGERY    . INSERT / REPLACE / REMOVE PACEMAKER    . MULTIPLE TOOTH EXTRACTIONS    . PACEMAKER INSERTION  ?2007  . TOTAL HIP ARTHROPLASTY Right 01/25/2013   Procedure: TOTAL HIP ARTHROPLASTY ANTERIOR APPROACH;  Surgeon: Velna OchsPeter G Dalldorf, MD;  Location: [facility] OR;  Service: Orthopedics;  Laterality: Right;  . ULNAR NERVE TRANSPOSITION Right 03/24/2017   Procedure: Right Ulnar nerve release;  Surgeon: Maeola HarmanStern, Joseph, MD;  [name];  Service: Neurosurgery;  Laterality: Right;  Right Ulnar nerve release       Family History  Problem Relation Age of Onset  . Heart disease Mother   . Asthma Maternal Aunt   . Allergies Other        whole family    Social History   Tobacco Use  . Smoking status: Former Smoker    Packs/day: 2.00    Years: 15.00    Pack years: 30.00    Types: Cigarettes    Start date: 05/05/1958    Quit date: 05/05/1973    Years since quitting: 47.0  . Smokeless tobacco: Never Used  Vaping Use  . Vaping Use: Never used  Substance Use Topics  . Alcohol use: No    Alcohol/week:  0.0 standard drinks  . Drug use: No    Home Medications Prior to Admission medications   Medication Sig Start Date End Date Taking? Authorizing Provider  acetaminophen (TYLENOL) 650 MG CR tablet Take 1,300 mg daily as needed by mouth for pain.     [provider]  aspirin 81 MG tablet Take 81 mg by mouth daily.    [provider]  atorvastatin (LIPITOR) 40 MG tablet Take 40 mg by mouth daily.    [provider]  azithromycin (ZITHROMAX) 250 MG tablet Take two today and then one daily until finished. 04/16/20   Waymon Budge, MD  CALCIUM-MAGNESIUM-ZINC PO Take 1 capsule by mouth daily.    [provider]  diltiazem (CARDIZEM SR) 120 MG 12 hr capsule Take 1 capsule (120 mg total) by mouth daily. Please make overdue appt with Dr. Graciela Husbands before anymore refills. Thank you  1st attempt 04/17/20   Duke Salvia, MD  docusate sodium (COLACE) 100 MG capsule Take 100 mg by mouth 2 (two) times daily.    [provider]  doxycycline (VIBRA-TABS) 100 MG tablet Take 1 tablet (100 mg total) by mouth 2 (two) times daily. 04/20/20   Waymon Budge, MD  famotidine (PEPCID) 20 MG tablet TAKE ONE TABLET BY MOUTH ONCE DAILY AT BEDTIME 06/07/14   Nyoka Cowden, MD  gabapentin (NEURONTIN) 300 MG capsule Take 300 mg by mouth 2 (two) times daily.    [provider]  glimepiride (AMARYL) 1 MG tablet Take 1 tablet by mouth daily. 07/27/14   [provider]  guaiFENesin (MUCINEX) 600 MG 12 hr tablet Take 1 tablet (600 mg total) by mouth 2 (two) times daily. 11/16/17   Jetty Duhamel D, MD  hydrochlorothiazide (HYDRODIURIL) 12.5 MG tablet Take 12.5 mg daily by mouth.    [provider]  ipratropium-albuterol (DUONEB) 0.5-2.5 (3) MG/3ML SOLN Inhale 3 mLs into the lungs every 6 (six) hours as needed (wheezing or shortness of breath).  07/31/14   [provider]  ketorolac (ACULAR) 0.5 % ophthalmic solution Place 1 drop into the left eye 4  (four) times daily. 04/30/19   [provider]  levocetirizine (XYZAL) 5 MG tablet Take 5 mg at bedtime by mouth.  07/25/14   [provider]  montelukast (SINGULAIR) 10 MG tablet Take 10 mg at bedtime by mouth.  07/20/14   [provider]  Multiple Vitamin (MULTIVITAMIN WITH MINERALS) TABS tablet Take 1 tablet by mouth daily.    [provider]  nitroGLYCERIN (NITROSTAT) 0.4 MG SL tablet Place 1 tablet (0.4 mg total) under the tongue every 5 (five) minutes as needed for chest pain. 08/04/19 11/02/19  Duke Salvia, MD  olmesartan (BENICAR) 20 MG  tablet Take 20 mg daily by mouth.    [provider]  pantoprazole (PROTONIX) 40 MG tablet TAKE ONE TABLET BY MOUTH ONCE DAILY TAKE  30-60  MINUTES  BEFORE  FIRST  MEAL  OF  THE  DAY 06/07/14   Nyoka Cowden, MD  predniSONE (DELTASONE) 10 MG tablet TAKE 1 TABLET BY MOUTH EVERY OTHER DAY 01/08/20   Waymon Budge, MD  predniSONE (DELTASONE) 10 MG tablet Take 4 tablets (40 mg total) by mouth daily with breakfast for 2 days, THEN 3 tablets (30 mg total) daily with breakfast for 2 days, THEN 2 tablets (20 mg total) daily with breakfast for 2 days, THEN 1 tablet (10 mg total) daily with breakfast for 2 days. 04/16/20 04/24/20  Waymon Budge, MD  PROAIR HFA 108 (90 BASE) MCG/ACT inhaler Inhale 2 puffs into the lungs 4 (four) times daily as needed. 07/27/14   [provider]  Respiratory Therapy Supplies (FLUTTER) DEVI Blow through 4 times per set, 3 sets per day when needed to clear lungs 02/11/16   Waymon Budge, MD  TRELEGY ELLIPTA 100-62.5-25 MCG/INH AEPB USE 1 INHALATION BY MOUTH  DAILY 11/28/19   Waymon Budge, MD    Allergies    Penicillins, Lopressor [metoprolol tartrate], and Codeine  Review of Systems   Review of Systems  Respiratory: Positive for shortness of breath.   All other systems reviewed and are negative.   Physical Exam Updated Vital Signs BP (!) 147/89   Pulse 67   Temp 97.7 F  (36.5 C) (Oral)   Resp 17   Ht 6' (1.829 m)   Wt 102.1 kg   SpO2 100%   BMI 30.52 kg/m   Physical Exam Vitals and nursing note reviewed.  Constitutional:      Appearance: He is well-developed.  HENT:     Head: Normocephalic.     Mouth/Throat:     Mouth: Mucous membranes are moist.  Eyes:     Extraocular Movements: Extraocular movements intact.     Pupils: Pupils are equal, round, and reactive to light.  Cardiovascular:     Rate and Rhythm: Normal rate and regular rhythm.  Pulmonary:     Comments: Tachypneic, expiratory wheezing mostly on the right side, no retractions Abdominal:     General: Bowel sounds are normal.     Palpations: Abdomen is soft.  Musculoskeletal:        General: Normal range of motion.     Cervical back: Normal range of motion and neck supple.  Skin:    General: Skin is warm.     Capillary Refill: Capillary refill takes less than 2 seconds.  Neurological:     General: No focal deficit present.     Mental Status: He is alert and oriented to person, place, and time.  Psychiatric:        Mood and Affect: Mood normal.        Behavior: Behavior normal.     ED Results / Procedures / Treatments   Labs (all labs ordered are listed, but only abnormal results are displayed) Labs Reviewed  COMPREHENSIVE METABOLIC PANEL - Abnormal; Notable for the following components:      Result Value   Sodium 129 (*)    Chloride 91 (*)    Glucose, Bld 252 (*)    All other components within normal limits  I-STAT VENOUS BLOOD GAS, ED - Abnormal; Notable for the following components:   pH, Ven 7.485 (*)  pCO2, Ven 41.7 (*)    pO2, Ven 46.0 (*)    Bicarbonate 31.4 (*)    TCO2 33 (*)    Acid-Base Excess 7.0 (*)    Sodium 129 (*)    Calcium, Ion 1.13 (*)    All other components within normal limits  RESP PANEL BY RT-PCR (FLU A&B, COVID) ARPGX2  CBC WITH DIFFERENTIAL/PLATELET  BRAIN NATRIURETIC PEPTIDE  BLOOD GAS, VENOUS  TROPONIN I (HIGH SENSITIVITY)  TROPONIN  I (HIGH SENSITIVITY)    EKG EKG Interpretation  Date/Time:  Tuesday April 24 2020 16:39:34 EST Ventricular Rate:  71 PR Interval:    QRS Duration: 116 QT Interval:  368 QTC Calculation: 400 R Axis:   -50 Text Interpretation: Sinus rhythm Multiple premature complexes, vent & supraven Left anterior fascicular block PVC new since previous Confirmed by Richardean Canal [phone]) on 04/24/2020 4:46:22 PM   Radiology DG Chest Port 1 View  Result Date: 04/24/2020 CLINICAL DATA:  Short of breath and cough for 2 weeks EXAM: PORTABLE CHEST 1 VIEW COMPARISON:  05/30/2019 FINDINGS: Single frontal view of the chest demonstrates a stable cardiac silhouette. Dual lead pacer unchanged. No airspace disease, effusion, or pneumothorax. IMPRESSION: 1. No acute intrathoracic process. Electronically Signed   By: Sharlet Salina M.D.   On: 04/24/2020 17:04    Procedures Procedures (including critical care time)  Medications Ordered in ED Medications  0.9 %  sodium chloride infusion (500 mLs Intravenous New Bag/Given 04/24/20 1705)  methylPREDNISolone sodium succinate (SOLU-MEDROL) 125 mg/2 mL injection 125 mg (125 mg Intravenous Given 04/24/20 1707)  magnesium sulfate IVPB 2 g 50 mL (2 g Intravenous New Bag/Given 04/24/20 1712)  albuterol (PROVENTIL) (2.5 MG/3ML) 0.083% nebulizer solution 5 mg (5 mg Nebulization Given 04/24/20 1807)  ipratropium (ATROVENT) nebulizer solution 1 mg (1 mg Nebulization Given 04/24/20 1807)    ED Course  I have reviewed the triage vital signs and the nursing notes.  Pertinent labs & imaging results that were available during my care of the patient were reviewed by me and considered in my medical decision making (see chart for details).    MDM Rules/Calculators/A&P                          RENDELL THIVIERGE is a 74 y.o. male here present with a cough and wheezing.  I think likely COPD exacerbation versus pneumonia versus Covid versus CHF.  Patient is fully vaccinated for  Covid and had his booster shot as well.  Patient is not hypoxic currently.  Plan to get CBC, CMP, BNP, chest x-ray, Covid test.  He is already using albuterol with no relief so we will add steroids and magnesium for now  6:29 PM Patient's VBG showed mild alkalosis. His sodium level is 129. Patient's chest x-ray did not showed any pneumonia. Patient given nebs and steroids and magnesium and felt better. He has improved breath sounds now. Never hypoxic. We will try another course of steroid taper. Patient states that he did a 10-day taper previously and that worked well so we will prescribe another 10-day taper.  Final Clinical Impression(s) / ED Diagnoses Final diagnoses:  None    Rx / DC Orders ED Discharge Orders    None       Charlynne Pander, MD 04/24/20 [id number]

## 2020-04-24 NOTE — Discharge Instructions (Signed)
Take prednisone as prescribed   Continue duoneb every 4 hours. You can put two duonebs if you need to   Your sodium level is slightly low, recheck in a week with your doctor   Your COVID is negative today   See your pulmonologist   Return to ER if you have worse shortness of breath, cough, fever.

## 2020-04-24 NOTE — ED Triage Notes (Signed)
C/o SOB and cough  x 2 weeks

## 2020-04-24 NOTE — ED Notes (Signed)
Ambulated from lobby to room 3 on r/a, SpO2 98% HR 88-95, RR 28-30, speaking in short phrases.

## 2020-04-24 NOTE — Telephone Encounter (Signed)
Anselm Lis daughter would like to know if patient needs to go to Emergency Department. States called Emergency Department and they are full. Robin phone number is [phone].

## 2020-04-24 NOTE — ED Notes (Signed)
Pt on monitor and vitals cycling 

## 2020-04-24 NOTE — Telephone Encounter (Signed)
lmtcb for pt. None of the [facility] or [facility] have openings tomorrow.

## 2020-04-24 NOTE — ED Notes (Signed)
Patient denies pain and is resting comfortably.  

## 2020-04-24 NOTE — Telephone Encounter (Signed)
He has home O2 mainly for sleep but should stay on 2-3 liters continuously now.  He needs to be seen by somebody who can do CXR, check heart rhythm, check O2, basic labs and do Covid test.  Is there an Urgent Care he can get to?  If nothing else, can he  be seen here tomorrow? An APP would be great.

## 2020-04-24 NOTE — Telephone Encounter (Signed)
Called and spoke to pt. Pt states he is on the doxycycline and will finish in a couple days but states he feels he is still not feel well. Pt c/o non prod cough, increase in SOB, chest tightness, wheezing and states he feels mucus in his throat but cannot cough it up. Pt denies f/c/s. Per pt's chart, pt was prescribed zpak and pred on 12/13, then called back on 12/17 as he still wasn't feeling well. Pt was prescribed doxy on 12/17 and advised to go to ED if no improvement or worsening s/s. Pt was coughing and audibly dyspneic on the phone. Advised pt to go to ED. Pt verbalized understanding.   Will forward to Dr. Maple Hudson as Lorain Childes.

## 2020-04-25 NOTE — Telephone Encounter (Signed)
lmtcb for pt and advised him to go to UC.

## 2020-04-26 ENCOUNTER — Ambulatory Visit: Payer: Medicare Other | Admitting: Internal Medicine

## 2020-04-30 NOTE — Telephone Encounter (Signed)
Pt seen in ED on 12.21.2021.   Will forward to Dr. Maple Hudson as Lorain Childes.

## 2020-05-03 IMAGING — DX DG CHEST 2V
2 series · 2 of 2 positions shown · non-contrast
Comparison: 05/20/2018

CLINICAL DATA: COPD mixed type.  Hypertension.

EXAM:
CHEST - 2 VIEW

[chest pa]
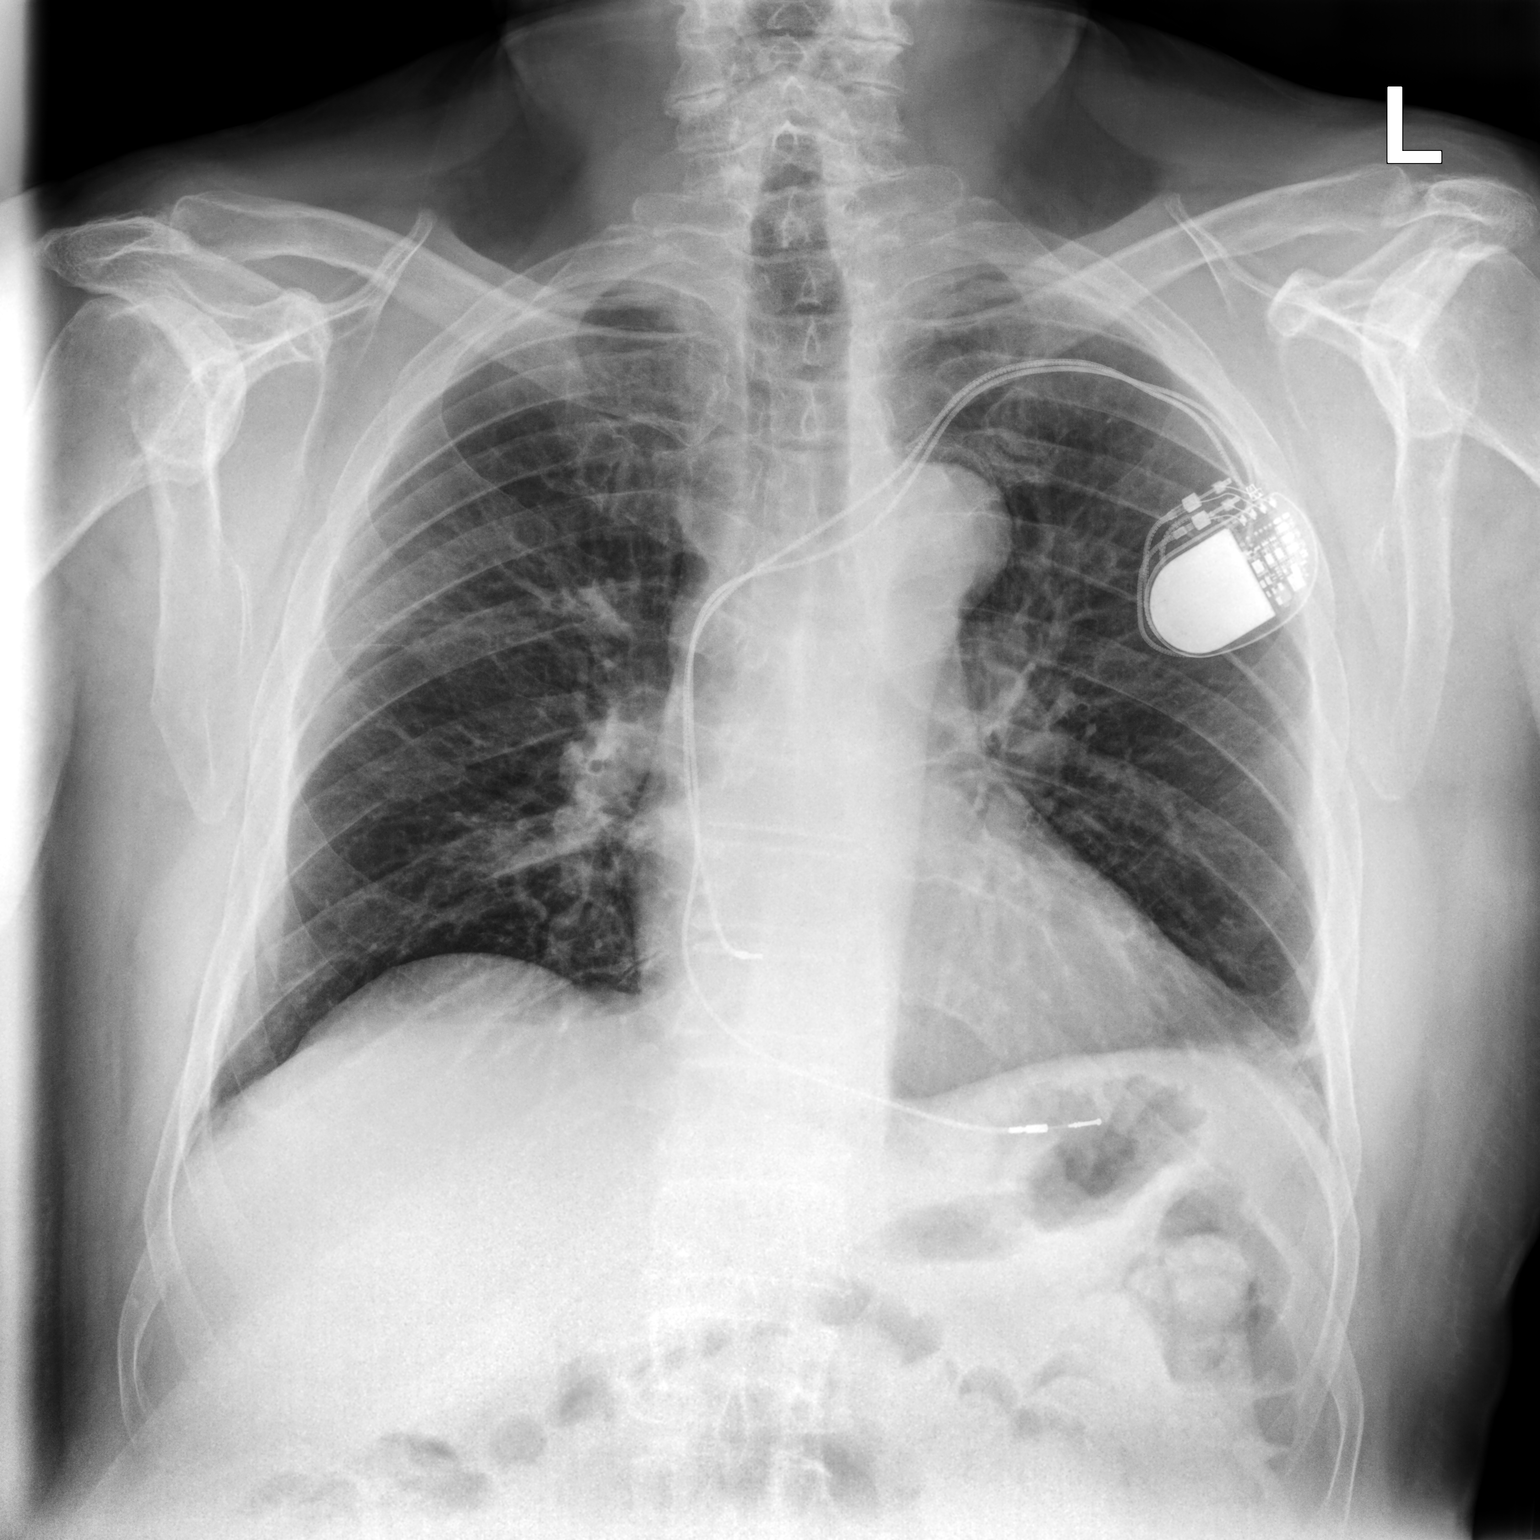

[chest lat]
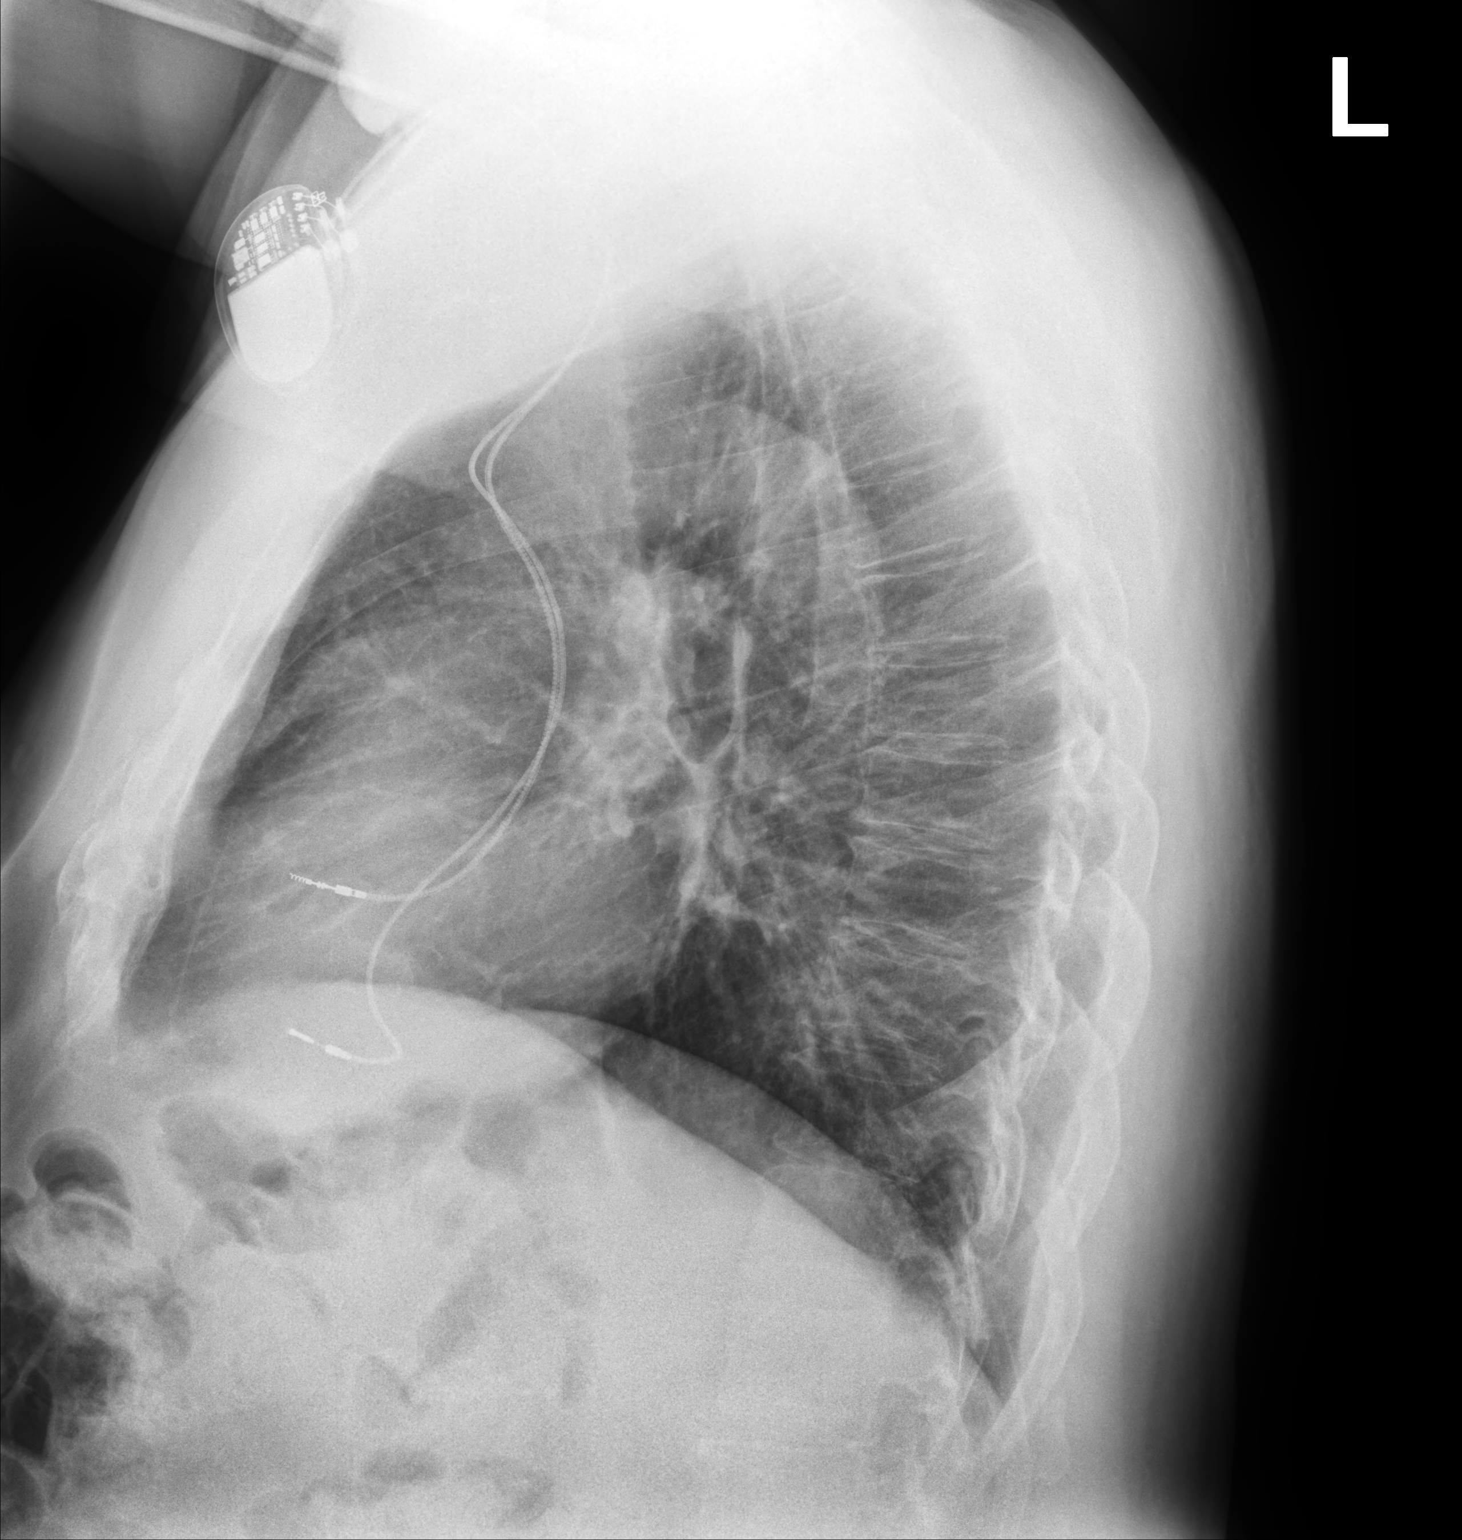

[2 of 2 positions shown; findings below may reference images not displayed]

FINDINGS: The heart size and mediastinal contours are within normal limits.
Dual lead pacemaker remains in appropriate position. Both lungs are
clear. The visualized skeletal structures are unremarkable.
IMPRESSION: No active cardiopulmonary disease.

## 2020-05-04 ENCOUNTER — Telehealth: Payer: Self-pay | Admitting: Cardiology

## 2020-05-04 NOTE — Telephone Encounter (Signed)
The patient's family called today saying that he had exertional chest pain after doing a little work in the yard and walking uphill.  He had to take nitroglycerin twice.  He is currently pain-free.  I suggested they might want to take him to the emergency room for further evaluation but they wanted to hold off on this if possible.  I reviewed his coronary CT scan from October 2020.  At that time he had nonobstructive coronary disease.  I will see if I can arrange for an office visit with an APP on Monday at [facility].  They know to take him to the emergency room at [facility] if he has more chest pain that requires nitroglycerin.  Corine Shelter PA-C 05/04/2020 2:01 PM

## 2020-05-07 ENCOUNTER — Telehealth: Payer: Self-pay | Admitting: Internal Medicine

## 2020-05-07 MED ORDER — HYDROCODONE-HOMATROPINE 5-1.5 MG/5ML PO SYRP: 5.0000 mL | ORAL_SOLUTION | Freq: Four times a day (QID) | ORAL | 0 refills | Status: DC | PRN

## 2020-05-07 NOTE — Telephone Encounter (Signed)
Spoke with pt's daughter, [name]  She states that pt continues to have cough "bronchitis"- non prod for approx 2-3 wks now   She states that he went to ED on 04/24/20 and they gave him pred and sent him home  He is now having some CP off and on over the past few days- exertional and had to take nitro x 2  She states pt relates the CP to his cough  Pt already finished pred taper yesterday and is wondering if needs more or needs ov  Pt not having any increased SOB, wheezing, f/c/s and tested neg for covid 04/24/20  Please advise, thanks!

## 2020-05-07 NOTE — Telephone Encounter (Signed)
Chest pain from cough would be like sore ribs, and wouldn't respond to nitroglycerin- I would be concerned that might be angina and if its worse than usual he needs to contact his PCP or his heart doctor.  If the problem is mainly dry cough, we can send some codeine cough syrup to see if that helps, at least till the roads are safer to drive on. If he wants me to send that please let me know.

## 2020-05-07 NOTE — Telephone Encounter (Signed)
Spoke with the pt's daughter and made aware of response from Dr Maple Hudson  She states they already made appt with his cardiologist for 05/08/20  Pt is wanting something to suppress the cough but is allergic to codeine- causes itching  Please advise if another cough syrup without codeine can be sent Please advise thanks!  Allergies  Allergen Reactions  . Penicillins Anaphylaxis    Has patient had a PCN reaction causing immediate rash, facial/tongue/throat swelling, SOB or lightheadedness with hypotension: Yes Has patient had a PCN reaction causing severe rash involving mucus membranes or skin necrosis: No Has patient had a PCN reaction that required hospitalization: Yes Has patient had a PCN reaction occurring within the last 10 years: No If all of the above answers are "NO", then may proceed with Cephalosporin use.   [name] Lopressor [Metoprolol Tartrate]     " It drops my heart rate."  . Codeine Rash

## 2020-05-07 NOTE — Telephone Encounter (Signed)
Some people allergic to codeine can tolerate either tramadol pills or hydrocodone. Does he know either of these?

## 2020-05-07 NOTE — Telephone Encounter (Signed)
Spoke with patients daughter who states the patient was having some exertional chest pain on Saturday Pt was evaluated at ED for Chest pain and COPD  Pt has bronchitis and is coughing excessively - pts daughter already contacted Dr. Maple Hudson (pulmonology)  [name] is concerned because her father hasn't been evaluated by cardiology in almost 2 years Pt doesn't have established general cardiologist, he has only been followed by Dr. Graciela Husbands for Pacemaker Pt is scheduled with Dr. Izora Ribas on 05/08/20 at 2:40 for symptoms and to establish gen cards care [name] advised that if patients symptoms worsen they need to go to the ED [name] is aware and agreeable of appointment and plan

## 2020-05-07 NOTE — Telephone Encounter (Signed)
Spoke with the pt's daughter, [name] and notified of response per Dr Maple Hudson and she verbalized understanding.

## 2020-05-07 NOTE — Telephone Encounter (Signed)
Spoke with [name] and she states she does not think pt has tried either of these- tramadol and hydrocodone, but is willing to see if either would help with cough. Pt uses [facility] [place]. Thanks!

## 2020-05-07 NOTE — Telephone Encounter (Signed)
Per Franky Macho Kilroy's phone note from 12/31 patient's daughter [name] calling in to see if she can get him in for an appointment today. Earliest in office opening with any APP was 05/17/20. Scheduled patient for the 13th - [name] is concerned as Franky Macho suggested that he be seen today. Please advise  Thank you !

## 2020-05-07 NOTE — Telephone Encounter (Signed)
Script sent to try hydrocodone cough syrup. Can take an antihistamine like claritin or zyrtec with this if needed for mild itching.

## 2020-05-08 ENCOUNTER — Other Ambulatory Visit: Payer: Self-pay

## 2020-05-08 ENCOUNTER — Ambulatory Visit: Payer: Medicare Other | Admitting: Internal Medicine

## 2020-05-08 ENCOUNTER — Ambulatory Visit: Payer: Medicare Other | Admitting: Pulmonary Disease

## 2020-05-08 ENCOUNTER — Encounter: Payer: Self-pay | Admitting: Internal Medicine

## 2020-05-08 VITALS — BP 134/70 | HR 67 | Ht 72.0 in | Wt 239.0 lb

## 2020-05-08 DIAGNOSIS — I700 Atherosclerosis of aorta: Secondary | ICD-10-CM

## 2020-05-08 DIAGNOSIS — J449 Chronic obstructive pulmonary disease, unspecified: Secondary | ICD-10-CM | POA: Diagnosis not present

## 2020-05-08 DIAGNOSIS — I25118 Atherosclerotic heart disease of native coronary artery with other forms of angina pectoris: Secondary | ICD-10-CM | POA: Diagnosis not present

## 2020-05-08 DIAGNOSIS — E119 Type 2 diabetes mellitus without complications: Secondary | ICD-10-CM | POA: Diagnosis not present

## 2020-05-08 DIAGNOSIS — I10 Essential (primary) hypertension: Secondary | ICD-10-CM | POA: Insufficient documentation

## 2020-05-08 DIAGNOSIS — E782 Mixed hyperlipidemia: Secondary | ICD-10-CM

## 2020-05-08 MED ORDER — FUROSEMIDE 20 MG PO TABS: 20.0000 mg | ORAL_TABLET | Freq: Every day | ORAL | 3 refills | Status: DC

## 2020-05-08 MED ORDER — ISOSORBIDE MONONITRATE ER 30 MG PO TB24: 30.0000 mg | ORAL_TABLET | Freq: Every day | ORAL | 3 refills | Status: DC

## 2020-05-08 NOTE — Patient Instructions (Addendum)
Medication Instructions:  Your physician has recommended you make the following change in your medication:  1-START  Isosorbide Mononitrate  ( Imdur )30 mg by mouth daily 2-START furosemide (lasix) 20 mg by mouth daily.  *If you need a refill on your cardiac medications before your next appointment, please call your pharmacy*   Lab Work: Your physician recommends that you return for lab work in: 1 week for BMET and Mg  If you have labs (blood work) drawn today and your tests are completely normal, you will receive your results only by: [name] MyChart Message (if you have MyChart) OR . A paper copy in the mail If you have any lab test that is abnormal or we need to change your treatment, we will call you to review the results.   Testing/Procedures: Your physician has requested that you have an echocardiogram. Echocardiography is a painless test that uses sound waves to create images of your heart. It provides your doctor with information about the size and shape of your heart and how well your heart's chambers and valves are working. This procedure takes approximately one hour. There are no restrictions for this procedure.   Follow-Up: At [facility], you and your health needs are our priority.  As part of our continuing mission to provide you with exceptional heart care, we have created designated Provider Care Teams.  These Care Teams include your primary Cardiologist (physician) and Advanced Practice Providers (APPs -  Physician Assistants and Nurse Practitioners) who all work together to provide you with the care you need, when you need it.  We recommend signing up for the patient portal called "MyChart".  Sign up information is provided on this After Visit Summary.  MyChart is used to connect with patients for Virtual Visits (Telemedicine).  Patients are able to view lab/test results, encounter notes, upcoming appointments, etc.  Non-urgent messages can be sent to your provider as well.    To learn more about what you can do with MyChart, go to [email or web address].    Your next appointment:   1 to 2 month(s)  The format for your next appointment:   In Person  Provider:   You may see Dr. Izora Ribas or one of the following Advanced Practice Providers on your designated Care Team:    Ronie Spies, PA-C  Jacolyn Reedy, PA-C

## 2020-05-08 NOTE — Progress Notes (Signed)
Cardiology Office Note:    Date:  05/08/2020   ID:  [name] [name], DOB April 06, 1946, MRN [id number]  PCP:  [name], MD  [facility] Cardiologist:  No primary care provider on file.  [facility] Electrophysiologist:  Sherryl Manges, MD   CC:  Cest pain; establish care with gen cards  History of Present Illness:    [name] [name] is a 75 y.o. male with a hx of DM with HTN, HLD, Non obstructive CAD, COPD, SSS s/p Medtronic PPM, AT seen by Dr. Graciela Husbands, CKD. Who presents for evaluation.  Seem by NP Jeraldine Loots 01/2019 and received CCTA for CP.  Patient notes that he is doing well; is an active person.  Since /last visit developed bronchitis with multiple antibiotics and prednisone, and has had persistent cough.  Saturday had to walk up an incline dealing with a tree in his year.  When comeing back up from the house; had significant chest pain, relieved with 2 nitroglycerins and rest.  Had shower the next day and had less severe chest pain not needing nitroglycerin. Gets the chest pain once or twice a month with exertion. No shortness of breath.  PND/Orthopnea.  No weight gain but notes new leg swelling.  No palpitations or syncope .  Ambulatory blood pressure not done.  Daughter provider history.   Past Medical History:  Diagnosis Date  . Arthritis   . Bradycardia    pacemaker - Medtronic Adapta #[id number] - December 22, 2005  . Carpal tunnel syndrome on both sides   . Carpal tunnel syndrome, bilateral   . Chronic back pain   . Chronic kidney disease   . COPD (chronic obstructive pulmonary disease) (HCC)   . Depression   . Diabetes mellitus    type II controled with diet  . GERD (gastroesophageal reflux disease)   . Headache(784.0)   . Hyperlipidemia   . Hypertension   . Insomnia   . Neuromuscular disorder (HCC)    peripheral neuropathy BLE  . Pneumonia   . Presence of permanent cardiac pacemaker   . Wears glasses     Past Surgical History:  Procedure Laterality Date  . BACK  SURGERY    . CARPAL TUNNEL RELEASE Right 03/24/2017   Procedure: Right CARPAL TUNNEL RELEASE;  Surgeon: Maeola Harman, MD;  Location: [facility] OR;  Service: Neurosurgery;  Laterality: Right;  Right CARPAL TUNNEL RELEASE  . EP IMPLANTABLE DEVICE N/A 06/27/2015   Procedure:  PPM Generator Changeout;  Surgeon: Duke Salvia, MD;  Location: [facility] INVASIVE CV LAB;  Service: Cardiovascular;  Laterality: N/A;  . HEMORRHOID SURGERY    . INSERT / REPLACE / REMOVE PACEMAKER    . MULTIPLE TOOTH EXTRACTIONS    . PACEMAKER INSERTION  ?2007  . TOTAL HIP ARTHROPLASTY Right 01/25/2013   Procedure: TOTAL HIP ARTHROPLASTY ANTERIOR APPROACH;  Surgeon: Velna Ochs, MD;  Location: [facility] OR;  Service: Orthopedics;  Laterality: Right;  . ULNAR NERVE TRANSPOSITION Right 03/24/2017   Procedure: Right Ulnar nerve release;  Surgeon: Maeola Harman, MD;  Location: [facility] OR;  Service: Neurosurgery;  Laterality: Right;  Right Ulnar nerve release    Current Medications: Current Meds  Medication Sig  . furosemide (LASIX) 20 MG tablet Take 1 tablet (20 mg total) by mouth daily.  . isosorbide mononitrate (IMDUR) 30 MG 24 hr tablet Take 1 tablet (30 mg total) by mouth daily.  . predniSONE (DELTASONE) 10 MG tablet Take 10 mg by mouth every other day.     Allergies:  Penicillins, Lopressor [metoprolol tartrate], and Codeine   Social History   Socioeconomic History  . Marital status: Legally Separated    Spouse name: Not on file  . Number of children: 2  . Years of education: `  . Highest education level: Not on file  Occupational History  . Occupation: Retired  Tobacco Use  . Smoking status: Former Smoker    Packs/day: 2.00    Years: 15.00    Pack years: 30.00    Types: Cigarettes    Start date: 05/05/1958    Quit date: 05/05/1973    Years since quitting: 47.0  . Smokeless tobacco: Never Used  Vaping Use  . Vaping Use: Never used  Substance and Sexual Activity  . Alcohol use: No    Alcohol/week: 0.0 standard drinks  .  Drug use: No  . Sexual activity: Not on file  Other Topics Concern  . Not on file  Social History Narrative  . Not on file   Social Determinants of Health   Financial Resource Strain: Not on file  Food Insecurity: Not on file  Transportation Needs: Not on file  Physical Activity: Not on file  Stress: Not on file  Social Connections: Not on file     Family History: The patient's family history includes Allergies in an other family member; Asthma in his maternal aunt; Heart disease in his mother.  ROS:   Please see the history of present illness.    All other systems reviewed and are negative.  EKGs/Labs/Other Studies Reviewed:    The following studies were reviewed today:  EKG:  05/08/20: Possible sinus rhythm (low amplitude P wave) 1st HB, iRBBB 04/24/20: SR with 1st HB rate 77 with PACs and PVCs  Transthoracic Echocardiogram: Date:08/29/2014 Results: Study Conclusions  - Left ventricle: The cavity size was normal. There was mild focal  basal hypertrophy of the septum. Systolic function was normal.  The estimated ejection fraction was in the range of 55% to 60%.  Wall motion was normal; there were no regional wall motion  abnormalities. Doppler parameters are consistent with abnormal  left ventricular relaxation (grade 1 diastolic dysfunction).  - Left atrium: The atrium was mildly dilated.  - Pulmonary arteries: Systolic pressure was mildly increased. PA  peak pressure: 34 mm Hg (S).   Cardiac CT: Date: 03/01/2019 Results: Aortic Atherosclerosis, Left Dominant System, LM CAC Left main is a large artery that gives rise to LAD and LCX arteries. Minimal (<25%) calcified plaque.  LAD is a large vessel. There is minimal (<25%) mixed plaque in the ostium and mild (25-49%) mixed plaque proximally. There is a short segment of myocardial bridging distally. There is a large, branching D1 with minimal calcified plaque proximally and mild calcified plaque at the  bifurcation. D2 and D3 are small and without significant stenosis.  LCX is a large, dominant artery that gives rise to one large OM1 branch. There is scattered minimal calcified plaque. There is a small OM1, medium OM2, large OM3 and small OM4 without evidence of significant disease.  NM Stress Testing: Date: 01/25/2013 Results: Normal Stress per report   Recent Labs: 04/24/2020: ALT 20; B Natriuretic Peptide 43.6; BUN 21; Creatinine, Ser 1.18; Hemoglobin 13.6; Platelets 276; Potassium 4.9; Sodium 129  Recent Lipid Panel No results found for: CHOL, TRIG, HDL, CHOLHDL, VLDL, LDLCALC, LDLDIRECT   Risk Assessment/Calculations:     N/A  Physical Exam:    VS:  BP 134/70   Pulse 67   Ht 6' (1.829 m)  Wt 239 lb (108.4 kg)   SpO2 95%   BMI 32.41 kg/m     Wt Readings from Last 3 Encounters:  05/08/20 239 lb (108.4 kg)  04/24/20 225 lb (102.1 kg)  11/28/19 (!) 221 lb 9.6 oz (100.5 kg)     GEN:  Well nourished, well developed in no acute distress HEENT: Normal NECK: No JVD; No carotid bruits LYMPHATICS: No lymphadenopathy CARDIAC: RRR, no murmurs, rubs, gallops RESPIRATORY:  Clear to auscultation without rales, wheezing or rhonchi  ABDOMEN: Soft, non-tender, non-distended MUSCULOSKELETAL:  1+ edema bilaterally; No deformity  SKIN: Warm and dry NEUROLOGIC:  Alert and oriented x 3 PSYCHIATRIC:  Normal affect   ASSESSMENT:    1. Coronary artery disease of native artery of native heart with stable angina pectoris (HCC)   2. Mixed hyperlipidemia   3. Aortic atherosclerosis (HCC)   4. COPD with chronic bronchitis (HCC)   5. Diabetes mellitus with coincident hypertension (HCC)    PLAN:    Coronary Artery Disease; Nonobstructive CKD HLD Aortic Atherosclerosis COPD - symptomatic  - anatomy: minimal non obstructive disease in 01/2019 - continue ASA 81 mg; - continue statin, goal LDL < 70 - continue diltiazem (BB held in the setting of COPD) - Start Imdur 30 mg Po  Daily - low threshold to cath if unrelenting angina - will get echocardiogram - will start lasix 20 mg Po Daily; with a BMP and Mg in 7-10 days  Diabetes with hypertension - ambulatory blood pressure not done, will start ambulatory BP monitoring; gave education on how to perform ambulatory blood pressure monitoring including the frequency and technique; goal ambulatory blood pressure < 135/85 on average - continue home medications with the exception of the above - discussed diet (DASH/low sodium), and exercise/weight loss interventions    1-2 month  follow up unless new symptoms or abnormal test results warranting change in plan   Medication Adjustments/Labs and Tests Ordered: Current medicines are reviewed at length with the patient today.  Concerns regarding medicines are outlined above.  Orders Placed This Encounter  Procedures  . Magnesium  . Basic metabolic panel  . EKG 12-Lead  . ECHOCARDIOGRAM COMPLETE   Meds ordered this encounter  Medications  . isosorbide mononitrate (IMDUR) 30 MG 24 hr tablet    Sig: Take 1 tablet (30 mg total) by mouth daily.    Dispense:  90 tablet    Refill:  3  . furosemide (LASIX) 20 MG tablet    Sig: Take 1 tablet (20 mg total) by mouth daily.    Dispense:  90 tablet    Refill:  3    Patient Instructions  Medication Instructions:  Your physician has recommended you make the following change in your medication:  1-START  Isosorbide Mononitrate  ( Imdur )30 mg by mouth daily 2-START furosemide (lasix) 20 mg by mouth daily.  *If you need a refill on your cardiac medications before your next appointment, please call your pharmacy*   Lab Work: Your physician recommends that you return for lab work in: 1 week for BMET and Mg  If you have labs (blood work) drawn today and your tests are completely normal, you will receive your results only by: [name] MyChart Message (if you have MyChart) OR . A paper copy in the mail If you have any lab test  that is abnormal or we need to change your treatment, we will call you to review the results.   Testing/Procedures: Your physician has requested that you have  an echocardiogram. Echocardiography is a painless test that uses sound waves to create images of your heart. It provides your doctor with information about the size and shape of your heart and how well your heart's chambers and valves are working. This procedure takes approximately one hour. There are no restrictions for this procedure.   Follow-Up: At [facility], you and your health needs are our priority.  As part of our continuing mission to provide you with exceptional heart care, we have created designated Provider Care Teams.  These Care Teams include your primary Cardiologist (physician) and Advanced Practice Providers (APPs -  Physician Assistants and Nurse Practitioners) who all work together to provide you with the care you need, when you need it.  We recommend signing up for the patient portal called "MyChart".  Sign up information is provided on this After Visit Summary.  MyChart is used to connect with patients for Virtual Visits (Telemedicine).  Patients are able to view lab/test results, encounter notes, upcoming appointments, etc.  Non-urgent messages can be sent to your provider as well.   To learn more about what you can do with MyChart, go to [email or web address].    Your next appointment:   1 to 2 month(s)  The format for your next appointment:   In Person  Provider:   You may see Dr. Gasper Sells or one of the following Advanced Practice Providers on your designated Care Team:    Melina Copa, PA-C  Ermalinda Barrios, PA-C       Signed, Werner Lean, MD  05/08/2020 3:34 PM    [place]

## 2020-05-09 ENCOUNTER — Telehealth: Payer: Self-pay | Admitting: Internal Medicine

## 2020-05-09 NOTE — Telephone Encounter (Signed)
Spoke with daughter [name] (dpr on file), states that pt is not feeling any better since calling us on 1/3 and being prescribed hydrocodone and zyrtec.   Pt is also drinking hot tea with honey and lemon, using a humidifier.  Pt saw cardiology yesterday and had an ekg, scheduled for labs and an echo.  Pt is adamant that his chest discomfort is coming from his lungs and not his heart.  Pt and daughter are requesting an ov with a cxr with CY so "someone can actually listen to him".    No open spots on CY's schedule this week, but there are a few RNA slots. CY please advise if ok to schedule.  Thanks.

## 2020-05-09 NOTE — Telephone Encounter (Signed)
Ok to use an RNA slot with me.

## 2020-05-09 NOTE — Telephone Encounter (Signed)
Spoke with pt's daughter, scheduled pt on RNA slot on Friday at 11:30.  Nothing further needed at this time- will close encounter.

## 2020-05-10 NOTE — Progress Notes (Signed)
HPI  M former smoker with COPD, chronic cough, OSA complicated by Cardiac dysrhythmia/ pacemaker, HBP  Office Spirometry 08/11/14-moderate obstruction/restriction-FVC 2.95/62%, FEV1 2.37/65%, ratio 0.79, FEF 25-75% 2.43/77% PFT 10/26/2017-normal flows without response to bronchodilator, moderate diffusion defect. Walk Test O2 Qualifying-04/26/2018-qualified for portable oxygen-during his second lap heart rate reached 112 and saturation fell to 86% on room air Labs 04/26/2018- IgE 464, EOS wnl HST 05/17/18>> Severe obstructive sleep apnea AHI:33 CPAP to BIPAP titration 02/18/2019-  BIPAP 10/6, if not tolerated try CPAP 7  -----------------------------------------------------------------------------------------    11/28/19- 75 year old male former smoker followed for COPD, restrictive lung disease, cough, OSA, complicated by cardiac arrhythmia/Pacemaker/ ICD, HBP, DM CPAP BIPAP 10/6/ [facility] -----O2 2L sleep and prn/ [employer]- dc'd- wasn't using and had it picked up Download compliance 90%, AHI 2.7/ hr Trelegy 100, Proair hfa, prednisone 10 mg QOD, Singulair, neb Duoneb,  -----sob with walking, especially with incline. Body weight today 221 lbs Had 2 Moderna Covax Stable exercise tolerance with DOE on hills or hurrying. He is improved and/ or more used to this now.  Litle cough or wheeze and no acute vents.  CXR 05/30/19-  The heart size and mediastinal contours are within normal limits. Dual lead pacemaker remains in appropriate position. Both lungs are clear. The visualized skeletal structures are unremarkable. IMPRESSION: No active cardiopulmonary disease.  05/11/20- 75 year old male former smoker followed for COPD, restrictive lung disease, cough, OSA, complicated by CAD, cardiac arrhythmia/Pacemaker/ ICD, HBP, DM CPAP BIPAP 10/6/ [employer] 100, Proair hfa, prednisone 10 mg QOD, Singulair, neb Duoneb,  Download- compliance 63%, AHI 1.2/ hr Body weight today-  232 lbs Covid vax-3 Moderna Flu vax-had Recent exacerbation> Zpak, prednisone, then doxycycline, then ED 12/21->>solumedrol, nebs, pred taper. Neg for Covid and Flu 12/21. Cardiology assessed 1/4 for exertional chest pain> Imdur, lasix -----Patient has been coughing since Christmas, patient states that it is a dry cough. Worse at night. Patient was started on Lasix and Imdur on Tuesday. Daughter [name] here. They say lasix, prednisone and Hycodan each helped "some". They relate this to his experience of 2 years ago when sustained cough only gradually responded to prednisone.  Continues acid blockers without obvious refux/ heartburn. Cough is worse supine.  CXR 1V 04/24/20-  IMPRESSION: 1. No acute intrathoracic process.  ROS-see HPI   + = positive Constitutional:    weight loss, night sweats, fevers, chills, fatigue, lassitude. HEENT:    headaches, difficulty swallowing, tooth/dental problems, sore throat,       sneezing, itching, ear ache, +nasal congestion, +post nasal drip, snoring CV:    +chest pain, orthopnea, PND, swelling in lower extremities, anasarca,                                                         dizziness, palpitations Resp:   +shortness of breath with exertion or at rest.                productive cough,   + non-productive cough, coughing up of blood.              change in color of mucus.  +wheezing.   Skin:    rash or lesions. GI:  No-   heartburn, indigestion, abdominal pain, nausea, vomiting, GU:  MS:   joint pain, stiffness,  Neuro-  nothing unusual Psych:  change in mood or affect.  depression or anxiety.   memory loss.  OBJ- Physical Exam General- Alert, Oriented, Affect-appropriate, Distress- none acute, + overweight Skin- rash-none, lesions- none, excoriation- none Lymphadenopathy- none Head- atraumatic            Eyes- Gross vision intact, PERRLA, conjunctivae and secretions clear            Ears- Hearing, canals-normal            Nose- Clear,  no-Septal dev, mucus, polyps, erosion, perforation             Throat- Mallampati II , mucosa clear , drainage-none, tonsils- atrophic, edentulous+, not hoarse Neck- flexible , trachea midline, no stridor , thyroid nl, carotid no bruit Chest - symmetrical excursion , unlabored           Heart/CV- RRR , no murmur , no gallop  , no rub, nl s1 s2                           - JVD- none , edema- none, stasis changes- none, varices- none           Lung- , wheeze+slight, cough+, dullness-none, rub- none,            Chest wall- +L pacemaker Abd-  Br/ Gen/ Rectal- Not done, not indicated Extrem- cyanosis- none, clubbing, none, atrophy- none, strength- nl, +cane Neuro- grossly intact to observation

## 2020-05-11 ENCOUNTER — Encounter: Payer: Self-pay | Admitting: Internal Medicine

## 2020-05-11 ENCOUNTER — Ambulatory Visit: Payer: Medicare Other | Admitting: Internal Medicine

## 2020-05-11 ENCOUNTER — Other Ambulatory Visit: Payer: Self-pay

## 2020-05-11 DIAGNOSIS — J449 Chronic obstructive pulmonary disease, unspecified: Secondary | ICD-10-CM

## 2020-05-11 MED ORDER — PREDNISONE 10 MG PO TABS: ORAL_TABLET | ORAL | 0 refills | Status: DC

## 2020-05-11 MED ORDER — HYDROCOD POLST-CPM POLST ER 10-8 MG/5ML PO SUER: ORAL | 0 refills | Status: DC

## 2020-05-11 NOTE — Patient Instructions (Signed)
Script sent for extended prednisone taper  Script sent for tusionex cough syrup  Use your breathing medicines as before.  Please call as needed

## 2020-05-11 NOTE — Assessment & Plan Note (Signed)
Subacute exacerbation, refractory Plan- long prednisone taper, Tusionex CS. With discussion.

## 2020-05-17 ENCOUNTER — Other Ambulatory Visit: Payer: Medicare Other

## 2020-05-17 ENCOUNTER — Ambulatory Visit: Payer: Medicare Other | Admitting: Physician Assistant

## 2020-05-24 ENCOUNTER — Telehealth: Payer: Medicare Other | Admitting: Internal Medicine

## 2020-05-24 ENCOUNTER — Other Ambulatory Visit: Payer: Self-pay | Admitting: Internal Medicine

## 2020-05-24 ENCOUNTER — Other Ambulatory Visit: Payer: Medicare Other | Admitting: *Deleted

## 2020-05-24 ENCOUNTER — Other Ambulatory Visit: Payer: Self-pay

## 2020-05-24 DIAGNOSIS — J449 Chronic obstructive pulmonary disease, unspecified: Secondary | ICD-10-CM | POA: Diagnosis not present

## 2020-05-24 DIAGNOSIS — E782 Mixed hyperlipidemia: Secondary | ICD-10-CM | POA: Diagnosis not present

## 2020-05-24 DIAGNOSIS — J4489 Other specified chronic obstructive pulmonary disease: Secondary | ICD-10-CM

## 2020-05-24 DIAGNOSIS — I700 Atherosclerosis of aorta: Secondary | ICD-10-CM

## 2020-05-24 DIAGNOSIS — E119 Type 2 diabetes mellitus without complications: Secondary | ICD-10-CM | POA: Diagnosis not present

## 2020-05-24 DIAGNOSIS — I25118 Atherosclerotic heart disease of native coronary artery with other forms of angina pectoris: Secondary | ICD-10-CM | POA: Diagnosis not present

## 2020-05-24 LAB — BASIC METABOLIC PANEL
BUN/Creatinine Ratio: 21 (ref 10–24)
BUN: 26 mg/dL (ref 8–27)
CO2: 26 mmol/L (ref 20–29)
Calcium: 9.2 mg/dL (ref 8.6–10.2)
Chloride: 88 mmol/L — ABNORMAL LOW (ref 96–106)
Creatinine, Ser: 1.24 mg/dL (ref 0.76–1.27)
GFR calc Af Amer: 65 mL/min/{1.73_m2} (ref >59)
GFR calc non Af Amer: 56 mL/min/{1.73_m2} — ABNORMAL LOW (ref >59)
Glucose: 412 mg/dL — ABNORMAL HIGH (ref 65–99)
Potassium: 4.5 mmol/L (ref 3.5–5.2)
Sodium: 129 mmol/L — ABNORMAL LOW (ref 134–144)

## 2020-05-24 LAB — MAGNESIUM: Magnesium: 2.1 mg/dL (ref 1.6–2.3)

## 2020-05-24 MED ORDER — DILTIAZEM HCL ER 120 MG PO CP12: 120.0000 mg | ORAL_CAPSULE | Freq: Every day | ORAL | 0 refills | Status: DC

## 2020-05-28 ENCOUNTER — Ambulatory Visit (HOSPITAL_COMMUNITY): Payer: Medicare Other | Attending: Cardiology

## 2020-05-28 ENCOUNTER — Telehealth: Payer: Self-pay

## 2020-05-28 ENCOUNTER — Other Ambulatory Visit: Payer: Self-pay

## 2020-05-28 DIAGNOSIS — I25118 Atherosclerotic heart disease of native coronary artery with other forms of angina pectoris: Secondary | ICD-10-CM | POA: Diagnosis not present

## 2020-05-28 DIAGNOSIS — E119 Type 2 diabetes mellitus without complications: Secondary | ICD-10-CM | POA: Diagnosis not present

## 2020-05-28 DIAGNOSIS — J449 Chronic obstructive pulmonary disease, unspecified: Secondary | ICD-10-CM | POA: Insufficient documentation

## 2020-05-28 DIAGNOSIS — I700 Atherosclerosis of aorta: Secondary | ICD-10-CM | POA: Diagnosis not present

## 2020-05-28 DIAGNOSIS — E782 Mixed hyperlipidemia: Secondary | ICD-10-CM | POA: Insufficient documentation

## 2020-05-28 DIAGNOSIS — I10 Essential (primary) hypertension: Secondary | ICD-10-CM

## 2020-05-28 LAB — ECHOCARDIOGRAM COMPLETE
Area-P 1/2: 2.13 cm2
S' Lateral: 2.9 cm

## 2020-05-28 NOTE — Telephone Encounter (Signed)
-----   Message from Mahesh A Chandrasekhar, MD sent at 05/28/2020  1:56 PM EST ----- Results: No SAM (presuming a supine study) AAI 2.0 cm/m2 (low risk for TA rupture) Plan: No change in plan  Mahesh A Chandrasekhar, MD  

## 2020-05-28 NOTE — Telephone Encounter (Signed)
The patient has been notified of the result and verbalized understanding.  All questions (if any) were answered. Leanord Hawking, RN 05/28/2020 2:55 PM

## 2020-05-28 NOTE — Telephone Encounter (Signed)
-----   Message from Christell Constant, MD sent at 05/28/2020  1:56 PM EST ----- Results: No SAM (presuming a supine study) AAI 2.0 cm/m2 (low risk for TA rupture) Plan: No change in plan  Christell Constant, MD

## 2020-05-28 NOTE — Telephone Encounter (Signed)
Left patient a message to return call to office regarding recent results.  

## 2020-06-05 DIAGNOSIS — J449 Chronic obstructive pulmonary disease, unspecified: Secondary | ICD-10-CM | POA: Diagnosis not present

## 2020-06-05 DIAGNOSIS — E7800 Pure hypercholesterolemia, unspecified: Secondary | ICD-10-CM | POA: Diagnosis not present

## 2020-06-05 DIAGNOSIS — I739 Peripheral vascular disease, unspecified: Secondary | ICD-10-CM | POA: Diagnosis not present

## 2020-06-05 DIAGNOSIS — N183 Chronic kidney disease, stage 3 unspecified: Secondary | ICD-10-CM | POA: Diagnosis not present

## 2020-06-05 DIAGNOSIS — Z950 Presence of cardiac pacemaker: Secondary | ICD-10-CM | POA: Diagnosis not present

## 2020-06-05 DIAGNOSIS — K219 Gastro-esophageal reflux disease without esophagitis: Secondary | ICD-10-CM | POA: Diagnosis not present

## 2020-06-05 DIAGNOSIS — E11319 Type 2 diabetes mellitus with unspecified diabetic retinopathy without macular edema: Secondary | ICD-10-CM | POA: Diagnosis not present

## 2020-06-05 DIAGNOSIS — E1142 Type 2 diabetes mellitus with diabetic polyneuropathy: Secondary | ICD-10-CM | POA: Diagnosis not present

## 2020-06-05 DIAGNOSIS — I10 Essential (primary) hypertension: Secondary | ICD-10-CM | POA: Diagnosis not present

## 2020-06-08 ENCOUNTER — Other Ambulatory Visit: Payer: Self-pay

## 2020-06-08 ENCOUNTER — Ambulatory Visit: Payer: Medicare Other | Admitting: Internal Medicine

## 2020-06-08 VITALS — BP 120/60 | HR 83 | Ht 72.0 in | Wt 230.0 lb

## 2020-06-08 DIAGNOSIS — Z950 Presence of cardiac pacemaker: Secondary | ICD-10-CM | POA: Diagnosis not present

## 2020-06-08 DIAGNOSIS — I495 Sick sinus syndrome: Secondary | ICD-10-CM

## 2020-06-08 NOTE — Progress Notes (Signed)
Patient Care Team: [name], MD as PCP - General (Family Medicine) Duke Salvia, MD as PCP - Electrophysiology (Cardiology)   HPI:  [name] [name] is a 75 y.o. male Seen in followup for pacemaker implanted for chronotropic incompetence and sinus node dysfunction by Dr. Erie Noe.  He underwent gen replacement 2/17        DATE TEST EF   9/14 MYOVIEW    % No ischemia  4/16 Echo  55-60 %   10/21 CTA  CaScore 117 No obstruc disease  1/22 Echo  60-65% Ao Asc 43 mm        .  The patient denies palpitations, lightheadedness or syncope.  He has chronic and episodically worsening dyspnea on exertion in the context of his COPD-steroid-dependent as well as presumed HFpEF.  He was recently seen by Dr. Ander Slade who underwent diuresis of about 15 pounds to good effect.  He has been treated with a duplication of diuretics and has struggled with cramping with daily furosemide.  He saw Dr. Maple Hudson recently for his COPD and has just come off a 28-day steroid taper.  Recurrent problems with chest discomfort.  It typically follows the shortness of breath.  It radiates some durations are seconds to minutes.  It abates with exercise.  He takes nitroglycerin for it.  He has undergone evaluation as above specifically the CTA demonstrating no obstructive disease.    He awakens with chest discomfort associate with a brackish taste in his mouth.  He takes PPIs as well as H2 blockers.  Followed by his PCP  Past Medical History:  Diagnosis Date  . Arthritis   . Bradycardia    pacemaker - Medtronic Adapta #[id number] - December 22, 2005  . Carpal tunnel syndrome on both sides   . Carpal tunnel syndrome, bilateral   . Chronic back pain   . Chronic kidney disease   . COPD (chronic obstructive pulmonary disease) (HCC)   . Depression   . Diabetes mellitus    type II controled with diet  . GERD (gastroesophageal reflux disease)   . Headache(784.0)   . Hyperlipidemia   . Hypertension   . Insomnia   .  Neuromuscular disorder (HCC)    peripheral neuropathy BLE  . Pneumonia   . Presence of permanent cardiac pacemaker   . Wears glasses     Past Surgical History:  Procedure Laterality Date  . BACK SURGERY    . CARPAL TUNNEL RELEASE Right 03/24/2017   Procedure: Right CARPAL TUNNEL RELEASE;  Surgeon: Maeola Harman, MD;  Location: [facility] OR;  Service: Neurosurgery;  Laterality: Right;  Right CARPAL TUNNEL RELEASE  . EP IMPLANTABLE DEVICE N/A 06/27/2015   Procedure:  PPM Generator Changeout;  Surgeon: Duke Salvia, MD;  Location: [facility] INVASIVE CV LAB;  Service: Cardiovascular;  Laterality: N/A;  . HEMORRHOID SURGERY    . INSERT / REPLACE / REMOVE PACEMAKER    . MULTIPLE TOOTH EXTRACTIONS    . PACEMAKER INSERTION  ?2007  . TOTAL HIP ARTHROPLASTY Right 01/25/2013   Procedure: TOTAL HIP ARTHROPLASTY ANTERIOR APPROACH;  Surgeon: Velna Ochs, MD;  Location: [facility] OR;  Service: Orthopedics;  Laterality: Right;  . ULNAR NERVE TRANSPOSITION Right 03/24/2017   Procedure: Right Ulnar nerve release;  Surgeon: Maeola Harman, MD;  Location: [facility] OR;  Service: Neurosurgery;  Laterality: Right;  Right Ulnar nerve release    Current Outpatient Medications  Medication Sig Dispense Refill  . acetaminophen (TYLENOL) 650 MG CR tablet Take  1,300 mg daily as needed by mouth for pain.     [name] aspirin 81 MG tablet Take 81 mg by mouth daily.    [name] atorvastatin (LIPITOR) 40 MG tablet Take 40 mg by mouth daily.    [name] CALCIUM-MAGNESIUM-ZINC PO Take 1 capsule by mouth daily.    [name] diltiazem (CARDIZEM SR) 120 MG 12 hr capsule Take 1 capsule (120 mg total) by mouth daily. Please keep upcoming appt in February 2022 with Dr. Graciela Husbands before anymore refills. Thank you 30 capsule 0  . docusate sodium (COLACE) 100 MG capsule Take 100 mg by mouth 2 (two) times daily.    . famotidine (PEPCID) 20 MG tablet TAKE ONE TABLET BY MOUTH ONCE DAILY AT BEDTIME 30 tablet 0  . furosemide (LASIX) 20 MG tablet Take 1 tablet (20 mg total) by mouth  daily. 90 tablet 3  . gabapentin (NEURONTIN) 300 MG capsule Take 300 mg by mouth 2 (two) times daily.    [name] glimepiride (AMARYL) 1 MG tablet Take 1 tablet by mouth daily.    [name] guaiFENesin (MUCINEX) 600 MG 12 hr tablet Take 1 tablet (600 mg total) by mouth 2 (two) times daily. 30 tablet 3  . hydrochlorothiazide (HYDRODIURIL) 12.5 MG tablet Take 12.5 mg daily by mouth.    [name] ipratropium-albuterol (DUONEB) 0.5-2.5 (3) MG/3ML SOLN Inhale 3 mLs into the lungs every 6 (six) hours as needed (wheezing or shortness of breath).     . isosorbide mononitrate (IMDUR) 30 MG 24 hr tablet Take 1 tablet (30 mg total) by mouth daily. 90 tablet 3  . ketorolac (ACULAR) 0.5 % ophthalmic solution Place 1 drop into the left eye 4 (four) times daily.    [name] levocetirizine (XYZAL) 5 MG tablet Take 5 mg at bedtime by mouth.     . montelukast (SINGULAIR) 10 MG tablet Take 10 mg at bedtime by mouth.     . Multiple Vitamin (MULTIVITAMIN WITH MINERALS) TABS tablet Take 1 tablet by mouth daily.    [name] olmesartan (BENICAR) 20 MG tablet Take 20 mg daily by mouth.    . pantoprazole (PROTONIX) 40 MG tablet TAKE ONE TABLET BY MOUTH ONCE DAILY TAKE  30-60  MINUTES  BEFORE  FIRST  MEAL  OF  THE  DAY 30 tablet 0  . predniSONE (DELTASONE) 10 MG tablet Take 10 mg by mouth every other day.    [name] PROAIR HFA 108 (90 BASE) MCG/ACT inhaler Inhale 2 puffs into the lungs 4 (four) times daily as needed.    [name] Respiratory Therapy Supplies (FLUTTER) DEVI Blow through 4 times per set, 3 sets per day when needed to clear lungs 1 each 0  . TRELEGY ELLIPTA 100-62.5-25 MCG/INH AEPB USE 1 INHALATION BY MOUTH  DAILY 180 each 3  . nitroGLYCERIN (NITROSTAT) 0.4 MG SL tablet Place 1 tablet (0.4 mg total) under the tongue every 5 (five) minutes as needed for chest pain. 25 tablet 6   No current facility-administered medications for this visit.    Allergies  Allergen Reactions  . Penicillins Anaphylaxis    Has patient had a PCN reaction causing immediate rash,  facial/tongue/throat swelling, SOB or lightheadedness with hypotension: Yes Has patient had a PCN reaction causing severe rash involving mucus membranes or skin necrosis: No Has patient had a PCN reaction that required hospitalization: Yes Has patient had a PCN reaction occurring within the last 10 years: No If all of the above answers are "NO", then may proceed with Cephalosporin use.   Ria Bush [  Metoprolol Tartrate]     " It drops my heart rate."  . Codeine Rash    Review of Systems negative except from HPI and PMH  Physical Exam BP 120/60   Pulse 83   Ht 6' (1.829 m)   Wt 230 lb (104.3 kg)   SpO2 93%   BMI 31.19 kg/m  Well developed and well nourished in no acute distress HENT normal Neck supple with JVP-6-7 cm Clear Device pocket well healed; without hematoma or erythema.  There is no tethering  Regular rate and rhythm, no  gallop No  murmur Abd-soft with active BS No Clubbing cyanosis tr edema Skin-warm and dry A & Oriented  Grossly normal sensory and motor function  ECG sinus at 83 Interval 22/11/37 Axis left -47 PVCs-frequent  Assessment and  Plan  Sinus node dysfunction   Pacemaker Medtronic   PVCs-frequent  Hypertension   Atrial tachycardia  Dyspnea   COPD  Presyncope   Chest pain-atypical The patient has recurring chest pain which is unlikely cardiac based on the recent CTA and is more likely related to reflux and/or his COPD/dyspnea.  Hence, we will have him take his Protonix at night I have asked him to follow-up with his PCP regarding treatment of his reflux which is likely aggravated in the context of his aspirin and his steroids.  His shortness of breath is multifactorial.  Is managed in part by the pulmonary team, he remains on chronic steroids.  He is today quite euvolemic.  We will discontinue his hydrochlorothiazide and put him on Lasix 20 mg daily.  This will hopefully obviate the cramping that has been problematic.  He has a  significant PVC burden over the last 14 months has been about 10%.  However, at this point there is no untoward effects on his LV function so we will continue to watch it and not treated.  Blood pressures well controlled.

## 2020-06-08 NOTE — Patient Instructions (Signed)
Medication Instructions:  Your physician has recommended you make the following change in your medication:   ** Stop taking Hydrochlorothiazide.  ** Begin taking Protonix at night.    *If you need a refill on your cardiac medications before your next appointment, please call your pharmacy*   Lab Work: None ordered.  If you have labs (blood work) drawn today and your tests are completely normal, you will receive your results only by: [name] MyChart Message (if you have MyChart) OR . A paper copy in the mail If you have any lab test that is abnormal or we need to change your treatment, we will call you to review the results.   Testing/Procedures: None ordered.    Follow-Up: At [facility], you and your health needs are our priority.  As part of our continuing mission to provide you with exceptional heart care, we have created designated Provider Care Teams.  These Care Teams include your primary Cardiologist (physician) and Advanced Practice Providers (APPs -  Physician Assistants and Nurse Practitioners) who all work together to provide you with the care you need, when you need it.  We recommend signing up for the patient portal called "MyChart".  Sign up information is provided on this After Visit Summary.  MyChart is used to connect with patients for Virtual Visits (Telemedicine).  Patients are able to view lab/test results, encounter notes, upcoming appointments, etc.  Non-urgent messages can be sent to your provider as well.   To learn more about what you can do with MyChart, go to [email or web address].    Your next appointment:   12 month(s)  The format for your next appointment:   In Person  Provider:   Dr Graciela Husbands

## 2020-06-21 ENCOUNTER — Other Ambulatory Visit: Payer: Self-pay | Admitting: Internal Medicine

## 2020-06-22 ENCOUNTER — Encounter: Payer: Self-pay | Admitting: Internal Medicine

## 2020-06-22 ENCOUNTER — Ambulatory Visit: Payer: Medicare Other | Admitting: Internal Medicine

## 2020-06-22 ENCOUNTER — Other Ambulatory Visit: Payer: Self-pay

## 2020-06-22 VITALS — BP 110/70 | HR 86 | Ht 72.0 in | Wt 233.0 lb

## 2020-06-22 DIAGNOSIS — J449 Chronic obstructive pulmonary disease, unspecified: Secondary | ICD-10-CM

## 2020-06-22 DIAGNOSIS — I5032 Chronic diastolic (congestive) heart failure: Secondary | ICD-10-CM | POA: Diagnosis not present

## 2020-06-22 NOTE — Progress Notes (Signed)
Cardiology Office Note:    Date:  06/22/2020   ID:  [name] [name], DOB Jan 31, 1946, MRN [id number]  PCP:  [name], MD  [facility] Cardiologist:  Riley Lam MD [facility] Electrophysiologist:  Sherryl Manges, MD   CC:  Cest pain; establish care with gen cards  History of Present Illness:    [name] [name] is a 75 y.o. male with a hx of DM with HTN, HLD, Non obstructive CAD, COPD, SSS s/p Medtronic PPM, AT seen by Dr. Graciela Husbands, CKD. Who presents for evaluation.  Seem by NP [name] [name] 01/2019 and received CCTA for CP. In interim of his visit 05/08/20, patient diuresed 15 lbs with improvement in his SOB.  Saw Dr. Graciela Husbands, but has leg swelling:  Stopped his thiazide diuretic.  Questions about volumes status, breathing, and CP lead to re-evaluation.  Patient notes that he is doing OK- has been taking PRN diuretics, Has been forgetting to do check his weights .  Since last visit notes 3 lbs weight gain changes.  Relevant interval testing or therapy include taking lasix every other day.  There are no interval hospital/ED visit.    No chest pain or pressure .  Notes DOE when walking up to see his daugther (significant incline) and no PND/Orthopnea. Notes some dietary indiscretions (last night ate BBQ ribs for example) No palpitations or syncope.   Past Medical History:  Diagnosis Date  . Arthritis   . Bradycardia    pacemaker - Medtronic Adapta #[id number] - December 22, 2005  . Carpal tunnel syndrome on both sides   . Carpal tunnel syndrome, bilateral   . Chronic back pain   . Chronic kidney disease   . COPD (chronic obstructive pulmonary disease) (HCC)   . Depression   . Diabetes mellitus    type II controled with diet  . GERD (gastroesophageal reflux disease)   . Headache(784.0)   . Hyperlipidemia   . Hypertension   . Insomnia   . Neuromuscular disorder (HCC)    peripheral neuropathy BLE  . Pneumonia   . Presence of permanent cardiac pacemaker   . Wears glasses      Past Surgical History:  Procedure Laterality Date  . BACK SURGERY    . CARPAL TUNNEL RELEASE Right 03/24/2017   Procedure: Right CARPAL TUNNEL RELEASE;  Surgeon: Maeola Harman, MD;  Location: [facility] OR;  Service: Neurosurgery;  Laterality: Right;  Right CARPAL TUNNEL RELEASE  . EP IMPLANTABLE DEVICE N/A 06/27/2015   Procedure:  PPM Generator Changeout;  Surgeon: Duke Salvia, MD;  Location: [facility] INVASIVE CV LAB;  Service: Cardiovascular;  Laterality: N/A;  . HEMORRHOID SURGERY    . INSERT / REPLACE / REMOVE PACEMAKER    . MULTIPLE TOOTH EXTRACTIONS    . PACEMAKER INSERTION  ?2007  . TOTAL HIP ARTHROPLASTY Right 01/25/2013   Procedure: TOTAL HIP ARTHROPLASTY ANTERIOR APPROACH;  Surgeon: Velna Ochs, MD;  Location: [facility] OR;  Service: Orthopedics;  Laterality: Right;  . ULNAR NERVE TRANSPOSITION Right 03/24/2017   Procedure: Right Ulnar nerve release;  Surgeon: Maeola Harman, MD;  Location: [facility] OR;  Service: Neurosurgery;  Laterality: Right;  Right Ulnar nerve release    Current Medications: Current Meds  Medication Sig  . acetaminophen (TYLENOL) 650 MG CR tablet Take 1,300 mg daily as needed by mouth for pain.   [name] aspirin 81 MG tablet Take 81 mg by mouth daily.  [name] atorvastatin (LIPITOR) 40 MG tablet Take 40 mg by mouth daily.  [name] CALCIUM-MAGNESIUM-ZINC PO  Take 1 capsule by mouth daily.  [name] diltiazem (CARDIZEM SR) 120 MG 12 hr capsule Take 1 capsule (120 mg total) by mouth daily.  [name] docusate sodium (COLACE) 100 MG capsule Take 100 mg by mouth 2 (two) times daily.  . famotidine (PEPCID) 20 MG tablet TAKE ONE TABLET BY MOUTH ONCE DAILY AT BEDTIME  . furosemide (LASIX) 20 MG tablet Take 1 tablet (20 mg total) by mouth daily.  [name] gabapentin (NEURONTIN) 300 MG capsule Take 300 mg by mouth 2 (two) times daily.  [name] glimepiride (AMARYL) 1 MG tablet Take 1 tablet by mouth daily.  [name] guaiFENesin (MUCINEX) 600 MG 12 hr tablet Take 1 tablet (600 mg total) by mouth 2 (two) times daily.  [name]  ipratropium-albuterol (DUONEB) 0.5-2.5 (3) MG/3ML SOLN Inhale 3 mLs into the lungs every 6 (six) hours as needed (wheezing or shortness of breath).   . isosorbide mononitrate (IMDUR) 30 MG 24 hr tablet Take 1 tablet (30 mg total) by mouth daily.  [name] ketorolac (ACULAR) 0.5 % ophthalmic solution Place 1 drop into the left eye 4 (four) times daily.  [name] levocetirizine (XYZAL) 5 MG tablet Take 5 mg at bedtime by mouth.   . montelukast (SINGULAIR) 10 MG tablet Take 10 mg at bedtime by mouth.   . Multiple Vitamin (MULTIVITAMIN WITH MINERALS) TABS tablet Take 1 tablet by mouth daily.  . nitroGLYCERIN (NITROSTAT) 0.4 MG SL tablet Place 1 tablet (0.4 mg total) under the tongue every 5 (five) minutes as needed for chest pain.  [name] olmesartan (BENICAR) 20 MG tablet Take 20 mg daily by mouth.  . pantoprazole (PROTONIX) 40 MG tablet TAKE ONE TABLET BY MOUTH ONCE DAILY TAKE  30-60  MINUTES  BEFORE  FIRST  MEAL  OF  THE  DAY  . predniSONE (DELTASONE) 10 MG tablet Take 10 mg by mouth every other day.  [name] PROAIR HFA 108 (90 BASE) MCG/ACT inhaler Inhale 2 puffs into the lungs 4 (four) times daily as needed.  [name] Respiratory Therapy Supplies (FLUTTER) DEVI Blow through 4 times per set, 3 sets per day when needed to clear lungs  . TRELEGY ELLIPTA 100-62.5-25 MCG/INH AEPB USE 1 INHALATION BY MOUTH  DAILY     Allergies:   Penicillins, Lopressor [metoprolol tartrate], and Codeine   Social History   Socioeconomic History  . Marital status: Legally Separated    Spouse name: Not on file  . Number of children: 2  . Years of education: `  . Highest education level: Not on file  Occupational History  . Occupation: Retired  Tobacco Use  . Smoking status: Former Smoker    Packs/day: 2.00    Years: 15.00    Pack years: 30.00    Types: Cigarettes    Start date: 05/05/1958    Quit date: 05/05/1973    Years since quitting: 47.1  . Smokeless tobacco: Never Used  Vaping Use  . Vaping Use: Never used  Substance and Sexual  Activity  . Alcohol use: No    Alcohol/week: 0.0 standard drinks  . Drug use: No  . Sexual activity: Not on file  Other Topics Concern  . Not on file  Social History Narrative  . Not on file   Social Determinants of Health   Financial Resource Strain: Not on file  Food Insecurity: Not on file  Transportation Needs: Not on file  Physical Activity: Not on file  Stress: Not on file  Social Connections: Not on file     Family History: The  patient's family history includes Allergies in an other family member; Asthma in his maternal aunt; Heart disease in his mother.  ROS:   Please see the history of present illness.    All other systems reviewed and are negative.  EKGs/Labs/Other Studies Reviewed:    The following studies were reviewed today:  EKG:  05/08/20: Possible sinus rhythm (low amplitude P wave) 1st HB, iRBBB 04/24/20: SR with 1st HB rate 77 with PACs and PVCs  Transthoracic Echocardiogram: Date:08/29/2014 Results: Study Conclusions  - Left ventricle: The cavity size was normal. There was mild focal  basal hypertrophy of the septum. Systolic function was normal.  The estimated ejection fraction was in the range of 55% to 60%.  Wall motion was normal; there were no regional wall motion  abnormalities. Doppler parameters are consistent with abnormal  left ventricular relaxation (grade 1 diastolic dysfunction).  - Left atrium: The atrium was mildly dilated.  - Pulmonary arteries: Systolic pressure was mildly increased. PA  peak pressure: 34 mm Hg (S).   Cardiac CT: Date: 03/01/2019 Results: Aortic Atherosclerosis, Left Dominant System, LM CAC Left main is a large artery that gives rise to LAD and LCX arteries. Minimal (<25%) calcified plaque.  LAD is a large vessel. There is minimal (<25%) mixed plaque in the ostium and mild (25-49%) mixed plaque proximally. There is a short segment of myocardial bridging distally. There is a large, branching D1  with minimal calcified plaque proximally and mild calcified plaque at the bifurcation. D2 and D3 are small and without significant stenosis.  LCX is a large, dominant artery that gives rise to one large OM1 branch. There is scattered minimal calcified plaque. There is a small OM1, medium OM2, large OM3 and small OM4 without evidence of significant disease.  NM Stress Testing: Date: 01/25/2013 Results: Normal Stress per report   Recent Labs: 04/24/2020: ALT 20; B Natriuretic Peptide 43.6; Hemoglobin 13.6; Platelets 276 05/24/2020: BUN 26; Creatinine, Ser 1.24; Magnesium 2.1; Potassium 4.5; Sodium 129  Recent Lipid Panel No results found for: CHOL, TRIG, HDL, CHOLHDL, VLDL, LDLCALC, LDLDIRECT   Risk Assessment/Calculations:     N/A  Physical Exam:    VS:  BP 110/70 (BP Location: Left Arm, Patient Position: Sitting, Cuff Size: Normal)   Pulse 86   Ht 6' (1.829 m)   Wt 233 lb (105.7 kg)   SpO2 95%   BMI 31.60 kg/m     Wt Readings from Last 3 Encounters:  06/22/20 233 lb (105.7 kg)  06/08/20 230 lb (104.3 kg)  05/11/20 232 lb (105.2 kg)    GEN:  Well nourished, well developed in no acute distress HEENT: Normal NECK: No JVD; No carotid bruits LYMPHATICS: No lymphadenopathy CARDIAC: RRR, no murmurs, rubs, gallops RESPIRATORY:  Clear to auscultation without rales, wheezing or rhonchi  ABDOMEN: Soft, non-tender, non-distended, thought with slight dullness to percussiong MUSCULOSKELETAL:  1+ edema bilaterally; No deformity  SKIN: Warm without visible deformity NEUROLOGIC:  Alert and oriented x 3 PSYCHIATRIC:  Normal affect   ASSESSMENT:    1. Chronic heart failure with preserved ejection fraction (HCC)   2. COPD with chronic bronchitis (HCC)    PLAN:     Heart Failure Preserved Ejection Fraction  COPD- will keep Patient's pulmonologist in the loop Frequent PVCs- seen by Dr. Graciela HusbandsKlein DM with HTN - NYHA class II, Stage B, hypervolemic - Diuretic regimen: will restart  his lasix 20 mg PO Daily, check BNP/BMP/Mg in 7-10 days.  If worsening weight gain with lab call  back, low threshold to increase to 40 mg PO Daily (daughter had concerns about him having AKI) - re-discussed fluid, salt, and weigh restrictrions - could consider future SGLT2i  Coronary Artery Disease; Nonobstructive HLD Aortic Atherosclerosis - asymptomatic  - anatomy: minimal non obstructive disease in 01/2019 - continue ASA 81 mg; - continue statin, goal LDL < 70 ; in discussion with patient and daughter will defer increase in medication to goal at next visit - continue diltiazem (BB held in the setting of COPD) - Imdur 30 mg Po Daily may be helping per patient; will keep mediation   Diabetes with hypertension - ambulatory blood pressure not done, will again recommend ambulatory BP monitoring; gave education on how to perform ambulatory blood pressure monitoring including the frequency and technique; goal ambulatory blood pressure < 135/85 on average - continue home medications with the exception of the above  Three month follow up unless new symptoms or abnormal test results warranting change in plan  Would be reasonable for  APP Follow up  Medication Adjustments/Labs and Tests Ordered: Current medicines are reviewed at length with the patient today.  Concerns regarding medicines are outlined above.  Orders Placed This Encounter  Procedures  . Pro b natriuretic peptide (BNP)  . Basic metabolic panel  . Magnesium   No orders of the defined types were placed in this encounter.   Patient Instructions  Medication Instructions:  Your physician recommends that you continue on your current medications as directed. Please refer to the Current Medication list given to you today.  PLEASE TAKE:  Furosemide (Lasix) 20 mg by mouth daily *If you need a refill on your cardiac medications before your next appointment, please call your pharmacy*   Lab Work: 7-10 DAYS: BMP, BNP, Mg If you  have labs (blood work) drawn today and your tests are completely normal, you will receive your results only by: [name] MyChart Message (if you have MyChart) OR . A paper copy in the mail If you have any lab test that is abnormal or we need to change your treatment, we will call you to review the results.   Testing/Procedures: NONE   Follow-Up: At [facility], you and your health needs are our priority.  As part of our continuing mission to provide you with exceptional heart care, we have created designated Provider Care Teams.  These Care Teams include your primary Cardiologist (physician) and Advanced Practice Providers (APPs -  Physician Assistants and Nurse Practitioners) who all work together to provide you with the care you need, when you need it.     Your next appointment:   3 month(s)  The format for your next appointment:   In Person  Provider:   You may see Izora Ribas, MD or one of the following Advanced Practice Providers on your designated Care Team:    Ronie Spies, PA-C  Jacolyn Reedy, PA-C          Signed, Christell Constant, MD  06/22/2020 11:21 AM    [facility]

## 2020-06-22 NOTE — Patient Instructions (Signed)
Medication Instructions:  Your physician recommends that you continue on your current medications as directed. Please refer to the Current Medication list given to you today.  PLEASE TAKE:  Furosemide (Lasix) 20 mg by mouth daily *If you need a refill on your cardiac medications before your next appointment, please call your pharmacy*   Lab Work: 7-10 DAYS: BMP, BNP, Mg If you have labs (blood work) drawn today and your tests are completely normal, you will receive your results only by: [name] MyChart Message (if you have MyChart) OR . A paper copy in the mail If you have any lab test that is abnormal or we need to change your treatment, we will call you to review the results.   Testing/Procedures: NONE   Follow-Up: At [facility], you and your health needs are our priority.  As part of our continuing mission to provide you with exceptional heart care, we have created designated Provider Care Teams.  These Care Teams include your primary Cardiologist (physician) and Advanced Practice Providers (APPs -  Physician Assistants and Nurse Practitioners) who all work together to provide you with the care you need, when you need it.     Your next appointment:   3 month(s)  The format for your next appointment:   In Person  Provider:   You may see Izora Ribas, MD or one of the following Advanced Practice Providers on your designated Care Team:    Ronie Spies, PA-C  Jacolyn Reedy, PA-C

## 2020-06-29 ENCOUNTER — Other Ambulatory Visit: Payer: Medicare Other | Admitting: *Deleted

## 2020-06-29 ENCOUNTER — Other Ambulatory Visit: Payer: Self-pay

## 2020-06-29 DIAGNOSIS — I5032 Chronic diastolic (congestive) heart failure: Secondary | ICD-10-CM

## 2020-06-30 LAB — BASIC METABOLIC PANEL
BUN/Creatinine Ratio: 15 (ref 10–24)
BUN: 18 mg/dL (ref 8–27)
CO2: 24 mmol/L (ref 20–29)
Calcium: 8.9 mg/dL (ref 8.6–10.2)
Chloride: 100 mmol/L (ref 96–106)
Creatinine, Ser: 1.17 mg/dL (ref 0.76–1.27)
GFR calc Af Amer: 70 mL/min/{1.73_m2} (ref >59)
GFR calc non Af Amer: 60 mL/min/{1.73_m2} (ref >59)
Glucose: 292 mg/dL — ABNORMAL HIGH (ref 65–99)
Potassium: 3.7 mmol/L (ref 3.5–5.2)
Sodium: 140 mmol/L (ref 134–144)

## 2020-06-30 LAB — PRO B NATRIURETIC PEPTIDE: NT-Pro BNP: 153 pg/mL (ref 0–486)

## 2020-06-30 LAB — MAGNESIUM: Magnesium: 1.9 mg/dL (ref 1.6–2.3)

## 2020-07-03 ENCOUNTER — Telehealth: Payer: Self-pay | Admitting: Internal Medicine

## 2020-07-03 NOTE — Telephone Encounter (Signed)
Patient is trying to send a remote transmission but states his machine is not working. Please advise.

## 2020-07-04 NOTE — Telephone Encounter (Signed)
The pt tried sending a transmission and received the error code 102. [employer] is going to call the patient tomorrow to help him trouble shoot the monitor. The patient was not with the monitor at this time.

## 2020-07-04 NOTE — Telephone Encounter (Signed)
LMOVM for pt to return my call. I left the device clinic direct number.

## 2020-07-10 ENCOUNTER — Ambulatory Visit (INDEPENDENT_AMBULATORY_CARE_PROVIDER_SITE_OTHER): Payer: Medicare Other

## 2020-07-10 DIAGNOSIS — I471 Supraventricular tachycardia: Secondary | ICD-10-CM

## 2020-07-10 NOTE — Telephone Encounter (Signed)
The patient called back and he states he [employer] never called back. He was not with the monitor for me to call and get him help. I gave him the number to tech support for him to call at his convenience.

## 2020-07-10 NOTE — Telephone Encounter (Signed)
Transmission received.

## 2020-07-10 NOTE — Telephone Encounter (Signed)
I called the patient to follow up to see if he spoke with [employer] about his monitor or not. LMOVM for patient to return call.

## 2020-07-11 LAB — CUP PACEART REMOTE DEVICE CHECK
Battery Impedance: 304 Ohm
Battery Remaining Longevity: 103 mo
Battery Voltage: 2.77 V
Brady Statistic AP VP Percent: 2 %
Brady Statistic AP VS Percent: 85 %
Brady Statistic AS VP Percent: 0 %
Brady Statistic AS VS Percent: 12 %
Date Time Interrogation Session: 20220308104312
Implantable Lead Implant Date: 20070820
Implantable Lead Implant Date: 20070820
Implantable Lead Location: 753859
Implantable Lead Location: 753860
Implantable Lead Model: 4092
Implantable Lead Model: 5076
Implantable Pulse Generator Implant Date: 20170222
Lead Channel Impedance Value: 448 Ohm
Lead Channel Impedance Value: 588 Ohm
Lead Channel Pacing Threshold Amplitude: 0.875 V
Lead Channel Pacing Threshold Amplitude: 1.125 V
Lead Channel Pacing Threshold Pulse Width: 0.4 ms
Lead Channel Pacing Threshold Pulse Width: 0.4 ms
Lead Channel Setting Pacing Amplitude: 2 V
Lead Channel Setting Pacing Amplitude: 2.5 V
Lead Channel Setting Pacing Pulse Width: 0.4 ms
Lead Channel Setting Sensing Sensitivity: 1.4 mV

## 2020-07-18 NOTE — Progress Notes (Signed)
Remote pacemaker transmission.   

## 2020-08-21 DIAGNOSIS — K219 Gastro-esophageal reflux disease without esophagitis: Secondary | ICD-10-CM | POA: Diagnosis not present

## 2020-08-21 DIAGNOSIS — E1142 Type 2 diabetes mellitus with diabetic polyneuropathy: Secondary | ICD-10-CM | POA: Diagnosis not present

## 2020-08-21 DIAGNOSIS — N183 Chronic kidney disease, stage 3 unspecified: Secondary | ICD-10-CM | POA: Diagnosis not present

## 2020-08-21 DIAGNOSIS — E11319 Type 2 diabetes mellitus with unspecified diabetic retinopathy without macular edema: Secondary | ICD-10-CM | POA: Diagnosis not present

## 2020-08-21 DIAGNOSIS — J441 Chronic obstructive pulmonary disease with (acute) exacerbation: Secondary | ICD-10-CM | POA: Diagnosis not present

## 2020-08-21 DIAGNOSIS — J449 Chronic obstructive pulmonary disease, unspecified: Secondary | ICD-10-CM | POA: Diagnosis not present

## 2020-08-21 DIAGNOSIS — J411 Mucopurulent chronic bronchitis: Secondary | ICD-10-CM | POA: Diagnosis not present

## 2020-08-21 DIAGNOSIS — E7800 Pure hypercholesterolemia, unspecified: Secondary | ICD-10-CM | POA: Diagnosis not present

## 2020-08-21 DIAGNOSIS — I10 Essential (primary) hypertension: Secondary | ICD-10-CM | POA: Diagnosis not present

## 2020-08-21 DIAGNOSIS — M1611 Unilateral primary osteoarthritis, right hip: Secondary | ICD-10-CM | POA: Diagnosis not present

## 2020-09-27 DIAGNOSIS — H02422 Myogenic ptosis of left eyelid: Secondary | ICD-10-CM | POA: Diagnosis not present

## 2020-09-27 DIAGNOSIS — H02831 Dermatochalasis of right upper eyelid: Secondary | ICD-10-CM | POA: Diagnosis not present

## 2020-09-27 DIAGNOSIS — H02421 Myogenic ptosis of right eyelid: Secondary | ICD-10-CM | POA: Diagnosis not present

## 2020-09-27 DIAGNOSIS — H02412 Mechanical ptosis of left eyelid: Secondary | ICD-10-CM | POA: Diagnosis not present

## 2020-09-27 DIAGNOSIS — H02413 Mechanical ptosis of bilateral eyelids: Secondary | ICD-10-CM | POA: Diagnosis not present

## 2020-09-27 DIAGNOSIS — H57813 Brow ptosis, bilateral: Secondary | ICD-10-CM | POA: Diagnosis not present

## 2020-09-27 DIAGNOSIS — H0279 Other degenerative disorders of eyelid and periocular area: Secondary | ICD-10-CM | POA: Diagnosis not present

## 2020-09-27 DIAGNOSIS — H02423 Myogenic ptosis of bilateral eyelids: Secondary | ICD-10-CM | POA: Diagnosis not present

## 2020-09-27 DIAGNOSIS — H02411 Mechanical ptosis of right eyelid: Secondary | ICD-10-CM | POA: Diagnosis not present

## 2020-09-27 DIAGNOSIS — H53483 Generalized contraction of visual field, bilateral: Secondary | ICD-10-CM | POA: Diagnosis not present

## 2020-09-27 DIAGNOSIS — H02834 Dermatochalasis of left upper eyelid: Secondary | ICD-10-CM | POA: Diagnosis not present

## 2020-10-08 DIAGNOSIS — I10 Essential (primary) hypertension: Secondary | ICD-10-CM | POA: Diagnosis not present

## 2020-10-08 DIAGNOSIS — K219 Gastro-esophageal reflux disease without esophagitis: Secondary | ICD-10-CM | POA: Diagnosis not present

## 2020-10-08 DIAGNOSIS — E11319 Type 2 diabetes mellitus with unspecified diabetic retinopathy without macular edema: Secondary | ICD-10-CM | POA: Diagnosis not present

## 2020-10-08 DIAGNOSIS — N183 Chronic kidney disease, stage 3 unspecified: Secondary | ICD-10-CM | POA: Diagnosis not present

## 2020-10-08 DIAGNOSIS — E1142 Type 2 diabetes mellitus with diabetic polyneuropathy: Secondary | ICD-10-CM | POA: Diagnosis not present

## 2020-10-08 DIAGNOSIS — E7800 Pure hypercholesterolemia, unspecified: Secondary | ICD-10-CM | POA: Diagnosis not present

## 2020-10-08 DIAGNOSIS — J441 Chronic obstructive pulmonary disease with (acute) exacerbation: Secondary | ICD-10-CM | POA: Diagnosis not present

## 2020-10-08 DIAGNOSIS — M1611 Unilateral primary osteoarthritis, right hip: Secondary | ICD-10-CM | POA: Diagnosis not present

## 2020-10-08 DIAGNOSIS — J449 Chronic obstructive pulmonary disease, unspecified: Secondary | ICD-10-CM | POA: Diagnosis not present

## 2020-10-08 DIAGNOSIS — J411 Mucopurulent chronic bronchitis: Secondary | ICD-10-CM | POA: Diagnosis not present

## 2020-10-09 ENCOUNTER — Ambulatory Visit (INDEPENDENT_AMBULATORY_CARE_PROVIDER_SITE_OTHER): Payer: Medicare Other

## 2020-10-09 DIAGNOSIS — I471 Supraventricular tachycardia: Secondary | ICD-10-CM

## 2020-10-10 LAB — CUP PACEART REMOTE DEVICE CHECK
Battery Impedance: 353 Ohm
Battery Remaining Longevity: 97 mo
Battery Voltage: 2.78 V
Brady Statistic AP VP Percent: 2 %
Brady Statistic AP VS Percent: 90 %
Brady Statistic AS VP Percent: 0 %
Brady Statistic AS VS Percent: 8 %
Date Time Interrogation Session: 20220607162856
Implantable Lead Implant Date: 20070820
Implantable Lead Implant Date: 20070820
Implantable Lead Location: 753859
Implantable Lead Location: 753860
Implantable Lead Model: 4092
Implantable Lead Model: 5076
Implantable Pulse Generator Implant Date: 20170222
Lead Channel Impedance Value: 436 Ohm
Lead Channel Impedance Value: 601 Ohm
Lead Channel Pacing Threshold Amplitude: 0.875 V
Lead Channel Pacing Threshold Amplitude: 0.875 V
Lead Channel Pacing Threshold Pulse Width: 0.4 ms
Lead Channel Pacing Threshold Pulse Width: 0.4 ms
Lead Channel Setting Pacing Amplitude: 2 V
Lead Channel Setting Pacing Amplitude: 2.5 V
Lead Channel Setting Pacing Pulse Width: 0.4 ms
Lead Channel Setting Sensing Sensitivity: 1.4 mV

## 2020-10-19 ENCOUNTER — Ambulatory Visit: Payer: Medicare Other | Admitting: Internal Medicine

## 2020-10-22 DIAGNOSIS — H53481 Generalized contraction of visual field, right eye: Secondary | ICD-10-CM | POA: Diagnosis not present

## 2020-10-22 DIAGNOSIS — H53482 Generalized contraction of visual field, left eye: Secondary | ICD-10-CM | POA: Diagnosis not present

## 2020-10-22 DIAGNOSIS — H53483 Generalized contraction of visual field, bilateral: Secondary | ICD-10-CM | POA: Diagnosis not present

## 2020-10-31 ENCOUNTER — Other Ambulatory Visit: Payer: Self-pay | Admitting: Internal Medicine

## 2020-10-31 NOTE — Progress Notes (Signed)
Remote pacemaker transmission.   

## 2020-11-01 ENCOUNTER — Other Ambulatory Visit: Payer: Self-pay | Admitting: Internal Medicine

## 2020-11-14 ENCOUNTER — Telehealth: Payer: Self-pay | Admitting: *Deleted

## 2020-11-14 NOTE — Telephone Encounter (Signed)
   [facility] Pre-operative Risk Assessment    Patient Name: EBRIMA RANTA  DOB: [birth year] MRN: [id number]  HEARTCARE STAFF:  - IMPORTANT!!!!!! Under Visit Info/Reason for Call, type in Other and utilize the format Clearance MM/DD/YY or Clearance TBD. Do not use dashes or single digits. - Please review there is not already an duplicate clearance open for this procedure. - If request is for dental extraction, please clarify the # of teeth to be extracted. - If the patient is currently at the dentist's office, call Pre-Op Callback Staff (MA/nurse) to input urgent request.  - If the patient is not currently in the dentist office, please route to the Pre-Op pool.  Request for surgical clearance:  What type of surgery is being performed? B/L UPPER EYELID PTOSIS REPAIR, B/L DIRECT w/B/L UPPER EYELID BLEPHAROPLASTY  When is this surgery scheduled? 11/28/20  What type of clearance is required (medical clearance vs. Pharmacy clearance to hold med vs. Both)? MEDICAL  Are there any medications that need to be held prior to surgery and how long?  ASA   Practice name and name of physician performing surgery? [facility]; DR. Leonard Schwartz   What is the office phone number? [phone]   7.   What is the office fax number? [phone]  8.   Anesthesia type (None, local, MAC, general) ? MAC   Julaine Hua 11/14/2020, 10:30 AM  _________________________________________________________________   (provider comments below)

## 2020-11-14 NOTE — Telephone Encounter (Signed)
[name] [name] is retuning [name]'s call. Please advise.

## 2020-11-14 NOTE — Telephone Encounter (Signed)
Home phone [phone]  Cell [phone] - corrected in chart   Left VM on both numbers

## 2020-11-15 ENCOUNTER — Other Ambulatory Visit: Payer: Self-pay | Admitting: *Deleted

## 2020-11-15 MED ORDER — NITROGLYCERIN 0.4 MG SL SUBL: 0.4000 mg | SUBLINGUAL_TABLET | SUBLINGUAL | 6 refills | Status: DC | PRN

## 2020-11-15 NOTE — Telephone Encounter (Signed)
   Name:  [name] [name]  DOB:  1945/12/21  MRN:  [id number]   Primary Cardiologist: Christell Constant, MD  Chart reviewed as part of pre-operative protocol coverage. Patient was contacted 11/15/2020 in reference to pre-operative risk assessment for pending surgery as outlined below.  [name] [name] was last seen on 06/22/20 by Dr. Izora Ribas.  Since that day, [name] [name] has continued to have intermittent chest pain. He reports episodes of chest pain once every few weeks when doing strenuous activity which is relieved with 1 SL nitro. He has not had escalating symptoms or nitro needs in recent months. He also has chronic DOE which is unchanged. Overall is able to complete 4 METs without anginal complaints.   He has a history of non-obstructive CAD with mild-moderate LAD disease and <25% LM disease, otherwise minimal scattered disease on a coronary CTA 02/2019.   Dr. Izora Ribas - given stable intermittent chest pain would you recommend any further work-up prior to his upcoming eyelid procedure. He has an upcoming visit with you 11/22/20 if you'd prefer an in office preop assessment. Please route your response back to P CV DIV PREOP. Thanks!   Beatriz Stallion, PA-C 11/15/2020, 3:37 PM

## 2020-11-15 NOTE — Telephone Encounter (Signed)
    Patient Name: [name] [name]  DOB: 04-27-1946 MRN: [id number]  Primary Cardiologist: Christell Constant, MD  Chart reviewed as part of pre-operative protocol coverage. Per Dr. Izora Ribas, [name] [name] would be at acceptable risk for the planned procedure without further cardiovascular testing.   The patient was advised that if he develops new symptoms prior to surgery to contact our office to arrange for a follow-up visit, and he verbalized understanding.  Okay to hold aspirin 7 days prior to his upcoming procedure if needed with plans to restart when cleared to do so by his surgeon.  I will route this recommendation to the requesting party via Epic fax function and remove from pre-op pool.  Please call with questions.  Beatriz Stallion, PA-C 11/15/2020, 4:51 PM

## 2020-11-15 NOTE — Telephone Encounter (Signed)
   Name: [name] [name]  DOB: Oct 09, 1945  MRN: [id number]   Primary Cardiologist: Christell Constant, MD  Chart reviewed as part of pre-operative protocol coverage.   Left a voicemail on home and cell numbers.   Beatriz Stallion, PA-C 11/15/2020, 10:04 AM

## 2020-11-21 NOTE — Progress Notes (Signed)
Cardiology Office Note:    Date:  11/22/2020   ID:  KINCADE GRANBERG, DOB 12/11/1945, MRN [id number]  PCP:  [name], MD  [facility] Cardiologist:  Riley Lam MD [facility] Electrophysiologist:  Sherryl Manges, MD   CC:  Chest pain   History of Present Illness:    [name] [name] is a 75 y.o. male with a hx of DM with HTN, HLD, Non obstructive CAD, COPD, SSS s/p Medtronic PPM, AT seen by Dr. Graciela Husbands, CKD. Who presents for evaluation.  Seem by NP Jeraldine Loots 01/2019 and received CCTA for CP. In interim of his visit 05/08/20, patient diuresed 15 lbs with improvement in his SOB.  Saw Dr. Graciela Husbands, but has leg swelling:  Stopped his thiazide diuretic.  Questions about volumes status, breathing, and CP lead to re-evaluation. Last seen 06/22/20.  In interim of this visit, patient plans for surgery.  11/22/20.  Eye surgery is next Wednesday 11/28/20.  Patient notes that he is doing pretty well.       No chest pain or pressure presently.  Most of the time he gets up and does what he wants to do.  Two or three weeks ago had to clean up a tree that fell.  Used a chainsaw to cut the limbs but dragged them away.  Felt chest pain during the dragging and when throwing them.  Also has pinpoint CP that occurs spontaneously he can point exactly to it and goes away spontaneously.   This summer, also had chest pain in the center of his chest with push mowing, going up hill.  Improved with rest and one nitroglycerin.  Has had some exertional DOE.   No SOB/DOE and no PND/Orthopnea.  No weight gain or leg swelling.  No palpitations or syncope.  Noted some CP and SOB with Pre-op Eval PA.   Past Medical History:  Diagnosis Date   Arthritis    Bradycardia    pacemaker - Medtronic Adapta #[id number] - December 22, 2005   Carpal tunnel syndrome on both sides    Carpal tunnel syndrome, bilateral    Chronic back pain    Chronic kidney disease    COPD (chronic obstructive pulmonary disease) (HCC)    Depression     Diabetes mellitus    type II controled with diet   GERD (gastroesophageal reflux disease)    Headache(784.0)    Hyperlipidemia    Hypertension    Insomnia    Neuromuscular disorder (HCC)    peripheral neuropathy BLE   Pneumonia    Presence of permanent cardiac pacemaker    Wears glasses     Past Surgical History:  Procedure Laterality Date   BACK SURGERY     CARPAL TUNNEL RELEASE Right 03/24/2017   Procedure: Right CARPAL TUNNEL RELEASE;  Surgeon: Maeola Harman, MD;  [facility] OR;  Service: Neurosurgery;  Laterality: Right;  Right CARPAL TUNNEL RELEASE   EP IMPLANTABLE DEVICE N/A 06/27/2015   Procedure:  PPM Generator Changeout;  Surgeon: Duke Salvia, MD;  Location: [facility] INVASIVE CV LAB;  Service: Cardiovascular;  Laterality: N/A;   HEMORRHOID SURGERY     INSERT / REPLACE / REMOVE PACEMAKER     MULTIPLE TOOTH EXTRACTIONS     PACEMAKER INSERTION  ?2007   TOTAL HIP ARTHROPLASTY Right 01/25/2013   Procedure: TOTAL HIP ARTHROPLASTY ANTERIOR APPROACH;  Surgeon: Velna Ochs, MD;  Location: [facility] OR;  Service: Orthopedics;  Laterality: Right;   ULNAR NERVE TRANSPOSITION Right 03/24/2017   Procedure: Right  Ulnar nerve release;  Surgeon: Maeola Harman, MD;  Location: [facility] OR;  Service: Neurosurgery;  Laterality: Right;  Right Ulnar nerve release    Current Medications: Current Meds  Medication Sig   acetaminophen (TYLENOL) 650 MG CR tablet Take 1,300 mg daily as needed by mouth for pain.    aspirin 81 MG tablet Take 81 mg by mouth daily.   atorvastatin (LIPITOR) 40 MG tablet Take 40 mg by mouth daily.   CALCIUM-MAGNESIUM-ZINC PO Take 1 capsule by mouth daily.   diltiazem (CARDIZEM SR) 120 MG 12 hr capsule Take 1 capsule (120 mg total) by mouth daily.   docusate sodium (COLACE) 100 MG capsule Take 100 mg by mouth 2 (two) times daily.   famotidine (PEPCID) 20 MG tablet TAKE ONE TABLET BY MOUTH ONCE DAILY AT BEDTIME   Fluticasone-Umeclidin-Vilant (TRELEGY ELLIPTA) 100-62.5-25  MCG/INH AEPB USE 1 INHALATION BY MOUTH  DAILY   furosemide (LASIX) 20 MG tablet Take 1 tablet (20 mg total) by mouth daily.   gabapentin (NEURONTIN) 300 MG capsule Take 300 mg by mouth 2 (two) times daily.   glimepiride (AMARYL) 1 MG tablet Take 1 tablet by mouth daily.   guaiFENesin (MUCINEX) 600 MG 12 hr tablet Take 1 tablet (600 mg total) by mouth 2 (two) times daily.   ipratropium-albuterol (DUONEB) 0.5-2.5 (3) MG/3ML SOLN Inhale 3 mLs into the lungs every 6 (six) hours as needed (wheezing or shortness of breath).    isosorbide mononitrate (IMDUR) 30 MG 24 hr tablet Take 1 tablet (30 mg total) by mouth daily.   levocetirizine (XYZAL) 5 MG tablet Take 5 mg at bedtime by mouth.    montelukast (SINGULAIR) 10 MG tablet Take 10 mg at bedtime by mouth.    Multiple Vitamin (MULTIVITAMIN WITH MINERALS) TABS tablet Take 1 tablet by mouth daily.   nitroGLYCERIN (NITROSTAT) 0.4 MG SL tablet Place 1 tablet (0.4 mg total) under the tongue every 5 (five) minutes as needed for chest pain.   olmesartan (BENICAR) 20 MG tablet Take 20 mg daily by mouth.   pantoprazole (PROTONIX) 40 MG tablet TAKE ONE TABLET BY MOUTH ONCE DAILY TAKE  30-60  MINUTES  BEFORE  FIRST  MEAL  OF  THE  DAY   predniSONE (DELTASONE) 10 MG tablet Take 10 mg by mouth every other day.   PROAIR HFA 108 (90 BASE) MCG/ACT inhaler Inhale 2 puffs into the lungs 4 (four) times daily as needed.   Respiratory Therapy Supplies (FLUTTER) DEVI Blow through 4 times per set, 3 sets per day when needed to clear lungs     Allergies:   Penicillins, Lopressor [metoprolol tartrate], and Codeine   Social History   Socioeconomic History   Marital status: Legally Separated    Spouse name: Not on file   Number of children: 2   Years of education: `   Highest education level: Not on file  Occupational History   Occupation: Retired  Tobacco Use   Smoking status: Former    Packs/day: 2.00    Years: 15.00    Pack years: 30.00    Types: Cigarettes     Start date: 05/05/1958    Quit date: 05/05/1973    Years since quitting: 47.5   Smokeless tobacco: Never  Vaping Use   Vaping Use: Never used  Substance and Sexual Activity   Alcohol use: No    Alcohol/week: 0.0 standard drinks   Drug use: No   Sexual activity: Not on file  Other Topics Concern   Not  on file  Social History Narrative   Not on file   Social Determinants of Health   Financial Resource Strain: Not on file  Food Insecurity: Not on file  Transportation Needs: Not on file  Physical Activity: Not on file  Stress: Not on file  Social Connections: Not on file     Family History: The patient's family history includes Allergies in an other family member; Asthma in his maternal aunt; Heart disease in his mother.  ROS:   Please see the history of present illness.    All other systems reviewed and are negative.  EKGs/Labs/Other Studies Reviewed:    The following studies were reviewed today:  EKG:  05/08/20: Possible sinus rhythm (low amplitude P wave) 1st HB, iRBBB 04/24/20: SR with 1st HB rate 77 with PACs and PVCs  Transthoracic Echocardiogram: Date:08/29/2014 Results: Study Conclusions  - Left ventricle: The cavity size was normal. There was mild focal    basal hypertrophy of the septum. Systolic function was normal.    The estimated ejection fraction was in the range of 55% to 60%.    Wall motion was normal; there were no regional wall motion    abnormalities. Doppler parameters are consistent with abnormal    left ventricular relaxation (grade 1 diastolic dysfunction).  - Left atrium: The atrium was mildly dilated.  - Pulmonary arteries: Systolic pressure was mildly increased. PA    peak pressure: 34 mm Hg (S).   Cardiac CT: Date: 03/01/2019 Results: Aortic Atherosclerosis, Left Dominant System, LM CAC Left main is a large artery that gives rise to LAD and LCX arteries. Minimal (<25%) calcified plaque.   LAD is a large vessel. There is minimal (<25%)  mixed plaque in the ostium and mild (25-49%) mixed plaque proximally. There is a short segment of myocardial bridging distally. There is a large, branching D1 with minimal calcified plaque proximally and mild calcified plaque at the bifurcation. D2 and D3 are small and without significant stenosis.   LCX is a large, dominant artery that gives rise to one large OM1 branch. There is scattered minimal calcified plaque. There is a small OM1, medium OM2, large OM3 and small OM4 without evidence of significant disease.  NM Stress Testing: Date: 01/25/2013 Results: Normal Stress per report   Recent Labs: 04/24/2020: ALT 20; B Natriuretic Peptide 43.6; Hemoglobin 13.6; Platelets 276 06/29/2020: BUN 18; Creatinine, Ser 1.17; Magnesium 1.9; NT-Pro BNP 153; Potassium 3.7; Sodium 140  Recent Lipid Panel No results found for: CHOL, TRIG, HDL, CHOLHDL, VLDL, LDLCALC, LDLDIRECT   Risk Assessment/Calculations:     N/A  Physical Exam:    VS:  BP 126/78   Pulse 64   Ht 5\' 11"  (1.803 m)   Wt 99.8 kg   BMI 30.68 kg/m     Wt Readings from Last 3 Encounters:  11/22/20 99.8 kg  06/22/20 105.7 kg  06/08/20 104.3 kg    GEN:  Well nourished, well developed in no acute distress HEENT: Normal NECK: No JVD; No carotid bruits LYMPHATICS: No lymphadenopathy CARDIAC: RRR, no murmurs, rubs, gallops RESPIRATORY:  Clear to auscultation without rales, wheezing or rhonchi  ABDOMEN: Soft, non-tender, non-distended, thought with slight dullness to percussion MUSCULOSKELETAL:  slight nonpitting edema bilaterally; No deformity  SKIN: Warm without visible deformity NEUROLOGIC:  Alert and oriented x 3 PSYCHIATRIC:  Normal affect   ASSESSMENT:    No diagnosis found.  PLAN:     Exertional CP Coronary Artery Disease; Nonobstructive HLD Aortic Atherosclerosis - asymptomatic  -  anatomy: minimal non obstructive disease in 01/2019 - continue ASA 81 mg; - goal LDL < 70 ; starting atorvastatin 80 mg and  lipids/LFTs in 3 months - continue diltiazem (BB held in the setting of COPD) - Imdur 30 mg Po Daily may be helping per patient; will keep mediation - Would recommend pharmacological nuclear medicine stress test (NPO at midnight/holding CCB and Nitro prior); discussed risks, benefits, and alternatives of the diagnostic procedure including chest pain, arrhythmia, and death.  Patient amenable for testing.  Patient is going for low risk surgery next week and has greater than 4 fMETs.  The surgery itself causes low cardiac risk, but we discussed that in the setting of chest pain being worked up for angina; some surgeon's may choose to defer non time-sensitive surgeries  Heart Failure Preserved Ejection Fraction  COPD- seeing PCCM Frequent PVCs- seen by Dr. Graciela Husbands PPM per Dr. Graciela Husbands DM with HTN - NYHA class II, Stage B, hypervolemic - Diuretic regimen: controlled on lasix 20 mg PO Daily - re-discussed fluid, salt, and weigh restrictrions - could consider future SGLT2i   Winter follow up unless new symptoms or abnormal test results warranting change in plan  Would be reasonable for  Video Visit Follow up  Would be reasonable for  APP Follow up   Medication Adjustments/Labs and Tests Ordered: Current medicines are reviewed at length with the patient today.  Concerns regarding medicines are outlined above.  No orders of the defined types were placed in this encounter.  No orders of the defined types were placed in this encounter.   There are no Patient Instructions on file for this visit.   Signed, Christell Constant, MD  11/22/2020 9:33 AM    [facility]

## 2020-11-22 ENCOUNTER — Encounter: Payer: Self-pay | Admitting: Internal Medicine

## 2020-11-22 ENCOUNTER — Other Ambulatory Visit: Payer: Self-pay

## 2020-11-22 ENCOUNTER — Ambulatory Visit (INDEPENDENT_AMBULATORY_CARE_PROVIDER_SITE_OTHER): Payer: Medicare Other | Admitting: Internal Medicine

## 2020-11-22 VITALS — BP 126/78 | HR 64 | Ht 71.0 in | Wt 220.0 lb

## 2020-11-22 DIAGNOSIS — R079 Chest pain, unspecified: Secondary | ICD-10-CM

## 2020-11-22 DIAGNOSIS — I700 Atherosclerosis of aorta: Secondary | ICD-10-CM | POA: Diagnosis not present

## 2020-11-22 DIAGNOSIS — E782 Mixed hyperlipidemia: Secondary | ICD-10-CM

## 2020-11-22 DIAGNOSIS — I5032 Chronic diastolic (congestive) heart failure: Secondary | ICD-10-CM | POA: Diagnosis not present

## 2020-11-22 MED ORDER — ATORVASTATIN CALCIUM 80 MG PO TABS: 80.0000 mg | ORAL_TABLET | Freq: Every day | ORAL | 3 refills | Status: DC

## 2020-11-22 NOTE — Patient Instructions (Signed)
Medication Instructions:  Your physician has recommended you make the following change in your medication:   INCREASE the Atorvastatin to 80 mg taking 1 daily  *If you need a refill on your cardiac medications before your next appointment, please call your pharmacy*   Lab Work: 3 MONTHS:  FASTING LIPID & LFT   If you have labs (blood work) drawn today and your tests are completely normal, you will receive your results only by: MyChart Message (if you have MyChart) OR A paper copy in the mail If you have any lab test that is abnormal or we need to change your treatment, we will call you to review the results.   Testing/Procedures: Your physician has requested that you have a lexiscan myoview. For further information please visit [email or web address] Please follow instruction sheet, BELOW:    You are scheduled for a Myocardial Perfusion Imaging Study on:   Please arrive 15 minutes prior to your appointment time for registration and insurance purposes.  The test will take approximately 3 to 4 hours to complete; you may bring reading material.  If someone comes with you to your appointment, they will need to remain in the main lobby due to limited space in the testing area. **If you are pregnant or breastfeeding, please notify the nuclear lab prior to your appointment**  How to prepare for your Myocardial Perfusion Test: Do not eat or drink 3 hours prior to your test, except you may have water. Do not consume products containing caffeine (regular or decaffeinated) 12 hours prior to your test. (ex: coffee, chocolate, sodas, tea). Do bring a list of your current medications with you.  If not listed below, you may take your medications as normal. Do not take IMDUR OR DILTIAZEM THE MORNING OF YOUR TEST, PER DR. CHANDRASEKHAR.  Bring the medication to your appointment as you may be required to take it once the test is complete. Do wear comfortable clothes (no dresses or overalls) and walking  shoes, tennis shoes preferred (No heels or open toe shoes are allowed). Do NOT wear cologne, perfume, aftershave, or lotions (deodorant is allowed). If these instructions are not followed, your test will have to be rescheduled.     Follow-Up: At [facility], you and your health needs are our priority.  As part of our continuing mission to provide you with exceptional heart care, we have created designated Provider Care Teams.  These Care Teams include your primary Cardiologist (physician) and Advanced Practice Providers (APPs -  Physician Assistants and Nurse Practitioners) who all work together to provide you with the care you need, when you need it.  We recommend signing up for the patient portal called "MyChart".  Sign up information is provided on this After Visit Summary.  MyChart is used to connect with patients for Virtual Visits (Telemedicine).  Patients are able to view lab/test results, encounter notes, upcoming appointments, etc.  Non-urgent messages can be sent to your provider as well.   To learn more about what you can do with MyChart, go to [email or web address].    Your next appointment:   4-5 month(s)  The format for your next appointment:   In Person  Provider:   You may see Christell Constant, MD or one of the following Advanced Practice Providers on your designated Care Team:   Ronie Spies, PA-C Jacolyn Reedy, PA-C   Other Instructions

## 2020-11-27 ENCOUNTER — Ambulatory Visit: Payer: Medicare Other | Admitting: Internal Medicine

## 2020-11-28 DIAGNOSIS — H02831 Dermatochalasis of right upper eyelid: Secondary | ICD-10-CM | POA: Diagnosis not present

## 2020-11-28 DIAGNOSIS — H02413 Mechanical ptosis of bilateral eyelids: Secondary | ICD-10-CM | POA: Diagnosis not present

## 2020-11-28 DIAGNOSIS — H02412 Mechanical ptosis of left eyelid: Secondary | ICD-10-CM | POA: Diagnosis not present

## 2020-11-28 DIAGNOSIS — H02411 Mechanical ptosis of right eyelid: Secondary | ICD-10-CM | POA: Diagnosis not present

## 2020-11-28 DIAGNOSIS — H53483 Generalized contraction of visual field, bilateral: Secondary | ICD-10-CM | POA: Diagnosis not present

## 2020-11-28 DIAGNOSIS — H57813 Brow ptosis, bilateral: Secondary | ICD-10-CM | POA: Diagnosis not present

## 2020-11-28 DIAGNOSIS — H02423 Myogenic ptosis of bilateral eyelids: Secondary | ICD-10-CM | POA: Diagnosis not present

## 2020-11-28 DIAGNOSIS — H02834 Dermatochalasis of left upper eyelid: Secondary | ICD-10-CM | POA: Diagnosis not present

## 2020-11-30 ENCOUNTER — Other Ambulatory Visit: Payer: Self-pay | Admitting: Internal Medicine

## 2020-11-30 NOTE — Telephone Encounter (Signed)
Prednisone refill sent to [facility]

## 2020-11-30 NOTE — Telephone Encounter (Signed)
Would you like to refill prednisone? Last note states he was starting a Prednisone taper.

## 2020-12-04 ENCOUNTER — Encounter (HOSPITAL_COMMUNITY): Payer: Medicare Other

## 2020-12-06 ENCOUNTER — Telehealth (HOSPITAL_COMMUNITY): Payer: Self-pay | Admitting: *Deleted

## 2020-12-06 NOTE — Telephone Encounter (Signed)
Left message on voicemail per DPR in reference to upcoming appointment scheduled on 12/14/20 at 7:45 with detailed instructions given per Myocardial Perfusion Study Information Sheet for the test. LM to arrive 15 minutes early, and that it is imperative to arrive on time for appointment to keep from having the test rescheduled. If you need to cancel or reschedule your appointment, please call the office within 24 hours of your appointment. Failure to do so may result in a cancellation of your appointment, and a $50 no show fee. Phone number given for call back for any questions.

## 2020-12-06 NOTE — Telephone Encounter (Signed)
Patient given detailed instructions per Myocardial Perfusion Study Information Sheet for the test on 12/14/20 at 7:45. Patient notified to arrive 15 minutes early and that it is imperative to arrive on time for appointment to keep from having the test rescheduled.  If you need to cancel or reschedule your appointment, please call the office within 24 hours of your appointment. . Patient verbalized understanding.[name] [name]

## 2020-12-13 ENCOUNTER — Encounter (INDEPENDENT_AMBULATORY_CARE_PROVIDER_SITE_OTHER): Payer: Self-pay | Admitting: Ophthalmology

## 2020-12-13 ENCOUNTER — Other Ambulatory Visit: Payer: Self-pay

## 2020-12-13 ENCOUNTER — Ambulatory Visit (INDEPENDENT_AMBULATORY_CARE_PROVIDER_SITE_OTHER): Payer: Medicare Other | Admitting: Ophthalmology

## 2020-12-13 DIAGNOSIS — H35033 Hypertensive retinopathy, bilateral: Secondary | ICD-10-CM | POA: Diagnosis not present

## 2020-12-13 DIAGNOSIS — H43811 Vitreous degeneration, right eye: Secondary | ICD-10-CM

## 2020-12-13 DIAGNOSIS — Z961 Presence of intraocular lens: Secondary | ICD-10-CM | POA: Diagnosis not present

## 2020-12-13 DIAGNOSIS — I10 Essential (primary) hypertension: Secondary | ICD-10-CM

## 2020-12-13 DIAGNOSIS — H3581 Retinal edema: Secondary | ICD-10-CM | POA: Diagnosis not present

## 2020-12-13 DIAGNOSIS — E119 Type 2 diabetes mellitus without complications: Secondary | ICD-10-CM | POA: Diagnosis not present

## 2020-12-13 NOTE — Progress Notes (Signed)
[facility] - Clinic Note  12/13/2020     CHIEF COMPLAINT Patient presents for Retina Evaluation   HISTORY OF PRESENT ILLNESS: [name] [name] is a 75 y.o. male who presents to the clinic today for:   HPI     Retina Evaluation   In right eye.  This started 3 weeks ago.  Duration of 3 weeks.  Associated Symptoms Floaters.  Negative for Flashes, Distortion, Blind Spot, Pain, Redness, Photophobia, Glare, Trauma, Scalp Tenderness, Jaw Claudication, Shoulder/Hip pain, Fever, Weight Loss and Fatigue.  Context:  distance vision, mid-range vision and near vision.  Treatments tried include no treatments.  I, the attending physician,  performed the HPI with the patient and updated documentation appropriately.        Comments   Patient complains of floater OD for the past 3 weeks. No flashes. No eye pain. Patient is diabetic. Last BS unknown. Last a1c was less than 7, checked 5 months ago.       Last edited by Rennis Chris, MD on 12/13/2020  2:46 PM.    Patient states: OD floater started after eyelid sx x3 weeks ago.  Cataract sx about a year ago.  States Dr. Delynn Flavin saw something in the bottom of OD.  Denies FOLs.  Floater may be a little worse.  Eyelid sx 07.15.22  Referring physician: Steele Sizer, MD [address]  [place], [place] [address]  HISTORICAL INFORMATION:   Selected notes from the MEDICAL RECORD NUMBER Referred by Dr. Delynn Flavin LEE: 08.11.22 Ocular Hx- possible tear OD    CURRENT MEDICATIONS: Current Outpatient Medications (Ophthalmic Drugs)  Medication Sig   ketorolac (ACULAR) 0.5 % ophthalmic solution Place 1 drop into the left eye 4 (four) times daily. (Patient not taking: No sig reported)   No current facility-administered medications for this visit. (Ophthalmic Drugs)   Current Outpatient Medications (Other)  Medication Sig   acetaminophen (TYLENOL) 650 MG CR tablet Take 1,300 mg daily as needed by mouth for pain.     aspirin 81 MG tablet Take 81 mg by mouth daily.   atorvastatin (LIPITOR) 80 MG tablet Take 1 tablet (80 mg total) by mouth daily.   CALCIUM-MAGNESIUM-ZINC PO Take 1 capsule by mouth daily.   diltiazem (CARDIZEM SR) 120 MG 12 hr capsule Take 1 capsule (120 mg total) by mouth daily.   docusate sodium (COLACE) 100 MG capsule Take 100 mg by mouth 2 (two) times daily.   famotidine (PEPCID) 20 MG tablet TAKE ONE TABLET BY MOUTH ONCE DAILY AT BEDTIME   Fluticasone-Umeclidin-Vilant (TRELEGY ELLIPTA) 100-62.5-25 MCG/INH AEPB USE 1 INHALATION BY MOUTH  DAILY   furosemide (LASIX) 20 MG tablet Take 1 tablet (20 mg total) by mouth daily.   gabapentin (NEURONTIN) 300 MG capsule Take 300 mg by mouth 2 (two) times daily.   glimepiride (AMARYL) 1 MG tablet Take 1 tablet by mouth daily.   guaiFENesin (MUCINEX) 600 MG 12 hr tablet Take 1 tablet (600 mg total) by mouth 2 (two) times daily.   ipratropium-albuterol (DUONEB) 0.5-2.5 (3) MG/3ML SOLN Inhale 3 mLs into the lungs every 6 (six) hours as needed (wheezing or shortness of breath).    isosorbide mononitrate (IMDUR) 30 MG 24 hr tablet Take 1 tablet (30 mg total) by mouth daily.   levocetirizine (XYZAL) 5 MG tablet Take 5 mg at bedtime by mouth.    montelukast (SINGULAIR) 10 MG tablet Take 10 mg at bedtime by mouth.    Multiple Vitamin (MULTIVITAMIN WITH  MINERALS) TABS tablet Take 1 tablet by mouth daily.   nitroGLYCERIN (NITROSTAT) 0.4 MG SL tablet Place 1 tablet (0.4 mg total) under the tongue every 5 (five) minutes as needed for chest pain.   olmesartan (BENICAR) 20 MG tablet Take 20 mg daily by mouth.   pantoprazole (PROTONIX) 40 MG tablet TAKE ONE TABLET BY MOUTH ONCE DAILY TAKE  30-60  MINUTES  BEFORE  FIRST  MEAL  OF  THE  DAY   predniSONE (DELTASONE) 10 MG tablet 1 every other day   PROAIR HFA 108 (90 BASE) MCG/ACT inhaler Inhale 2 puffs into the lungs 4 (four) times daily as needed.   Respiratory Therapy Supplies (FLUTTER) DEVI Blow through 4 times per  set, 3 sets per day when needed to clear lungs   No current facility-administered medications for this visit. (Other)      REVIEW OF SYSTEMS: ROS   Positive for: Gastrointestinal, Genitourinary, Musculoskeletal, Endocrine, Cardiovascular, Eyes, Respiratory Negative for: Constitutional, Neurological, Skin, HENT, Psychiatric, Allergic/Imm, Heme/Lymph Last edited by Doreene Nest, COT on 12/13/2020  2:33 PM.       ALLERGIES Allergies  Allergen Reactions   Penicillins Anaphylaxis    Has patient had a PCN reaction causing immediate rash, facial/tongue/throat swelling, SOB or lightheadedness with hypotension: Yes Has patient had a PCN reaction causing severe rash involving mucus membranes or skin necrosis: No Has patient had a PCN reaction that required hospitalization: Yes Has patient had a PCN reaction occurring within the last 10 years: No If all of the above answers are "NO", then may proceed with Cephalosporin use.    Lopressor [Metoprolol Tartrate]     " It drops my heart rate."   Codeine Rash    PAST MEDICAL HISTORY Past Medical History:  Diagnosis Date   Arthritis    Bradycardia    pacemaker - Medtronic Adapta #[id number] - December 22, 2005   Carpal tunnel syndrome on both sides    Carpal tunnel syndrome, bilateral    Chronic back pain    Chronic kidney disease    COPD (chronic obstructive pulmonary disease) (HCC)    Depression    Diabetes mellitus    type II controled with diet   GERD (gastroesophageal reflux disease)    Headache(784.0)    Hyperlipidemia    Hypertension    Insomnia    Neuromuscular disorder (HCC)    peripheral neuropathy BLE   Pneumonia    Presence of permanent cardiac pacemaker    Wears glasses    Past Surgical History:  Procedure Laterality Date   BACK SURGERY     CARPAL TUNNEL RELEASE Right 03/24/2017   Procedure: Right CARPAL TUNNEL RELEASE;  Surgeon: Maeola Harman, MD;  Location: [facility] OR;  Service: Neurosurgery;  Laterality: Right;  Right  CARPAL TUNNEL RELEASE   CATARACT EXTRACTION     EP IMPLANTABLE DEVICE N/A 06/27/2015   Procedure:  PPM Generator Changeout;  Surgeon: Duke Salvia, MD;  Location: [facility] INVASIVE CV LAB;  Service: Cardiovascular;  Laterality: N/A;   EYE SURGERY     HEMORRHOID SURGERY     INSERT / REPLACE / REMOVE PACEMAKER     MULTIPLE TOOTH EXTRACTIONS     PACEMAKER INSERTION  ?2007   TOTAL HIP ARTHROPLASTY Right 01/25/2013   Procedure: TOTAL HIP ARTHROPLASTY ANTERIOR APPROACH;  Surgeon: Velna Ochs, MD;  Location: [facility] OR;  Service: Orthopedics;  Laterality: Right;   ULNAR NERVE TRANSPOSITION Right 03/24/2017   Procedure: Right Ulnar nerve release;  Surgeon: Maeola Harman,  MD;  Location: [facility] OR;  Service: Neurosurgery;  Laterality: Right;  Right Ulnar nerve release    FAMILY HISTORY Family History  Problem Relation Age of Onset   Heart disease Mother    Asthma Maternal Aunt    Allergies Other        whole family    SOCIAL HISTORY Social History   Tobacco Use   Smoking status: Former    Packs/day: 2.00    Years: 15.00    Pack years: 30.00    Types: Cigarettes    Start date: 05/05/1958    Quit date: 05/05/1973    Years since quitting: 47.6   Smokeless tobacco: Never  Vaping Use   Vaping Use: Never used  Substance Use Topics   Alcohol use: No    Alcohol/week: 0.0 standard drinks   Drug use: No         OPHTHALMIC EXAM:  Base Eye Exam     Visual Acuity (Snellen - Linear)       Right Left   Dist cc 20/30 20/25   Dist ph cc 20/25 +1 20/20 -1    Correction: Glasses         Tonometry (Tonopen, 2:43 PM)       Right Left   Pressure 19 19         Pupils       Dark Light Shape React APD   Right 8 8 Round None None   Left 4 3 Round Brisk None         Visual Fields (Counting fingers)       Left Right    Full Full         Extraocular Movement       Right Left    Full, Ortho Full, Ortho         Neuro/Psych     Oriented x3: Yes   Mood/Affect: Normal          Dilation     Both eyes: 1.0% Mydriacyl, 2.5% Phenylephrine @ 2:43 PM           Slit Lamp and Fundus Exam     External Exam       Right Left   External Normal Normal         Slit Lamp Exam       Right Left   Lids/Lashes Dermatochalasis - upper lid Dermatochalasis - upper lid   Conjunctiva/Sclera temporal pinguecula   Mild temporal pinguecula    Cornea arcus, Punctate epithelial erosions Arcus, Punctate epithelial erosions, Debris in tear film, Well healed temporal cataract wound   Anterior Chamber Deep and quiet Deep and quiet   Iris Round and reactive Round and reactive   Lens PC IOL in good position PC IOL in good position   Vitreous Vitreous syneresis, no pigment, Posterior vitreous detachment, prominent white vitreous condensations inferiorly  Vitreous syneresis         Fundus Exam       Right Left   Disc Pink and sharp, Compact Pink and sharp, Compact   C/D Ratio 0.3 0.2   Macula Flat, Blunted foveal reflex, RPE mottling, No edema, No heme Flat, Blunted foveal reflex mild RPE Mottling, no edema, no heme   Vessels Mild Vascular attenuation Mild vascular attenuation   Periphery Normal Normal           Refraction     Wearing Rx       Sphere Cylinder Axis Add   Right -2.00 +1.50  005 +2.50   Left -1.25 +1.75 170 +2.50         Manifest Refraction       Sphere Cylinder Axis Dist VA   Right -2.00 +1.50 175 20/30   Left -0.75 +1.75 170 20/20            IMAGING AND PROCEDURES  Imaging and Procedures for 12/13/2020  OCT, Retina - OU - Both Eyes       Right Eye Quality was good. Central Foveal Thickness: 300. Progression has been stable. Findings include normal foveal contour, no IRF, no SRF.   Left Eye Quality was good. Central Foveal Thickness: 305. Progression has been stable. Findings include normal foveal contour, no IRF, no SRF.   Notes *Images captured and stored on drive  Diagnosis / Impression:  NFP; no IRF/SRF  OU  Clinical management:  See below  Abbreviations: NFP - Normal foveal profile. CME - cystoid macular edema. PED - pigment epithelial detachment. IRF - intraretinal fluid. SRF - subretinal fluid. EZ - ellipsoid zone. ERM - epiretinal membrane. ORA - outer retinal atrophy. ORT - outer retinal tubulation. SRHM - subretinal hyper-reflective material. IRHM - intraretinal hyper-reflective material              ASSESSMENT/PLAN:    ICD-10-CM   1. Posterior vitreous detachment of right eye  H43.811     2. Retinal edema  H35.81 OCT, Retina - OU - Both Eyes    3. Diabetes mellitus type 2 without retinopathy (HCC)  E11.9     4. Essential hypertension  I10     5. Hypertensive retinopathy of both eyes  H35.033     6. Pseudophakia of both eyes  Z96.1       1,2. PVD / vitreous syneresis OD  - symptomatic floaters OD x 3 wks; denies photopsias  - Discussed findings and prognosis  - No RT or RD on 360 peripheral exam  - Reviewed s/s of RT/RD  - Strict return precautions for any such RT/RD signs/symptoms  - f/u in 4 wks -- DFE/OCT   3. Diabetes mellitus, type 2 without retinopathy - The incidence, risk factors for progression, natural history and treatment options for diabetic retinopathy  were discussed with patient.   - The need for close monitoring of blood glucose, blood pressure, and serum lipids, avoiding cigarette or any type of tobacco, and the need for long term follow up was also discussed with patient. - monitor  4,5. Hypertensive retinopathy OU - discussed importance of tight BP control - monitor   6. Pseudophakia OU  - s/p CE/IOL OU  - IOLs in good position, doing well  - monitor    Ophthalmic Meds Ordered this visit:  No orders of the defined types were placed in this encounter.      Return in about 4 weeks (around 01/10/2021) for PVD OS - DFE, OCT.  There are no Patient Instructions on file for this visit.   Explained the diagnoses, plan, and follow up  with the patient and they expressed understanding.  Patient expressed understanding of the importance of proper follow up care.   This document serves as a record of services personally performed by Karie Chimera, MD, PhD. It was created on their behalf by Joni Reining, an ophthalmic technician. The creation of this record is the provider's dictation and/or activities during the visit.    Electronically signed by: Joni Reining COA, 12/13/20  7:19 PM  Karie Chimera, M.D., Ph.D. Diseases & Surgery  of the Retina and Vitreous [facility]  I have reviewed the above documentation for accuracy and completeness, and I agree with the above. Karie ChimeraBrian G. Orlean Holtrop, M.D., Ph.D. 12/13/20 7:24 PM   Abbreviations: M myopia (nearsighted); A astigmatism; H hyperopia (farsighted); P presbyopia; Mrx spectacle prescription;  CTL contact lenses; OD right eye; OS left eye; OU both eyes  XT exotropia; ET esotropia; PEK punctate epithelial keratitis; PEE punctate epithelial erosions; DES dry eye syndrome; MGD meibomian gland dysfunction; ATs artificial tears; PFAT's preservative free artificial tears; NSC nuclear sclerotic cataract; PSC posterior subcapsular cataract; ERM epi-retinal membrane; PVD posterior vitreous detachment; RD retinal detachment; DM diabetes mellitus; DR diabetic retinopathy; NPDR non-proliferative diabetic retinopathy; PDR proliferative diabetic retinopathy; CSME clinically significant macular edema; DME diabetic macular edema; dbh dot blot hemorrhages; CWS cotton wool spot; POAG primary open angle glaucoma; C/D cup-to-disc ratio; HVF humphrey visual field; GVF goldmann visual field; OCT optical coherence tomography; IOP intraocular pressure; BRVO Branch retinal vein occlusion; CRVO central retinal vein occlusion; CRAO central retinal artery occlusion; BRAO branch retinal artery occlusion; RT retinal tear; SB scleral buckle; PPV pars plana vitrectomy; VH Vitreous hemorrhage; PRP  panretinal laser photocoagulation; IVK intravitreal kenalog; VMT vitreomacular traction; MH Macular hole;  NVD neovascularization of the disc; NVE neovascularization elsewhere; AREDS age related eye disease study; ARMD age related macular degeneration; POAG primary open angle glaucoma; EBMD epithelial/anterior basement membrane dystrophy; ACIOL anterior chamber intraocular lens; IOL intraocular lens; PCIOL posterior chamber intraocular lens; Phaco/IOL phacoemulsification with intraocular lens placement; PRK photorefractive keratectomy; LASIK laser assisted in situ keratomileusis; HTN hypertension; DM diabetes mellitus; COPD chronic obstructive pulmonary disease

## 2020-12-14 ENCOUNTER — Encounter (INDEPENDENT_AMBULATORY_CARE_PROVIDER_SITE_OTHER): Payer: Medicare Other | Admitting: Ophthalmology

## 2020-12-14 ENCOUNTER — Ambulatory Visit (HOSPITAL_COMMUNITY): Payer: Medicare Other | Attending: Cardiology

## 2020-12-14 DIAGNOSIS — R079 Chest pain, unspecified: Secondary | ICD-10-CM | POA: Insufficient documentation

## 2020-12-14 LAB — MYOCARDIAL PERFUSION IMAGING
LV dias vol: 81 mL (ref 62–150)
LV sys vol: 40 mL
Peak HR: 77 {beats}/min
Rest HR: 67 {beats}/min
SDS: 2
SRS: 1
SSS: 3
TID: 0.98

## 2020-12-14 MED ORDER — TECHNETIUM TC 99M TETROFOSMIN IV KIT
31.3000 | PACK | Freq: Once | INTRAVENOUS | Status: AC | PRN
  Administered 2020-12-14: 31.3 via INTRAVENOUS
  Filled 2020-12-14: qty 32

## 2020-12-14 MED ORDER — TECHNETIUM TC 99M TETROFOSMIN IV KIT
10.1000 | PACK | Freq: Once | INTRAVENOUS | Status: AC | PRN
  Administered 2020-12-14: 10.1 via INTRAVENOUS
  Filled 2020-12-14: qty 11

## 2020-12-14 MED ORDER — REGADENOSON 0.4 MG/5ML IV SOLN
0.4000 mg | Freq: Once | INTRAVENOUS | Status: AC
  Administered 2020-12-14: 0.4 mg via INTRAVENOUS

## 2020-12-17 ENCOUNTER — Telehealth: Payer: Self-pay | Admitting: Internal Medicine

## 2020-12-17 DIAGNOSIS — I5032 Chronic diastolic (congestive) heart failure: Secondary | ICD-10-CM

## 2020-12-17 NOTE — Telephone Encounter (Signed)
Patient returning call for stress test results. 

## 2020-12-17 NOTE — Telephone Encounter (Signed)
-----   Message from Christell Constant, MD sent at 12/16/2020  9:37 AM EDT ----- Results: Reduced EF with no evidence of ischemia Plan: Echo  Christell Constant, MD

## 2020-12-17 NOTE — Telephone Encounter (Signed)
Order placed for Echo.  Pt concerned about financing I encouraged pt to contact insurance company regarding cost.  I will route this message to billing also.

## 2021-01-02 NOTE — Progress Notes (Signed)
HPI  M former smoker with COPD, chronic cough, OSA complicated by Cardiac dysrhythmia/ pacemaker, HBP  Office Spirometry 08/11/14-moderate obstruction/restriction-FVC 2.95/62%, FEV1 2.37/65%, ratio 0.79, FEF 25-75% 2.43/77% PFT 10/26/2017-normal flows without response to bronchodilator, moderate diffusion defect. Walk Test O2 Qualifying-04/26/2018-qualified for portable oxygen-during his second lap heart rate reached 112 and saturation fell to 86% on room air Labs 04/26/2018- IgE 464, EOS wnl HST 05/17/18>> Severe obstructive sleep apnea AHI:33 CPAP to BIPAP titration 02/18/2019-  BIPAP 10/6, if not tolerated try CPAP 7  -----------------------------------------------------------------------------------------    05/11/20- 75 year old male former smoker followed for COPD, restrictive lung disease, cough, OSA, complicated by CAD, cardiac arrhythmia/Pacemaker/ ICD, HBP, DM CPAP BIPAP 10/6/ [employer] 100, Proair hfa, prednisone 10 mg QOD, Singulair, neb Duoneb,  Download- compliance 63%, AHI 1.2/ hr Body weight today- 232 lbs Covid vax-3 Moderna Flu vax-had Recent exacerbation> Zpak, prednisone, then doxycycline, then ED 12/21->>solumedrol, nebs, pred taper. Neg for Covid and Flu 12/21. Cardiology assessed 1/4 for exertional chest pain> Imdur, lasix -----Patient has been coughing since Christmas, patient states that it is a dry cough. Worse at night. Patient was started on Lasix and Imdur on Tuesday. Daughter [name] here. They say lasix, prednisone and Hycodan each helped "some". They relate this to his experience of 2 years ago when sustained cough only gradually responded to prednisone.  Continues acid blockers without obvious refux/ heartburn. Cough is worse supine.  CXR 1V 04/24/20-  IMPRESSION: 1. No acute intrathoracic process.  01/03/21- 75 year old male former smoker followed for COPD, restrictive lung disease, cough, OSA, complicated by CAD, CHF, cardiac  arrhythmia/Pacemaker/ ICD, HBP, DM  BIPAP 10/6/ [employer] 10 S machine -Trelegy 100, Proair hfa, prednisone 10 mg QOD, Singulair, neb Duoneb, Download- compliance 67%, AHI 3.7/ hr Body weight today-220 lbs Covid vax-3 Moderna -----Having a cough with brownish/yellowish sputum. Muscle aches and pains affect sleep, but otherwise feels pretty well. Incidental occasional cough with sometimes dark sputum. Denies cough today and doesn't feel he needs to do anything. He pleads "too many appointments" and declines referral for mask fitting now.    ROS-see HPI   + = positive Constitutional:    weight loss, night sweats, fevers, chills, fatigue, lassitude. HEENT:    headaches, difficulty swallowing, tooth/dental problems, sore throat,       sneezing, itching, ear ache, +nasal congestion, +post nasal drip, snoring CV:    +chest pain, orthopnea, PND, swelling in lower extremities, anasarca,                                                         dizziness, palpitations Resp:   +shortness of breath with exertion or at rest.                +productive cough,   + non-productive cough, coughing up of blood.              change in color of mucus.  +wheezing.   Skin:    rash or lesions. GI:  No-   heartburn, indigestion, abdominal pain, nausea, vomiting, GU:  MS:   joint pain, stiffness,  Neuro-     nothing unusual Psych:  change in mood or affect.  depression or anxiety.   memory loss.  OBJ- Physical Exam General- Alert, Oriented, Affect-appropriate, Distress- none acute, +  overweight Skin- rash-none, lesions- none, excoriation- none Lymphadenopathy- none Head- atraumatic            Eyes- Gross vision intact, PERRLA, conjunctivae and secretions clear            Ears- Hearing, canals-normal            Nose- Clear, no-Septal dev, mucus, polyps, erosion, perforation             Throat- Mallampati II , mucosa clear , drainage-none, tonsils- atrophic, edentulous+, not hoarse Neck-  flexible , trachea midline, no stridor , thyroid nl, carotid no bruit Chest - symmetrical excursion , unlabored           Heart/CV- RRR , no murmur , no gallop  , no rub, nl s1 s2                           - JVD- none , edema- none, stasis changes- none, varices- none           Lung- +clear, cough-none, dullness-none, rub- none,            Chest wall- +L pacemaker Abd-  Br/ Gen/ Rectal- Not done, not indicated Extrem- cyanosis- none, clubbing, none, atrophy- none, strength- nl, +cane Neuro- grossly intact to observation

## 2021-01-03 ENCOUNTER — Ambulatory Visit (INDEPENDENT_AMBULATORY_CARE_PROVIDER_SITE_OTHER): Payer: Medicare Other | Admitting: Internal Medicine

## 2021-01-03 ENCOUNTER — Other Ambulatory Visit: Payer: Self-pay

## 2021-01-03 DIAGNOSIS — G4733 Obstructive sleep apnea (adult) (pediatric): Secondary | ICD-10-CM

## 2021-01-03 DIAGNOSIS — J449 Chronic obstructive pulmonary disease, unspecified: Secondary | ICD-10-CM | POA: Diagnosis not present

## 2021-01-03 MED ORDER — IPRATROPIUM-ALBUTEROL 0.5-2.5 (3) MG/3ML IN SOLN: 3.0000 mL | Freq: Four times a day (QID) | RESPIRATORY_TRACT | 12 refills | Status: DC | PRN

## 2021-01-03 NOTE — Patient Instructions (Signed)
Nebulizer solution refilled  We can continue routine meds.  We can continue BIPAP 10/6. If you decide the mask issue is bothering enough to get some help, let us know and we can get you in to see someone for mask fitting.  You can also ask at [facility] if they have any new designs to look at.

## 2021-01-08 ENCOUNTER — Encounter: Payer: Self-pay | Admitting: Internal Medicine

## 2021-01-08 NOTE — Assessment & Plan Note (Addendum)
Benefits from BIPAP Plan - continue BIPAP 10/6. Can offer mask fitting again in future if needed

## 2021-01-08 NOTE — Assessment & Plan Note (Signed)
Alternate day maintenance prednisone has donee a lot to stabilize him.  He can continue current meds.

## 2021-01-08 NOTE — Progress Notes (Signed)
[facility] - Clinic Note  01/11/2021     CHIEF COMPLAINT Patient presents for Retina Follow Up   HISTORY OF PRESENT ILLNESS: [name] [name] is a 75 y.o. male who presents to the clinic today for:   HPI     Retina Follow Up   Patient presents with  PVD.  In right eye.  This started 7 weeks ago.  Severity is mild.  Duration of 4 weeks.  Since onset it is gradually improving.  I, the attending physician,  performed the HPI with the patient and updated documentation appropriately.        Comments   75 y/o male pt here for 4 wk f/u for PVD OD.  Feels VA OD may be slightly worse.  No change in [place] OS.  Denies pain, floaters, but did see some intermittent FOL at night about 2 wks ago, but can't be sure which eye they were in.  AT prn OU.      Last edited by Rennis Chris, MD on 01/11/2021 11:29 PM.    Pt states his floaters are still there  Referring physician: Steele Sizer, MD [address]  [place], [place] [address]  HISTORICAL INFORMATION:   Selected notes from the MEDICAL RECORD NUMBER Referred by Dr. Delynn Flavin LEE: 08.11.22 Ocular Hx- possible tear OD    CURRENT MEDICATIONS: No current outpatient medications on file. (Ophthalmic Drugs)   No current facility-administered medications for this visit. (Ophthalmic Drugs)   Current Outpatient Medications (Other)  Medication Sig   acetaminophen (TYLENOL) 650 MG CR tablet Take 1,300 mg daily as needed by mouth for pain.    aspirin 81 MG tablet Take 81 mg by mouth daily.   atorvastatin (LIPITOR) 80 MG tablet Take 1 tablet (80 mg total) by mouth daily.   CALCIUM-MAGNESIUM-ZINC PO Take 1 capsule by mouth daily.   diltiazem (CARDIZEM SR) 120 MG 12 hr capsule Take 1 capsule (120 mg total) by mouth daily.   docusate sodium (COLACE) 100 MG capsule Take 100 mg by mouth 2 (two) times daily.   famotidine (PEPCID) 20 MG tablet TAKE ONE TABLET BY MOUTH ONCE DAILY AT BEDTIME   Fluticasone-Umeclidin-Vilant  (TRELEGY ELLIPTA) 100-62.5-25 MCG/INH AEPB USE 1 INHALATION BY MOUTH  DAILY   furosemide (LASIX) 20 MG tablet Take 1 tablet (20 mg total) by mouth daily.   gabapentin (NEURONTIN) 300 MG capsule Take 300 mg by mouth 2 (two) times daily.   glimepiride (AMARYL) 1 MG tablet Take 1 tablet by mouth daily.   guaiFENesin (MUCINEX) 600 MG 12 hr tablet Take 1 tablet (600 mg total) by mouth 2 (two) times daily.   ipratropium-albuterol (DUONEB) 0.5-2.5 (3) MG/3ML SOLN Inhale 3 mLs into the lungs every 6 (six) hours as needed (wheezing or shortness of breath).   isosorbide mononitrate (IMDUR) 30 MG 24 hr tablet Take 1 tablet (30 mg total) by mouth daily.   levocetirizine (XYZAL) 5 MG tablet Take 5 mg at bedtime by mouth.    montelukast (SINGULAIR) 10 MG tablet Take 10 mg at bedtime by mouth.    Multiple Vitamin (MULTIVITAMIN WITH MINERALS) TABS tablet Take 1 tablet by mouth daily.   nitroGLYCERIN (NITROSTAT) 0.4 MG SL tablet Place 1 tablet (0.4 mg total) under the tongue every 5 (five) minutes as needed for chest pain.   olmesartan (BENICAR) 20 MG tablet Take 20 mg daily by mouth.   pantoprazole (PROTONIX) 40 MG tablet TAKE ONE TABLET BY MOUTH ONCE DAILY TAKE  30-60  MINUTES  BEFORE  FIRST  MEAL  OF  THE  DAY   predniSONE (DELTASONE) 10 MG tablet 1 every other day   PROAIR HFA 108 (90 BASE) MCG/ACT inhaler Inhale 2 puffs into the lungs 4 (four) times daily as needed.   Respiratory Therapy Supplies (FLUTTER) DEVI Blow through 4 times per set, 3 sets per day when needed to clear lungs   No current facility-administered medications for this visit. (Other)      REVIEW OF SYSTEMS: ROS   Positive for: Gastrointestinal, Musculoskeletal, Cardiovascular, Eyes, Respiratory Negative for: Constitutional, Neurological, Skin, Genitourinary, HENT, Endocrine, Psychiatric, Allergic/Imm, Heme/Lymph Last edited by Celine Mans, COA on 01/11/2021  2:34 PM.        ALLERGIES Allergies  Allergen Reactions    Penicillins Anaphylaxis    Has patient had a PCN reaction causing immediate rash, facial/tongue/throat swelling, SOB or lightheadedness with hypotension: Yes Has patient had a PCN reaction causing severe rash involving mucus membranes or skin necrosis: No Has patient had a PCN reaction that required hospitalization: Yes Has patient had a PCN reaction occurring within the last 10 years: No If all of the above answers are "NO", then may proceed with Cephalosporin use.    Lopressor [Metoprolol Tartrate]     " It drops my heart rate."   Codeine Rash    PAST MEDICAL HISTORY Past Medical History:  Diagnosis Date   Arthritis    Bradycardia    pacemaker - Medtronic Adapta #[id number] - December 22, 2005   Carpal tunnel syndrome on both sides    Carpal tunnel syndrome, bilateral    Chronic back pain    Chronic kidney disease    COPD (chronic obstructive pulmonary disease) (HCC)    Depression    Diabetes mellitus    type II controled with diet   GERD (gastroesophageal reflux disease)    Headache(784.0)    Hyperlipidemia    Hypertension    Hypertensive retinopathy    Insomnia    Neuromuscular disorder (HCC)    peripheral neuropathy BLE   Pneumonia    Presence of permanent cardiac pacemaker    Wears glasses    Past Surgical History:  Procedure Laterality Date   BACK SURGERY     CARPAL TUNNEL RELEASE Right 03/24/2017   Procedure: Right CARPAL TUNNEL RELEASE;  Surgeon: Maeola Harman, MD;  Location: [facility] OR;  Service: Neurosurgery;  Laterality: Right;  Right CARPAL TUNNEL RELEASE   CATARACT EXTRACTION     EP IMPLANTABLE DEVICE N/A 06/27/2015   Procedure:  PPM Generator Changeout;  Surgeon: Duke Salvia, MD;  Location: [facility] INVASIVE CV LAB;  Service: Cardiovascular;  Laterality: N/A;   EYE SURGERY     HEMORRHOID SURGERY     INSERT / REPLACE / REMOVE PACEMAKER     MULTIPLE TOOTH EXTRACTIONS     PACEMAKER INSERTION  ?2007   TOTAL HIP ARTHROPLASTY Right 01/25/2013   Procedure: TOTAL HIP  ARTHROPLASTY ANTERIOR APPROACH;  Surgeon: Velna Ochs, MD;  Location: [facility] OR;  Service: Orthopedics;  Laterality: Right;   ULNAR NERVE TRANSPOSITION Right 03/24/2017   Procedure: Right Ulnar nerve release;  Surgeon: Maeola Harman, MD;  Location: [facility] OR;  Service: Neurosurgery;  Laterality: Right;  Right Ulnar nerve release    FAMILY HISTORY Family History  Problem Relation Age of Onset   Heart disease Mother    Asthma Maternal Aunt    Allergies Other        whole family    SOCIAL  HISTORY Social History   Tobacco Use   Smoking status: Former    Packs/day: 2.00    Years: 15.00    Pack years: 30.00    Types: Cigarettes    Start date: 05/05/1958    Quit date: 05/05/1973    Years since quitting: 47.7   Smokeless tobacco: Never  Vaping Use   Vaping Use: Never used  Substance Use Topics   Alcohol use: No    Alcohol/week: 0.0 standard drinks   Drug use: No         OPHTHALMIC EXAM:  Base Eye Exam     Visual Acuity (Snellen - Linear)       Right Left   Dist cc 20/20 -2 20/20 -2    Correction: Glasses         Tonometry (Tonopen, 2:37 PM)       Right Left   Pressure 19 20         Pupils       Dark Light Shape React APD   Right 4 3 Round Brisk None   Left 4 3 Round Brisk None         Visual Fields (Counting fingers)       Left Right    Full Full         Extraocular Movement       Right Left    Full, Ortho Full, Ortho         Neuro/Psych     Oriented x3: Yes   Mood/Affect: Normal         Dilation     Both eyes: 1.0% Mydriacyl, 2.5% Phenylephrine @ 2:37 PM           Slit Lamp and Fundus Exam     External Exam       Right Left   External Normal Normal         Slit Lamp Exam       Right Left   Lids/Lashes Dermatochalasis - upper lid Dermatochalasis - upper lid   Conjunctiva/Sclera temporal pinguecula   Mild temporal pinguecula    Cornea arcus, Punctate epithelial erosions Arcus, Punctate epithelial erosions, Debris in  tear film, Well healed temporal cataract wound   Anterior Chamber Deep and quiet Deep and quiet   Iris Round and dilated Round and dilated   Lens PC IOL in good position PC IOL in good position   Vitreous Vitreous syneresis, no pigment, Posterior vitreous detachment, prominent white vitreous condensations inferiorly  Vitreous syneresis         Fundus Exam       Right Left   Disc Pink and sharp, Compact, mild PPA/PPP Pink and sharp, Compact   C/D Ratio 0.3 0.2   Macula Flat, good foveal reflex, RPE mottling, No edema, No heme Flat, good foveal reflex, mild RPE Mottling, no edema, no heme   Vessels Mild Vascular attenuation Mild vascular attenuation, Tortuous, AV crossing changes, Copper wiring   Periphery Attached, mild reticular degeneration, mild peripheral drusen, No RT/RD Attached, mild reticular degeneration, mild peripheral drusen, No RT/RD            IMAGING AND PROCEDURES  Imaging and Procedures for 01/11/2021  OCT, Retina - OU - Both Eyes       Right Eye Quality was good. Central Foveal Thickness: 297. Progression has been stable. Findings include normal foveal contour, no IRF, no SRF.   Left Eye Quality was good. Central Foveal Thickness: 300. Progression has been stable. Findings include  normal foveal contour, no IRF, no SRF.   Notes *Images captured and stored on drive  Diagnosis / Impression:  NFP; no IRF/SRF OU  Clinical management:  See below  Abbreviations: NFP - Normal foveal profile. CME - cystoid macular edema. PED - pigment epithelial detachment. IRF - intraretinal fluid. SRF - subretinal fluid. EZ - ellipsoid zone. ERM - epiretinal membrane. ORA - outer retinal atrophy. ORT - outer retinal tubulation. SRHM - subretinal hyper-reflective material. IRHM - intraretinal hyper-reflective material               ASSESSMENT/PLAN:    ICD-10-CM   1. Posterior vitreous detachment of right eye  H43.811     2. Retinal edema  H35.81 OCT, Retina - OU -  Both Eyes    3. Diabetes mellitus type 2 without retinopathy (HCC)  E11.9     4. Essential hypertension  I10     5. Hypertensive retinopathy of both eyes  H35.033     6. Pseudophakia of both eyes  Z96.1       1,2. PVD / vitreous syneresis OD  - persistent floaters OD--slightly improved; denies photopsias  - Discussed findings and prognosis  - No RT or RD on 360 peripheral exam  - Reviewed s/s of RT/RD  - Strict return precautions for any such RT/RD signs/symptoms - pt is cleared from a retina standpoint for release to primary eye care provider and resumption of primary eye care  - f/u here prn  3. Diabetes mellitus, type 2 without retinopathy - The incidence, risk factors for progression, natural history and treatment options for diabetic retinopathy  were discussed with patient.   - The need for close monitoring of blood glucose, blood pressure, and serum lipids, avoiding cigarette or any type of tobacco, and the need for long term follow up was also discussed with patient. - monitor  4,5. Hypertensive retinopathy OU - discussed importance of tight BP control - monitor   6. Pseudophakia OU  - s/p CE/IOL OU  - IOLs in good position, doing well  - monitor    Ophthalmic Meds Ordered this visit:  No orders of the defined types were placed in this encounter.      Return if symptoms worsen or fail to improve.  There are no Patient Instructions on file for this visit.   Explained the diagnoses, plan, and follow up with the patient and they expressed understanding.  Patient expressed understanding of the importance of proper follow up care.   This document serves as a record of services personally performed by Karie Chimera, MD, PhD. It was created on their behalf by Glee Arvin. Manson Passey, OA an ophthalmic technician. The creation of this record is the provider's dictation and/or activities during the visit.    Electronically signed by: Glee Arvin. Manson Passey, [place] 09.06.2022 11:30  PM   Karie Chimera, M.D., Ph.D. Diseases & Surgery of the Retina and Vitreous [facility]  I have reviewed the above documentation for accuracy and completeness, and I agree with the above. Karie Chimera, M.D., Ph.D. 01/11/21 11:40 PM   Abbreviations: M myopia (nearsighted); A astigmatism; H hyperopia (farsighted); P presbyopia; Mrx spectacle prescription;  CTL contact lenses; OD right eye; OS left eye; OU both eyes  XT exotropia; ET esotropia; PEK punctate epithelial keratitis; PEE punctate epithelial erosions; DES dry eye syndrome; MGD meibomian gland dysfunction; ATs artificial tears; PFAT's preservative free artificial tears; NSC nuclear sclerotic cataract; PSC posterior subcapsular cataract; ERM  epi-retinal membrane; PVD posterior vitreous detachment; RD retinal detachment; DM diabetes mellitus; DR diabetic retinopathy; NPDR non-proliferative diabetic retinopathy; PDR proliferative diabetic retinopathy; CSME clinically significant macular edema; DME diabetic macular edema; dbh dot blot hemorrhages; CWS cotton wool spot; POAG primary open angle glaucoma; C/D cup-to-disc ratio; HVF humphrey visual field; GVF goldmann visual field; OCT optical coherence tomography; IOP intraocular pressure; BRVO Branch retinal vein occlusion; CRVO central retinal vein occlusion; CRAO central retinal artery occlusion; BRAO branch retinal artery occlusion; RT retinal tear; SB scleral buckle; PPV pars plana vitrectomy; VH Vitreous hemorrhage; PRP panretinal laser photocoagulation; IVK intravitreal kenalog; VMT vitreomacular traction; MH Macular hole;  NVD neovascularization of the disc; NVE neovascularization elsewhere; AREDS age related eye disease study; ARMD age related macular degeneration; POAG primary open angle glaucoma; EBMD epithelial/anterior basement membrane dystrophy; ACIOL anterior chamber intraocular lens; IOL intraocular lens; PCIOL posterior chamber intraocular lens; Phaco/IOL  phacoemulsification with intraocular lens placement; PRK photorefractive keratectomy; LASIK laser assisted in situ keratomileusis; HTN hypertension; DM diabetes mellitus; COPD chronic obstructive pulmonary disease

## 2021-01-09 ENCOUNTER — Encounter: Payer: Self-pay | Admitting: Internal Medicine

## 2021-01-09 NOTE — Telephone Encounter (Signed)
No note needed 

## 2021-01-11 ENCOUNTER — Other Ambulatory Visit: Payer: Self-pay

## 2021-01-11 ENCOUNTER — Ambulatory Visit (INDEPENDENT_AMBULATORY_CARE_PROVIDER_SITE_OTHER): Payer: Medicare Other | Admitting: Ophthalmology

## 2021-01-11 ENCOUNTER — Encounter (INDEPENDENT_AMBULATORY_CARE_PROVIDER_SITE_OTHER): Payer: Self-pay | Admitting: Ophthalmology

## 2021-01-11 DIAGNOSIS — H3581 Retinal edema: Secondary | ICD-10-CM

## 2021-01-11 DIAGNOSIS — H35033 Hypertensive retinopathy, bilateral: Secondary | ICD-10-CM

## 2021-01-11 DIAGNOSIS — E119 Type 2 diabetes mellitus without complications: Secondary | ICD-10-CM

## 2021-01-11 DIAGNOSIS — H43811 Vitreous degeneration, right eye: Secondary | ICD-10-CM

## 2021-01-11 DIAGNOSIS — I10 Essential (primary) hypertension: Secondary | ICD-10-CM | POA: Diagnosis not present

## 2021-01-11 DIAGNOSIS — Z961 Presence of intraocular lens: Secondary | ICD-10-CM

## 2021-01-14 ENCOUNTER — Telehealth (HOSPITAL_COMMUNITY): Payer: Self-pay | Admitting: Internal Medicine

## 2021-01-14 NOTE — Telephone Encounter (Signed)
Patient cancelled echocardiogram for the reason below:  01/14/2021 2:19 PM SM:OLMBEM, [name] [name]  Cancel Rsn: Patient (cannot afford the cost)  Order will be removed from the Echo Wq. If patient calls back to reschedule we will reinstate the order or create a new order. Thank you.

## 2021-01-21 ENCOUNTER — Other Ambulatory Visit (HOSPITAL_COMMUNITY): Payer: Medicare Other

## 2021-01-22 ENCOUNTER — Telehealth: Payer: Self-pay

## 2021-01-22 NOTE — Telephone Encounter (Signed)
The patient wanted to get his device check on the same day as his echo. I looked with [facility] and there is nothing available on 02/01/2021

## 2021-01-30 ENCOUNTER — Ambulatory Visit (INDEPENDENT_AMBULATORY_CARE_PROVIDER_SITE_OTHER): Payer: Medicare Other

## 2021-01-30 ENCOUNTER — Other Ambulatory Visit (HOSPITAL_COMMUNITY): Payer: Medicare Other

## 2021-01-30 ENCOUNTER — Other Ambulatory Visit: Payer: Self-pay

## 2021-01-30 DIAGNOSIS — I495 Sick sinus syndrome: Secondary | ICD-10-CM

## 2021-01-30 LAB — CUP PACEART INCLINIC DEVICE CHECK
Battery Impedance: 377 Ohm
Battery Remaining Longevity: 96 mo
Battery Voltage: 2.78 V
Brady Statistic AP VP Percent: 2 %
Brady Statistic AP VS Percent: 89 %
Brady Statistic AS VP Percent: 0 %
Brady Statistic AS VS Percent: 9 %
Date Time Interrogation Session: 20220928150448
Implantable Lead Implant Date: 20070820
Implantable Lead Implant Date: 20070820
Implantable Lead Location: 753859
Implantable Lead Location: 753860
Implantable Lead Model: 4092
Implantable Lead Model: 5076
Implantable Pulse Generator Implant Date: 20170222
Lead Channel Impedance Value: 447 Ohm
Lead Channel Impedance Value: 596 Ohm
Lead Channel Pacing Threshold Amplitude: 1.25 V
Lead Channel Pacing Threshold Amplitude: 1.25 V
Lead Channel Pacing Threshold Pulse Width: 0.4 ms
Lead Channel Pacing Threshold Pulse Width: 0.4 ms
Lead Channel Sensing Intrinsic Amplitude: 2.8 mV
Lead Channel Sensing Intrinsic Amplitude: 2.8 mV
Lead Channel Setting Pacing Amplitude: 2 V
Lead Channel Setting Pacing Amplitude: 2.5 V
Lead Channel Setting Pacing Pulse Width: 0.4 ms
Lead Channel Setting Sensing Sensitivity: 1.4 mV

## 2021-01-30 NOTE — Progress Notes (Signed)
Pacemaker check in clinic. Normal device function. Thresholds, sensing, impedances consistent with previous measurements. Device programmed to maximize longevity. No mode switches.  21 VHR, EGM's appear NSVT, longest 29 beats, unable to see termination.  Device programmed at appropriate safety margins. Histogram distribution appropriate for patient activity level. Device programmed to optimize intrinsic conduction. Estimated longevity 8 years. Patient declines remote monitoring. Next in-clinic check will be at 1 yr apt. with SK. Patient education completed.

## 2021-02-01 ENCOUNTER — Other Ambulatory Visit: Payer: Self-pay

## 2021-02-01 ENCOUNTER — Ambulatory Visit (HOSPITAL_COMMUNITY): Payer: Medicare Other | Attending: Cardiology

## 2021-02-01 DIAGNOSIS — I5032 Chronic diastolic (congestive) heart failure: Secondary | ICD-10-CM | POA: Diagnosis not present

## 2021-02-01 LAB — ECHOCARDIOGRAM COMPLETE
Area-P 1/2: 3.65 cm2
S' Lateral: 3.7 cm

## 2021-02-01 MED ORDER — PERFLUTREN LIPID MICROSPHERE
1.0000 mL | INTRAVENOUS | Status: AC | PRN
  Administered 2021-02-01: 3 mL via INTRAVENOUS

## 2021-02-20 DIAGNOSIS — M1611 Unilateral primary osteoarthritis, right hip: Secondary | ICD-10-CM | POA: Diagnosis not present

## 2021-02-20 DIAGNOSIS — K219 Gastro-esophageal reflux disease without esophagitis: Secondary | ICD-10-CM | POA: Diagnosis not present

## 2021-02-20 DIAGNOSIS — E7800 Pure hypercholesterolemia, unspecified: Secondary | ICD-10-CM | POA: Diagnosis not present

## 2021-02-20 DIAGNOSIS — N183 Chronic kidney disease, stage 3 unspecified: Secondary | ICD-10-CM | POA: Diagnosis not present

## 2021-02-20 DIAGNOSIS — J449 Chronic obstructive pulmonary disease, unspecified: Secondary | ICD-10-CM | POA: Diagnosis not present

## 2021-02-20 DIAGNOSIS — H35033 Hypertensive retinopathy, bilateral: Secondary | ICD-10-CM | POA: Diagnosis not present

## 2021-02-20 DIAGNOSIS — J411 Mucopurulent chronic bronchitis: Secondary | ICD-10-CM | POA: Diagnosis not present

## 2021-02-20 DIAGNOSIS — E1142 Type 2 diabetes mellitus with diabetic polyneuropathy: Secondary | ICD-10-CM | POA: Diagnosis not present

## 2021-02-20 DIAGNOSIS — I10 Essential (primary) hypertension: Secondary | ICD-10-CM | POA: Diagnosis not present

## 2021-02-20 DIAGNOSIS — J441 Chronic obstructive pulmonary disease with (acute) exacerbation: Secondary | ICD-10-CM | POA: Diagnosis not present

## 2021-02-22 ENCOUNTER — Other Ambulatory Visit: Payer: Medicare Other | Admitting: *Deleted

## 2021-02-22 ENCOUNTER — Other Ambulatory Visit: Payer: Self-pay

## 2021-02-22 DIAGNOSIS — R079 Chest pain, unspecified: Secondary | ICD-10-CM | POA: Diagnosis not present

## 2021-02-22 LAB — HEPATIC FUNCTION PANEL
ALT: 16 IU/L (ref 0–44)
AST: 18 IU/L (ref 0–40)
Albumin: 4.8 g/dL — ABNORMAL HIGH (ref 3.7–4.7)
Alkaline Phosphatase: 79 IU/L (ref 44–121)
Bilirubin Total: 0.4 mg/dL (ref 0.0–1.2)
Bilirubin, Direct: 0.11 mg/dL (ref 0.00–0.40)
Total Protein: 7.3 g/dL (ref 6.0–8.5)

## 2021-02-22 LAB — LIPID PANEL
Chol/HDL Ratio: 3.8 ratio (ref 0.0–5.0)
Cholesterol, Total: 192 mg/dL (ref 100–199)
HDL: 50 mg/dL (ref >39)
LDL Chol Calc (NIH): 116 mg/dL — ABNORMAL HIGH (ref 0–99)
Triglycerides: 146 mg/dL (ref 0–149)
VLDL Cholesterol Cal: 26 mg/dL (ref 5–40)

## 2021-02-28 ENCOUNTER — Telehealth: Payer: Self-pay

## 2021-02-28 MED ORDER — EZETIMIBE 10 MG PO TABS: 10.0000 mg | ORAL_TABLET | Freq: Every day | ORAL | 3 refills | Status: DC

## 2021-02-28 NOTE — Telephone Encounter (Signed)
Pt has seen results and MD recommendations released through my chart.  Order placed for Zetia 10 mg PO QD.

## 2021-02-28 NOTE — Telephone Encounter (Signed)
-----   Message from Christell Constant, MD sent at 02/22/2021  7:59 PM EDT ----- Results: LDL above goal for CAD Plan: Offer Zetia 10 mg PO daily and re-eval at out next visit  Christell Constant, MD

## 2021-03-04 DIAGNOSIS — E7800 Pure hypercholesterolemia, unspecified: Secondary | ICD-10-CM | POA: Diagnosis not present

## 2021-03-04 DIAGNOSIS — J449 Chronic obstructive pulmonary disease, unspecified: Secondary | ICD-10-CM | POA: Diagnosis not present

## 2021-03-04 DIAGNOSIS — K219 Gastro-esophageal reflux disease without esophagitis: Secondary | ICD-10-CM | POA: Diagnosis not present

## 2021-03-04 DIAGNOSIS — N183 Chronic kidney disease, stage 3 unspecified: Secondary | ICD-10-CM | POA: Diagnosis not present

## 2021-03-04 DIAGNOSIS — E1142 Type 2 diabetes mellitus with diabetic polyneuropathy: Secondary | ICD-10-CM | POA: Diagnosis not present

## 2021-03-04 DIAGNOSIS — Z23 Encounter for immunization: Secondary | ICD-10-CM | POA: Diagnosis not present

## 2021-03-04 DIAGNOSIS — Z1211 Encounter for screening for malignant neoplasm of colon: Secondary | ICD-10-CM | POA: Diagnosis not present

## 2021-03-04 DIAGNOSIS — I10 Essential (primary) hypertension: Secondary | ICD-10-CM | POA: Diagnosis not present

## 2021-03-06 DIAGNOSIS — Z1211 Encounter for screening for malignant neoplasm of colon: Secondary | ICD-10-CM | POA: Diagnosis not present

## 2021-03-12 ENCOUNTER — Encounter (INDEPENDENT_AMBULATORY_CARE_PROVIDER_SITE_OTHER): Payer: Medicare Other | Admitting: Ophthalmology

## 2021-03-12 ENCOUNTER — Other Ambulatory Visit: Payer: Self-pay

## 2021-03-12 DIAGNOSIS — H43813 Vitreous degeneration, bilateral: Secondary | ICD-10-CM | POA: Diagnosis not present

## 2021-03-12 DIAGNOSIS — I10 Essential (primary) hypertension: Secondary | ICD-10-CM | POA: Diagnosis not present

## 2021-03-12 DIAGNOSIS — E113293 Type 2 diabetes mellitus with mild nonproliferative diabetic retinopathy without macular edema, bilateral: Secondary | ICD-10-CM | POA: Diagnosis not present

## 2021-03-12 DIAGNOSIS — H35033 Hypertensive retinopathy, bilateral: Secondary | ICD-10-CM

## 2021-03-12 NOTE — Progress Notes (Signed)
Cardiology Office Note:    Date:  03/15/2021   ID:  [name] [name], DOB December 27, 1945, MRN [id number]  PCP:  Shirline Frees, MD  [facility] Cardiologist:  Rudean Haskell MD [place] Electrophysiologist:  Virl Axe, MD   CC:  follow up CAD  History of Present Illness:    [name] [name] is a 75 y.o. male with a hx of DM with HTN, HLD, Non obstructive CAD, COPD, SSS s/p Medtronic PPM, AT seen by Dr. Caryl Comes, CKD. Who presents for evaluation.  Seem by NP Phylliss Bob 01/2019 and received CCTA for CP. In interim of his visit 05/08/20, patient diuresed 15 lbs with improvement in his SOB.  Saw Dr. Caryl Comes, but has leg swelling:  Stopped his thiazide diuretic.  Questions about volumes status, breathing, and CP lead to re-evaluation. Last seen 06/22/20.  In interim of this visit, patient plans for surgery.  11/22/20.  Eye surgery is next Wednesday 11/28/20. In interim of this visit, patient had normal Stress (LV reported low EF) and subsequent normal EF by echo.  See 03/15/21.  Patient notes that he is doing pretty good.   Since last visit notes no changes outsides of cardiac testing and eye surgery. There are no interval hospital/ED visit.   Eye surgery was done with no issues.  No angina- does have small second nerve that has gone on since his negative stress test.  Notes that he still have some DOE when he tries to overdo it- like when he went out to blow leaves last weekend.  Our records show a 4 lbs weight gain but this may not be accurate- he feels his legs and weight are about the same since last visit.  Has no orthopnea but he has a couple 2 X 6 board that raise his bed slightly that helps with his breathing.   Past Medical History:  Diagnosis Date   Arthritis    Bradycardia    pacemaker - Medtronic Adapta #[id number] - December 22, 2005   Carpal tunnel syndrome on both sides    Carpal tunnel syndrome, bilateral    Chronic back pain    Chronic kidney disease    COPD (chronic obstructive  pulmonary disease) ([place])    Depression    Diabetes mellitus    type II controled with diet   GERD (gastroesophageal reflux disease)    Headache(784.0)    Hyperlipidemia    Hypertension    Hypertensive retinopathy    Insomnia    Neuromuscular disorder ([place])    peripheral neuropathy BLE   Pneumonia    Presence of permanent cardiac pacemaker    Wears glasses     Past Surgical History:  Procedure Laterality Date   BACK SURGERY     CARPAL TUNNEL RELEASE Right 03/24/2017   Procedure: Right CARPAL TUNNEL RELEASE;  Surgeon: Erline Levine, MD;  Location: [place];  Service: Neurosurgery;  Laterality: Right;  Right CARPAL TUNNEL RELEASE   CATARACT EXTRACTION     EP IMPLANTABLE DEVICE N/A 06/27/2015   Procedure:  PPM Generator Changeout;  Surgeon: Deboraha Sprang, MD;  Location: [place] [facility];  Service: Cardiovascular;  Laterality: N/A;   EYE SURGERY     HEMORRHOID SURGERY     INSERT / REPLACE / REMOVE PACEMAKER     MULTIPLE TOOTH EXTRACTIONS     PACEMAKER INSERTION  ?2007   TOTAL HIP ARTHROPLASTY Right 01/25/2013   Procedure: TOTAL HIP ARTHROPLASTY ANTERIOR APPROACH;  Surgeon: Hessie Dibble, MD;  Location: [place];  Service: Orthopedics;  Laterality: Right;   ULNAR NERVE TRANSPOSITION Right 03/24/2017   Procedure: Right Ulnar nerve release;  Surgeon: Erline Levine, MD;  Location: [place];  Service: Neurosurgery;  Laterality: Right;  Right Ulnar nerve release    Current Medications: Current Meds  Medication Sig   acetaminophen (TYLENOL) 650 MG CR tablet Take 1,300 mg daily as needed by mouth for pain.    aspirin 81 MG tablet Take 81 mg by mouth daily.   atorvastatin (LIPITOR) 80 MG tablet Take 1 tablet (80 mg total) by mouth daily.   CALCIUM-MAGNESIUM-ZINC PO Take 1 capsule by mouth daily.   diltiazem (CARDIZEM SR) 120 MG 12 hr capsule Take 1 capsule (120 mg total) by mouth daily.   docusate sodium (COLACE) 100 MG capsule Take 100 mg by mouth 2 (two) times daily.   ezetimibe (ZETIA)  10 MG tablet Take 1 tablet (10 mg total) by mouth daily.   famotidine (PEPCID) 20 MG tablet TAKE ONE TABLET BY MOUTH ONCE DAILY AT BEDTIME   Fluticasone-Umeclidin-Vilant (TRELEGY ELLIPTA) 100-62.5-25 MCG/INH AEPB USE 1 INHALATION BY MOUTH  DAILY   furosemide (LASIX) 20 MG tablet Take 1 tablet (20 mg total) by mouth daily.   gabapentin (NEURONTIN) 300 MG capsule Take 300 mg by mouth 2 (two) times daily.   glimepiride (AMARYL) 1 MG tablet Take 1 tablet by mouth daily.   guaiFENesin (MUCINEX) 600 MG 12 hr tablet Take 1 tablet (600 mg total) by mouth 2 (two) times daily.   ipratropium-albuterol (DUONEB) 0.5-2.5 (3) MG/3ML SOLN Inhale 3 mLs into the lungs every 6 (six) hours as needed (wheezing or shortness of breath).   isosorbide mononitrate (IMDUR) 30 MG 24 hr tablet Take 1 tablet (30 mg total) by mouth daily.   levocetirizine (XYZAL) 5 MG tablet Take 5 mg at bedtime by mouth.    montelukast (SINGULAIR) 10 MG tablet Take 10 mg at bedtime by mouth.    Multiple Vitamin (MULTIVITAMIN WITH MINERALS) TABS tablet Take 1 tablet by mouth daily.   olmesartan (BENICAR) 20 MG tablet Take 20 mg daily by mouth.   pantoprazole (PROTONIX) 40 MG tablet TAKE ONE TABLET BY MOUTH ONCE DAILY TAKE  30-60  MINUTES  BEFORE  FIRST  MEAL  OF  THE  DAY   predniSONE (DELTASONE) 10 MG tablet 1 every other day   PROAIR HFA 108 (90 BASE) MCG/ACT inhaler Inhale 2 puffs into the lungs 4 (four) times daily as needed.   Respiratory Therapy Supplies (FLUTTER) DEVI Blow through 4 times per set, 3 sets per day when needed to clear lungs     Allergies:   Penicillins, Lopressor [metoprolol tartrate], and Codeine   Social History   Socioeconomic History   Marital status: Legally Separated    Spouse name: Not on file   Number of children: 2   Years of education: `   Highest education level: Not on file  Occupational History   Occupation: Retired  Tobacco Use   Smoking status: Former    Packs/day: 2.00    Years: 15.00     Pack years: 30.00    Types: Cigarettes    Start date: 05/05/1958    Quit date: 05/05/1973    Years since quitting: 47.8   Smokeless tobacco: Never  Vaping Use   Vaping Use: Never used  Substance and Sexual Activity   Alcohol use: No    Alcohol/week: 0.0 standard drinks   Drug use: No   Sexual activity: Not on file  Other Topics  Concern   Not on file  Social History Narrative   Not on file   Social Determinants of Health   Financial Resource Strain: Not on file  Food Insecurity: Not on file  Transportation Needs: Not on file  Physical Activity: Not on file  Stress: Not on file  Social Connections: Not on file     Family History: The patient's family history includes Allergies in an other family member; Asthma in his maternal aunt; Heart disease in his mother.  ROS:   Please see the history of present illness.    All other systems reviewed and are negative.  EKGs/Labs/Other Studies Reviewed:    The following studies were reviewed today:  EKG:  05/08/20: Possible sinus rhythm (low amplitude P wave) 1st HB, iRBBB 04/24/20: SR with 1st HB rate 77 with PACs and PVCs  Transthoracic Echocardiogram: Date:02/01/21 Results:  Study Conclusions   1. Left ventricular ejection fraction, by estimation, is 50 to 55%. The  left ventricle has low normal function. The left ventricle has no regional  wall motion abnormalities. There is moderate asymmetric left ventricular  hypertrophy of the basal-septal  segment. Left ventricular diastolic parameters were normal.   2. Right ventricular systolic function is normal. The right ventricular  size is normal. Tricuspid regurgitation signal is inadequate for assessing  PA pressure.   3. The mitral valve is normal in structure. Trivial mitral valve  regurgitation. No evidence of mitral stenosis.   4. The aortic valve is tricuspid. Aortic valve regurgitation is not  visualized. Mild aortic valve sclerosis is present, with no evidence of  aortic  valve stenosis.   5. Aortic dilatation noted. There is mild dilatation of the ascending  aorta, measuring 38 mm.   Cardiac CT: Date: 03/01/2019 Results: Aortic Atherosclerosis, Left Dominant System, LM CAC Left main is a large artery that gives rise to LAD and LCX arteries. Minimal (<25%) calcified plaque.   LAD is a large vessel. There is minimal (<25%) mixed plaque in the ostium and mild (25-49%) mixed plaque proximally. There is a short segment of myocardial bridging distally. There is a large, branching D1 with minimal calcified plaque proximally and mild calcified plaque at the bifurcation. D2 and D3 are small and without significant stenosis.   LCX is a large, dominant artery that gives rise to one large OM1 branch. There is scattered minimal calcified plaque. There is a small OM1, medium OM2, large OM3 and small OM4 without evidence of significant disease.  NM Stress Testing: Date: 12/14/20 Results: The left ventricular ejection fraction is mildly decreased (45-54%). Nuclear stress EF: 40%. There was no ST segment deviation noted during stress. Defect 1: There is a medium defect of moderate severity present in the apex location. This is an intermediate risk study.   No evidence of ischemia. There is a fixed apical defect, with normal wall motion in this area, suggestive of artifact. Overall low cardiac counts with increased extracardiac activity. Mildly reduced EF without focal wall motion abnormalities   Recent Labs: 04/24/2020: B Natriuretic Peptide 43.6; Hemoglobin 13.6; Platelets 276 06/29/2020: BUN 18; Creatinine, Ser 1.17; Magnesium 1.9; NT-Pro BNP 153; Potassium 3.7; Sodium 140 02/22/2021: ALT 16  Recent Lipid Panel    Component Value Date/Time   CHOL 192 02/22/2021 0826   TRIG 146 02/22/2021 0826   HDL 50 02/22/2021 0826   CHOLHDL 3.8 02/22/2021 0826   LDLCALC 116 (H) 02/22/2021 0826   Physical Exam:    VS:  BP 128/74   Pulse 68  Ht 6' (1.829 m)   Wt  224 lb (101.6 kg)   SpO2 97%   BMI 30.38 kg/m     Wt Readings from Last 3 Encounters:  03/15/21 224 lb (101.6 kg)  01/03/21 220 lb 3.2 oz (99.9 kg)  12/14/20 220 lb (99.8 kg)    Gen: No distress, well nourished Neck: No JVD Cardiac: No Rubs or Gallops, No Murmur, normal rhythm and +2 radial pulses Respiratory: Clear to auscultation bilaterally, normal effort, normal  respiratory rate GI: Soft, nontender, non-distended  MS: trace edema in hands and legs bilaterally edema;  moves all extremities Integument: Skin feels warm Neuro:  At time of evaluation, alert and oriented to person/place/time/situation  Psych: Normal affect, patient feels the best he has since we've met   ASSESSMENT:    No diagnosis found.  PLAN:     Heart Failure Preserved Ejection Fraction  COPD- seeing PCCM Frequent PVCs- seen by Dr. Caryl Comes PPM per Dr. Caryl Comes DM with HTN - NYHA class II, Stage B, slightly hypervolemic - Diuretic regimen: lasix 20 mg PO Daily - re-discussed fluid, salt, and weigh restrictions:  if his home weights go up, he has return of SOB or his leg swelling increases, we will increase the lasix to 40 mg PO daily and add SGLT2i - we discussed both addition of MRA and SLGT2i- patient would like to get off medications and is not amenable to adding more unless absolutely necessary: he is compensated and we will defer  Coronary Artery Disease; Nonobstructive HLD Aortic Atherosclerosis - asymptomatic  - anatomy: minimal non obstructive disease in 01/2019 - continue ASA 81 mg; - goal LDL < 70 ; on Zetia 10 and  atorvastatin 80 mg LDL improved but above goal; patient not presently amenable to new medications for heart attack prevention  - continue diltiazem (BB held in the setting of COPD) - Imdur 30 mg Po Daily    Will plan for 6 month follow up unless new symptoms or abnormal test results warranting change in plan  Would be reasonable for  APP Follow up    Medication Adjustments/Labs  and Tests Ordered: Current medicines are reviewed at length with the patient today.  Concerns regarding medicines are outlined above.  No orders of the defined types were placed in this encounter.  No orders of the defined types were placed in this encounter.   There are no Patient Instructions on file for this visit.   Signed, Werner Lean, MD  03/15/2021 8:12 AM    [place]

## 2021-03-15 ENCOUNTER — Other Ambulatory Visit: Payer: Self-pay

## 2021-03-15 ENCOUNTER — Ambulatory Visit (INDEPENDENT_AMBULATORY_CARE_PROVIDER_SITE_OTHER): Payer: Medicare Other | Admitting: Internal Medicine

## 2021-03-15 ENCOUNTER — Encounter: Payer: Self-pay | Admitting: Internal Medicine

## 2021-03-15 VITALS — BP 128/74 | HR 68 | Ht 72.0 in | Wt 224.0 lb

## 2021-03-15 DIAGNOSIS — I5032 Chronic diastolic (congestive) heart failure: Secondary | ICD-10-CM | POA: Diagnosis not present

## 2021-03-15 DIAGNOSIS — I25118 Atherosclerotic heart disease of native coronary artery with other forms of angina pectoris: Secondary | ICD-10-CM

## 2021-03-15 DIAGNOSIS — E782 Mixed hyperlipidemia: Secondary | ICD-10-CM

## 2021-03-15 DIAGNOSIS — I700 Atherosclerosis of aorta: Secondary | ICD-10-CM | POA: Diagnosis not present

## 2021-03-15 DIAGNOSIS — G4733 Obstructive sleep apnea (adult) (pediatric): Secondary | ICD-10-CM | POA: Diagnosis not present

## 2021-03-15 NOTE — Patient Instructions (Signed)
Medication Instructions:  Your physician recommends that you continue on your current medications as directed. Please refer to the Current Medication list given to you today.  *If you need a refill on your cardiac medications before your next appointment, please call your pharmacy*   Lab Work: NONE If you have labs (blood work) drawn today and your tests are completely normal, you will receive your results only by: MyChart Message (if you have MyChart) OR A paper copy in the mail If you have any lab test that is abnormal or we need to change your treatment, we will call you to review the results.   Testing/Procedures: NONE   Follow-Up: At [facility], you and your health needs are our priority.  As part of our continuing mission to provide you with exceptional heart care, we have created designated Provider Care Teams.  These Care Teams include your primary Cardiologist (physician) and Advanced Practice Providers (APPs -  Physician Assistants and Nurse Practitioners) who all work together to provide you with the care you need, when you need it.  We recommend signing up for the patient portal called "MyChart".  Sign up information is provided on this After Visit Summary.  MyChart is used to connect with patients for Virtual Visits (Telemedicine).  Patients are able to view lab/test results, encounter notes, upcoming appointments, etc.  Non-urgent messages can be sent to your provider as well.   To learn more about what you can do with MyChart, go to [email or web address].    Your next appointment:   6 month(s)  The format for your next appointment:   In Person  Provider:   Christell Constant, MD or APP If MD is not listed, click here to update    :1}

## 2021-03-26 ENCOUNTER — Other Ambulatory Visit: Payer: Self-pay | Admitting: Internal Medicine

## 2021-04-21 ENCOUNTER — Other Ambulatory Visit: Payer: Self-pay | Admitting: Internal Medicine

## 2021-04-21 DIAGNOSIS — I25118 Atherosclerotic heart disease of native coronary artery with other forms of angina pectoris: Secondary | ICD-10-CM

## 2021-06-11 DIAGNOSIS — J449 Chronic obstructive pulmonary disease, unspecified: Secondary | ICD-10-CM | POA: Diagnosis not present

## 2021-06-11 DIAGNOSIS — N183 Chronic kidney disease, stage 3 unspecified: Secondary | ICD-10-CM | POA: Diagnosis not present

## 2021-06-11 DIAGNOSIS — I10 Essential (primary) hypertension: Secondary | ICD-10-CM | POA: Diagnosis not present

## 2021-06-11 DIAGNOSIS — E1142 Type 2 diabetes mellitus with diabetic polyneuropathy: Secondary | ICD-10-CM | POA: Diagnosis not present

## 2021-06-11 DIAGNOSIS — K219 Gastro-esophageal reflux disease without esophagitis: Secondary | ICD-10-CM | POA: Diagnosis not present

## 2021-06-11 DIAGNOSIS — E7800 Pure hypercholesterolemia, unspecified: Secondary | ICD-10-CM | POA: Diagnosis not present

## 2021-06-28 ENCOUNTER — Other Ambulatory Visit: Payer: Self-pay | Admitting: Internal Medicine

## 2021-07-05 ENCOUNTER — Observation Stay (HOSPITAL_COMMUNITY)
Admission: EM | Admit: 2021-07-05 | Discharge: 2021-07-06 | Disposition: A | Discharge status: Home / Self Care | Payer: Medicare Other | Attending: Internal Medicine | Admitting: Internal Medicine

## 2021-07-05 ENCOUNTER — Emergency Department (HOSPITAL_COMMUNITY): Payer: Medicare Other

## 2021-07-05 ENCOUNTER — Other Ambulatory Visit: Payer: Self-pay

## 2021-07-05 DIAGNOSIS — A403 Sepsis due to Streptococcus pneumoniae: Secondary | ICD-10-CM | POA: Diagnosis not present

## 2021-07-05 DIAGNOSIS — Z87891 Personal history of nicotine dependence: Secondary | ICD-10-CM | POA: Insufficient documentation

## 2021-07-05 DIAGNOSIS — Z20822 Contact with and (suspected) exposure to covid-19: Secondary | ICD-10-CM | POA: Insufficient documentation

## 2021-07-05 DIAGNOSIS — Z79899 Other long term (current) drug therapy: Secondary | ICD-10-CM | POA: Diagnosis not present

## 2021-07-05 DIAGNOSIS — R059 Cough, unspecified: Secondary | ICD-10-CM | POA: Diagnosis not present

## 2021-07-05 DIAGNOSIS — Z7982 Long term (current) use of aspirin: Secondary | ICD-10-CM | POA: Diagnosis not present

## 2021-07-05 DIAGNOSIS — J449 Chronic obstructive pulmonary disease, unspecified: Secondary | ICD-10-CM | POA: Diagnosis not present

## 2021-07-05 DIAGNOSIS — N182 Chronic kidney disease, stage 2 (mild): Secondary | ICD-10-CM | POA: Diagnosis not present

## 2021-07-05 DIAGNOSIS — E1129 Type 2 diabetes mellitus with other diabetic kidney complication: Secondary | ICD-10-CM | POA: Insufficient documentation

## 2021-07-05 DIAGNOSIS — K219 Gastro-esophageal reflux disease without esophagitis: Secondary | ICD-10-CM | POA: Insufficient documentation

## 2021-07-05 DIAGNOSIS — E119 Type 2 diabetes mellitus without complications: Secondary | ICD-10-CM

## 2021-07-05 DIAGNOSIS — Z7901 Long term (current) use of anticoagulants: Secondary | ICD-10-CM | POA: Insufficient documentation

## 2021-07-05 DIAGNOSIS — Z7984 Long term (current) use of oral hypoglycemic drugs: Secondary | ICD-10-CM | POA: Insufficient documentation

## 2021-07-05 DIAGNOSIS — G4733 Obstructive sleep apnea (adult) (pediatric): Secondary | ICD-10-CM | POA: Insufficient documentation

## 2021-07-05 DIAGNOSIS — Z96641 Presence of right artificial hip joint: Secondary | ICD-10-CM | POA: Diagnosis not present

## 2021-07-05 DIAGNOSIS — R1110 Vomiting, unspecified: Secondary | ICD-10-CM | POA: Diagnosis not present

## 2021-07-05 DIAGNOSIS — R112 Nausea with vomiting, unspecified: Secondary | ICD-10-CM | POA: Diagnosis not present

## 2021-07-05 DIAGNOSIS — R6520 Severe sepsis without septic shock: Secondary | ICD-10-CM | POA: Diagnosis not present

## 2021-07-05 DIAGNOSIS — I129 Hypertensive chronic kidney disease with stage 1 through stage 4 chronic kidney disease, or unspecified chronic kidney disease: Secondary | ICD-10-CM | POA: Diagnosis not present

## 2021-07-05 DIAGNOSIS — R5381 Other malaise: Secondary | ICD-10-CM | POA: Diagnosis present

## 2021-07-05 DIAGNOSIS — I10 Essential (primary) hypertension: Secondary | ICD-10-CM | POA: Diagnosis not present

## 2021-07-05 DIAGNOSIS — R0602 Shortness of breath: Secondary | ICD-10-CM | POA: Diagnosis not present

## 2021-07-05 DIAGNOSIS — I517 Cardiomegaly: Secondary | ICD-10-CM | POA: Diagnosis not present

## 2021-07-05 DIAGNOSIS — G629 Polyneuropathy, unspecified: Secondary | ICD-10-CM

## 2021-07-05 DIAGNOSIS — G9009 Other idiopathic peripheral autonomic neuropathy: Secondary | ICD-10-CM | POA: Diagnosis not present

## 2021-07-05 DIAGNOSIS — E785 Hyperlipidemia, unspecified: Secondary | ICD-10-CM | POA: Diagnosis not present

## 2021-07-05 DIAGNOSIS — A419 Sepsis, unspecified organism: Secondary | ICD-10-CM

## 2021-07-05 DIAGNOSIS — N179 Acute kidney failure, unspecified: Secondary | ICD-10-CM | POA: Diagnosis not present

## 2021-07-05 DIAGNOSIS — J189 Pneumonia, unspecified organism: Secondary | ICD-10-CM | POA: Diagnosis not present

## 2021-07-05 DIAGNOSIS — R509 Fever, unspecified: Secondary | ICD-10-CM | POA: Diagnosis not present

## 2021-07-05 LAB — CBC WITH DIFFERENTIAL/PLATELET
Abs Immature Granulocytes: 0.06 10*3/uL (ref 0.00–0.07)
Basophils Absolute: 0 10*3/uL (ref 0.0–0.1)
Basophils Relative: 0 %
Eosinophils Absolute: 0.2 10*3/uL (ref 0.0–0.5)
Eosinophils Relative: 2 %
HCT: 46.3 % (ref 39.0–52.0)
Hemoglobin: 15.6 g/dL (ref 13.0–17.0)
Immature Granulocytes: 0 %
Lymphocytes Relative: 4 %
Lymphs Abs: 0.5 10*3/uL — ABNORMAL LOW (ref 0.7–4.0)
MCH: 31.9 pg (ref 26.0–34.0)
MCHC: 33.7 g/dL (ref 30.0–36.0)
MCV: 94.7 fL (ref 80.0–100.0)
Monocytes Absolute: 1.1 10*3/uL — ABNORMAL HIGH (ref 0.1–1.0)
Monocytes Relative: 8 %
Neutro Abs: 11.7 10*3/uL — ABNORMAL HIGH (ref 1.7–7.7)
Neutrophils Relative %: 86 %
Platelets: 194 10*3/uL (ref 150–400)
RBC: 4.89 MIL/uL (ref 4.22–5.81)
RDW: 13.2 % (ref 11.5–15.5)
WBC: 13.6 10*3/uL — ABNORMAL HIGH (ref 4.0–10.5)
nRBC: 0 % (ref 0.0–0.2)

## 2021-07-05 LAB — TROPONIN I (HIGH SENSITIVITY)
Troponin I (High Sensitivity): 10 ng/L (ref <18)
Troponin I (High Sensitivity): 10 ng/L (ref <18)

## 2021-07-05 LAB — URINALYSIS, ROUTINE W REFLEX MICROSCOPIC
Bilirubin Urine: NEGATIVE
Glucose, UA: NEGATIVE mg/dL
Hgb urine dipstick: NEGATIVE
Ketones, ur: 5 mg/dL — AB
Leukocytes,Ua: NEGATIVE
Nitrite: NEGATIVE
Protein, ur: NEGATIVE mg/dL
Specific Gravity, Urine: 1.019 (ref 1.005–1.030)
pH: 6 (ref 5.0–8.0)

## 2021-07-05 LAB — GLUCOSE, CAPILLARY
Glucose-Capillary: 139 mg/dL — ABNORMAL HIGH (ref 70–99)
Glucose-Capillary: 155 mg/dL — ABNORMAL HIGH (ref 70–99)

## 2021-07-05 LAB — CBG MONITORING, ED
Glucose-Capillary: 182 mg/dL — ABNORMAL HIGH (ref 70–99)
Glucose-Capillary: 111 mg/dL — ABNORMAL HIGH (ref 70–99)

## 2021-07-05 LAB — COMPREHENSIVE METABOLIC PANEL
ALT: 26 U/L (ref 0–44)
AST: 24 U/L (ref 15–41)
Albumin: 3.8 g/dL (ref 3.5–5.0)
Alkaline Phosphatase: 55 U/L (ref 38–126)
Anion gap: 10 (ref 5–15)
BUN: 23 mg/dL (ref 8–23)
CO2: 19 mmol/L — ABNORMAL LOW (ref 22–32)
Calcium: 8.3 mg/dL — ABNORMAL LOW (ref 8.9–10.3)
Chloride: 108 mmol/L (ref 98–111)
Creatinine, Ser: 1.29 mg/dL — ABNORMAL HIGH (ref 0.61–1.24)
GFR, Estimated: 57 mL/min — ABNORMAL LOW (ref >60)
Glucose, Bld: 197 mg/dL — ABNORMAL HIGH (ref 70–99)
Potassium: 4 mmol/L (ref 3.5–5.1)
Sodium: 137 mmol/L (ref 135–145)
Total Bilirubin: 0.8 mg/dL (ref 0.3–1.2)
Total Protein: 6.5 g/dL (ref 6.5–8.1)

## 2021-07-05 LAB — CBC
HCT: 46.3 % (ref 39.0–52.0)
Hemoglobin: 15.5 g/dL (ref 13.0–17.0)
MCH: 31.7 pg (ref 26.0–34.0)
MCHC: 33.5 g/dL (ref 30.0–36.0)
MCV: 94.7 fL (ref 80.0–100.0)
Platelets: 169 10*3/uL (ref 150–400)
RBC: 4.89 MIL/uL (ref 4.22–5.81)
RDW: 13.4 % (ref 11.5–15.5)
WBC: 11.6 10*3/uL — ABNORMAL HIGH (ref 4.0–10.5)
nRBC: 0 % (ref 0.0–0.2)

## 2021-07-05 LAB — HEMOGLOBIN A1C
Hgb A1c MFr Bld: 8.1 % — ABNORMAL HIGH (ref 4.8–5.6)
Mean Plasma Glucose: 185.77 mg/dL

## 2021-07-05 LAB — BASIC METABOLIC PANEL
Anion gap: 10 (ref 5–15)
BUN: 23 mg/dL (ref 8–23)
CO2: 24 mmol/L (ref 22–32)
Calcium: 8.6 mg/dL — ABNORMAL LOW (ref 8.9–10.3)
Chloride: 106 mmol/L (ref 98–111)
Creatinine, Ser: 1.27 mg/dL — ABNORMAL HIGH (ref 0.61–1.24)
GFR, Estimated: 58 mL/min — ABNORMAL LOW (ref >60)
Glucose, Bld: 205 mg/dL — ABNORMAL HIGH (ref 70–99)
Potassium: 3.8 mmol/L (ref 3.5–5.1)
Sodium: 140 mmol/L (ref 135–145)

## 2021-07-05 LAB — RESP PANEL BY RT-PCR (FLU A&B, COVID) ARPGX2
Influenza A by PCR: NEGATIVE
Influenza B by PCR: NEGATIVE
SARS Coronavirus 2 by RT PCR: NEGATIVE

## 2021-07-05 LAB — PROTIME-INR
INR: 1.1 (ref 0.8–1.2)
INR: 1.1 (ref 0.8–1.2)
Prothrombin Time: 14 seconds (ref 11.4–15.2)
Prothrombin Time: 13.9 seconds (ref 11.4–15.2)

## 2021-07-05 LAB — LACTIC ACID, PLASMA
Lactic Acid, Venous: 2.3 mmol/L (ref 0.5–1.9)
Lactic Acid, Venous: 2 mmol/L (ref 0.5–1.9)

## 2021-07-05 LAB — CORTISOL-AM, BLOOD: Cortisol - AM: 30.3 ug/dL — ABNORMAL HIGH (ref 6.7–22.6)

## 2021-07-05 LAB — PROCALCITONIN: Procalcitonin: 0.86 ng/mL

## 2021-07-05 LAB — APTT: aPTT: 25 seconds (ref 24–36)

## 2021-07-05 MED ORDER — ISOSORBIDE MONONITRATE ER 30 MG PO TB24
30.0000 mg | ORAL_TABLET | Freq: Every day | ORAL | Status: DC
  Administered 2021-07-05 – 2021-07-06 (×2): 30 mg via ORAL
  Filled 2021-07-05 (×2): qty 1

## 2021-07-05 MED ORDER — ACETAMINOPHEN 650 MG RE SUPP: 650.0000 mg | Freq: Four times a day (QID) | RECTAL | Status: DC | PRN

## 2021-07-05 MED ORDER — FENTANYL CITRATE PF 50 MCG/ML IJ SOSY
50.0000 ug | PREFILLED_SYRINGE | Freq: Once | INTRAMUSCULAR | Status: AC
  Administered 2021-07-05: 50 ug via INTRAVENOUS
  Filled 2021-07-05: qty 1

## 2021-07-05 MED ORDER — CALCIUM-MAGNESIUM-ZINC 333-133-5 MG PO TABS
ORAL_TABLET | Freq: Every day | ORAL | Status: DC
Start: 2021-07-05 — End: 2021-07-05

## 2021-07-05 MED ORDER — MONTELUKAST SODIUM 10 MG PO TABS
10.0000 mg | ORAL_TABLET | Freq: Every day | ORAL | Status: DC
  Administered 2021-07-05: 10 mg via ORAL
  Filled 2021-07-05 (×2): qty 1

## 2021-07-05 MED ORDER — DILTIAZEM HCL ER COATED BEADS 120 MG PO CP24
120.0000 mg | ORAL_CAPSULE | Freq: Two times a day (BID) | ORAL | Status: DC
  Administered 2021-07-05 – 2021-07-06 (×2): 120 mg via ORAL
  Filled 2021-07-05 (×3): qty 1

## 2021-07-05 MED ORDER — ACETAMINOPHEN 325 MG PO TABS
650.0000 mg | ORAL_TABLET | Freq: Four times a day (QID) | ORAL | Status: DC | PRN
Start: 2021-07-05 — End: 2021-07-06

## 2021-07-05 MED ORDER — FAMOTIDINE 20 MG PO TABS
20.0000 mg | ORAL_TABLET | Freq: Every day | ORAL | Status: DC
Start: 2021-07-05 — End: 2021-07-06
  Administered 2021-07-05 – 2021-07-06 (×2): 20 mg via ORAL
  Filled 2021-07-05 (×2): qty 1

## 2021-07-05 MED ORDER — NITROGLYCERIN 0.4 MG SL SUBL: 0.4000 mg | SUBLINGUAL_TABLET | SUBLINGUAL | Status: DC | PRN

## 2021-07-05 MED ORDER — ENOXAPARIN SODIUM 40 MG/0.4ML IJ SOSY
40.0000 mg | PREFILLED_SYRINGE | INTRAMUSCULAR | Status: DC
  Administered 2021-07-05 – 2021-07-06 (×2): 40 mg via SUBCUTANEOUS
  Filled 2021-07-05 (×2): qty 0.4

## 2021-07-05 MED ORDER — FLUTICASONE FUROATE-VILANTEROL 100-25 MCG/ACT IN AEPB
1.0000 | INHALATION_SPRAY | Freq: Every day | RESPIRATORY_TRACT | Status: DC
  Administered 2021-07-06: 1 via RESPIRATORY_TRACT
  Filled 2021-07-05: qty 28

## 2021-07-05 MED ORDER — UMECLIDINIUM BROMIDE 62.5 MCG/ACT IN AEPB
1.0000 | INHALATION_SPRAY | Freq: Every day | RESPIRATORY_TRACT | Status: DC
  Administered 2021-07-06: 1 via RESPIRATORY_TRACT
  Filled 2021-07-05: qty 7

## 2021-07-05 MED ORDER — IPRATROPIUM-ALBUTEROL 0.5-2.5 (3) MG/3ML IN SOLN
3.0000 mL | RESPIRATORY_TRACT | Status: DC | PRN
  Administered 2021-07-06: 3 mL via RESPIRATORY_TRACT
  Filled 2021-07-05 (×2): qty 3

## 2021-07-05 MED ORDER — GLIMEPIRIDE 1 MG PO TABS
1.0000 mg | ORAL_TABLET | Freq: Every day | ORAL | Status: DC
  Filled 2021-07-05: qty 1

## 2021-07-05 MED ORDER — LEVOFLOXACIN IN D5W 750 MG/150ML IV SOLN: 750.0000 mg | INTRAVENOUS | Status: DC

## 2021-07-05 MED ORDER — LEVOFLOXACIN IN D5W 750 MG/150ML IV SOLN: 750.0000 mg | Freq: Once | INTRAVENOUS | Status: DC

## 2021-07-05 MED ORDER — IPRATROPIUM BROMIDE 0.02 % IN SOLN
0.5000 mg | Freq: Once | RESPIRATORY_TRACT | Status: AC
  Administered 2021-07-05: 0.5 mg via RESPIRATORY_TRACT
  Filled 2021-07-05: qty 2.5

## 2021-07-05 MED ORDER — DILTIAZEM HCL ER 60 MG PO CP12
120.0000 mg | ORAL_CAPSULE | Freq: Every day | ORAL | Status: DC
  Administered 2021-07-05: 120 mg via ORAL
  Filled 2021-07-05: qty 2

## 2021-07-05 MED ORDER — IRBESARTAN 75 MG PO TABS
37.5000 mg | ORAL_TABLET | Freq: Every day | ORAL | Status: DC
Start: 2021-07-05 — End: 2021-07-06
  Administered 2021-07-05 – 2021-07-06 (×2): 37.5 mg via ORAL
  Filled 2021-07-05: qty 0.5
  Filled 2021-07-05: qty 1

## 2021-07-05 MED ORDER — ONDANSETRON HCL 4 MG PO TABS: 4.0000 mg | ORAL_TABLET | Freq: Four times a day (QID) | ORAL | Status: DC | PRN

## 2021-07-05 MED ORDER — INSULIN ASPART 100 UNIT/ML IJ SOLN
0.0000 [IU] | Freq: Three times a day (TID) | INTRAMUSCULAR | Status: DC
  Administered 2021-07-05: 2 [IU] via SUBCUTANEOUS
  Administered 2021-07-05 (×2): 3 [IU] via SUBCUTANEOUS
  Administered 2021-07-06: 2 [IU] via SUBCUTANEOUS

## 2021-07-05 MED ORDER — ASPIRIN EC 81 MG PO TBEC
81.0000 mg | DELAYED_RELEASE_TABLET | Freq: Every day | ORAL | Status: DC
  Administered 2021-07-05 – 2021-07-06 (×2): 81 mg via ORAL
  Filled 2021-07-05 (×2): qty 1

## 2021-07-05 MED ORDER — FUROSEMIDE 20 MG PO TABS
20.0000 mg | ORAL_TABLET | Freq: Every day | ORAL | Status: DC
Start: 2021-07-05 — End: 2021-07-06
  Administered 2021-07-05 – 2021-07-06 (×2): 20 mg via ORAL
  Filled 2021-07-05 (×2): qty 1

## 2021-07-05 MED ORDER — LEVOCETIRIZINE DIHYDROCHLORIDE 5 MG PO TABS: 5.0000 mg | ORAL_TABLET | Freq: Every day | ORAL | Status: DC

## 2021-07-05 MED ORDER — ATORVASTATIN CALCIUM 80 MG PO TABS
80.0000 mg | ORAL_TABLET | Freq: Every day | ORAL | Status: DC
  Administered 2021-07-05 – 2021-07-06 (×2): 80 mg via ORAL
  Filled 2021-07-05: qty 2
  Filled 2021-07-05: qty 1

## 2021-07-05 MED ORDER — LEVOFLOXACIN IN D5W 750 MG/150ML IV SOLN
750.0000 mg | Freq: Once | INTRAVENOUS | Status: AC
  Administered 2021-07-05: 750 mg via INTRAVENOUS
  Filled 2021-07-05: qty 150

## 2021-07-05 MED ORDER — ONDANSETRON HCL 4 MG/2ML IJ SOLN
4.0000 mg | Freq: Four times a day (QID) | INTRAMUSCULAR | Status: DC | PRN
Start: 2021-07-05 — End: 2021-07-06

## 2021-07-05 MED ORDER — GABAPENTIN 300 MG PO CAPS
300.0000 mg | ORAL_CAPSULE | Freq: Two times a day (BID) | ORAL | Status: DC
  Administered 2021-07-05 – 2021-07-06 (×3): 300 mg via ORAL
  Filled 2021-07-05 (×3): qty 1

## 2021-07-05 MED ORDER — PREDNISONE 10 MG PO TABS
10.0000 mg | ORAL_TABLET | ORAL | Status: DC
  Administered 2021-07-05: 10 mg via ORAL
  Filled 2021-07-05: qty 1

## 2021-07-05 MED ORDER — ADULT MULTIVITAMIN W/MINERALS CH
1.0000 | ORAL_TABLET | Freq: Every day | ORAL | Status: DC
  Administered 2021-07-05 – 2021-07-06 (×2): 1 via ORAL
  Filled 2021-07-05 (×2): qty 1

## 2021-07-05 MED ORDER — EZETIMIBE 10 MG PO TABS
10.0000 mg | ORAL_TABLET | Freq: Every day | ORAL | Status: DC
  Administered 2021-07-05 – 2021-07-06 (×2): 10 mg via ORAL
  Filled 2021-07-05 (×2): qty 1

## 2021-07-05 MED ORDER — MAGNESIUM HYDROXIDE 400 MG/5ML PO SUSP
30.0000 mL | Freq: Every day | ORAL | Status: DC | PRN
  Filled 2021-07-05: qty 30

## 2021-07-05 MED ORDER — LORATADINE 10 MG PO TABS
10.0000 mg | ORAL_TABLET | Freq: Every day | ORAL | Status: DC
  Administered 2021-07-05: 10 mg via ORAL
  Filled 2021-07-05: qty 1

## 2021-07-05 MED ORDER — PANTOPRAZOLE SODIUM 40 MG PO TBEC
40.0000 mg | DELAYED_RELEASE_TABLET | Freq: Every day | ORAL | Status: DC
  Administered 2021-07-05 – 2021-07-06 (×2): 40 mg via ORAL
  Filled 2021-07-05 (×2): qty 1

## 2021-07-05 MED ORDER — TRAZODONE HCL 50 MG PO TABS
25.0000 mg | ORAL_TABLET | Freq: Every evening | ORAL | Status: DC | PRN
  Filled 2021-07-05: qty 1

## 2021-07-05 MED ORDER — SODIUM CHLORIDE 0.9 % IV BOLUS (SEPSIS)
1000.0000 mL | Freq: Once | INTRAVENOUS | Status: AC
Start: 2021-07-05 — End: 2021-07-05
  Administered 2021-07-05: 1000 mL via INTRAVENOUS

## 2021-07-05 MED ORDER — SODIUM CHLORIDE 0.9 % IV SOLN: INTRAVENOUS | Status: DC

## 2021-07-05 MED ORDER — GUAIFENESIN ER 600 MG PO TB12
600.0000 mg | ORAL_TABLET | Freq: Two times a day (BID) | ORAL | Status: DC
  Administered 2021-07-05 – 2021-07-06 (×3): 600 mg via ORAL
  Filled 2021-07-05 (×3): qty 1

## 2021-07-05 MED ORDER — LEVALBUTEROL HCL 1.25 MG/0.5ML IN NEBU
1.2500 mg | INHALATION_SOLUTION | Freq: Once | RESPIRATORY_TRACT | Status: AC
  Administered 2021-07-05: 1.25 mg via RESPIRATORY_TRACT
  Filled 2021-07-05: qty 0.5

## 2021-07-05 MED ORDER — SODIUM CHLORIDE 0.9 % IV SOLN
1000.0000 mL | INTRAVENOUS | Status: DC
  Administered 2021-07-06: 1000 mL via INTRAVENOUS

## 2021-07-05 MED ORDER — DOCUSATE SODIUM 100 MG PO CAPS
100.0000 mg | ORAL_CAPSULE | Freq: Two times a day (BID) | ORAL | Status: DC
  Administered 2021-07-05 – 2021-07-06 (×2): 100 mg via ORAL
  Filled 2021-07-05 (×3): qty 1

## 2021-07-05 MED ORDER — LEVOFLOXACIN IN D5W 750 MG/150ML IV SOLN
750.0000 mg | INTRAVENOUS | Status: DC
  Administered 2021-07-06: 750 mg via INTRAVENOUS
  Filled 2021-07-05: qty 150

## 2021-07-05 NOTE — Assessment & Plan Note (Addendum)
Baseline creatinine 1.2.  On admission, creatinine up to 1.29.  Slightly increased bump likely due to sepsis as above.  Creatinine improved to 1.16 at time of discharge. ?

## 2021-07-05 NOTE — Assessment & Plan Note (Addendum)
Patient presenting to the ED with fever, cough, weakness and was found to have a temperature of 102.7, tachypnea, leukocytosis with WBC count of 13.7 with lactic acid of 2.3 and slight increase in creatinine.  Chest x-ray findings of left lower lobe infiltrate.  Urinalysis unrevealing.  Elevated procalcitonin 0.86.  Patient was started on IV Levaquin and initially required supplemental oxygen which was titrated off.  Patient has remained afebrile for greater than 24 hours and will continue Levaquin 500 mg p.o. daily to complete 5-day course.  Patient was seen by PT/OT and no desaturations noted on ambulatory O2 screening.  Continue incentive spirometry, flutter valve on discharge.  Follow-up with PCP in 1 week. ?

## 2021-07-05 NOTE — ED Triage Notes (Signed)
Pt brought to ED by [facility] via wheelchair with c/o fever, cough and chills. EMS reports administering 1L bolus of NS and Zofran 4mg  IVP prior to arrival. Endorses that pt actively vomited en route to facility.  ?

## 2021-07-05 NOTE — Progress Notes (Addendum)
PROGRESS NOTE    [name] [name]  [id number] DOB: [birth year] DOA: 07/05/2021 PCP: Shirline Frees, MD    Brief Narrative:  [name] [name] is a 76 year old male with past medical history significant for COPD, DM2, essential hypertension, HLD, peripheral neuropathy, OSA on nocturnal BiPAP who presented to [facility] ED on 3/3 via EMS with fever, cough and chills.  She also reports nausea with 1 episode of vomiting and diarrhea in the ED.  Denies chest pain, no palpitations, no dysuria, no urinary frequency or urgency.  In the ED, temperature 102.7 F, HR 96, RR 22, BP 137/84, SPO2 93% on 1 L nasal cannula.  WBC 13.6, hemoglobin 15.6, platelets 194.  Sodium 137, potassium 4.0, chloride 108, CO2 19, glucose 197, BUN 23, creatinine 1.29, AST 24, ALT 26, total bilirubin 0.8.  Lactic acid 2.3.  High sensitive troponin 10>10.  Procalcitonin 0.86.  COVID-19 PCR negative.  Influenza A/B PCR negative.  Urinalysis unrevealing.  Chest x-ray with patchy airspace disease left lung base concerning for infiltrate.  AG with NSR, rate 86, no concerning dynamic changes.  Patient was started on IV Levaquin, given Xopenex/Atrovent neb, fentanyl 50 mcg IV x1 and 1 L NS bolus.  EDP consulted [facility] for further evaluation management of sepsis secondary to pneumonia    Assessment & Plan:   Assessment and Plan: * Sepsis due to pneumonia [facility]) Patient presenting to the ED with fever, cough, weakness and was found to have a temperature of 102.7, tachypnea, leukocytosis with WBC count of 13.7 with lactic acid of 2.3 and slight increase in creatinine.  Chest x-ray findings of left lower lobe infiltrate.  Urinalysis unrevealing.  Elevated procalcitonin 0.86. --Blood cultures x2: Pending --Levaquin 750 mg IV q24h, plan 5-day course --Continue supplemental oxygen, maintain SPO2 >/= 88%, on 1L [place] (not O2 dependent) --Antipyretics, mucolytic's --Incentive spirometry, flutter valve -- Repeat CBC, lactic acid, procalcitonin in  a.m.  Dyslipidemia --Zetia 10 mg p.o. daily --Atorvastatin 80 mg p.o. daily  Essential hypertension --Continue home Cardizem 120 mg p.o. BID, Imdur 30 mg p.o. daily, furosemide 20 mg p.o. daily. --Continue aspirin and statin -- Closely monitor BP, hold BP meds if SBP less than 100    Type 2 diabetes mellitus without complications (HCC) Hemoglobin A1c 8.1, poorly controlled.  Home regimen includes glimepiride 1 mg p.o. daily. --Hold oral hypoglycemics while inpatient --SSI for coverage --CBGs qAC/HS   GERD without esophagitis --Continue home Protonix 40 mg p.o. daily, Pepcid 20 mg p.o. daily  COPD with chronic bronchitis ([place]) Follows with pulmonology, Dr. Annamaria Boots outpatient.  Currently on trilogy Ellipta 1 puff daily, prednisone 10 mg p.o. every other day outpatient.  Not oxygen dependent. --Continue hospital substitution of home inhaler with Breo Ellipta and Incruse Ellipta --Singulair 10 mg p.o. daily --Prednisone 10 mg p.o. every other day --Supplemental oxygen, maintain SPO2 greater than 88% (not on at baseline); attempt to wean off --O2 ambulatory screen tomorrow morning  OSA (obstructive sleep apnea) --Continue nocturnal BiPAP  CKD (chronic kidney disease) stage 2, GFR 60-89 ml/min Baseline creatinine 1.2.  On admission, creatinine up to 1.29.  Slightly increased bump likely due to sepsis as above. --Avoid nephrotoxins, renal dose all medications --Repeat BMP in a.m.  Physical deconditioning --PT/OT evaluation in the a.m.  Peripheral neuropathy --Gabapentin 300 mg p.o. twice daily    DVT prophylaxis: enoxaparin (LOVENOX) injection 40 mg Start: 07/05/21 1000    Code Status: Partial Code Family Communication: Updated patient's granddaughter present at bedside this morning  Disposition Plan:  Level of care: Telemetry Medical Status is: Inpatient Remains inpatient appropriate because: Continues require supplemental oxygen, IV antibiotics, will need amatory O2  screen and PT evaluation prior to discharge.   Consultants:  None  Procedures:  None  Antimicrobials:  Levaquin 3/3>>   Subjective: Patient seen examined bedside, resting comfortably.  Remains in ED holding area.  Granddaughter present.  Continues with shortness of breath, cough, generalized weakness/fatigue.  Fever improved.  No other specific questions or concerns at this time.  Denies headache, no chest pain, palpitations, no chills/night sweats, no nausea/vomiting/diarrhea, no abdominal pain, no congestion, no paresthesias.  No acute events overnight per nursing staff.  Objective: Vitals:   07/05/21 0700 07/05/21 0745 07/05/21 1000 07/05/21 1331  BP: (!) 121/51  116/61 128/74  Pulse: (!) 57  65 73  Resp: (!) 29  (!) 23 (!) 25  Temp:  98.7 F (37.1 C)    TempSrc:  Oral    SpO2: 94%  92% 97%  Weight:      Height:       No intake or output data in the 24 hours ending 07/05/21 1447 Filed Weights   07/05/21 0400  Weight: 102.6 kg    Examination:  Physical Exam: GEN: NAD, alert and oriented x 3, elderly in appearance HEENT: NCAT, PERRL, EOMI, sclera clear, MMM PULM: Breath sounds slightly decreased left base with crackles, no wheezing, normal respiratory effort without accessory muscle use, on 1 L nasal cannula (not oxygen dependent at baseline) CV: RRR w/o M/G/R GI: abd soft, NTND, NABS, no R/G/M MSK: no peripheral edema, muscle strength globally intact 5/5 bilateral upper/lower extremities NEURO: CN II-XII intact, no focal deficits, sensation to light touch intact PSYCH: normal mood/affect Integumentary: dry/intact, no rashes or wounds    Data Reviewed: I have personally reviewed following labs and imaging studies  CBC: Recent Labs  Lab 07/05/21 0233 07/05/21 0420  WBC 13.6* 11.6*  NEUTROABS 11.7*  --   HGB 15.6 15.5  HCT 46.3 46.3  MCV 94.7 94.7  PLT 194 [id number]   Basic Metabolic Panel: Recent Labs  Lab 07/05/21 0233 07/05/21 0420  NA 137 140  K 4.0  3.8  CL 108 106  CO2 19* 24  GLUCOSE 197* 205*  BUN 23 23  CREATININE 1.29* 1.27*  CALCIUM 8.3* 8.6*   GFR: Estimated Creatinine Clearance: 60.4 mL/min (A) (by C-G formula based on SCr of 1.27 mg/dL (H)). Liver Function Tests: Recent Labs  Lab 07/05/21 0233  AST 24  ALT 26  ALKPHOS 55  BILITOT 0.8  PROT 6.5  ALBUMIN 3.8   No results for input(s): LIPASE, AMYLASE in the last 168 hours. No results for input(s): AMMONIA in the last 168 hours. Coagulation Profile: Recent Labs  Lab 07/05/21 0233 07/05/21 0420  INR 1.1 1.1   Cardiac Enzymes: No results for input(s): CKTOTAL, CKMB, CKMBINDEX, TROPONINI in the last 168 hours. BNP (last 3 results) No results for input(s): PROBNP in the last 8760 hours. HbA1C: Recent Labs    07/05/21 0233  HGBA1C 8.1*   CBG: Recent Labs  Lab 07/05/21 0746 07/05/21 1251  GLUCAP 182* 111*   Lipid Profile: No results for input(s): CHOL, HDL, LDLCALC, TRIG, CHOLHDL, LDLDIRECT in the last 72 hours. Thyroid Function Tests: No results for input(s): TSH, T4TOTAL, FREET4, T3FREE, THYROIDAB in the last 72 hours. Anemia Panel: No results for input(s): VITAMINB12, FOLATE, FERRITIN, TIBC, IRON, RETICCTPCT in the last 72 hours. Sepsis Labs: Recent Labs  Lab 07/05/21 0233 07/05/21 0416 07/05/21  0420  PROCALCITON  --   --  0.86  LATICACIDVEN 2.3* 2.0*  --     Recent Results (from the past 240 hour(s))  Resp Panel by RT-PCR (Flu A&B, Covid) Nasopharyngeal Swab     Status: None   Collection Time: 07/05/21  2:48 AM   Specimen: Nasopharyngeal Swab; Nasopharyngeal(NP) swabs in vial transport medium  Result Value Ref Range Status   SARS Coronavirus 2 by RT PCR NEGATIVE NEGATIVE Final    Comment: (NOTE) SARS-CoV-2 target nucleic acids are NOT DETECTED.  The SARS-CoV-2 RNA is generally detectable in upper respiratory specimens during the acute phase of infection. The lowest concentration of SARS-CoV-2 viral copies this assay can detect is 138  copies/mL. A negative result does not preclude SARS-Cov-2 infection and should not be used as the sole basis for treatment or other patient management decisions. A negative result may occur with  improper specimen collection/handling, submission of specimen other than nasopharyngeal swab, presence of viral mutation(s) within the areas targeted by this assay, and inadequate number of viral copies(<138 copies/mL). A negative result must be combined with clinical observations, patient history, and epidemiological information. The expected result is Negative.  Fact Sheet for Patients:  EntrepreneurPulse.com.au  Fact Sheet for Healthcare Providers:  IncredibleEmployment.be  This test is no t yet approved or cleared by the [place] FDA and  has been authorized for detection and/or diagnosis of SARS-CoV-2 by FDA under an Emergency Use Authorization (EUA). This EUA will remain  in effect (meaning this test can be used) for the duration of the COVID-19 declaration under Section 564(b)(1) of the Act, 21 U.S.C.section 360bbb-3(b)(1), unless the authorization is terminated  or revoked sooner.       Influenza A by PCR NEGATIVE NEGATIVE Final   Influenza B by PCR NEGATIVE NEGATIVE Final    Comment: (NOTE) The Xpert Xpress SARS-CoV-2/FLU/RSV plus assay is intended as an aid in the diagnosis of influenza from Nasopharyngeal swab specimens and should not be used as a sole basis for treatment. Nasal washings and aspirates are unacceptable for Xpert Xpress SARS-CoV-2/FLU/RSV testing.  Fact Sheet for Patients: EntrepreneurPulse.com.au  Fact Sheet for Healthcare Providers: IncredibleEmployment.be  This test is not yet approved or cleared by the [place] FDA and has been authorized for detection and/or diagnosis of SARS-CoV-2 by FDA under an Emergency Use Authorization (EUA). This EUA will remain in effect (meaning  this test can be used) for the duration of the COVID-19 declaration under Section 564(b)(1) of the Act, 21 U.S.C. section 360bbb-3(b)(1), unless the authorization is terminated or revoked.  Performed at [facility], [place] [address], [place],  [address]          Radiology Studies: DG Chest Port 1 View  Result Date: 07/05/2021 CLINICAL DATA:  Possible sepsis. Fever, cough, chills, and vomiting. EXAM: PORTABLE CHEST 1 VIEW COMPARISON:  04/24/2020. 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 The mediastinal contour is within normal limits. Lung volumes are low resulting in vascular crowding. Patchy airspace disease is present at the left lung base. No definite effusion or pneumothorax. No acute osseous abnormality. IMPRESSION: Patchy airspace disease at the left lung base, possible atelectasis or infiltrate. Electronically Signed   By: Brett Fairy M.D.   On: 07/05/2021 03:32        Scheduled Meds:  aspirin EC  81 mg Oral Daily   atorvastatin  80 mg Oral Daily   diltiazem  120 mg Oral  BID   docusate sodium  100 mg Oral BID   enoxaparin (LOVENOX) injection  40 mg Subcutaneous Q24H   ezetimibe  10 mg Oral Daily   famotidine  20 mg Oral Q breakfast   fluticasone furoate-vilanterol  1 puff Inhalation Daily   And   umeclidinium bromide  1 puff Inhalation Daily   furosemide  20 mg Oral Daily   gabapentin  300 mg Oral BID   guaiFENesin  600 mg Oral BID   insulin aspart  0-15 Units Subcutaneous TID AC & HS   irbesartan  37.5 mg Oral Daily   isosorbide mononitrate  30 mg Oral Daily   loratadine  10 mg Oral QHS   montelukast  10 mg Oral QHS   multivitamin with minerals  1 tablet Oral Daily   pantoprazole  40 mg Oral Daily   predniSONE  10 mg Oral QODAY   Continuous Infusions:  sodium chloride     [START ON 07/06/2021] levofloxacin (LEVAQUIN) IV       LOS: 0 days    Time spent: 42 minutes spent on chart review, discussion  with nursing staff, consultants, updating family and interview/physical exam; more than 50% of that time was spent in counseling and/or coordination of care.    Becker Christopher J [place] DO [facility] Available via Epic secure chat 7am-7pm After these hours, please refer to coverage provider listed on [email or web address] 07/05/2021, 2:47 PM

## 2021-07-05 NOTE — Assessment & Plan Note (Addendum)
Continue home Zetia 10 mg p.o. daily, Atorvastatin 80 mg p.o. daily ?

## 2021-07-05 NOTE — Assessment & Plan Note (Signed)
-   The patient will be admitted to a medical telemetry bed. ?- We will continue antibiotic therapy with IV Levaquin. ?- Mucolytic therapy will be provided. ?- DuoNebs will be given 4 times daily every 4 hours as needed. ?- We will follow blood cultures. ?

## 2021-07-05 NOTE — Sepsis Progress Note (Signed)
Monitoring for the code sepsis protocol. °

## 2021-07-05 NOTE — ED Provider Notes (Signed)
[facility] EMERGENCY DEPARTMENT Provider Note  CSN: [id number] Arrival date & time: 07/05/21 0153  Chief Complaint(s) Fever  HPI [name] [name] is a 76 y.o. male     Fever Max temp prior to arrival:  100.4 Temp source:  Oral Severity:  Moderate Onset quality:  Gradual Duration:  8 hours Timing:  Constant Progression:  Worsening Chronicity:  New Associated symptoms: chills, cough, diarrhea, myalgias, nausea and vomiting   Associated symptoms: no dysuria and no headaches   Risk factors: no contaminated food, no immunosuppression, no recent sickness, no recent travel and no sick contacts    Past Medical History Past Medical History:  Diagnosis Date   Arthritis    Bradycardia    pacemaker - Medtronic Adapta #[id number] - December 22, 2005   Carpal tunnel syndrome on both sides    Carpal tunnel syndrome, bilateral    Chronic back pain    Chronic kidney disease    COPD (chronic obstructive pulmonary disease) (HCC)    Depression    Diabetes mellitus    type II controled with diet   GERD (gastroesophageal reflux disease)    Headache(784.0)    Hyperlipidemia    Hypertension    Hypertensive retinopathy    Insomnia    Neuromuscular disorder (HCC)    peripheral neuropathy BLE   Pneumonia    Presence of permanent cardiac pacemaker    Wears glasses    Patient Active Problem List   Diagnosis Date Noted   CAP (community acquired pneumonia) 07/05/2021   Chronic heart failure with preserved ejection fraction (HCC) 06/22/2020   Coronary artery disease of native artery of native heart with stable angina pectoris (HCC) 05/08/2020   Mixed hyperlipidemia 05/08/2020   Aortic atherosclerosis (HCC) 05/08/2020   Diabetes mellitus with coincident hypertension (HCC) 05/08/2020   Chest pain 01/27/2019   Peripheral arterial disease (HCC) 11/05/2018   OSA (obstructive sleep apnea) 07/30/2018   Chronic respiratory failure with hypoxia (HCC) 04/26/2018   GERD  (gastroesophageal reflux disease) 10/26/2017   Acute bronchiolitis 06/18/2017   Ulnar nerve compression 03/24/2017   COPD with chronic bronchitis (HCC) 12/15/2014   Upper airway cough syndrome 03/16/2014   DJD (degenerative joint disease) of hip 01/25/2013    Class: Chronic   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 Authorizing Provider  acetaminophen (TYLENOL) 650 MG CR tablet Take 1,300 mg daily as needed by mouth for pain.     [provider]  aspirin 81 MG tablet Take 81 mg by mouth daily.    [provider]  atorvastatin (LIPITOR) 80 MG tablet Take 1 tablet (80 mg total) by mouth daily. 11/22/20 03/15/21  Christell Constant, MD  CALCIUM-MAGNESIUM-ZINC PO Take 1 capsule by mouth daily.    [provider]  diltiazem (CARDIZEM SR) 120 MG 12 hr capsule Take 1 capsule by mouth once daily 06/28/21   Chandrasekhar, Mahesh A, MD  docusate sodium (COLACE) 100 MG capsule Take 100 mg by mouth 2 (two) times daily.    [provider]  ezetimibe (ZETIA) 10 MG tablet Take 1 tablet (10 mg total) by mouth daily. 02/28/21   Christell Constant, MD  famotidine (PEPCID) 20 MG tablet TAKE ONE TABLET BY MOUTH ONCE DAILY AT BEDTIME 06/07/14   Nyoka Cowden, MD  furosemide (  LASIX) 20 MG tablet Take 1 tablet by mouth once daily 04/22/21   Chandrasekhar, Mahesh A, MD  gabapentin (NEURONTIN) 300 MG capsule Take 300 mg by mouth 2 (two) times daily.    [provider]  glimepiride (AMARYL) 1 MG tablet Take 1 tablet by mouth daily. 07/27/14   [provider]  guaiFENesin (MUCINEX) 600 MG 12 hr tablet Take 1 tablet (600 mg total) by mouth 2 (two) times daily. 11/16/17   Jetty DuhamelYoung, Clinton D, MD  ipratropium-albuterol  (DUONEB) 0.5-2.5 (3) MG/3ML SOLN Inhale 3 mLs into the lungs every 6 (six) hours as needed (wheezing or shortness of breath). 01/03/21   Jetty DuhamelYoung, Clinton D, MD  isosorbide mononitrate (IMDUR) 30 MG 24 hr tablet Take 1 tablet by mouth once daily 04/22/21   Riley Lamhandrasekhar, Mahesh A, MD  levocetirizine (XYZAL) 5 MG tablet Take 5 mg at bedtime by mouth.  07/25/14   [provider]  montelukast (SINGULAIR) 10 MG tablet Take 10 mg at bedtime by mouth.  07/20/14   [provider]  Multiple Vitamin (MULTIVITAMIN WITH MINERALS) TABS tablet Take 1 tablet by mouth daily.    [provider]  nitroGLYCERIN (NITROSTAT) 0.4 MG SL tablet Place 1 tablet (0.4 mg total) under the tongue every 5 (five) minutes as needed for chest pain. 11/15/20 02/13/21  Christell Constanthandrasekhar, Mahesh A, MD  olmesartan (BENICAR) 20 MG tablet Take 20 mg daily by mouth.    [provider]  pantoprazole (PROTONIX) 40 MG tablet TAKE ONE TABLET BY MOUTH ONCE DAILY TAKE  30-60  MINUTES  BEFORE  FIRST  MEAL  OF  THE  DAY 06/07/14   Nyoka CowdenWert, Michael B, MD  predniSONE (DELTASONE) 10 MG tablet 1 every other day 11/30/20   Waymon BudgeYoung, Clinton D, MD  PROAIR HFA 108 (90 BASE) MCG/ACT inhaler Inhale 2 puffs into the lungs 4 (four) times daily as needed. 07/27/14   [provider]  Respiratory Therapy Supplies (FLUTTER) DEVI Blow through 4 times per set, 3 sets per day when needed to clear lungs 02/11/16   Waymon BudgeYoung, Clinton D, MD  TRELEGY ELLIPTA 100-62.5-25 MCG/ACT AEPB USE 1 INHALATION BY MOUTH  DAILY 03/27/21   Waymon BudgeYoung, Clinton D, MD                                                                                                                                    Allergies Penicillins, Lopressor [metoprolol tartrate], and Codeine  Review of Systems Review of Systems  Constitutional:  Positive for chills and fever.  Respiratory:  Positive for cough.   Gastrointestinal:  Positive for diarrhea, nausea and vomiting.  Genitourinary:   Negative for dysuria.  Musculoskeletal:  Positive for myalgias.  Neurological:  Negative for headaches.  As noted in HPI  Physical Exam Vital Signs  I have reviewed the triage vital signs BP 123/77    Pulse 88    Temp 100.1 F (37.8 C) (Oral)  Resp (!) 21    SpO2 97%   Physical Exam Vitals reviewed.  Constitutional:      General: He is not in acute distress.    Appearance: He is well-developed. He is not diaphoretic.  HENT:     Head: Normocephalic and atraumatic.     Nose: Nose normal.  Eyes:     General: No scleral icterus.       Right eye: No discharge.        Left eye: No discharge.     Conjunctiva/sclera: Conjunctivae normal.     Pupils: Pupils are equal, round, and reactive to light.  Cardiovascular:     Rate and Rhythm: Normal rate and regular rhythm.     Heart sounds: No murmur heard.   No friction rub. No gallop.  Pulmonary:     Effort: Pulmonary effort is normal. No respiratory distress.     Breath sounds: No stridor. Examination of the right-middle field reveals wheezing. Examination of the left-lower field reveals wheezing and rales. Wheezing and rales present. No rhonchi.  Abdominal:     General: There is no distension.     Palpations: Abdomen is soft.     Tenderness: There is no abdominal tenderness.  Musculoskeletal:        General: No tenderness.     Cervical back: Normal range of motion and neck supple.  Skin:    General: Skin is warm and dry.     Findings: No erythema or rash.  Neurological:     Mental Status: He is alert and oriented to person, place, and time.    ED Results and Treatments Labs (all labs ordered are listed, but only abnormal results are displayed) Labs Reviewed  LACTIC ACID, PLASMA - Abnormal; Notable for the following components:      Result Value   Lactic Acid, Venous 2.3 (*)    All other components within normal limits  COMPREHENSIVE METABOLIC PANEL - Abnormal; Notable for the following components:   CO2 19 (*)     Glucose, Bld 197 (*)    Creatinine, Ser 1.29 (*)    Calcium 8.3 (*)    GFR, Estimated 57 (*)    All other components within normal limits  CBC WITH DIFFERENTIAL/PLATELET - Abnormal; Notable for the following components:   WBC 13.6 (*)    Neutro Abs 11.7 (*)    Lymphs Abs 0.5 (*)    Monocytes Absolute 1.1 (*)    All other components within normal limits  URINALYSIS, ROUTINE W REFLEX MICROSCOPIC - Abnormal; Notable for the following components:   Ketones, ur 5 (*)    All other components within normal limits  RESP PANEL BY RT-PCR (FLU A&B, COVID) ARPGX2  CULTURE, BLOOD (ROUTINE X 2)  CULTURE, BLOOD (ROUTINE X 2)  URINE CULTURE  PROTIME-INR  APTT  LACTIC ACID, PLASMA  TROPONIN I (HIGH SENSITIVITY)  TROPONIN I (HIGH SENSITIVITY)  EKG  EKG Interpretation  Date/Time:  Friday July 05 2021 03:19:47 EST Ventricular Rate:  86 PR Interval:  209 QRS Duration: 109 QT Interval:  353 QTC Calculation: 423 R Axis:   -62 Text Interpretation: Sinus rhythm Inferior infarct, old Consider anterior infarct Confirmed by Drema Pry [phone]) on 07/05/2021 3:37:51 AM       Radiology DG Chest Port 1 View  Result Date: 07/05/2021 CLINICAL DATA:  Possible sepsis. Fever, cough, chills, and vomiting. EXAM: PORTABLE CHEST 1 VIEW COMPARISON:  04/24/2020. 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 The mediastinal contour is within normal limits. Lung volumes are low resulting in vascular crowding. Patchy airspace disease is present at the left lung base. No definite effusion or pneumothorax. No acute osseous abnormality. IMPRESSION: Patchy airspace disease at the left lung base, possible atelectasis or infiltrate. Electronically Signed   By: Thornell Sartorius M.D.   On: 07/05/2021 03:32    Pertinent labs & imaging results that were available  during my care of the patient were reviewed by me and considered in my medical decision making (see MDM for details).  Medications Ordered in ED Medications  sodium chloride 0.9 % bolus 1,000 mL (1,000 mLs Intravenous New Bag/Given 07/05/21 0401)    Followed by  0.9 %  sodium chloride infusion (has no administration in time range)  levofloxacin (LEVAQUIN) IVPB 750 mg (has no administration in time range)  levofloxacin (LEVAQUIN) IVPB 750 mg (has no administration in time range)  fentaNYL (SUBLIMAZE) injection 50 mcg (50 mcg Intravenous Given 07/05/21 0402)  levalbuterol (XOPENEX) nebulizer solution 1.25 mg (1.25 mg Nebulization Given 07/05/21 0405)  ipratropium (ATROVENT) nebulizer solution 0.5 mg (0.5 mg Nebulization Given 07/05/21 0405)                                                                                                                                     Procedures .Critical Care Performed by: Nira Conn, MD Authorized by: Nira Conn, MD   Critical care provider statement:    Critical care time (minutes):  45   Critical care time was exclusive of:  Separately billable procedures and treating other patients   Critical care was necessary to treat or prevent imminent or life-threatening deterioration of the following conditions:  Sepsis   Critical care was time spent personally by me on the following activities:  Development of treatment plan with patient or surrogate, discussions with consultants, evaluation of patient's response to treatment, examination of patient, obtaining history from patient or surrogate, review of old charts, re-evaluation of patient's condition, pulse oximetry, ordering and review of radiographic studies, ordering and review of laboratory studies and ordering and performing treatments and interventions   Care discussed with: admitting provider    (including critical care time)  Medical Decision Making / ED Course    Complexity of  Problem:  Co morbidities that  complicate the patient evaluation COPD on 10mg  prednisone every other day,sick sinus syndrome s/p pacemaker  Additional history obtained: Daughter  Patient's presenting problem/concern and DDX listed below: Fever Patient has respiratory and GI symptoms.  Will assess for pneumonia, UTI, viral process.  Abdomen is completely benign lower suspicion for intra-abdominal inflammatory/infectious process.     Complexity of Data:   Cardiac Monitoring: The patient was maintained on a cardiac monitor.   I personally viewed and interpreted the cardiac monitored which showed an underlying rhythm of normal sinus rhythm with rates in the 80s.  No dysrhythmias or blocks.  Laboratory Tests ordered listed below with my independent interpretation: CBC with leukocytosis and left shift.  No anemia No significant electrolyte derangements.  Mild renal insufficiency. Lactic acid elevated at 2.3. COVID/influenza negative. UA without evidence of infection.     Imaging Studies ordered listed below with my independent interpretation: Chest x-ray with possible left lower lobe pneumonia -consistent with clinical picture.     ED Course:    Hospitalization Considered:  Yes  Assessment, Intervention, and Reassessment: Sepsis 2/2 Left lower lobe pneumonia Code Sepsis initiated and patient started on empiric antibiotics - Levaquin due to severe PCN allergy Given IV fluids, but he does not require 30 cc/kg at this time. Noted to have wheezing as well and given xopenx/atrovent neb Spoke with Dr. from hospitalist service who agreed to admit patient for further management.   Final Clinical Impression(s) / ED Diagnoses Final diagnoses:  Sepsis with acute renal failure without septic shock, due to unspecified organism, unspecified acute renal failure type [facility])  Community acquired pneumonia of left lower lobe of lung  AKI (acute kidney injury) (HCC)           This  chart was dictated using voice recognition software.  Despite best efforts to proofread,  errors can occur which can change the documentation meaning.    [facility], MD 07/05/21 [phone]

## 2021-07-05 NOTE — Assessment & Plan Note (Addendum)
Continue home Protonix 40 mg p.o. daily, Pepcid 20 mg p.o. daily ?

## 2021-07-05 NOTE — Assessment & Plan Note (Addendum)
Continue nocturnal BiPAP 

## 2021-07-05 NOTE — Progress Notes (Signed)
Pharmacy Antibiotic Note ? ?[name] [name] is a 76 y.o. male admitted on 07/05/2021 with pneumonia.  Pharmacy has been consulted for levofloxacin dosing. ? ?Plan: ?Levofloxacin 750mg  IV Q24H x5d. ? ?Height: 6' (182.9 cm) ?Weight: 102.6 kg (226 lb 3.2 oz) ?IBW/kg (Calculated) : 77.6 ? ?Temp (24hrs), Avg:101.4 ?F (38.6 ?C), Min:100.1 ?F (37.8 ?C), Max:102.7 ?F (39.3 ?C) ? ?Recent Labs  ?Lab 07/05/21 ?0233  ?WBC 13.6*  ?CREATININE 1.29*  ?LATICACIDVEN 2.3*  ?  ?Estimated Creatinine Clearance: 59.4 mL/min (A) (by C-G formula based on SCr of 1.29 mg/dL (H)).   ? ?Allergies  ?Allergen Reactions  ? Penicillins Anaphylaxis  ?  Has patient had a PCN reaction causing immediate rash, facial/tongue/throat swelling, SOB or lightheadedness with hypotension: Yes ?Has patient had a PCN reaction causing severe rash involving mucus membranes or skin necrosis: No ?Has patient had a PCN reaction that required hospitalization: Yes ?Has patient had a PCN reaction occurring within the last 10 years: No ?If all of the above answers are "NO", then may proceed with Cephalosporin use. ?  ? Lopressor [Metoprolol Tartrate]   ?  " It drops my heart rate."  ? Codeine Rash  ? ? ?Thank you for allowing pharmacy to be a part of this patient?s care. ? ?Wynona Neat, PharmD, BCPS  ?07/05/2021 4:22 AM ? ?

## 2021-07-05 NOTE — Assessment & Plan Note (Addendum)
Follows with pulmonology, Dr. Maple Hudson outpatient.  Currently on trilogy Ellipta 1 puff daily, Singulair 10 mg p.o. daily, prednisone 10 mg p.o. every other day outpatient.  Not oxygen dependent.  No significant desaturations on ambulatory O2 screen at time of discharge.  Outpatient follow-up with pulmonology as scheduled. ?

## 2021-07-05 NOTE — Assessment & Plan Note (Addendum)
Seen by physical therapy and Occupational Therapy, no needs identified at time of discharge. ?

## 2021-07-05 NOTE — ED Notes (Signed)
LA 2.3 verbalized to [place], MD ?

## 2021-07-05 NOTE — H&P (Signed)
[place]   PATIENT NAME: Rodman Recupero    MR#:  [id number]  DATE OF BIRTH:  19-Jul-1945  DATE OF ADMISSION:  07/05/2021  PRIMARY CARE PHYSICIAN: [name], MD   Patient is coming from: Home.  REQUESTING/REFERRING PHYSICIAN: Cardama, Amadeo Garnet, MD   CHIEF COMPLAINT:   Chief Complaint  Patient presents with   Fever    HISTORY OF PRESENT ILLNESS:  JASKARAN DAUZAT is a 76 y.o. Caucasian male with medical history significant for COPD, type 2 diabetes mellitus, hypertension, dyslipidemia and peripheral neuropathy, who presented to the emergency room with concern of fever with associated chills as well as cough, nausea and vomiting 1 episode of diarrhea in the ER.  No chest pain or palpitations.  No dysuria, oliguria, urinary frequency or urgency or flank pain.   ED Course: Ray came to the ER temperature was 102.7 and respiratory rate of 22 and later temperature 100.1 with a pulse ox of 93% on 1 L of O2 by nasal cannula.  Lactic acid was 2.3 initially and CBC showed leukocytosis 13.6 with neutrophilia BMP revealed a BUN of 23 with a creatinine 1.29 and CO2 of 19 with a glucose of 197 and calcium 8.3.  LFTs were within normal.  UA was negative.  Post antigens and COVID-19 PCR came back negative.  Urine culture was sent.  EKG as reviewed by me : EKG showed normal sinus rhythm rate of 86 with inferior Q waves as well as inferior Q waves. Imaging: Portable chest ray showed patchy airspace disease in the left lung base possibly atelectasis or infiltrate.  The patient was given IV Levaquin 750 mg, Xopenex, Atrovent, 50 mcg of IV fentanyl and 1 L bolus of IV normal saline followed by 50 mill per hour.  He will be admitted to a medical telemetry bed for further evaluation and management. PAST MEDICAL HISTORY:   Past Medical History:  Diagnosis Date   Arthritis    Bradycardia    pacemaker - Medtronic Adapta #[id number] - December 22, 2005   Carpal tunnel syndrome on both sides     Carpal tunnel syndrome, bilateral    Chronic back pain    Chronic kidney disease    COPD (chronic obstructive pulmonary disease) (HCC)    Depression    Diabetes mellitus    type II controled with diet   GERD (gastroesophageal reflux disease)    Headache(784.0)    Hyperlipidemia    Hypertension    Hypertensive retinopathy    Insomnia    Neuromuscular disorder (HCC)    peripheral neuropathy BLE   Pneumonia    Presence of permanent cardiac pacemaker    Wears glasses     PAST SURGICAL HISTORY:   Past Surgical History:  Procedure Laterality Date   BACK SURGERY     CARPAL TUNNEL RELEASE Right 03/24/2017   Procedure: Right CARPAL TUNNEL RELEASE;  Surgeon: Maeola Harman, MD;  Location: [facility] OR;  Service: Neurosurgery;  Laterality: Right;  Right CARPAL TUNNEL RELEASE   CATARACT EXTRACTION     EP IMPLANTABLE DEVICE N/A 06/27/2015   Procedure:  PPM Generator Changeout;  Surgeon: Duke Salvia, MD;  Location: [facility] INVASIVE CV LAB;  Service: Cardiovascular;  Laterality: N/A;   EYE SURGERY     HEMORRHOID SURGERY     INSERT / REPLACE / REMOVE PACEMAKER     MULTIPLE TOOTH EXTRACTIONS     PACEMAKER INSERTION  ?2007   TOTAL HIP ARTHROPLASTY Right 01/25/2013   Procedure:  TOTAL HIP ARTHROPLASTY ANTERIOR APPROACH;  Surgeon: Velna Ochs, MD;  Location: [facility] OR;  Service: Orthopedics;  Laterality: Right;   ULNAR NERVE TRANSPOSITION Right 03/24/2017   Procedure: Right Ulnar nerve release;  Surgeon: Maeola Harman, MD;  Location: [facility] OR;  Service: Neurosurgery;  Laterality: Right;  Right Ulnar nerve release    SOCIAL HISTORY:   Social History   Tobacco Use   Smoking status: Former    Packs/day: 2.00    Years: 15.00    Pack years: 30.00    Types: Cigarettes    Start date: 05/05/1958    Quit date: 05/05/1973    Years since quitting: 48.2   Smokeless tobacco: Never  Substance Use Topics   Alcohol use: No    Alcohol/week: 0.0 standard drinks    FAMILY HISTORY:   Family History  Problem  Relation Age of Onset   Heart disease Mother    Asthma Maternal Aunt    Allergies Other        whole family    DRUG ALLERGIES:   Allergies  Allergen Reactions   Penicillins Anaphylaxis    Has patient had a PCN reaction causing immediate rash, facial/tongue/throat swelling, SOB or lightheadedness with hypotension: Yes Has patient had a PCN reaction causing severe rash involving mucus membranes or skin necrosis: No Has patient had a PCN reaction that required hospitalization: Yes Has patient had a PCN reaction occurring within the last 10 years: No If all of the above answers are "NO", then may proceed with Cephalosporin use.    Lopressor [Metoprolol Tartrate]     " It drops my heart rate."   Codeine Rash    REVIEW OF SYSTEMS:   ROS As per history of present illness. All pertinent systems were reviewed above. Constitutional, HEENT, cardiovascular, respiratory, GI, GU, musculoskeletal, neuro, psychiatric, endocrine, integumentary and hematologic systems were reviewed and are otherwise negative/unremarkable except for positive findings mentioned above in the HPI.   MEDICATIONS AT HOME:   Prior to Admission medications   Medication Sig Start Date End Date Taking? Authorizing Provider  acetaminophen (TYLENOL) 650 MG CR tablet Take 1,300 mg daily as needed by mouth for pain.     [provider]  aspirin 81 MG tablet Take 81 mg by mouth daily.    [provider]  atorvastatin (LIPITOR) 80 MG tablet Take 1 tablet (80 mg total) by mouth daily. 11/22/20 03/15/21  Christell Constant, MD  CALCIUM-MAGNESIUM-ZINC PO Take 1 capsule by mouth daily.    [provider]  diltiazem (CARDIZEM SR) 120 MG 12 hr capsule Take 1 capsule by mouth once daily 06/28/21   Chandrasekhar, Mahesh A, MD  docusate sodium (COLACE) 100 MG capsule Take 100 mg by mouth 2 (two) times daily.    [provider]  ezetimibe (ZETIA) 10 MG tablet Take 1 tablet (10 mg total) by mouth  daily. 02/28/21   Christell Constant, MD  famotidine (PEPCID) 20 MG tablet TAKE ONE TABLET BY MOUTH ONCE DAILY AT BEDTIME 06/07/14   Nyoka Cowden, MD  furosemide (LASIX) 20 MG tablet Take 1 tablet by mouth once daily 04/22/21   Chandrasekhar, Mahesh A, MD  gabapentin (NEURONTIN) 300 MG capsule Take 300 mg by mouth 2 (two) times daily.    [provider]  glimepiride (AMARYL) 1 MG tablet Take 1 tablet by mouth daily. 07/27/14   [provider]  guaiFENesin (MUCINEX) 600 MG 12 hr tablet Take 1 tablet (600 mg total) by mouth 2 (two)  times daily. 11/16/17   Jetty DuhamelYoung, Clinton D, MD  ipratropium-albuterol (DUONEB) 0.5-2.5 (3) MG/3ML SOLN Inhale 3 mLs into the lungs every 6 (six) hours as needed (wheezing or shortness of breath). 01/03/21   Jetty DuhamelYoung, Clinton D, MD  isosorbide mononitrate (IMDUR) 30 MG 24 hr tablet Take 1 tablet by mouth once daily 04/22/21   Riley Lamhandrasekhar, Mahesh A, MD  levocetirizine (XYZAL) 5 MG tablet Take 5 mg at bedtime by mouth.  07/25/14   [provider]  montelukast (SINGULAIR) 10 MG tablet Take 10 mg at bedtime by mouth.  07/20/14   [provider]  Multiple Vitamin (MULTIVITAMIN WITH MINERALS) TABS tablet Take 1 tablet by mouth daily.    [provider]  nitroGLYCERIN (NITROSTAT) 0.4 MG SL tablet Place 1 tablet (0.4 mg total) under the tongue every 5 (five) minutes as needed for chest pain. 11/15/20 02/13/21  Christell Constanthandrasekhar, Mahesh A, MD  olmesartan (BENICAR) 20 MG tablet Take 20 mg daily by mouth.    [provider]  pantoprazole (PROTONIX) 40 MG tablet TAKE ONE TABLET BY MOUTH ONCE DAILY TAKE  30-60  MINUTES  BEFORE  FIRST  MEAL  OF  THE  DAY 06/07/14   Nyoka CowdenWert, Michael B, MD  predniSONE (DELTASONE) 10 MG tablet 1 every other day 11/30/20   Waymon BudgeYoung, Clinton D, MD  PROAIR HFA 108 (90 BASE) MCG/ACT inhaler Inhale 2 puffs into the lungs 4 (four) times daily as needed. 07/27/14   [provider]  Respiratory Therapy Supplies (FLUTTER)  DEVI Blow through 4 times per set, 3 sets per day when needed to clear lungs 02/11/16   Waymon BudgeYoung, Clinton D, MD  TRELEGY ELLIPTA 100-62.5-25 MCG/ACT AEPB USE 1 INHALATION BY MOUTH  DAILY 03/27/21   Jetty DuhamelYoung, Clinton D, MD      VITAL SIGNS:  Blood pressure 123/77, pulse 88, temperature 100.1 F (37.8 C), temperature source Oral, resp. rate (!) 21, height 6' (1.829 m), weight 102.6 kg, SpO2 97 %.  PHYSICAL EXAMINATION:  Physical Exam  GENERAL:  76 y.o.-year-old Caucasian male patient lying in the bed with no acute distress.  EYES: Pupils equal, round, reactive to light and accommodation. No scleral icterus. Extraocular muscles intact.  HEENT: Head atraumatic, normocephalic. Oropharynx and nasopharynx clear.  NECK:  Supple, no jugular venous distention. No thyroid enlargement, no tenderness.  LUNGS: Diminished left basal breath sounds with left basal crackles.  No use of accessory muscles of respiration.  CARDIOVASCULAR: Regular rate and rhythm, S1, S2 normal. No murmurs, rubs, or gallops.  ABDOMEN: Soft, nondistended, nontender. Bowel sounds present. No organomegaly or mass.  EXTREMITIES: No pedal edema, cyanosis, or clubbing.  NEUROLOGIC: Cranial nerves II through XII are intact. Muscle strength 5/5 in all extremities. Sensation intact. Gait not checked.  PSYCHIATRIC: The patient is alert and oriented x 3.  Normal affect and good eye contact. SKIN: No obvious rash, lesion, or ulcer.   LABORATORY PANEL:   CBC Recent Labs  Lab 07/05/21 0233  WBC 13.6*  HGB 15.6  HCT 46.3  PLT 194   ------------------------------------------------------------------------------------------------------------------  Chemistries  Recent Labs  Lab 07/05/21 0233  NA 137  K 4.0  CL 108  CO2 19*  GLUCOSE 197*  BUN 23  CREATININE 1.29*  CALCIUM 8.3*  AST 24  ALT 26  ALKPHOS 55  BILITOT 0.8    ------------------------------------------------------------------------------------------------------------------  Cardiac Enzymes No results for input(s): TROPONINI in the last 168 hours. ------------------------------------------------------------------------------------------------------------------  RADIOLOGY:  DG Chest Port 1 View  Result Date: 07/05/2021 CLINICAL DATA:  Possible sepsis. Fever, cough, chills, and vomiting. EXAM: PORTABLE CHEST 1 VIEW COMPARISON:  04/24/2020. 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 The mediastinal contour is within normal limits. Lung volumes are low resulting in vascular crowding. Patchy airspace disease is present at the left lung base. No definite effusion or pneumothorax. No acute osseous abnormality. IMPRESSION: Patchy airspace disease at the left lung base, possible atelectasis or infiltrate. Electronically Signed   By: Thornell SartoriusLaura  Taylor M.D.   On: 07/05/2021 03:32      IMPRESSION AND PLAN:  Assessment and Plan: * CAP (community acquired pneumonia) - The patient will be admitted to a medical telemetry bed. - We will continue antibiotic therapy with IV Levaquin. - Mucolytic therapy will be provided. - DuoNebs will be given 4 times daily every 4 hours as needed. - We will follow blood cultures.  Sepsis due to pneumonia [facility](HCC) - This is manifested by leukocytosis, tachypnea and fever as well as tachycardia. - Management as above. - We will continue Levaquin and follow blood cultures.  Dyslipidemia - We will continue statin therapy and Zetia.  Essential hypertension - We will continue oh Cardizem SR, Benicar and Imdur.  Type 2 diabetes mellitus without complications (HCC) - We will continue Amaryl. - We will place on supplement coverage with NovoLog. - We will continue Neurontin for associated peripheral neuropathy.  GERD without esophagitis - We will continue PPI therapy.  AKI (acute  kidney injury) (HCC) - The patient will be hydrated with IV normal saline. - We will follow BMP. - We will hold off nephrotoxins.  OSA (obstructive sleep apnea) - We will resume CPAP nightly.  COPD with chronic bronchitis (HCC) - We will continue current inhaler and DuoNebs.     DVT prophylaxis: Lovenox.  Advanced Care Planning:  Code Status: The patient is DNI only. Family Communication:  The plan of care was discussed in details with the patient (and family). I answered all questions. The patient agreed to proceed with the above mentioned plan. Further management will depend upon hospital course. Disposition Plan: Back to previous home environment Consults called: none.  All the records are reviewed and case discussed with ED provider.  Status is: Inpatient   At the time of the admission, it appears that the appropriate admission status for this patient is inpatient.  This is judged to be reasonable and necessary in order to provide the required intensity of service to ensure the patient's safety given the presenting symptoms, physical exam findings and initial radiographic and laboratory data in the context of comorbid conditions.  The patient requires inpatient status due to high intensity of service, high risk of further deterioration and high frequency of surveillance required.  I certify that at the time of admission, it is my clinical judgment that the patient will require inpatient hospital care extending more than 2 midnights.                            Dispo: The patient is from: Home              Anticipated d/c is to: Home              Patient currently is not medically stable to d/c.              Difficult to place patient: No  Hannah BeatJan A Asianna Brundage M.D on 07/05/2021 at 4:31 AM  [facility]  From 7 PM-7 AM, contact night-coverage [email or web address]  CC: Primary care physician; [name], MD

## 2021-07-05 NOTE — Assessment & Plan Note (Addendum)
Continue home Cardizem 120 mg p.o. BID, Imdur 30 mg p.o. daily, olmesartan 20 mg p.o. daily, furosemide 20 mg p.o. daily. Continue aspirin and statin ? ? ?

## 2021-07-05 NOTE — ED Provider Triage Note (Signed)
Emergency Medicine Provider Triage Evaluation Note ? ?[name] [name] , a 76 y.o. male  was evaluated in triage.  Pt complains of fever, cough, shortness of breath.  Reports he has been feeling poorly over the past 2 to 3 days but tonight at 10:30 he felt like his breathing got worse.  Denies current chest pain but does report that it was hurting some earlier.  Denies abdominal pain, did have 1 episode of vomiting with EMS.  No diarrhea or dysuria.  Unknown sick contacts. ? ?Review of Systems  ?Positive: Cough, fever, shortness of breath, vomiting ?Negative: Abdominal pain, dysuria ? ?Physical Exam  ?BP 137/84   Pulse 96   Temp (!) 102.7 ?F (39.3 ?C) (Oral)   Resp (!) 22   SpO2 98%  ?Gen:   Awake, ill-appearing ?Resp:  Tachypneic with some increased respiratory effort, breath sounds diminished bilaterally, frequent cough during exam ?MSK:   Moves extremities without difficulty  ?Other:  No abdominal tenderness ? ?Medical Decision Making  ?Medically screening exam initiated at 2:11 AM.  Appropriate orders placed.  [name] [name] was informed that the remainder of the evaluation will be completed by another provider, this initial triage assessment does not replace that evaluation, and the importance of remaining in the ED until their evaluation is complete. ? ?Ill-appearing, febrile and meeting sepsis criteria, will need bed for further evaluation. ?  ?[name] Lodge, PA-C ?07/05/21 0221 ? ?

## 2021-07-05 NOTE — Assessment & Plan Note (Addendum)
Gabapentin 300 mg p.o. twice daily ?

## 2021-07-05 NOTE — Hospital Course (Signed)
[name] [name] is a 76 year old male with past medical history significant for COPD, DM2, essential hypertension, HLD, peripheral neuropathy, OSA on nocturnal BiPAP who presented to [facility] ED on 3/3 via EMS with fever, cough and chills.  She also reports nausea with 1 episode of vomiting and diarrhea in the ED.  Denies chest pain, no palpitations, no dysuria, no urinary frequency or urgency. ? ?In the ED, temperature 102.7 ?F, HR 96, RR 22, BP 137/84, SPO2 93% on 1 L nasal cannula.  WBC 13.6, hemoglobin 15.6, platelets 194.  Sodium 137, potassium 4.0, chloride 108, CO2 19, glucose 197, BUN 23, creatinine 1.29, AST 24, ALT 26, total bilirubin 0.8.  Lactic acid 2.3.  High sensitive troponin 10>10.  Procalcitonin 0.86.  COVID-19 PCR negative.  Influenza A/B PCR negative.  Urinalysis unrevealing.  Chest x-ray with patchy airspace disease left lung base concerning for infiltrate.  AG with NSR, rate 86, no concerning dynamic changes.  Patient was started on IV Levaquin, given Xopenex/Atrovent neb, fentanyl 50 mcg IV x1 and 1 L NS bolus.  EDP consulted [facility] for further evaluation management of sepsis secondary to pneumonia ?

## 2021-07-05 NOTE — Assessment & Plan Note (Addendum)
Hemoglobin A1c 8.1, not optimally controlled.  Home regimen includes glimepiride 1 mg p.o. daily.  Outpatient follow-up PCP. ? ?

## 2021-07-06 DIAGNOSIS — J189 Pneumonia, unspecified organism: Secondary | ICD-10-CM | POA: Diagnosis not present

## 2021-07-06 DIAGNOSIS — A419 Sepsis, unspecified organism: Secondary | ICD-10-CM | POA: Diagnosis not present

## 2021-07-06 LAB — BASIC METABOLIC PANEL
Anion gap: 4 — ABNORMAL LOW (ref 5–15)
BUN: 19 mg/dL (ref 8–23)
CO2: 24 mmol/L (ref 22–32)
Calcium: 7.9 mg/dL — ABNORMAL LOW (ref 8.9–10.3)
Chloride: 110 mmol/L (ref 98–111)
Creatinine, Ser: 1.16 mg/dL (ref 0.61–1.24)
GFR, Estimated: >60 mL/min (ref >60)
Glucose, Bld: 147 mg/dL — ABNORMAL HIGH (ref 70–99)
Potassium: 3.7 mmol/L (ref 3.5–5.1)
Sodium: 138 mmol/L (ref 135–145)

## 2021-07-06 LAB — CBC
HCT: 37 % — ABNORMAL LOW (ref 39.0–52.0)
Hemoglobin: 12.5 g/dL — ABNORMAL LOW (ref 13.0–17.0)
MCH: 32.3 pg (ref 26.0–34.0)
MCHC: 33.8 g/dL (ref 30.0–36.0)
MCV: 95.6 fL (ref 80.0–100.0)
Platelets: 147 10*3/uL — ABNORMAL LOW (ref 150–400)
RBC: 3.87 MIL/uL — ABNORMAL LOW (ref 4.22–5.81)
RDW: 13.7 % (ref 11.5–15.5)
WBC: 7.4 10*3/uL (ref 4.0–10.5)
nRBC: 0 % (ref 0.0–0.2)

## 2021-07-06 LAB — URINE CULTURE: Culture: 20000 — AB

## 2021-07-06 LAB — GLUCOSE, CAPILLARY
Glucose-Capillary: 139 mg/dL — ABNORMAL HIGH (ref 70–99)
Glucose-Capillary: 113 mg/dL — ABNORMAL HIGH (ref 70–99)

## 2021-07-06 LAB — LACTIC ACID, PLASMA: Lactic Acid, Venous: 1.6 mmol/L (ref 0.5–1.9)

## 2021-07-06 MED ORDER — LEVOFLOXACIN 500 MG PO TABS: 500.0000 mg | ORAL_TABLET | Freq: Every day | ORAL | Status: DC

## 2021-07-06 MED ORDER — LEVOFLOXACIN 500 MG PO TABS: 500.0000 mg | ORAL_TABLET | Freq: Every day | ORAL | 0 refills | Status: AC

## 2021-07-06 NOTE — Progress Notes (Signed)
DISCHARGE NOTE HOME ?[name] [name] to be discharged Home per MD order. Discussed prescriptions and follow up appointments with the patient. Prescriptions given to patient; medication list explained in detail. Patient verbalized understanding. ? ?Skin clean, dry and intact without evidence of skin break down, no evidence of skin tears noted. IV catheter discontinued intact. Site without signs and symptoms of complications. Dressing and pressure applied. Pt denies pain at the site currently. No complaints noted. ? ?Patient free of lines, drains, and wounds.  ? ?An After Visit Summary (AVS) was printed and given to the patient. ?Patient escorted via wheelchair, and discharged home via private auto. ? ?[name], RN   ?

## 2021-07-06 NOTE — Evaluation (Signed)
Occupational Therapy Evaluation/Discharge ?Patient Details ?Name: [name] [name] ?MRN: [id number] ?DOB: 1945-07-14 ?Today's Date: 07/06/2021 ? ? ?History of Present Illness 76 y/o male presented to ED on 07/05/21 for c/o fever, cough, and chills. Chest X-ray showed patchy airspace disease in the L lung base possibly atelectasis or infiltrate. Found to have sepsis 2/2 pneumonia. PMH: COPD, T2DM, HTN, peripheral neuropathy  ? ?Clinical Impression ?  ?PTA, pt lives with brother and reports Modified Independence with all ADLs, IADLs and mobility with intermittent cane use. Pt presents now with minor deficits in dynamic standing balance (reports this is baseline) and cardiopulmonary endurance. Overall, Pt Independent for transfers without AD and able to mobilize without assist in room. Pt MOD I for ADLs, able to demo tub transfer simulation easily this AM. Encouraged flutter valve/IS use for pulmonary health. Anticipate once PNA resolves, pt will quickly return to PLOF without need for OT follow-up. ? ?SpO2 97% on RA at rest, down to 90% with in-room ADLs and increasing to 93% quickly with seated rest break. Mild dyspnea noted.  ?   ? ?Recommendations for follow up therapy are one component of a multi-disciplinary discharge planning process, led by the attending physician.  Recommendations may be updated based on patient status, additional functional criteria and insurance authorization.  ? ?Follow Up Recommendations ? No OT follow up  ?  ?Assistance Recommended at Discharge None  ?Patient can return home with the following   ? ?  ?Functional Status Assessment ? Patient has not had a recent decline in their functional status  ?Equipment Recommendations ? None recommended by OT  ?  ?Recommendations for Other Services   ? ? ?  ?Precautions / Restrictions Precautions ?Precautions: Fall;Other (comment) ?Precaution Comments: monitor O2 (does not wear at baseline) ?Restrictions ?Weight Bearing Restrictions: No  ? ?  ? ?Mobility Bed  Mobility ?Overal bed mobility: Modified Independent ?  ?  ?  ?  ?  ?  ?  ?  ? ?Transfers ?Overall transfer level: Independent ?Equipment used: None ?  ?  ?  ?  ?  ?  ?  ?  ?  ? ?  ?Balance Overall balance assessment: Mild deficits observed, not formally tested ?  ?  ?  ?  ?  ?  ?  ?  ?  ?  ?  ?  ?  ?  ?  ?  ?  ?  ?   ? ?ADL either performed or assessed with clinical judgement  ? ?ADL Overall ADL's : Modified independent ?  ?  ?  ?  ?  ?  ?  ?  ?  ?  ?  ?  ?  ?  ?  ?  ?  ?  ?  ?General ADL Comments: able to mobilize around in room without AD and without assist. Mild staggering standing at sink for ADLs but able to correct easily (reports this happens in the morning at home, when first OOB). Able to don socks, gown around back in standing, and demonstrate tub transfer simulation without assist.  ? ? ? ?Vision Baseline Vision/History: 1 Wears glasses ?Ability to See in Adequate Light: 1 Impaired ?Patient Visual Report: No change from baseline ?Vision Assessment?: No apparent visual deficits  ?   ?Perception   ?  ?Praxis   ?  ? ?Pertinent Vitals/Pain Pain Assessment ?Pain Assessment: No/denies pain  ? ? ? ?Hand Dominance Right ?  ?Extremity/Trunk Assessment Upper Extremity Assessment ?Upper Extremity Assessment: Overall WFL for tasks  assessed ?  ?Lower Extremity Assessment ?Lower Extremity Assessment: Defer to PT evaluation ?  ?Cervical / Trunk Assessment ?Cervical / Trunk Assessment: Normal ?  ?Communication Communication ?Communication: No difficulties ?  ?Cognition Arousal/Alertness: Awake/alert ?Behavior During Therapy: [facility] for tasks assessed/performed ?Overall Cognitive Status: Within Functional Limits for tasks assessed ?  ?  ?  ?  ?  ?  ?  ?  ?  ?  ?  ?  ?  ?  ?  ?  ?  ?  ?  ?General Comments    ? ?  ?Exercises Exercises: Other exercises ?Other Exercises ?Other Exercises: flutter valve x 5 ?Other Exercises: IS x 5 (up to 2250 mL) ?  ?Shoulder Instructions    ? ? ?Home Living Family/patient expects to be  discharged to:: Private residence ?Living Arrangements: Other relatives (brother) ?Available Help at Discharge: Family;Available 24 hours/day ?Type of Home: Mobile home ?Home Access: Stairs to enter ?Entrance Stairs-Number of Steps: 4 ?Entrance Stairs-Rails: Right ?Home Layout: One level ?  ?  ?Bathroom Shower/Tub: Tub/shower unit ?  ?Bathroom Toilet: Standard ?  ?  ?Home Equipment: Gilmer Mor - single point ?  ?  ?  ? ?  ?Prior Functioning/Environment Prior Level of Function : Independent/Modified Independent;Driving ?  ?  ?  ?  ?  ?  ?Mobility Comments: intermittent use of cane for mobility if feeling like his balance is off; denies any recent falls ?ADLs Comments: Independent with ADLs, IADLs (simple microwave meals), drives, grocery shops. Enjoys fishing and working out in barn ?  ? ?  ?  ?OT Problem List: Impaired balance (sitting and/or standing);Cardiopulmonary status limiting activity ?  ?   ?OT Treatment/Interventions:    ?  ?OT Goals(Current goals can be found in the care plan section) Acute Rehab OT Goals ?Patient Stated Goal: go home soon, not need O2 ?OT Goal Formulation: All assessment and education complete, DC therapy  ?OT Frequency:   ?  ? ?Co-evaluation   ?  ?  ?  ?  ? ?  ?AM-PAC OT "6 Clicks" Daily Activity     ?Outcome Measure Help from another person eating meals?: None ?Help from another person taking care of personal grooming?: None ?Help from another person toileting, which includes using toliet, bedpan, or urinal?: None ?Help from another person bathing (including washing, rinsing, drying)?: None ?Help from another person to put on and taking off regular upper body clothing?: None ?Help from another person to put on and taking off regular lower body clothing?: None ?6 Click Score: 24 ?  ?End of Session Nurse Communication: Mobility status ? ?Activity Tolerance: Patient tolerated treatment well ?Patient left: in chair;with call bell/phone within reach ? ?OT Visit Diagnosis: Unsteadiness on feet  (R26.81)  ?              ?Time: 9485-4627 ?OT Time Calculation (min): 23 min ?Charges:  OT General Charges ?$OT Visit: 1 Visit ?OT Evaluation ?$OT Eval Low Complexity: 1 Low ? ?Bradd Canary, OTR/L ?Acute Rehab Services ?Office: [phone]  ? ?Lorre Munroe ?07/06/2021, 10:12 AM ?

## 2021-07-06 NOTE — Care Management CC44 (Signed)
Condition Code 44 Documentation Completed ? ?Patient Details  ?Name: [name] [name] ?MRN: [id number] ?Date of Birth: 1945-05-13 ? ? ?Condition Code 44 given:  Yes ?Patient signature on Condition Code 44 notice:  Yes ?Documentation of 2 MD's agreement:  Yes ?Code 44 added to claim:  Yes ? ? ? ?Kallie Locks, RN ?07/06/2021, 12:40 PM ? ?

## 2021-07-06 NOTE — Progress Notes (Signed)
Plan for levaquin x5d for PNA. Stop date is in place. Rx will sign off.  ? ?Ulyses Southward, PharmD, BCIDP, AAHIVP, CPP ?Infectious Disease Pharmacist ?07/06/2021 10:25 AM ? ? ?

## 2021-07-06 NOTE — Care Management Obs Status (Signed)
MEDICARE OBSERVATION STATUS NOTIFICATION ? ? ?Patient Details  ?Name: [name] [name] ?MRN: [id number] ?Date of Birth: 12-Dec-1945 ? ? ?Medicare Observation Status Notification Given:  Yes ? ? ? ?Kallie Locks, RN ?07/06/2021, 12:40 PM ?

## 2021-07-06 NOTE — TOC Transition Note (Signed)
Transition of Care (TOC) - CM/SW Discharge Note ? ? ?Patient Details  ?Name: [name] [name] ?MRN: [id number] ?Date of Birth: 1945/09/14 ? ?Transition of Care (TOC) CM/SW Contact:  ?Kallie Locks, RN ?Phone Number: [phone] ?07/06/2021, 12:43 PM ? ? ?Clinical Narrative:  Spoke with [name] at bedside. Discussed TOC needs. [name] states he lives with his brother. Denies having any DME needs. States he has walker, bipap, cane at home. Writer confirmed with PT that there are no therapy recommendations. ? ? ? ?Final next level of care: Home/Self Care ?Barriers to Discharge: No Barriers Identified ? ? ?Patient Goals and CMS Choice ?Patient states their goals for this hospitalization and ongoing recovery are:: return home ?  ?  ? ?Discharge Placement ?  ?           ?  ?  ?  ?  ? ?Discharge Plan and Services ?  ?  ?           ?  ?  ?  ?  ?  ?  ?  ?  ?  ?  ? ?Social Determinants of Health (SDOH) Interventions ?  ? ? ?Readmission Risk Interventions ?No flowsheet data found. ? ? ? ? ?

## 2021-07-06 NOTE — Evaluation (Signed)
Physical Therapy Evaluation & Discharge ?Patient Details ?Name: [name] [name] ?MRN: [id number] ?DOB: [birth year] ?Today's Date: 07/06/2021 ? ?History of Present Illness ? 76 y/o male presented to ED on 07/05/21 for c/o fever, cough, and chills. Chest X-ray showed patchy airspace disease in the L lung base possibly atelectasis or infiltrate. Found to have sepsis 2/2 pneumonia. PMH: COPD, T2DM, HTN, peripheral neuropathy  ?Clinical Impression ? Patient admitted with above. Patient functioning at modI level with no AD which is patient's baseline. Patient ambulated on RA with spO2 maintaining at 92-93% throughout. Patient feels good and ready to d/c if able. No further skilled PT needs identified acutely. No PT follow up recommended at this time.    ?   ? ?Recommendations for follow up therapy are one component of a multi-disciplinary discharge planning process, led by the attending physician.  Recommendations may be updated based on patient status, additional functional criteria and insurance authorization. ? ?Follow Up Recommendations No PT follow up ? ?  ?Assistance Recommended at Discharge None  ?Patient can return home with the following ?   ? ?  ?Equipment Recommendations None recommended by PT  ?Recommendations for Other Services ?    ?  ?Functional Status Assessment Patient has not had a recent decline in their functional status  ? ?  ?Precautions / Restrictions Precautions ?Precautions: Fall ?Precaution Comments: monitor O2 (does not wear at baseline) ?Restrictions ?Weight Bearing Restrictions: No  ? ?  ? ?Mobility ? Bed Mobility ?Overal bed mobility: Modified Independent ?  ?  ?  ?  ?  ?  ?  ?  ? ?Transfers ?Overall transfer level: Independent ?Equipment used: None ?  ?  ?  ?  ?  ?  ?  ?  ?  ? ?Ambulation/Gait ?Ambulation/Gait assistance: Modified independent (Device/Increase time) ?Gait Distance (Feet): 400 Feet ?Assistive device: None ?Gait Pattern/deviations: WFL(Within Functional Limits) ?  ?Gait velocity  interpretation: >4.37 ft/sec, indicative of normal walking speed ?  ?General Gait Details: spO2 92-93% during ambulation on RA ? ?Stairs ?Stairs: Yes ?Stairs assistance: Modified independent (Device/Increase time) ?Stair Management: One rail Right, Alternating pattern, Forwards ?Number of Stairs: 3 ?  ? ?Wheelchair Mobility ?  ? ?Modified Rankin (Stroke Patients Only) ?  ? ?  ? ?Balance Overall balance assessment: Mild deficits observed, not formally tested ?  ?  ?  ?  ?  ?  ?  ?  ?  ?  ?  ?  ?  ?  ?  ?  ?  ?  ?   ? ? ? ?Pertinent Vitals/Pain Pain Assessment ?Pain Assessment: No/denies pain  ? ? ?Home Living Family/patient expects to be discharged to:: Private residence ?Living Arrangements: Other relatives (brother) ?Available Help at Discharge: Family;Available 24 hours/day ?Type of Home: Mobile home ?Home Access: Stairs to enter ?Entrance Stairs-Rails: Right ?Entrance Stairs-Number of Steps: 4 ?  ?Home Layout: One level ?Home Equipment: Gilmer Mor - single point ?   ?  ?Prior Function Prior Level of Function : Independent/Modified Independent;Driving ?  ?  ?  ?  ?  ?  ?Mobility Comments: intermittent use of cane for mobility if feeling like his balance is off; denies any recent falls ?ADLs Comments: Independent with ADLs, IADLs (simple microwave meals), drives, grocery shops. Enjoys fishing and working out in barn ?  ? ? ?Hand Dominance  ? Dominant Hand: Right ? ?  ?Extremity/Trunk Assessment  ? Upper Extremity Assessment ?Upper Extremity Assessment: Defer to OT evaluation ?  ? ?Lower Extremity  Assessment ?Lower Extremity Assessment: Overall WFL for tasks assessed ?  ? ?Cervical / Trunk Assessment ?Cervical / Trunk Assessment: Normal  ?Communication  ? Communication: No difficulties  ?Cognition Arousal/Alertness: Awake/alert ?Behavior During Therapy: [facility] for tasks assessed/performed ?Overall Cognitive Status: Within Functional Limits for tasks assessed ?  ?  ?  ?  ?  ?  ?  ?  ?  ?  ?  ?  ?  ?  ?  ?  ?  ?  ?  ? ?   ?General Comments   ? ?  ?Exercises    ? ?Assessment/Plan  ?  ?PT Assessment Patient does not need any further PT services  ?PT Problem List   ? ?   ?  ?PT Treatment Interventions     ? ?PT Goals (Current goals can be found in the Care Plan section)  ?Acute Rehab PT Goals ?Patient Stated Goal: to go home ?PT Goal Formulation: All assessment and education complete, DC therapy ? ?  ?Frequency   ?  ? ? ?Co-evaluation   ?  ?  ?  ?  ? ? ?  ?AM-PAC PT "6 Clicks" Mobility  ?Outcome Measure Help needed turning from your back to your side while in a flat bed without using bedrails?: None ?Help needed moving from lying on your back to sitting on the side of a flat bed without using bedrails?: None ?Help needed moving to and from a bed to a chair (including a wheelchair)?: None ?Help needed standing up from a chair using your arms (e.g., wheelchair or bedside chair)?: None ?Help needed to walk in hospital room?: None ?Help needed climbing 3-5 steps with a railing? : None ?6 Click Score: 24 ? ?  ?End of Session   ?Activity Tolerance: Patient tolerated treatment well ?Patient left: in bed;with call bell/phone within reach ?Nurse Communication: Mobility status ?PT Visit Diagnosis: Muscle weakness (generalized) (M62.81) ?  ? ?Time: 5638-9373 ?PT Time Calculation (min) (ACUTE ONLY): 9 min ? ? ?Charges:   PT Evaluation ?$PT Eval Low Complexity: 1 Low ?  ?  ?   ? ? ?Vaniyah Lansky A. Dan Humphreys, PT, DPT ?Acute Rehabilitation Services ?Pager [phone] ?Office [phone] ? ? ?Ariz Terrones A Oris Calmes ?07/06/2021, 12:41 PM ? ?

## 2021-07-06 NOTE — Discharge Summary (Signed)
Physician Discharge Summary  [name] [name] [id number] DOB: [birth year] DOA: 07/05/2021  PCP: Shirline Frees, MD  Admit date: 07/05/2021 Discharge date: 07/06/2021  Admitted From: Home Disposition: Home  Recommendations for Outpatient Follow-up:  Follow up with PCP in 1-2 weeks Continue antibiotics with Levaquin to complete 5-day course for community-acquired pneumonia  Home Health: No, none identified on evaluation by PT/OT Equipment/Devices: None  Discharge Condition: Stable CODE STATUS: Full code Diet recommendation: Heart healthy/consistent carb regular diet  History of present illness:  [name] [name] is a 76 year old male with past medical history significant for COPD, DM2, essential hypertension, HLD, peripheral neuropathy, OSA on nocturnal BiPAP who presented to [facility] ED on 3/3 via EMS with fever, cough and chills.  She also reports nausea with 1 episode of vomiting and diarrhea in the ED.  Denies chest pain, no palpitations, no dysuria, no urinary frequency or urgency.  In the ED, temperature 102.7 F, HR 96, RR 22, BP 137/84, SPO2 93% on 1 L nasal cannula.  WBC 13.6, hemoglobin 15.6, platelets 194.  Sodium 137, potassium 4.0, chloride 108, CO2 19, glucose 197, BUN 23, creatinine 1.29, AST 24, ALT 26, total bilirubin 0.8.  Lactic acid 2.3.  High sensitive troponin 10>10.  Procalcitonin 0.86.  COVID-19 PCR negative.  Influenza A/B PCR negative.  Urinalysis unrevealing.  Chest x-ray with patchy airspace disease left lung base concerning for infiltrate.  AG with NSR, rate 86, no concerning dynamic changes.  Patient was started on IV Levaquin, given Xopenex/Atrovent neb, fentanyl 50 mcg IV x1 and 1 L NS bolus.  EDP consulted [facility] for further evaluation management of sepsis secondary to pneumonia  Hospital course:  Assessment and Plan: * Sepsis due to pneumonia [facility]) Patient presenting to the ED with fever, cough, weakness and was found to have a temperature of 102.7, tachypnea,  leukocytosis with WBC count of 13.7 with lactic acid of 2.3 and slight increase in creatinine.  Chest x-ray findings of left lower lobe infiltrate.  Urinalysis unrevealing.  Elevated procalcitonin 0.86.  Patient was started on IV Levaquin and initially required supplemental oxygen which was titrated off.  Patient has remained afebrile for greater than 24 hours and will continue Levaquin 500 mg p.o. daily to complete 5-day course.  Patient was seen by PT/OT and no desaturations noted on ambulatory O2 screening.  Continue incentive spirometry, flutter valve on discharge.  Follow-up with PCP in 1 week.  Dyslipidemia Continue home Zetia 10 mg p.o. daily, Atorvastatin 80 mg p.o. daily  Essential hypertension Continue home Cardizem 120 mg p.o. BID, Imdur 30 mg p.o. daily, olmesartan 20 mg p.o. daily, furosemide 20 mg p.o. daily. Continue aspirin and statin    Type 2 diabetes mellitus without complications (HCC) Hemoglobin A1c 8.1, not optimally controlled.  Home regimen includes glimepiride 1 mg p.o. daily.  Outpatient follow-up PCP.   GERD without esophagitis Continue home Protonix 40 mg p.o. daily, Pepcid 20 mg p.o. daily  COPD with chronic bronchitis ([place]) Follows with pulmonology, Dr. Annamaria Boots outpatient.  Currently on trilogy Ellipta 1 puff daily, Singulair 10 mg p.o. daily, prednisone 10 mg p.o. every other day outpatient.  Not oxygen dependent.  No significant desaturations on ambulatory O2 screen at time of discharge.  Outpatient follow-up with pulmonology as scheduled.  OSA (obstructive sleep apnea) Continue nocturnal BiPAP  CKD (chronic kidney disease) stage 2, GFR 60-89 ml/min Baseline creatinine 1.2.  On admission, creatinine up to 1.29.  Slightly increased bump likely due to sepsis as above.  Creatinine improved  to 1.16 at time of discharge.  Physical deconditioning Seen by physical therapy and Occupational Therapy, no needs identified at time of discharge.  Peripheral  neuropathy Gabapentin 300 mg p.o. twice daily       Discharge Diagnoses:  Principal Problem:   Sepsis due to pneumonia [facility]) Active Problems:   Dyslipidemia   Essential hypertension   Type 2 diabetes mellitus without complications (HCC)   GERD without esophagitis   COPD with chronic bronchitis (HCC)   OSA (obstructive sleep apnea)   CKD (chronic kidney disease) stage 2, GFR 60-89 ml/min   Peripheral neuropathy   Physical deconditioning    Discharge Instructions  Discharge Instructions     Call MD for:  difficulty breathing, headache or visual disturbances   Complete by: As directed    Call MD for:  extreme fatigue   Complete by: As directed    Call MD for:  persistant dizziness or light-headedness   Complete by: As directed    Call MD for:  persistant nausea and vomiting   Complete by: As directed    Call MD for:  severe uncontrolled pain   Complete by: As directed    Call MD for:  temperature >100.4   Complete by: As directed    Diet - low sodium heart healthy   Complete by: As directed    Increase activity slowly   Complete by: As directed       Allergies as of 07/06/2021       Reactions   Penicillins Anaphylaxis   Has patient had a PCN reaction causing immediate rash, facial/tongue/throat swelling, SOB or lightheadedness with hypotension: Yes Has patient had a PCN reaction causing severe rash involving mucus membranes or skin necrosis: No Has patient had a PCN reaction that required hospitalization: Yes Has patient had a PCN reaction occurring within the last 10 years: No If all of the above answers are "NO", then may proceed with Cephalosporin use.   Lopressor [metoprolol Tartrate]    " It drops my heart rate."   Codeine Rash        Medication List     TAKE these medications    acetaminophen 650 MG CR tablet Commonly known as: TYLENOL Take 1,300 mg daily as needed by mouth for pain.   aspirin 81 MG tablet Take 81 mg by mouth daily.    atorvastatin 80 MG tablet Commonly known as: LIPITOR Take 1 tablet (80 mg total) by mouth daily.   CALCIUM-MAGNESIUM-ZINC PO Take 1 capsule by mouth daily.   diltiazem 120 MG 12 hr capsule Commonly known as: CARDIZEM SR Take 1 capsule by mouth once daily What changed: when to take this   docusate sodium 100 MG capsule Commonly known as: COLACE Take 100 mg by mouth 2 (two) times daily.   ezetimibe 10 MG tablet Commonly known as: ZETIA Take 1 tablet (10 mg total) by mouth daily.   famotidine 20 MG tablet Commonly known as: PEPCID TAKE ONE TABLET BY MOUTH ONCE DAILY AT BEDTIME What changed: when to take this   Brady through 4 times per set, 3 sets per day when needed to clear lungs   furosemide 20 MG tablet Commonly known as: LASIX Take 1 tablet by mouth once daily   gabapentin 300 MG capsule Commonly known as: NEURONTIN Take 300 mg by mouth 2 (two) times daily.   glimepiride 1 MG tablet Commonly known as: AMARYL Take 1 tablet by mouth daily.   guaiFENesin 600 MG 12 hr  tablet Commonly known as: MUCINEX Take 1 tablet (600 mg total) by mouth 2 (two) times daily.   ipratropium-albuterol 0.5-2.5 (3) MG/3ML Soln Commonly known as: DUONEB Inhale 3 mLs into the lungs every 6 (six) hours as needed (wheezing or shortness of breath).   isosorbide mononitrate 30 MG 24 hr tablet Commonly known as: IMDUR Take 1 tablet by mouth once daily   levocetirizine 5 MG tablet Commonly known as: XYZAL Take 5 mg at bedtime by mouth.   levofloxacin 500 MG tablet Commonly known as: LEVAQUIN Take 1 tablet (500 mg total) by mouth daily with breakfast for 3 days. Start taking on: July 07, 2021   montelukast 10 MG tablet Commonly known as: SINGULAIR Take 10 mg at bedtime by mouth.   multivitamin with minerals Tabs tablet Take 1 tablet by mouth daily.   nitroGLYCERIN 0.4 MG SL tablet Commonly known as: NITROSTAT Place 1 tablet (0.4 mg total) under the tongue every 5  (five) minutes as needed for chest pain.   olmesartan 20 MG tablet Commonly known as: BENICAR Take 20 mg daily by mouth.   pantoprazole 40 MG tablet Commonly known as: PROTONIX TAKE ONE TABLET BY MOUTH ONCE DAILY TAKE  30-60  MINUTES  BEFORE  FIRST  MEAL  OF  THE  DAY What changed: See the new instructions.   predniSONE 10 MG tablet Commonly known as: DELTASONE 1 every other day What changed:  how much to take how to take this when to take this additional instructions   ProAir HFA 108 (90 Base) MCG/ACT inhaler Generic drug: albuterol Inhale 2 puffs into the lungs 4 (four) times daily as needed for wheezing or shortness of breath.   Trelegy Ellipta 100-62.5-25 MCG/ACT Aepb Generic drug: Fluticasone-Umeclidin-Vilant USE 1 INHALATION BY MOUTH  DAILY What changed: See the new instructions.        Follow-up Information     Shirline Frees, MD. Schedule an appointment as soon as possible for a visit in 1 week(s).   Specialty: Family Medicine Contact information: [place] [address] [phone]         Deboraha Sprang, MD .   Specialty: Cardiology Contact information: [phone] [place] [address] [phone]         Werner Lean, MD .   Specialty: Cardiology Contact information: [place] [address] [place] [address] [phone]                Allergies  Allergen Reactions   Penicillins Anaphylaxis    Has patient had a PCN reaction causing immediate rash, facial/tongue/throat swelling, SOB or lightheadedness with hypotension: Yes Has patient had a PCN reaction causing severe rash involving mucus membranes or skin necrosis: No Has patient had a PCN reaction that required hospitalization: Yes Has patient had a PCN reaction occurring within the last 10 years: No If all of the above answers are "NO", then may proceed with Cephalosporin use.    Lopressor [Metoprolol  Tartrate]     " It drops my heart rate."   Codeine Rash    Consultations: none   Procedures/Studies: DG Chest Port 1 View  Result Date: 07/05/2021 CLINICAL DATA:  Possible sepsis. Fever, cough, chills, and vomiting. EXAM: PORTABLE CHEST 1 VIEW COMPARISON:  04/24/2020. 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 The mediastinal contour is within normal limits. Lung volumes are low resulting in vascular crowding. Patchy  airspace disease is present at the left lung base. No definite effusion or pneumothorax. No acute osseous abnormality. IMPRESSION: Patchy airspace disease at the left lung base, possible atelectasis or infiltrate. Electronically Signed   By: Brett Fairy M.D.   On: 07/05/2021 03:32     Subjective: Patient seen examined bedside, resting comfortably.  No specific complaints this morning.  Oxygen now titrated off, seen by PT/OT with no further recommendations.  No desaturation on ambulatory O2 screening.  Patient has been afebrile last 24 hours and states ready for discharge home.  No other specific complaints or concerns at this time.  Patient denies headache, no dizziness, no chest pain, no palpitations, no shortness of breath, no abdominal pain, no weakness, no fatigue, no paresthesias, no further fevers, no chills, no night sweats, no nausea/vomiting/diarrhea.  No acute events overnight per nursing staff.  Discharge Exam: Vitals:   07/06/21 0400 07/06/21 0832  BP: 115/75 134/80  Pulse: 70 67  Resp: 18 18  Temp: 97.9 F (36.6 C) 98.8 F (37.1 C)  SpO2: 96% 97%   Vitals:   07/05/21 2256 07/06/21 0002 07/06/21 0400 07/06/21 0832  BP:  118/66 115/75 134/80  Pulse: 64 65 70 67  Resp: 16 18 18 18   Temp:  98.1 F (36.7 C) 97.9 F (36.6 C) 98.8 F (37.1 C)  TempSrc:  Oral Oral   SpO2: 95% 95% 96% 97%  Weight:   105.1 kg   Height:        Physical Exam: GEN: NAD, alert and oriented x 3, elderly in appearance HEENT: NCAT,  PERRL, EOMI, sclera clear, MMM PULM: CTAB w/o wheezes/crackles, normal respiratory effort, on room air CV: RRR w/o M/G/R GI: abd soft, NTND, NABS, no R/G/M MSK: no peripheral edema, muscle strength globally intact 5/5 bilateral upper/lower extremities NEURO: CN II-XII intact, no focal deficits, sensation to light touch intact PSYCH: normal mood/affect Integumentary: dry/intact, no rashes or wounds    The results of significant diagnostics from this hospitalization (including imaging, microbiology, ancillary and laboratory) are listed below for reference.     Microbiology: Recent Results (from the past 240 hour(s))  Urine Culture     Status: Abnormal   Collection Time: 07/05/21  2:16 AM   Specimen: In/Out Cath Urine  Result Value Ref Range Status   Specimen Description IN/OUT CATH URINE  Final   Special Requests   Final    NONE Performed at [facility], [address], [place], [place] [address]    Culture (A)  Final    20,000 COLONIES/mL MULTIPLE SPECIES PRESENT, SUGGEST RECOLLECTION   Report Status 07/06/2021 FINAL  Final  Blood Culture (routine x 2)     Status: None (Preliminary result)   Collection Time: 07/05/21  2:33 AM   Specimen: BLOOD  Result Value Ref Range Status   Specimen Description BLOOD RIGHT ANTECUBITAL  Final   Special Requests   Final    BOTTLES DRAWN AEROBIC AND ANAEROBIC Blood Culture adequate volume   Culture   Final    NO GROWTH 1 DAY Performed at [facility], [place] [address], [place], [place] [address]    Report Status PENDING  Incomplete  Blood Culture (routine x 2)     Status: None (Preliminary result)   Collection Time: 07/05/21  2:33 AM   Specimen: BLOOD LEFT HAND  Result Value Ref Range Status   Specimen Description BLOOD LEFT HAND  Final   Special Requests AEROBIC BOTTLE ONLY Blood Culture adequate volume  Final   Culture   Final    NO GROWTH 1 DAY Performed at [facility], [place] [address], [place], [place] [address]     Report Status PENDING  Incomplete  Resp Panel by RT-PCR (Flu A&B, Covid) Nasopharyngeal Swab     Status: None   Collection Time: 07/05/21  2:48 AM   Specimen: Nasopharyngeal Swab; Nasopharyngeal(NP) swabs in vial transport medium  Result Value Ref Range Status   SARS Coronavirus 2 by RT PCR NEGATIVE NEGATIVE Final    Comment: (NOTE) SARS-CoV-2 target nucleic acids are NOT DETECTED.  The SARS-CoV-2 RNA is generally detectable in upper respiratory specimens during the acute phase of infection. The lowest concentration of SARS-CoV-2 viral copies this assay can detect is 138 copies/mL. A negative result does not preclude SARS-Cov-2 infection and should not be used as the sole basis for treatment or other patient management decisions. A negative result may occur with  improper specimen collection/handling, submission of specimen other than nasopharyngeal swab, presence of viral mutation(s) within the areas targeted by this assay, and inadequate number of viral copies(<138 copies/mL). A negative result must be combined with clinical observations, patient history, and epidemiological information. The expected result is Negative.  Fact Sheet for Patients:  EntrepreneurPulse.com.au  Fact Sheet for Healthcare Providers:  IncredibleEmployment.be  This test is no t yet approved or cleared by the [place] FDA and  has been authorized for detection and/or diagnosis of SARS-CoV-2 by FDA under an Emergency Use Authorization (EUA). This EUA will remain  in effect (meaning this test can be used) for the duration of the COVID-19 declaration under Section 564(b)(1) of the Act, 21 U.S.C.section 360bbb-3(b)(1), unless the authorization is terminated  or revoked sooner.       Influenza A by PCR NEGATIVE NEGATIVE Final   Influenza B by PCR NEGATIVE NEGATIVE Final    Comment: (NOTE) The Xpert Xpress SARS-CoV-2/FLU/RSV plus assay is intended as an aid in the  diagnosis of influenza from Nasopharyngeal swab specimens and should not be used as a sole basis for treatment. Nasal washings and aspirates are unacceptable for Xpert Xpress SARS-CoV-2/FLU/RSV testing.  Fact Sheet for Patients: EntrepreneurPulse.com.au  Fact Sheet for Healthcare Providers: IncredibleEmployment.be  This test is not yet approved or cleared by the [place] FDA and has been authorized for detection and/or diagnosis of SARS-CoV-2 by FDA under an Emergency Use Authorization (EUA). This EUA will remain in effect (meaning this test can be used) for the duration of the COVID-19 declaration under Section 564(b)(1) of the Act, 21 U.S.C. section 360bbb-3(b)(1), unless the authorization is terminated or revoked.  Performed at [facility], [place] [address], [place], [place] [address]      Labs: BNP (last 3 results) No results for input(s): BNP in the last 8760 hours. Basic Metabolic Panel: Recent Labs  Lab 07/05/21 0233 07/05/21 0420 07/06/21 0149  NA 137 140 138  K 4.0 3.8 3.7  CL 108 106 110  CO2 19* 24 24  GLUCOSE 197* 205* 147*  BUN 23 23 19   CREATININE 1.29* 1.27* 1.16  CALCIUM 8.3* 8.6* 7.9*   Liver Function Tests: Recent Labs  Lab 07/05/21 0233  AST 24  ALT 26  ALKPHOS 55  BILITOT 0.8  PROT 6.5  ALBUMIN 3.8   No results for input(s): LIPASE, AMYLASE in the last 168 hours. No results for input(s): AMMONIA in the last 168 hours. CBC: Recent Labs  Lab 07/05/21 0233 07/05/21 0420 07/06/21 0149  WBC 13.6*  11.6* 7.4  NEUTROABS 11.7*  --   --   HGB 15.6 15.5 12.5*  HCT 46.3 46.3 37.0*  MCV 94.7 94.7 95.6  PLT 194 169 147*   Cardiac Enzymes: No results for input(s): CKTOTAL, CKMB, CKMBINDEX, TROPONINI in the last 168 hours. BNP: Invalid input(s): POCBNP CBG: Recent Labs  Lab 07/05/21 1251 07/05/21 1711 07/05/21 2027 07/06/21 0731 07/06/21 1129  GLUCAP 111* 139* 155* 139* 113*    D-Dimer No results for input(s): DDIMER in the last 72 hours. Hgb A1c Recent Labs    07/05/21 0233  HGBA1C 8.1*   Lipid Profile No results for input(s): CHOL, HDL, LDLCALC, TRIG, CHOLHDL, LDLDIRECT in the last 72 hours. Thyroid function studies No results for input(s): TSH, T4TOTAL, T3FREE, THYROIDAB in the last 72 hours.  Invalid input(s): FREET3 Anemia work up No results for input(s): VITAMINB12, FOLATE, FERRITIN, TIBC, IRON, RETICCTPCT in the last 72 hours. Urinalysis    Component Value Date/Time   COLORURINE YELLOW 07/05/2021 0241   APPEARANCEUR CLEAR 07/05/2021 0241   LABSPEC 1.019 07/05/2021 0241   PHURINE 6.0 07/05/2021 0241   GLUCOSEU NEGATIVE 07/05/2021 0241   HGBUR NEGATIVE 07/05/2021 0241   BILIRUBINUR NEGATIVE 07/05/2021 0241   KETONESUR 5 (A) 07/05/2021 0241   PROTEINUR NEGATIVE 07/05/2021 0241   UROBILINOGEN 0.2 01/17/2013 0859   NITRITE NEGATIVE 07/05/2021 0241   LEUKOCYTESUR NEGATIVE 07/05/2021 0241   Sepsis Labs Invalid input(s): PROCALCITONIN,  WBC,  LACTICIDVEN Microbiology Recent Results (from the past 240 hour(s))  Urine Culture     Status: Abnormal   Collection Time: 07/05/21  2:16 AM   Specimen: In/Out Cath Urine  Result Value Ref Range Status   Specimen Description IN/OUT CATH URINE  Final   Special Requests   Final    NONE Performed at [facility], [address] [place], [place] [address]    Culture (A)  Final    20,000 COLONIES/mL MULTIPLE SPECIES PRESENT, SUGGEST RECOLLECTION   Report Status 07/06/2021 FINAL  Final  Blood Culture (routine x 2)     Status: None (Preliminary result)   Collection Time: 07/05/21  2:33 AM   Specimen: BLOOD  Result Value Ref Range Status   Specimen Description BLOOD RIGHT ANTECUBITAL  Final   Special Requests   Final    BOTTLES DRAWN AEROBIC AND ANAEROBIC Blood Culture adequate volume   Culture   Final    NO GROWTH 1 DAY Performed at [facility], [address] [place], [place]  [address]    Report Status PENDING  Incomplete  Blood Culture (routine x 2)     Status: None (Preliminary result)   Collection Time: 07/05/21  2:33 AM   Specimen: BLOOD LEFT HAND  Result Value Ref Range Status   Specimen Description BLOOD LEFT HAND  Final   Special Requests AEROBIC BOTTLE ONLY Blood Culture adequate volume  Final   Culture   Final    NO GROWTH 1 DAY Performed at [facility], [address] [place], [place] [address]    Report Status PENDING  Incomplete  Resp Panel by RT-PCR (Flu A&B, Covid) Nasopharyngeal Swab     Status: None   Collection Time: 07/05/21  2:48 AM   Specimen: Nasopharyngeal Swab; Nasopharyngeal(NP) swabs in vial transport medium  Result Value Ref Range Status   SARS Coronavirus 2 by RT PCR NEGATIVE NEGATIVE Final    Comment: (NOTE) SARS-CoV-2 target nucleic acids are NOT DETECTED.  The SARS-CoV-2 RNA is generally detectable in  upper respiratory specimens during the acute phase of infection. The lowest concentration of SARS-CoV-2 viral copies this assay can detect is 138 copies/mL. A negative result does not preclude SARS-Cov-2 infection and should not be used as the sole basis for treatment or other patient management decisions. A negative result may occur with  improper specimen collection/handling, submission of specimen other than nasopharyngeal swab, presence of viral mutation(s) within the areas targeted by this assay, and inadequate number of viral copies(<138 copies/mL). A negative result must be combined with clinical observations, patient history, and epidemiological information. The expected result is Negative.  Fact Sheet for Patients:  EntrepreneurPulse.com.au  Fact Sheet for Healthcare Providers:  IncredibleEmployment.be  This test is no t yet approved or cleared by the [place] FDA and  has been authorized for detection and/or diagnosis of SARS-CoV-2 by FDA under an Emergency Use  Authorization (EUA). This EUA will remain  in effect (meaning this test can be used) for the duration of the COVID-19 declaration under Section 564(b)(1) of the Act, 21 U.S.C.section 360bbb-3(b)(1), unless the authorization is terminated  or revoked sooner.       Influenza A by PCR NEGATIVE NEGATIVE Final   Influenza B by PCR NEGATIVE NEGATIVE Final    Comment: (NOTE) The Xpert Xpress SARS-CoV-2/FLU/RSV plus assay is intended as an aid in the diagnosis of influenza from Nasopharyngeal swab specimens and should not be used as a sole basis for treatment. Nasal washings and aspirates are unacceptable for Xpert Xpress SARS-CoV-2/FLU/RSV testing.  Fact Sheet for Patients: EntrepreneurPulse.com.au  Fact Sheet for Healthcare Providers: IncredibleEmployment.be  This test is not yet approved or cleared by the [place] FDA and has been authorized for detection and/or diagnosis of SARS-CoV-2 by FDA under an Emergency Use Authorization (EUA). This EUA will remain in effect (meaning this test can be used) for the duration of the COVID-19 declaration under Section 564(b)(1) of the Act, 21 U.S.C. section 360bbb-3(b)(1), unless the authorization is terminated or revoked.  Performed at [facility], [place] [address], [place], [place] [address]      Time coordinating discharge: Over 30 minutes  SIGNED:   Jess Toney J [place] DO  [facility] 07/06/2021, 12:11 PM

## 2021-07-10 LAB — CULTURE, BLOOD (ROUTINE X 2)
Culture: NO GROWTH
Culture: NO GROWTH
Special Requests: ADEQUATE
Special Requests: ADEQUATE

## 2021-07-15 DIAGNOSIS — N183 Chronic kidney disease, stage 3 unspecified: Secondary | ICD-10-CM | POA: Diagnosis not present

## 2021-07-15 DIAGNOSIS — I10 Essential (primary) hypertension: Secondary | ICD-10-CM | POA: Diagnosis not present

## 2021-07-15 DIAGNOSIS — Z09 Encounter for follow-up examination after completed treatment for conditions other than malignant neoplasm: Secondary | ICD-10-CM | POA: Diagnosis not present

## 2021-07-27 ENCOUNTER — Other Ambulatory Visit: Payer: Self-pay | Admitting: Internal Medicine

## 2021-07-29 NOTE — Telephone Encounter (Signed)
Prednisone refilled

## 2021-09-19 DIAGNOSIS — R1032 Left lower quadrant pain: Secondary | ICD-10-CM | POA: Diagnosis not present

## 2021-10-03 DIAGNOSIS — K219 Gastro-esophageal reflux disease without esophagitis: Secondary | ICD-10-CM | POA: Diagnosis not present

## 2021-10-03 DIAGNOSIS — M25552 Pain in left hip: Secondary | ICD-10-CM | POA: Diagnosis not present

## 2021-10-03 DIAGNOSIS — J449 Chronic obstructive pulmonary disease, unspecified: Secondary | ICD-10-CM | POA: Diagnosis not present

## 2021-10-03 DIAGNOSIS — E7800 Pure hypercholesterolemia, unspecified: Secondary | ICD-10-CM | POA: Diagnosis not present

## 2021-10-03 DIAGNOSIS — E1142 Type 2 diabetes mellitus with diabetic polyneuropathy: Secondary | ICD-10-CM | POA: Diagnosis not present

## 2021-10-03 DIAGNOSIS — N183 Chronic kidney disease, stage 3 unspecified: Secondary | ICD-10-CM | POA: Diagnosis not present

## 2021-10-03 DIAGNOSIS — I10 Essential (primary) hypertension: Secondary | ICD-10-CM | POA: Diagnosis not present

## 2021-10-06 ENCOUNTER — Other Ambulatory Visit: Payer: Self-pay | Admitting: Internal Medicine

## 2021-10-14 ENCOUNTER — Other Ambulatory Visit: Payer: Self-pay | Admitting: Internal Medicine

## 2021-10-15 DIAGNOSIS — M545 Low back pain, unspecified: Secondary | ICD-10-CM | POA: Diagnosis not present

## 2021-10-15 DIAGNOSIS — M25552 Pain in left hip: Secondary | ICD-10-CM | POA: Diagnosis not present

## 2021-10-16 DIAGNOSIS — M25552 Pain in left hip: Secondary | ICD-10-CM | POA: Diagnosis not present

## 2021-11-02 ENCOUNTER — Other Ambulatory Visit: Payer: Self-pay | Admitting: Internal Medicine

## 2021-11-02 DIAGNOSIS — I25118 Atherosclerotic heart disease of native coronary artery with other forms of angina pectoris: Secondary | ICD-10-CM

## 2021-12-03 ENCOUNTER — Other Ambulatory Visit: Payer: Self-pay | Admitting: Orthopedic Surgery

## 2021-12-03 DIAGNOSIS — M25552 Pain in left hip: Secondary | ICD-10-CM | POA: Diagnosis not present

## 2021-12-03 DIAGNOSIS — S72002A Fracture of unspecified part of neck of left femur, initial encounter for closed fracture: Secondary | ICD-10-CM

## 2021-12-04 ENCOUNTER — Encounter (HOSPITAL_BASED_OUTPATIENT_CLINIC_OR_DEPARTMENT_OTHER): Payer: Self-pay | Admitting: Emergency Medicine

## 2021-12-04 ENCOUNTER — Emergency Department (HOSPITAL_BASED_OUTPATIENT_CLINIC_OR_DEPARTMENT_OTHER)
Admission: EM | Admit: 2021-12-04 | Discharge: 2021-12-04 | Disposition: A | Discharge status: Home / Self Care | Payer: Medicare Other | Attending: Emergency Medicine | Admitting: Emergency Medicine

## 2021-12-04 ENCOUNTER — Emergency Department (HOSPITAL_BASED_OUTPATIENT_CLINIC_OR_DEPARTMENT_OTHER): Payer: Medicare Other

## 2021-12-04 DIAGNOSIS — E119 Type 2 diabetes mellitus without complications: Secondary | ICD-10-CM | POA: Insufficient documentation

## 2021-12-04 DIAGNOSIS — I10 Essential (primary) hypertension: Secondary | ICD-10-CM | POA: Diagnosis not present

## 2021-12-04 DIAGNOSIS — Z79899 Other long term (current) drug therapy: Secondary | ICD-10-CM | POA: Diagnosis not present

## 2021-12-04 DIAGNOSIS — M25552 Pain in left hip: Secondary | ICD-10-CM | POA: Insufficient documentation

## 2021-12-04 DIAGNOSIS — Z7982 Long term (current) use of aspirin: Secondary | ICD-10-CM | POA: Insufficient documentation

## 2021-12-04 DIAGNOSIS — Z96641 Presence of right artificial hip joint: Secondary | ICD-10-CM | POA: Insufficient documentation

## 2021-12-04 DIAGNOSIS — J449 Chronic obstructive pulmonary disease, unspecified: Secondary | ICD-10-CM | POA: Insufficient documentation

## 2021-12-04 DIAGNOSIS — R1032 Left lower quadrant pain: Secondary | ICD-10-CM | POA: Insufficient documentation

## 2021-12-04 MED ORDER — CYCLOBENZAPRINE HCL 5 MG PO TABS: 5.0000 mg | ORAL_TABLET | Freq: Two times a day (BID) | ORAL | 0 refills | Status: DC | PRN

## 2021-12-04 NOTE — ED Triage Notes (Signed)
Pt states he has been having pain in his left hip for over a month  Pt was seen by 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

## 2021-12-04 NOTE — ED Provider Notes (Signed)
[facility] EMERGENCY DEPARTMENT Provider Note   CSN: [id number] Arrival date & time: 12/04/21  1940     History  Chief Complaint  Patient presents with   Hip Pain    [name] [name] is a 76 y.o. male.  Patient here with left hip pain/groin pain.  History of right hip replacement in the past.  Has been having discomfort on and off for the last month.  He has been seen by orthopedics for this.  Did not think he was quite ready for hip replacement on the left yet.  Has a history of COPD, hypertension.  Movement makes it worse.  Tylenol makes it better.  Pain slightly worse tonight.  Denies any problems going to the bathroom.  No loss of bowel or bladder.  No fever.  No numbness, weakness, tingling.  Denies any scrotal pain or swelling.  Denies any bulges in his abdominal wall.  The history is provided by the patient.       Home Medications Prior to Admission medications   Medication Sig Start Date End Date Taking? Authorizing Provider  cyclobenzaprine (FLEXERIL) 5 MG tablet Take 1 tablet (5 mg total) by mouth 2 (two) times daily as needed for up to 20 doses for muscle spasms. 12/04/21  Yes Kendyn Zaman, DO  acetaminophen (TYLENOL) 650 MG CR tablet Take 1,300 mg daily as needed by mouth for pain.     [provider]  aspirin 81 MG tablet Take 81 mg by mouth daily.    [provider]  atorvastatin (LIPITOR) 80 MG tablet TAKE 1 TABLET BY MOUTH  DAILY 10/07/21   Chandrasekhar, Mahesh A, MD  CALCIUM-MAGNESIUM-ZINC PO Take 1 capsule by mouth daily.    [provider]  diltiazem (CARDIZEM SR) 120 MG 12 hr capsule Take 1 capsule by mouth once daily 10/15/21   Chandrasekhar, Mahesh A, MD  docusate sodium (COLACE) 100 MG capsule Take 100 mg by mouth 2 (two) times daily.    [provider]  ezetimibe (ZETIA) 10 MG tablet Take 1 tablet (10 mg total) by mouth daily. 02/28/21   Chandrasekhar, Mahesh A, MD  famotidine (PEPCID) 20 MG tablet TAKE ONE TABLET  BY MOUTH ONCE DAILY AT BEDTIME Patient taking differently: Take 20 mg by mouth at bedtime. 06/07/14   Nyoka Cowden, MD  furosemide (LASIX) 20 MG tablet Take 1 tablet by mouth once daily 11/04/21   Chandrasekhar, Mahesh A, MD  gabapentin (NEURONTIN) 300 MG capsule Take 300 mg by mouth 2 (two) times daily.    [provider]  glimepiride (AMARYL) 1 MG tablet Take 1 tablet by mouth daily. 07/27/14   [provider]  guaiFENesin (MUCINEX) 600 MG 12 hr tablet Take 1 tablet (600 mg total) by mouth 2 (two) times daily. 11/16/17   Jetty Duhamel D, MD  ipratropium-albuterol (DUONEB) 0.5-2.5 (3) MG/3ML SOLN Inhale 3 mLs into the lungs every 6 (six) hours as needed (wheezing or shortness of breath). 01/03/21   Jetty Duhamel D, MD  isosorbide mononitrate (IMDUR) 30 MG 24 hr tablet Take 1 tablet by mouth once daily 11/04/21   Riley Lam A, MD  levocetirizine (XYZAL) 5 MG tablet Take 5 mg at bedtime by mouth.  07/25/14   [provider]  montelukast (SINGULAIR) 10 MG tablet Take 10 mg at bedtime by mouth.  07/20/14   [provider]  Multiple Vitamin (MULTIVITAMIN WITH MINERALS) TABS tablet Take 1 tablet by mouth daily.    [provider]  nitroGLYCERIN (NITROSTAT) 0.4 MG SL tablet Place 1 tablet (0.4 mg total) under the tongue every 5 (five) minutes as needed for chest pain. 11/15/20 07/05/21  Werner Lean, MD  olmesartan (BENICAR) 20 MG tablet Take 20 mg daily by mouth.    [provider]  pantoprazole (PROTONIX) 40 MG tablet TAKE ONE TABLET BY MOUTH ONCE DAILY TAKE  30-60  MINUTES  BEFORE  FIRST  MEAL  OF  THE  DAY Patient taking differently: Take 40 mg by mouth daily. 06/07/14   Tanda Rockers, MD  predniSONE (DELTASONE) 10 MG tablet TAKE 1 TABLET BY MOUTH EVERY OTHER DAY 07/29/21   Deneise Lever, MD  PROAIR HFA 108 (90 BASE) MCG/ACT inhaler Inhale 2 puffs into the lungs 4 (four) times daily as needed for wheezing or shortness of breath.  07/27/14   [provider]  Respiratory Therapy Supplies (FLUTTER) DEVI Blow through 4 times per set, 3 sets per day when needed to clear lungs 02/11/16   Baird Lyons D, MD  TRELEGY ELLIPTA 100-62.5-25 MCG/ACT AEPB USE 1 INHALATION BY MOUTH  DAILY Patient taking differently: Inhale 1 puff into the lungs daily. 03/27/21   Deneise Lever, MD      Allergies    Penicillins, Lopressor [metoprolol tartrate], and Codeine    Review of Systems   Review of Systems  Physical Exam Updated Vital Signs BP (!) 170/100 (BP Location: Right Arm)   Pulse 63   Temp 97.9 F (36.6 C) (Oral)   Resp 18   Ht 6' (1.829 m)   Wt 96.2 kg   SpO2 100%   BMI 28.75 kg/m  Physical Exam Vitals and nursing note reviewed.  Constitutional:      General: He is not in acute distress.    Appearance: He is well-developed. He is not ill-appearing.  HENT:     Head: Normocephalic and atraumatic.     Nose: Nose normal.     Mouth/Throat:     Mouth: Mucous membranes are moist.  Eyes:     Extraocular Movements: Extraocular movements intact.     Conjunctiva/sclera: Conjunctivae normal.     Pupils: Pupils are equal, round, and reactive to light.  Cardiovascular:     Rate and Rhythm: Normal rate and regular rhythm.     Pulses: Normal pulses.     Heart sounds: No murmur heard. Pulmonary:     Effort: Pulmonary effort is normal. No respiratory distress.     Breath sounds: Normal breath sounds.  Abdominal:     Palpations: Abdomen is soft.     Tenderness: There is no abdominal tenderness.  Musculoskeletal:        General: Tenderness present. No swelling. Normal range of motion.     Cervical back: Normal range of motion and neck supple.     Comments: Tender mostly in the left hip flexor/inguinal area, there is no obvious hernia, there is no scrotal swelling  Skin:    General: Skin is warm and dry.     Capillary Refill: Capillary refill takes less than 2 seconds.  Neurological:     General: No focal deficit  present.     Mental Status: He is alert.     Sensory: No sensory deficit.     Motor: No weakness.  Psychiatric:        Mood and Affect: Mood normal.     ED Results / Procedures / Treatments   Labs (all labs ordered are listed, but only abnormal results are  displayed) Labs Reviewed - No data to display  EKG None  Radiology DG Hip Unilat With Pelvis 2-3 Views Left  Result Date: 12/04/2021 CLINICAL DATA:  Left hip pain for over a month. EXAM: DG HIP (WITH OR WITHOUT PELVIS) 2-3V LEFT COMPARISON:  09/19/2021 FINDINGS: Postoperative changes in the lower lumbar spine. Postoperative right total hip arthroplasty. Long stem femoral component is not completely included within the field of view. Visualized components appear intact. Degenerative changes are present in the left hip with narrowing and sclerosis of the superior acetabulum and mild osteophyte formation on both sides of the joint. No evidence of acute fracture or dislocation. No focal bone lesion or bone destruction. Bone cortex appears intact. Pelvis appears intact. SI joints and symphysis pubis are nondisplaced. Calcified phleboliths in the pelvis. IMPRESSION: 1. Degenerative changes in the left hip. No acute displaced fractures identified. 2. Right hip arthroplasty. Electronically Signed   By: Burman Nieves M.D.   On: 12/04/2021 20:13    Procedures Procedures    Medications Ordered in ED Medications - No data to display  ED Course/ Medical Decision Making/ A&P                           Medical Decision Making Amount and/or Complexity of Data Reviewed Radiology: ordered.  Risk Prescription drug management.   Erick Colace is here with left groin/hip pain.  History of right hip replacement, chronic back pain, diabetes, hypertension, COPD.  Overall differential diagnosis is likely muscle strain/pull/hip flexor issue on the left.  We will get x-ray to evaluate for any fracture.  He does not appear to have a hernia on exam.   His GU exam is unremarkable.  There is no obvious mass in his left lower abdomen.  He has no symptoms of cauda equina.  There is no midline spinal tenderness.  He is got good pulses in his lower extremities.  No concern for DVT or peripheral arterial disease.  X-ray per my review interpretation showed no fracture.  Ultimately I think this is a soft tissue problem.  He has been taking Tylenol.  He does not want to take narcotic pain medicine.  We will prescribe him a muscle relaxant and have him follow-up with his orthopedic doctor.  Discharged in good condition.  This chart was dictated using voice recognition software.  Despite best efforts to proofread,  errors can occur which can change the documentation meaning.         Final Clinical Impression(s) / ED Diagnoses Final diagnoses:  Left groin pain    Rx / DC Orders ED Discharge Orders          Ordered    cyclobenzaprine (FLEXERIL) 5 MG tablet  2 times daily PRN        12/04/21 2016              Virgina Norfolk, DO 12/04/21 2017

## 2021-12-04 NOTE — Discharge Instructions (Signed)
X-ray overall shows arthritis.  I suspect that this is more of a hip flexor strain/muscle strain.  I have prescribed you a muscle relaxant as we discussed to help with your discomfort.  Continue Tylenol.  Follow-up with your orthopedic doctor.

## 2021-12-05 DIAGNOSIS — H35032 Hypertensive retinopathy, left eye: Secondary | ICD-10-CM | POA: Diagnosis not present

## 2021-12-05 DIAGNOSIS — E113293 Type 2 diabetes mellitus with mild nonproliferative diabetic retinopathy without macular edema, bilateral: Secondary | ICD-10-CM | POA: Diagnosis not present

## 2021-12-05 DIAGNOSIS — H04123 Dry eye syndrome of bilateral lacrimal glands: Secondary | ICD-10-CM | POA: Diagnosis not present

## 2021-12-05 DIAGNOSIS — Z961 Presence of intraocular lens: Secondary | ICD-10-CM | POA: Diagnosis not present

## 2021-12-05 DIAGNOSIS — H524 Presbyopia: Secondary | ICD-10-CM | POA: Diagnosis not present

## 2021-12-12 DIAGNOSIS — R1032 Left lower quadrant pain: Secondary | ICD-10-CM | POA: Diagnosis not present

## 2021-12-13 ENCOUNTER — Other Ambulatory Visit (HOSPITAL_COMMUNITY): Payer: Self-pay | Admitting: Orthopedic Surgery

## 2021-12-13 DIAGNOSIS — I493 Ventricular premature depolarization: Secondary | ICD-10-CM | POA: Insufficient documentation

## 2021-12-13 DIAGNOSIS — S72002A Fracture of unspecified part of neck of left femur, initial encounter for closed fracture: Secondary | ICD-10-CM

## 2021-12-13 DIAGNOSIS — I471 Supraventricular tachycardia: Secondary | ICD-10-CM | POA: Insufficient documentation

## 2021-12-16 ENCOUNTER — Encounter: Payer: Medicare Other | Admitting: Internal Medicine

## 2021-12-16 ENCOUNTER — Emergency Department (HOSPITAL_BASED_OUTPATIENT_CLINIC_OR_DEPARTMENT_OTHER): Payer: Medicare Other

## 2021-12-16 ENCOUNTER — Emergency Department (HOSPITAL_BASED_OUTPATIENT_CLINIC_OR_DEPARTMENT_OTHER)
Admission: EM | Admit: 2021-12-16 | Discharge: 2021-12-17 | Disposition: A | Discharge status: Home / Self Care | Payer: Medicare Other | Attending: Emergency Medicine | Admitting: Emergency Medicine

## 2021-12-16 ENCOUNTER — Other Ambulatory Visit: Payer: Self-pay

## 2021-12-16 ENCOUNTER — Encounter (HOSPITAL_BASED_OUTPATIENT_CLINIC_OR_DEPARTMENT_OTHER): Payer: Self-pay | Admitting: Emergency Medicine

## 2021-12-16 DIAGNOSIS — J449 Chronic obstructive pulmonary disease, unspecified: Secondary | ICD-10-CM | POA: Insufficient documentation

## 2021-12-16 DIAGNOSIS — I493 Ventricular premature depolarization: Secondary | ICD-10-CM

## 2021-12-16 DIAGNOSIS — Z7982 Long term (current) use of aspirin: Secondary | ICD-10-CM | POA: Insufficient documentation

## 2021-12-16 DIAGNOSIS — Z7951 Long term (current) use of inhaled steroids: Secondary | ICD-10-CM | POA: Diagnosis not present

## 2021-12-16 DIAGNOSIS — R197 Diarrhea, unspecified: Secondary | ICD-10-CM | POA: Diagnosis not present

## 2021-12-16 DIAGNOSIS — I471 Supraventricular tachycardia: Secondary | ICD-10-CM

## 2021-12-16 DIAGNOSIS — K5000 Crohn's disease of small intestine without complications: Secondary | ICD-10-CM | POA: Diagnosis not present

## 2021-12-16 DIAGNOSIS — R0602 Shortness of breath: Secondary | ICD-10-CM | POA: Insufficient documentation

## 2021-12-16 DIAGNOSIS — I495 Sick sinus syndrome: Secondary | ICD-10-CM

## 2021-12-16 DIAGNOSIS — Z20822 Contact with and (suspected) exposure to covid-19: Secondary | ICD-10-CM | POA: Insufficient documentation

## 2021-12-16 DIAGNOSIS — A09 Infectious gastroenteritis and colitis, unspecified: Secondary | ICD-10-CM | POA: Insufficient documentation

## 2021-12-16 DIAGNOSIS — K8020 Calculus of gallbladder without cholecystitis without obstruction: Secondary | ICD-10-CM | POA: Diagnosis not present

## 2021-12-16 DIAGNOSIS — D72829 Elevated white blood cell count, unspecified: Secondary | ICD-10-CM | POA: Insufficient documentation

## 2021-12-16 DIAGNOSIS — I509 Heart failure, unspecified: Secondary | ICD-10-CM | POA: Insufficient documentation

## 2021-12-16 DIAGNOSIS — Z950 Presence of cardiac pacemaker: Secondary | ICD-10-CM

## 2021-12-16 LAB — SARS CORONAVIRUS 2 BY RT PCR: SARS Coronavirus 2 by RT PCR: NEGATIVE

## 2021-12-16 NOTE — ED Triage Notes (Signed)
Patient reports cough, shortness of breath, headache, chills, and fever since this morning. States he feels like he has pneumonia. Report fever of 101 pta and took tylenol.

## 2021-12-16 NOTE — ED Provider Notes (Signed)
[facility] EMERGENCY DEPARTMENT  Provider Note  CSN: [id number] Arrival date & time: 12/16/21 1953  History Chief Complaint  Patient presents with   Diarrhea    [name] [name] is a 76 y.o. male with COPD/chronic cough. He reports he has had multiple episodes of watery, non-bloody diarrhea at home today and then began running a fever this afternoon. He states his fever was similar to last time he had a pneumonia. He has not had a change in his cough today. He took some APAP prior to arrival.    Home Medications Prior to Admission medications   Medication Sig Start Date End Date Taking? Authorizing Provider  azithromycin (ZITHROMAX) 500 MG tablet Take 1 tablet (500 mg total) by mouth daily for 7 days. Take first 2 tablets together, then 1 every day until finished. 12/17/21 12/24/21 Yes Pollyann Savoy, MD  acetaminophen (TYLENOL) 650 MG CR tablet Take 1,300 mg daily as needed by mouth for pain.     [provider]  aspirin 81 MG tablet Take 81 mg by mouth daily.    [provider]  atorvastatin (LIPITOR) 80 MG tablet TAKE 1 TABLET BY MOUTH  DAILY 10/07/21   Chandrasekhar, Mahesh A, MD  CALCIUM-MAGNESIUM-ZINC PO Take 1 capsule by mouth daily.    [provider]  cyclobenzaprine (FLEXERIL) 5 MG tablet Take 1 tablet (5 mg total) by mouth 2 (two) times daily as needed for up to 20 doses for muscle spasms. 12/04/21   Curatolo, Adam, DO  diltiazem (CARDIZEM SR) 120 MG 12 hr capsule Take 1 capsule by mouth once daily 10/15/21   Chandrasekhar, Mahesh A, MD  docusate sodium (COLACE) 100 MG capsule Take 100 mg by mouth 2 (two) times daily.    [provider]  ezetimibe (ZETIA) 10 MG tablet Take 1 tablet (10 mg total) by mouth daily. 02/28/21   Chandrasekhar, Mahesh A, MD  famotidine (PEPCID) 20 MG tablet TAKE ONE TABLET BY MOUTH ONCE DAILY AT BEDTIME Patient taking differently: Take 20 mg by mouth at bedtime. 06/07/14   Nyoka Cowden, MD  furosemide  (LASIX) 20 MG tablet Take 1 tablet by mouth once daily 11/04/21   Chandrasekhar, Mahesh A, MD  gabapentin (NEURONTIN) 300 MG capsule Take 300 mg by mouth 2 (two) times daily.    [provider]  glimepiride (AMARYL) 1 MG tablet Take 1 tablet by mouth daily. 07/27/14   [provider]  guaiFENesin (MUCINEX) 600 MG 12 hr tablet Take 1 tablet (600 mg total) by mouth 2 (two) times daily. 11/16/17   Jetty Duhamel D, MD  ipratropium-albuterol (DUONEB) 0.5-2.5 (3) MG/3ML SOLN Inhale 3 mLs into the lungs every 6 (six) hours as needed (wheezing or shortness of breath). 01/03/21   Jetty Duhamel D, MD  isosorbide mononitrate (IMDUR) 30 MG 24 hr tablet Take 1 tablet by mouth once daily 11/04/21   Riley Lam A, MD  levocetirizine (XYZAL) 5 MG tablet Take 5 mg at bedtime by mouth.  07/25/14   [provider]  montelukast (SINGULAIR) 10 MG tablet Take 10 mg at bedtime by mouth.  07/20/14   [provider]  Multiple Vitamin (MULTIVITAMIN WITH MINERALS) TABS tablet Take 1 tablet by mouth daily.    [provider]  nitroGLYCERIN (NITROSTAT) 0.4 MG SL tablet Place 1 tablet (0.4 mg total) under the tongue every 5 (five) minutes as needed for chest pain. 11/15/20 07/05/21  Christell Constant, MD  olmesartan (BENICAR) 20 MG tablet Take  20 mg daily by mouth.    [provider]  pantoprazole (PROTONIX) 40 MG tablet TAKE ONE TABLET BY MOUTH ONCE DAILY TAKE  30-60  MINUTES  BEFORE  FIRST  MEAL  OF  THE  DAY Patient taking differently: Take 40 mg by mouth daily. 06/07/14   Nyoka Cowden, MD  predniSONE (DELTASONE) 10 MG tablet TAKE 1 TABLET BY MOUTH EVERY OTHER DAY 07/29/21   Waymon Budge, MD  PROAIR HFA 108 (90 BASE) MCG/ACT inhaler Inhale 2 puffs into the lungs 4 (four) times daily as needed for wheezing or shortness of breath. 07/27/14   [provider]  Respiratory Therapy Supplies (FLUTTER) DEVI Blow through 4 times per set, 3 sets per day when needed to  clear lungs 02/11/16   Jetty Duhamel D, MD  TRELEGY ELLIPTA 100-62.5-25 MCG/ACT AEPB USE 1 INHALATION BY MOUTH  DAILY Patient taking differently: Inhale 1 puff into the lungs daily. 03/27/21   Waymon Budge, MD     Allergies    Penicillins, Lopressor [metoprolol tartrate], and Codeine   Review of Systems   Review of Systems Please see HPI for pertinent positives and negatives  Physical Exam BP 134/78 (BP Location: Left Arm)   Pulse 80   Temp 99.8 F (37.7 C) (Oral)   Resp 13   Ht 6' (1.829 m)   Wt 96 kg   SpO2 96%   BMI 28.70 kg/m   Physical Exam Vitals and nursing note reviewed.  Constitutional:      Appearance: Normal appearance.  HENT:     Head: Normocephalic and atraumatic.     Nose: Nose normal.     Mouth/Throat:     Mouth: Mucous membranes are moist.  Eyes:     Extraocular Movements: Extraocular movements intact.     Conjunctiva/sclera: Conjunctivae normal.  Cardiovascular:     Rate and Rhythm: Normal rate.  Pulmonary:     Effort: Pulmonary effort is normal.     Breath sounds: Normal breath sounds.  Abdominal:     General: Abdomen is flat.     Palpations: Abdomen is soft.     Tenderness: There is no abdominal tenderness.  Musculoskeletal:        General: No swelling. Normal range of motion.     Cervical back: Neck supple.  Skin:    General: Skin is warm and dry.  Neurological:     General: No focal deficit present.     Mental Status: He is alert.  Psychiatric:        Mood and Affect: Mood normal.     ED Results / Procedures / Treatments   EKG None  Procedures Procedures  Medications Ordered in the ED Medications  azithromycin (ZITHROMAX) tablet 500 mg (has no administration in time range)  iohexol (OMNIPAQUE) 300 MG/ML solution 100 mL (100 mLs Intravenous Contrast Given 12/17/21 0030)    Initial Impression and Plan  Patient here for fever and diarrhea. Cough is not a particular concern for him today other than to say the last time he  ran a fever like this was when he had pneumonia. He had a neg Covid test in triage. I personally viewed the images from radiology studies and agree with radiologist interpretation: CXR is clear. Plan to add labs, including cultures, and send for CT to evaluate his diarrhea. No other signs of sepsis now.    ED Course   Clinical Course as of 12/17/21 0226  Tue Dec 17, 2021  0021 CBC  with mild leukocytosis. CMP with CKD about at baseline.  [CS]  0032 Trop is neg.  [CS]  0051 BNP is normal.  [CS]  0222 Repeat trop is normal. I personally viewed the images from radiology studies and agree with radiologist interpretation: CT consistent with colitis. He continues to have multiple episodes of watery stool. Will treat empirically for infectious diarrhea. Recommend PCP follow up, encouraged oral hydration. RTED for any other concerns.   [CS]    Clinical Course User Index [CS] Pollyann Savoy, MD     MDM Rules/Calculators/A&P Medical Decision Making Given presenting complaint, I considered that admission might be necessary. After review of results from ED lab and/or imaging studies, admission to the hospital is not indicated at this time.    Problems Addressed: Infectious diarrhea: acute illness or injury  Amount and/or Complexity of Data Reviewed Labs: ordered. Decision-making details documented in ED Course. Radiology: ordered and independent interpretation performed. Decision-making details documented in ED Course.  Risk Prescription drug management. Decision regarding hospitalization.    Final Clinical Impression(s) / ED Diagnoses Final diagnoses:  Infectious diarrhea    Rx / DC Orders ED Discharge Orders          Ordered    azithromycin (ZITHROMAX) 500 MG tablet  Daily        12/17/21 0225             Pollyann Savoy, MD 12/17/21 [id number]

## 2021-12-17 ENCOUNTER — Emergency Department (HOSPITAL_BASED_OUTPATIENT_CLINIC_OR_DEPARTMENT_OTHER): Payer: Medicare Other

## 2021-12-17 ENCOUNTER — Telehealth (HOSPITAL_BASED_OUTPATIENT_CLINIC_OR_DEPARTMENT_OTHER): Payer: Self-pay | Admitting: Emergency Medicine

## 2021-12-17 DIAGNOSIS — K8020 Calculus of gallbladder without cholecystitis without obstruction: Secondary | ICD-10-CM | POA: Diagnosis not present

## 2021-12-17 DIAGNOSIS — K5000 Crohn's disease of small intestine without complications: Secondary | ICD-10-CM | POA: Diagnosis not present

## 2021-12-17 LAB — COMPREHENSIVE METABOLIC PANEL
ALT: 20 U/L (ref 0–44)
AST: 22 U/L (ref 15–41)
Albumin: 4.2 g/dL (ref 3.5–5.0)
Alkaline Phosphatase: 43 U/L (ref 38–126)
Anion gap: 10 (ref 5–15)
BUN: 15 mg/dL (ref 8–23)
CO2: 23 mmol/L (ref 22–32)
Calcium: 8.5 mg/dL — ABNORMAL LOW (ref 8.9–10.3)
Chloride: 104 mmol/L (ref 98–111)
Creatinine, Ser: 1.37 mg/dL — ABNORMAL HIGH (ref 0.61–1.24)
GFR, Estimated: 53 mL/min — ABNORMAL LOW (ref >60)
Glucose, Bld: 131 mg/dL — ABNORMAL HIGH (ref 70–99)
Potassium: 3.4 mmol/L — ABNORMAL LOW (ref 3.5–5.1)
Sodium: 137 mmol/L (ref 135–145)
Total Bilirubin: 1 mg/dL (ref 0.3–1.2)
Total Protein: 7.5 g/dL (ref 6.5–8.1)

## 2021-12-17 LAB — CBC WITH DIFFERENTIAL/PLATELET
Abs Immature Granulocytes: 0.08 10*3/uL — ABNORMAL HIGH (ref 0.00–0.07)
Basophils Absolute: 0 10*3/uL (ref 0.0–0.1)
Basophils Relative: 0 %
Eosinophils Absolute: 0 10*3/uL (ref 0.0–0.5)
Eosinophils Relative: 0 %
HCT: 43 % (ref 39.0–52.0)
Hemoglobin: 14.7 g/dL (ref 13.0–17.0)
Immature Granulocytes: 1 %
Lymphocytes Relative: 8 %
Lymphs Abs: 1.1 10*3/uL (ref 0.7–4.0)
MCH: 32.7 pg (ref 26.0–34.0)
MCHC: 34.2 g/dL (ref 30.0–36.0)
MCV: 95.6 fL (ref 80.0–100.0)
Monocytes Absolute: 1.2 10*3/uL — ABNORMAL HIGH (ref 0.1–1.0)
Monocytes Relative: 8 %
Neutro Abs: 12.7 10*3/uL — ABNORMAL HIGH (ref 1.7–7.7)
Neutrophils Relative %: 83 %
Platelets: 168 10*3/uL (ref 150–400)
RBC: 4.5 MIL/uL (ref 4.22–5.81)
RDW: 13.3 % (ref 11.5–15.5)
WBC: 15.2 10*3/uL — ABNORMAL HIGH (ref 4.0–10.5)
nRBC: 0 % (ref 0.0–0.2)

## 2021-12-17 LAB — BRAIN NATRIURETIC PEPTIDE: B Natriuretic Peptide: 65 pg/mL (ref 0.0–100.0)

## 2021-12-17 LAB — TROPONIN I (HIGH SENSITIVITY)
Troponin I (High Sensitivity): 13 ng/L (ref <18)
Troponin I (High Sensitivity): 13 ng/L (ref <18)

## 2021-12-17 MED ORDER — AZITHROMYCIN 500 MG PO TABS: 500.0000 mg | ORAL_TABLET | Freq: Every day | ORAL | 0 refills | Status: AC

## 2021-12-17 MED ORDER — AZITHROMYCIN 250 MG PO TABS
500.0000 mg | ORAL_TABLET | Freq: Once | ORAL | Status: AC
  Administered 2021-12-17: 500 mg via ORAL
  Filled 2021-12-17: qty 2

## 2021-12-17 MED ORDER — IOHEXOL 300 MG/ML  SOLN
100.0000 mL | Freq: Once | INTRAMUSCULAR | Status: AC | PRN
  Administered 2021-12-17: 100 mL via INTRAVENOUS

## 2021-12-17 NOTE — ED Notes (Signed)
Patient transported to CT 

## 2021-12-20 ENCOUNTER — Ambulatory Visit (HOSPITAL_COMMUNITY)
Admission: RE | Admit: 2021-12-20 | Discharge: 2021-12-20 | Disposition: A | Discharge status: Home / Self Care | Payer: Medicare Other | Source: Ambulatory Visit | Attending: Orthopedic Surgery | Admitting: Orthopedic Surgery

## 2021-12-20 ENCOUNTER — Encounter (HOSPITAL_COMMUNITY): Payer: Self-pay

## 2021-12-20 DIAGNOSIS — M25552 Pain in left hip: Secondary | ICD-10-CM | POA: Diagnosis not present

## 2021-12-20 DIAGNOSIS — S72002A Fracture of unspecified part of neck of left femur, initial encounter for closed fracture: Secondary | ICD-10-CM | POA: Diagnosis not present

## 2021-12-22 LAB — CULTURE, BLOOD (ROUTINE X 2)
Culture: NO GROWTH
Culture: NO GROWTH
Special Requests: ADEQUATE

## 2021-12-26 ENCOUNTER — Other Ambulatory Visit: Payer: Medicare Other

## 2022-01-01 NOTE — Progress Notes (Signed)
HPI  M former smoker with COPD, chronic cough, OSA complicated by Cardiac dysrhythmia/ pacemaker, HBP  Office Spirometry 08/11/14-moderate obstruction/restriction-FVC 2.95/62%, FEV1 2.37/65%, ratio 0.79, FEF 25-75% 2.43/77% PFT 10/26/2017-normal flows without response to bronchodilator, moderate diffusion defect. Walk Test O2 Qualifying-04/26/2018-qualified for portable oxygen-during his second lap heart rate reached 112 and saturation fell to 86% on room air Labs 04/26/2018- IgE 464, EOS wnl HST 05/17/18>> Severe obstructive sleep apnea AHI:33 CPAP to BIPAP titration 02/18/2019-  BIPAP 10/6, if not tolerated try CPAP 7  -----------------------------------------------------------------------------------------   01/03/21- 76 year old male former smoker followed for COPD, restrictive lung disease, cough, OSA, complicated by CAD, CHF, cardiac arrhythmia/Pacemaker/ ICD, HBP, DM  BIPAP 10/6/ [employer] 10 S machine -Trelegy 100, Proair hfa, prednisone 10 mg QOD, Singulair, neb Duoneb, Download- compliance 67%, AHI 3.7/ hr Body weight today-220 lbs Covid vax-3 Moderna -----Having a cough with brownish/yellowish sputum. Muscle aches and pains affect sleep, but otherwise feels pretty well. Incidental occasional cough with sometimes dark sputum. Denies cough today and doesn't feel he needs to do anything. He pleads "too many appointments" and declines referral for mask fitting now.   01/03/22- 76 year old male former smoker followed for COPD, restrictive lung disease, cough, OSA, complicated by CAD, CHF, cardiac arrhythmia/Pacemaker/ ICD, HBP, DM  BIPAP 10/6/ [employer] 10 S machine -Trelegy 100, Proair hfa, prednisone 10 mg QOD, Singulair, neb Duoneb, Download- compliance 53%, AHI 6.1/ hr Body weight today-215 lbs Covid vax-3 Moderna ED 8/14- fever, diarrhea> zithromax -----Pt f/u for OSA - BiPAP is working well, pt states he's been dealing w/ some sickness and  hip pain which has cut back his sleep/usage.  Breathing comfortable with no recent issues. Sleep and BiPAP use impacted by illness this summer. Colitis may be improved. Bothersome L hip pain now with hx R THA. CXR 12/16/21- IMPRESSION: 1. No acute cardiopulmonary process. 2. Stable cardiomegaly.   ROS-see HPI   + = positive Constitutional:    weight loss, night sweats, fevers, chills, fatigue, lassitude. HEENT:    headaches, difficulty swallowing, tooth/dental problems, sore throat,       sneezing, itching, ear ache, +nasal congestion, +post nasal drip, snoring CV:    +chest pain, orthopnea, PND, swelling in lower extremities, anasarca,                     dizziness, palpitations Resp:   +shortness of breath with exertion or at rest.                +productive cough,   + non-productive cough, coughing up of blood.              change in color of mucus.  +wheezing.   Skin:    rash or lesions. GI:  No-   heartburn, indigestion, abdominal pain, nausea, vomiting, GU:  MS:   joint pain, stiffness,  Neuro-     nothing unusual Psych:  change in mood or affect.  depression or anxiety.   memory loss.  OBJ- Physical Exam General- Alert, Oriented, Affect-appropriate, Distress- none acute,  Skin- rash-none, lesions- none, excoriation- none Lymphadenopathy- none Head- atraumatic            Eyes- Gross vision intact, PERRLA, conjunctivae and secretions clear            Ears- Hearing, canals-normal            Nose- Clear, no-Septal dev, mucus, polyps, erosion, perforation  Throat- Mallampati II , mucosa clear , drainage-none, tonsils- atrophic, edentulous+, not hoarse Neck- flexible , trachea midline, no stridor , thyroid nl, carotid no bruit Chest - symmetrical excursion , unlabored           Heart/CV- RRR , no murmur , no gallop  , no rub, nl s1 s2                           - JVD- none , edema- none, stasis changes- none, varices- none           Lung- +clear, cough-none,  dullness-none, rub- none,            Chest wall- +L pacemaker Abd-  Br/ Gen/ Rectal- Not done, not indicated Extrem- cyanosis- none, clubbing, none, atrophy- none, strength- nl, +cane Neuro- grossly intact to observation

## 2022-01-02 ENCOUNTER — Encounter: Payer: Self-pay | Admitting: Internal Medicine

## 2022-01-03 ENCOUNTER — Ambulatory Visit (INDEPENDENT_AMBULATORY_CARE_PROVIDER_SITE_OTHER): Payer: Medicare Other | Admitting: Internal Medicine

## 2022-01-03 ENCOUNTER — Encounter: Payer: Self-pay | Admitting: Internal Medicine

## 2022-01-03 DIAGNOSIS — G4733 Obstructive sleep apnea (adult) (pediatric): Secondary | ICD-10-CM

## 2022-01-03 DIAGNOSIS — J449 Chronic obstructive pulmonary disease, unspecified: Secondary | ICD-10-CM

## 2022-01-03 NOTE — Assessment & Plan Note (Signed)
Dealing with somatic discomfort from bowel and hip problems leading to short sleep. Plan- we will leave current settings and hope he can sleep better soon.

## 2022-01-03 NOTE — Assessment & Plan Note (Signed)
Current meds have been appropriate Plan- no changes.

## 2022-01-03 NOTE — Patient Instructions (Signed)
We can continue BIPAP 10/6  I hope your GI and hip problems can get better quickly

## 2022-01-07 NOTE — Progress Notes (Unsigned)
Cardiology Office Note:    Date:  01/08/2022   ID:  [name] [name], DOB 04-Dec-1945, MRN [id number]  PCP:  Shirline Frees, MD  [facility] Cardiologist:  Rudean Haskell MD [place] Electrophysiologist:  Virl Axe, MD   CC:  Follow up CAD  History of Present Illness:    [name] [name] is a 76 y.o. male with a hx of DM with HTN, HLD, Non obstructive CAD, COPD, SSS s/p Medtronic PPM, AT seen by Dr. Caryl Comes, CKD. Who presents for evaluation.   Seem by NP Phylliss Bob 01/2019 and received CCTA for CP.  2022:  patient diuresed 15 lbs with improvement in his SOB.  Saw Dr. Caryl Comes, but has leg swelling:  Stopped his thiazide diuretic.  Questions about volumes status, breathing, and CP lead to re-evaluation. Had uncomplicated eye surgery, patient had normal Stress (LV reported low EF) and subsequent normal EF by echo.  Had deferred aggressive heart attack prevention. 2023: Three ED visits for sepsis and AKI, diarrhea, and groin pain.  Patient notes that he is doing ok.   Since last visit notes that he is still having left groin pain  that runs down the leg.  Worse at night.  No change with activity. Diarrhea is now resolved.  Rare Chest pain; since seeing me last has used nitro twice; none recently. .  No SOB/DOE and no PND/Orthopnea.  No weight gain or leg swelling.  No palpitations or syncope.  Past Medical History:  Diagnosis Date   Arthritis    Bradycardia    pacemaker - Medtronic Adapta #ADDRO1 - December 22, 2005   Carpal tunnel syndrome on both sides    Carpal tunnel syndrome, bilateral    Chronic back pain    Chronic kidney disease    COPD (chronic obstructive pulmonary disease) ([place])    Depression    Diabetes mellitus    type II controled with diet   GERD (gastroesophageal reflux disease)    Headache(784.0)    Hyperlipidemia    Hypertension    Hypertensive retinopathy    Insomnia    Neuromuscular disorder ([place])    peripheral neuropathy BLE   Pneumonia    Presence  of permanent cardiac pacemaker    Wears glasses     Past Surgical History:  Procedure Laterality Date   BACK SURGERY     CARPAL TUNNEL RELEASE Right 03/24/2017   Procedure: Right CARPAL TUNNEL RELEASE;  Surgeon: Erline Levine, MD;  Location: [place];  Service: Neurosurgery;  Laterality: Right;  Right CARPAL TUNNEL RELEASE   CATARACT EXTRACTION     EP IMPLANTABLE DEVICE N/A 06/27/2015   Procedure:  PPM Generator Changeout;  Surgeon: Deboraha Sprang, MD;  Location: [place] [facility];  Service: Cardiovascular;  Laterality: N/A;   EYE SURGERY     HEMORRHOID SURGERY     INSERT / REPLACE / REMOVE PACEMAKER     MULTIPLE TOOTH EXTRACTIONS     PACEMAKER INSERTION  ?2007   TOTAL HIP ARTHROPLASTY Right 01/25/2013   Procedure: TOTAL HIP ARTHROPLASTY ANTERIOR APPROACH;  Surgeon: Hessie Dibble, MD;  Location: [place];  Service: Orthopedics;  Laterality: Right;   ULNAR NERVE TRANSPOSITION Right 03/24/2017   Procedure: Right Ulnar nerve release;  Surgeon: Erline Levine, MD;  Location: [place];  Service: Neurosurgery;  Laterality: Right;  Right Ulnar nerve release    Current Medications: Current Meds  Medication Sig   acetaminophen (TYLENOL) 650 MG CR tablet Take 1,300 mg daily as needed by mouth for  pain.    aspirin 81 MG tablet Take 81 mg by mouth daily.   atorvastatin (LIPITOR) 80 MG tablet TAKE 1 TABLET BY MOUTH  DAILY   CALCIUM-MAGNESIUM-ZINC PO Take 1 capsule by mouth daily.   cyclobenzaprine (FLEXERIL) 5 MG tablet Take 1 tablet (5 mg total) by mouth 2 (two) times daily as needed for up to 20 doses for muscle spasms.   diltiazem (CARDIZEM SR) 120 MG 12 hr capsule Take 1 capsule by mouth once daily   docusate sodium (COLACE) 100 MG capsule Take 100 mg by mouth 2 (two) times daily.   ezetimibe (ZETIA) 10 MG tablet Take 1 tablet (10 mg total) by mouth daily.   famotidine (PEPCID) 20 MG tablet TAKE ONE TABLET BY MOUTH ONCE DAILY AT BEDTIME   furosemide (LASIX) 20 MG tablet Take 1 tablet by mouth  once daily   gabapentin (NEURONTIN) 300 MG capsule Take 300 mg by mouth 2 (two) times daily.   glimepiride (AMARYL) 1 MG tablet Take 1 tablet by mouth daily.   guaiFENesin (MUCINEX) 600 MG 12 hr tablet Take 1 tablet (600 mg total) by mouth 2 (two) times daily.   ipratropium-albuterol (DUONEB) 0.5-2.5 (3) MG/3ML SOLN Inhale 3 mLs into the lungs every 6 (six) hours as needed (wheezing or shortness of breath).   isosorbide mononitrate (IMDUR) 30 MG 24 hr tablet Take 1 tablet by mouth once daily   levocetirizine (XYZAL) 5 MG tablet Take 5 mg at bedtime by mouth.    montelukast (SINGULAIR) 10 MG tablet Take 10 mg at bedtime by mouth.    Multiple Vitamin (MULTIVITAMIN WITH MINERALS) TABS tablet Take 1 tablet by mouth daily.   olmesartan (BENICAR) 20 MG tablet Take 20 mg daily by mouth.   pantoprazole (PROTONIX) 40 MG tablet TAKE ONE TABLET BY MOUTH ONCE DAILY TAKE  30-60  MINUTES  BEFORE  FIRST  MEAL  OF  THE  DAY   predniSONE (DELTASONE) 10 MG tablet TAKE 1 TABLET BY MOUTH EVERY OTHER DAY   PROAIR HFA 108 (90 BASE) MCG/ACT inhaler Inhale 2 puffs into the lungs 4 (four) times daily as needed for wheezing or shortness of breath.   Respiratory Therapy Supplies (FLUTTER) DEVI Blow through 4 times per set, 3 sets per day when needed to clear lungs   TRELEGY ELLIPTA 100-62.5-25 MCG/ACT AEPB USE 1 INHALATION BY MOUTH  DAILY     Allergies:   Penicillins, Lopressor [metoprolol tartrate], and Codeine   Social History   Socioeconomic History   Marital status: Legally Separated    Spouse name: Not on file   Number of children: 2   Years of education: `   Highest education level: Not on file  Occupational History   Occupation: Retired  Tobacco Use   Smoking status: Former    Packs/day: 2.00    Years: 15.00    Total pack years: 30.00    Types: Cigarettes    Start date: 05/05/1958    Quit date: 05/05/1973    Years since quitting: 48.7   Smokeless tobacco: Never  Vaping Use   Vaping Use: Never used   Substance and Sexual Activity   Alcohol use: No    Alcohol/week: 0.0 standard drinks of alcohol   Drug use: No   Sexual activity: Not on file  Other Topics Concern   Not on file  Social History Narrative   Not on file   Social Determinants of Health   Financial Resource Strain: Not on file  Food Insecurity: Not  on file  Transportation Needs: Not on file  Physical Activity: Not on file  Stress: Not on file  Social Connections: Not on file     Family History: The patient's family history includes Allergies in an other family member; Asthma in his maternal aunt; Heart disease in his mother.  ROS:   Please see the history of present illness.    All other systems reviewed and are negative.  EKGs/Labs/Other Studies Reviewed:    The following studies were reviewed today:  EKG:  01/08/22: SR with 1st HB and AP with rare PVC; Apacing and VS 05/08/20: Possible sinus rhythm (low amplitude P wave) 1st HB, iRBBB 04/24/20: SR with 1st HB rate 77 with PACs and PVCs  Transthoracic Echocardiogram: Date:02/01/21 Results:  Study Conclusions   1. Left ventricular ejection fraction, by estimation, is 50 to 55%. The  left ventricle has low normal function. The left ventricle has no regional  wall motion abnormalities. There is moderate asymmetric left ventricular  hypertrophy of the basal-septal  segment. Left ventricular diastolic parameters were normal.   2. Right ventricular systolic function is normal. The right ventricular  size is normal. Tricuspid regurgitation signal is inadequate for assessing  PA pressure.   3. The mitral valve is normal in structure. Trivial mitral valve  regurgitation. No evidence of mitral stenosis.   4. The aortic valve is tricuspid. Aortic valve regurgitation is not  visualized. Mild aortic valve sclerosis is present, with no evidence of  aortic valve stenosis.   5. Aortic dilatation noted. There is mild dilatation of the ascending  aorta, measuring 38 mm.    Cardiac CT: Date: 03/01/2019 Results: Aortic Atherosclerosis, Left Dominant System, LM CAC Left main is a large artery that gives rise to LAD and LCX arteries. Minimal (<25%) calcified plaque.   LAD is a large vessel. There is minimal (<25%) mixed plaque in the ostium and mild (25-49%) mixed plaque proximally. There is a short segment of myocardial bridging distally. There is a large, branching D1 with minimal calcified plaque proximally and mild calcified plaque at the bifurcation. D2 and D3 are small and without significant stenosis.   LCX is a large, dominant artery that gives rise to one large OM1 branch. There is scattered minimal calcified plaque. There is a small OM1, medium OM2, large OM3 and small OM4 without evidence of significant disease.  NM Stress Testing: Date: 12/14/20 Results: The left ventricular ejection fraction is mildly decreased (45-54%). Nuclear stress EF: 40%. There was no ST segment deviation noted during stress. Defect 1: There is a medium defect of moderate severity present in the apex location. This is an intermediate risk study.   No evidence of ischemia. There is a fixed apical defect, with normal wall motion in this area, suggestive of artifact. Overall low cardiac counts with increased extracardiac activity. Mildly reduced EF without focal wall motion abnormalities   Recent Labs: 12/16/2021: ALT 20; B Natriuretic Peptide 65.0; BUN 15; Creatinine, Ser 1.37; Hemoglobin 14.7; Platelets 168; Potassium 3.4; Sodium 137  Recent Lipid Panel    Component Value Date/Time   CHOL 192 02/22/2021 0826   TRIG 146 02/22/2021 0826   HDL 50 02/22/2021 0826   CHOLHDL 3.8 02/22/2021 0826   LDLCALC 116 (H) 02/22/2021 0826   Physical Exam:    VS:  BP 122/70   Pulse 89   Ht 6' (1.829 m)   Wt 217 lb (98.4 kg)   SpO2 96%   BMI 29.43 kg/m     Wt  Readings from Last 3 Encounters:  01/08/22 217 lb (98.4 kg)  01/03/22 215 lb (97.5 kg)  12/16/21 211 lb 10.3  oz (96 kg)    Gen: No distress Neck: No JVD Cardiac: No Rubs or Gallops, No Murmur, IRIR and +2 radial pulses Respiratory: Clear to auscultation bilaterally, normal effort, normal  respiratory rate GI: Soft, nontender, non-distended  MS: No  moves all extremities Integument: Skin feels warm Neuro:  At time of evaluation, alert and oriented to person/place/time/situation  Psych: Normal affect, patient feels well  ASSESSMENT:    1. Chronic heart failure with preserved ejection fraction (HCC)   2. Coronary artery disease of native artery of native heart with stable angina pectoris (HCC)   3. Diabetes mellitus with coincident hypertension (HCC)   4. Hyperlipidemia associated with type 2 diabetes mellitus (HCC)   5. Aortic atherosclerosis (HCC)   6. Atrial tachycardia (HCC)   7. PVC's (premature ventricular contractions)   8. Chronotropic incompetence with sinus node dysfunction (HCC)   9. OSA (obstructive sleep apnea)     PLAN:    Heart Failure Preserved Ejection Fraction  COPD; OSA on BIPAP- seeing PCCM Frequent PVCs- seen by Dr. Graciela Husbands Hx of atrial tachycardia MDT PPM per Dr. Graciela Husbands HTN with DM Resolved; recent diarrhea - NYHA class II, Stage B, euvolemic  - Diuretic regimen: lasix 20 mg PO Daily  - given his euvolemic status  - Patient would not like to start additional medications if at all possible - we will having him increase his activity and if more SOB will offer SGLT2i - BMP today; if K is still low on lasix we will offer low dose aldactone - K goal of 4 - continue diltiazem 120 (BB held in the setting of COPD)  Coronary Artery Disease; Nonobstructive HLD with DM Aortic Atherosclerosis - asymptomatic  - anatomy: minimal non obstructive disease in 01/2019 - continue ASA 81 mg; - goal LDL < 70 ; on Zetia 10 and  atorvastatin 80 mg LDL improved but above goal; patient not presently amenable to new medications for heart attack prevention; will recheck labs today and  may offer lipid clinic referral - Imdur 30 mg Po Daily and PRN nitro; if worsening CP will trial higher Imdur dose   Six months me or APP    Medication Adjustments/Labs and Tests Ordered: Current medicines are reviewed at length with the patient today.  Concerns regarding medicines are outlined above.  Orders Placed This Encounter  Procedures   Lipid panel   ALT   Pro b natriuretic peptide (BNP)   EKG 12-Lead    No orders of the defined types were placed in this encounter.    Patient Instructions  Medication Instructions:  Your physician recommends that you continue on your current medications as directed. Please refer to the Current Medication list given to you today.  *If you need a refill on your cardiac medications before your next appointment, please call your pharmacy*   Lab Work: TODAY: FLP, ALT, BNP If you have labs (blood work) drawn today and your tests are completely normal, you will receive your results only by: MyChart Message (if you have MyChart) OR A paper copy in the mail If you have any lab test that is abnormal or we need to change your treatment, we will call you to review the results.   Testing/Procedures: NONE   Follow-Up: At [facility], you and your health needs are our priority.  As part of our continuing mission to  provide you with exceptional heart care, we have created designated Provider Care Teams.  These Care Teams include your primary Cardiologist (physician) and Advanced Practice Providers (APPs -  Physician Assistants and Nurse Practitioners) who all work together to provide you with the care you need, when you need it.    Your next appointment:   6 month(s)  The format for your next appointment:   In Person  Provider:   Christell Constant, MD      Important Information About Sugar         Signed, Christell Constant, MD  01/08/2022 8:38 AM    [facility]

## 2022-01-08 ENCOUNTER — Ambulatory Visit: Payer: Medicare Other | Attending: Internal Medicine | Admitting: Internal Medicine

## 2022-01-08 ENCOUNTER — Encounter: Payer: Self-pay | Admitting: Internal Medicine

## 2022-01-08 VITALS — BP 122/70 | HR 89 | Ht 72.0 in | Wt 217.0 lb

## 2022-01-08 DIAGNOSIS — I495 Sick sinus syndrome: Secondary | ICD-10-CM | POA: Diagnosis not present

## 2022-01-08 DIAGNOSIS — E119 Type 2 diabetes mellitus without complications: Secondary | ICD-10-CM

## 2022-01-08 DIAGNOSIS — I5032 Chronic diastolic (congestive) heart failure: Secondary | ICD-10-CM | POA: Diagnosis not present

## 2022-01-08 DIAGNOSIS — G4733 Obstructive sleep apnea (adult) (pediatric): Secondary | ICD-10-CM | POA: Diagnosis not present

## 2022-01-08 DIAGNOSIS — I700 Atherosclerosis of aorta: Secondary | ICD-10-CM | POA: Diagnosis not present

## 2022-01-08 DIAGNOSIS — E785 Hyperlipidemia, unspecified: Secondary | ICD-10-CM

## 2022-01-08 DIAGNOSIS — E1169 Type 2 diabetes mellitus with other specified complication: Secondary | ICD-10-CM | POA: Diagnosis not present

## 2022-01-08 DIAGNOSIS — I25118 Atherosclerotic heart disease of native coronary artery with other forms of angina pectoris: Secondary | ICD-10-CM | POA: Diagnosis not present

## 2022-01-08 DIAGNOSIS — I10 Essential (primary) hypertension: Secondary | ICD-10-CM

## 2022-01-08 DIAGNOSIS — I493 Ventricular premature depolarization: Secondary | ICD-10-CM | POA: Diagnosis not present

## 2022-01-08 DIAGNOSIS — I471 Supraventricular tachycardia: Secondary | ICD-10-CM | POA: Diagnosis not present

## 2022-01-08 NOTE — Patient Instructions (Signed)
Medication Instructions:  Your physician recommends that you continue on your current medications as directed. Please refer to the Current Medication list given to you today.  *If you need a refill on your cardiac medications before your next appointment, please call your pharmacy*   Lab Work: TODAY: FLP, ALT, BNP If you have labs (blood work) drawn today and your tests are completely normal, you will receive your results only by: MyChart Message (if you have MyChart) OR A paper copy in the mail If you have any lab test that is abnormal or we need to change your treatment, we will call you to review the results.   Testing/Procedures: NONE   Follow-Up: At [facility], you and your health needs are our priority.  As part of our continuing mission to provide you with exceptional heart care, we have created designated Provider Care Teams.  These Care Teams include your primary Cardiologist (physician) and Advanced Practice Providers (APPs -  Physician Assistants and Nurse Practitioners) who all work together to provide you with the care you need, when you need it.    Your next appointment:   6 month(s)  The format for your next appointment:   In Person  Provider:   Christell Constant, MD      Important Information About Sugar

## 2022-01-09 ENCOUNTER — Encounter: Payer: Self-pay | Admitting: Internal Medicine

## 2022-01-09 LAB — LIPID PANEL
Chol/HDL Ratio: 3.1 ratio (ref 0.0–5.0)
Cholesterol, Total: 153 mg/dL (ref 100–199)
HDL: 50 mg/dL (ref >39)
LDL Chol Calc (NIH): 84 mg/dL (ref 0–99)
Triglycerides: 103 mg/dL (ref 0–149)
VLDL Cholesterol Cal: 19 mg/dL (ref 5–40)

## 2022-01-09 LAB — PRO B NATRIURETIC PEPTIDE: NT-Pro BNP: 200 pg/mL (ref 0–486)

## 2022-01-09 LAB — ALT: ALT: 26 IU/L (ref 0–44)

## 2022-01-13 ENCOUNTER — Telehealth: Payer: Self-pay | Admitting: Internal Medicine

## 2022-01-13 NOTE — Telephone Encounter (Signed)
The patient has been notified of the result and verbalized understanding.  All questions (if any) were answered. Arvid Right Darnell Stimson, RN 01/13/2022 4:30 PM   Pt already taking zetia 10 mg PO QD. Will send to MD to further advise.

## 2022-01-13 NOTE — Telephone Encounter (Signed)
-----   Message from Christell Constant, MD sent at 01/12/2022 11:20 AM EDT ----- Results: Kidney function is ok LDL has improved, but is slightly above goal BNP within normal limits Plan: Will offer earlier follow up with daughter; will offer zetia 10 mg  Christell Constant, MD

## 2022-01-13 NOTE — Telephone Encounter (Signed)
Pt called about previous mychart message sent by Daughter, Anselm Lis, regarding a medication him and Dr. Izora Ribas discussed

## 2022-01-27 NOTE — Telephone Encounter (Signed)
Sent my chart message asking if pt would like to be seen in Pharmacy clinic to discuss SGLT medications.

## 2022-01-29 ENCOUNTER — Telehealth: Payer: Self-pay

## 2022-01-29 NOTE — Patient Outreach (Signed)
  Care Coordination   01/29/2022 Name: SAYRE WITHERINGTON MRN: [id number] DOB: [birth year]   Care Coordination Outreach Attempts:  An unsuccessful telephone outreach was attempted today to offer the patient information about available care coordination services as a benefit of their health plan.   Follow Up Plan:  Additional outreach attempts will be made to offer the patient care coordination information and services.   Encounter Outcome:  No Answer  Care Coordination Interventions Activated:  No   Care Coordination Interventions:  No, not indicated    Jone Baseman, RN, MSN [facility] Care Management Care Management Coordinator Direct Line [phone]

## 2022-01-29 NOTE — Telephone Encounter (Signed)
Please see my chart message for conclusion to this encounter.

## 2022-02-04 ENCOUNTER — Other Ambulatory Visit: Payer: Self-pay | Admitting: Internal Medicine

## 2022-02-11 ENCOUNTER — Telehealth: Payer: Self-pay

## 2022-02-11 DIAGNOSIS — M5416 Radiculopathy, lumbar region: Secondary | ICD-10-CM | POA: Diagnosis not present

## 2022-02-11 DIAGNOSIS — Z23 Encounter for immunization: Secondary | ICD-10-CM | POA: Diagnosis not present

## 2022-02-11 NOTE — Patient Instructions (Signed)
Visit Information  Thank you for taking time to visit with me today. Please don't hesitate to contact me if I can be of assistance to you.   Following are the goals we discussed today:   Goals Addressed             This Visit's Progress    COMPLETED: Care Coordination Activities-No follow up required       Care Coordination Interventions: Advised patient to schedule annual wellness visit. Patient has appointment 02/11/22           If you are experiencing a Mental Health or [place] or need someone to talk to, please    Patient verbalizes understanding of instructions and care plan provided today and agrees to view in [place]. Active MyChart status and patient understanding of how to access instructions and care plan via MyChart confirmed with patient.     No further follow up required: Patient decline  Jone Baseman, RN, MSN [place] [facility] Care Management Coordinator Direct Line [phone]

## 2022-02-11 NOTE — Patient Outreach (Signed)
  Care Coordination   Initial Visit Note   02/11/2022 Name: [name] [name] MRN: [id number] DOB: 1945/11/17  [name] [name] is a 76 y.o. year old male who sees [name] Frees, MD for primary care. I spoke with  [name] [name] by phone today.  What matters to the patients health and wellness today?  none    Goals Addressed             This Visit's Progress    COMPLETED: Care Coordination Activities-No follow up required       Care Coordination Interventions: Advised patient to schedule annual wellness visit. Patient has appointment 02/11/22          SDOH assessments and interventions completed:  Yes     Care Coordination Interventions Activated:  Yes  Care Coordination Interventions:  Yes, provided   Follow up plan: No further intervention required.   Encounter Outcome:  Pt. Visit Completed   [name] Baseman, RN, MSN [place] [facility] Care Management Coordinator Direct Line [phone]

## 2022-02-12 ENCOUNTER — Other Ambulatory Visit: Payer: Self-pay | Admitting: Internal Medicine

## 2022-02-13 ENCOUNTER — Ambulatory Visit: Payer: Medicare Other | Attending: Internal Medicine | Admitting: Internal Medicine

## 2022-02-13 VITALS — BP 132/84 | HR 73 | Ht 72.0 in | Wt 221.8 lb

## 2022-02-13 DIAGNOSIS — I493 Ventricular premature depolarization: Secondary | ICD-10-CM

## 2022-02-13 DIAGNOSIS — Z950 Presence of cardiac pacemaker: Secondary | ICD-10-CM

## 2022-02-13 DIAGNOSIS — I4719 Other supraventricular tachycardia: Secondary | ICD-10-CM | POA: Diagnosis not present

## 2022-02-13 DIAGNOSIS — I495 Sick sinus syndrome: Secondary | ICD-10-CM

## 2022-02-13 MED ORDER — FUROSEMIDE 40 MG PO TABS: 40.0000 mg | ORAL_TABLET | Freq: Every day | ORAL | 3 refills | Status: DC

## 2022-02-13 MED ORDER — METOPROLOL TARTRATE 50 MG PO TABS
50.0000 mg | ORAL_TABLET | Freq: Two times a day (BID) | ORAL | 3 refills | Status: DC
Start: 2022-02-13 — End: 2023-01-26

## 2022-02-13 NOTE — Patient Instructions (Addendum)
Medication Instructions:  Your physician has recommended you make the following change in your medication:   ** Begin Metoprolol Tartrate 50mg  - 1 tablet by mouth twice daily.  ** Stop Imdur - Isosorbide  ** Decrease your Benicar to 10mg  - 1/2 tablet by mouth daily  ** Furosemide 4-mg - 1 tablet by mouth daily  *If you need a refill on your cardiac medications before your next appointment, please call your pharmacy*   Lab Work: None ordered.  If you have labs (blood work) drawn today and your tests are completely normal, you will receive your results only by: [place] (if you have MyChart) OR A paper copy in the mail If you have any lab test that is abnormal or we need to change your treatment, we will call you to review the results.   Testing/Procedures: None ordered.    Follow-Up: At [facility], you and your health needs are our priority.  As part of our continuing mission to provide you with exceptional heart care, we have created designated Provider Care Teams.  These Care Teams include your primary Cardiologist (physician) and Advanced Practice Providers (APPs -  Physician Assistants and Nurse Practitioners) who all work together to provide you with the care you need, when you need it.  We recommend signing up for the patient portal called "MyChart".  Sign up information is provided on this After Visit Summary.  MyChart is used to connect with patients for Virtual Visits (Telemedicine).  Patients are able to view lab/test results, encounter notes, upcoming appointments, etc.  Non-urgent messages can be sent to your provider as well.   To learn more about what you can do with MyChart, go to [email or web address].    Your next appointment:   1 month with Dr Olin Pia PA-C  Important Information About Sugar

## 2022-02-13 NOTE — Progress Notes (Signed)
Patient Care Team: Shirline Frees, MD as PCP - General (Family Medicine) Deboraha Sprang, MD as PCP - Electrophysiology (Cardiology) Werner Lean, MD as PCP - Cardiology (Cardiology)   HPI:  [name] [name] is a 76 y.o. male Seen in followup for pacemaker implanted for chronotropic incompetence and sinus node dysfunction 2007  by Dr. Ardeen Fillers.  He underwent gen replacement 2/17     .The patient denies chest pain, nocturnal dyspnea, orthopnea .  There have been no palpitations or syncope.  Complains of presyncope unassociated with palpitations.  (In this regard, he is unaware of asynchronous ventricular pacing).  Also some dyspnea on exertion and some peripheral edema.  Not orthostatically lightheaded.  Diet is fluid deplete (chewing gum) and salt deplete.[name]   DATE TEST EF   9/14 MYOVIEW    % No ischemia  4/16 Echo  55-60 %   10/21 CTA  CaScore 117 No obstruc disease  1/22 Echo  60-65% Ao Asc 43 mm        . Date Cr K Hgb  8/23 1.37 3.4 14.7            Past Medical History:  Diagnosis Date   Arthritis    Bradycardia    pacemaker - Medtronic Adapta #ADDRO1 - December 22, 2005   Carpal tunnel syndrome on both sides    Carpal tunnel syndrome, bilateral    Chronic back pain    Chronic kidney disease    COPD (chronic obstructive pulmonary disease) ([place])    Depression    Diabetes mellitus    type II controled with diet   GERD (gastroesophageal reflux disease)    Headache(784.0)    Hyperlipidemia    Hypertension    Hypertensive retinopathy    Insomnia    Neuromuscular disorder ([place])    peripheral neuropathy BLE   Pneumonia    Presence of permanent cardiac pacemaker    Wears glasses     Past Surgical History:  Procedure Laterality Date   BACK SURGERY     CARPAL TUNNEL RELEASE Right 03/24/2017   Procedure: Right CARPAL TUNNEL RELEASE;  Surgeon: Erline Levine, MD;  Location: [facility];  Service: Neurosurgery;  Laterality: Right;  Right CARPAL TUNNEL RELEASE    CATARACT EXTRACTION     EP IMPLANTABLE DEVICE N/A 06/27/2015   Procedure:  PPM Generator Changeout;  Surgeon: Deboraha Sprang, MD;  Location: [facility];  Service: Cardiovascular;  Laterality: N/A;   EYE SURGERY     HEMORRHOID SURGERY     INSERT / REPLACE / REMOVE PACEMAKER     MULTIPLE TOOTH EXTRACTIONS     PACEMAKER INSERTION  ?2007   TOTAL HIP ARTHROPLASTY Right 01/25/2013   Procedure: TOTAL HIP ARTHROPLASTY ANTERIOR APPROACH;  Surgeon: Hessie Dibble, MD;  Location: [facility];  Service: Orthopedics;  Laterality: Right;   ULNAR NERVE TRANSPOSITION Right 03/24/2017   Procedure: Right Ulnar nerve release;  Surgeon: Erline Levine, MD;  Location: [facility];  Service: Neurosurgery;  Laterality: Right;  Right Ulnar nerve release    Current Outpatient Medications  Medication Sig Dispense Refill   acetaminophen (TYLENOL) 650 MG CR tablet Take 1,300 mg daily as needed by mouth for pain.      aspirin 81 MG tablet Take 81 mg by mouth daily.     atorvastatin (LIPITOR) 80 MG tablet TAKE 1 TABLET BY MOUTH  DAILY 100 tablet 2   CALCIUM-MAGNESIUM-ZINC PO Take 1 capsule by mouth daily.  diltiazem (CARDIZEM SR) 120 MG 12 hr capsule Take 1 capsule by mouth once daily 90 capsule 1   docusate sodium (COLACE) 100 MG capsule Take 100 mg by mouth 2 (two) times daily.     ezetimibe (ZETIA) 10 MG tablet Take 1 tablet (10 mg total) by mouth daily. 90 tablet 3   famotidine (PEPCID) 20 MG tablet TAKE ONE TABLET BY MOUTH ONCE DAILY AT BEDTIME 30 tablet 0   furosemide (LASIX) 20 MG tablet Take 1 tablet by mouth once daily 90 tablet 1   gabapentin (NEURONTIN) 300 MG capsule Take 300 mg by mouth 2 (two) times daily.     glimepiride (AMARYL) 1 MG tablet Take 1 tablet by mouth daily.     guaiFENesin (MUCINEX) 600 MG 12 hr tablet Take 1 tablet (600 mg total) by mouth 2 (two) times daily. 30 tablet 3   ipratropium-albuterol (DUONEB) 0.5-2.5 (3) MG/3ML SOLN Inhale 3 mLs into the lungs every 6 (six) hours as needed  (wheezing or shortness of breath). 360 mL 12   isosorbide mononitrate (IMDUR) 30 MG 24 hr tablet Take 1 tablet by mouth once daily 90 tablet 1   levocetirizine (XYZAL) 5 MG tablet Take 5 mg at bedtime by mouth.      montelukast (SINGULAIR) 10 MG tablet Take 10 mg at bedtime by mouth.      Multiple Vitamin (MULTIVITAMIN WITH MINERALS) TABS tablet Take 1 tablet by mouth daily.     nitroGLYCERIN (NITROSTAT) 0.4 MG SL tablet DISSOLVE ONE TABLET UNDER THE TONGUE EVERY 5 MINUTES AS NEEDED FOR CHEST PAIN.  DO NOT EXCEED A TOTAL OF 3 DOSES IN 15 MINUTES 25 tablet 5   olmesartan (BENICAR) 20 MG tablet Take 20 mg daily by mouth.     pantoprazole (PROTONIX) 40 MG tablet TAKE ONE TABLET BY MOUTH ONCE DAILY TAKE  30-60  MINUTES  BEFORE  FIRST  MEAL  OF  THE  DAY 30 tablet 0   predniSONE (DELTASONE) 10 MG tablet TAKE 1 TABLET BY MOUTH EVERY OTHER DAY 60 tablet 3   PROAIR HFA 108 (90 BASE) MCG/ACT inhaler Inhale 2 puffs into the lungs 4 (four) times daily as needed for wheezing or shortness of breath.     Respiratory Therapy Supplies (FLUTTER) DEVI Blow through 4 times per set, 3 sets per day when needed to clear lungs 1 each 0   TRELEGY ELLIPTA 100-62.5-25 MCG/ACT AEPB USE 1 INHALATION BY MOUTH DAILY 180 each 3   cyclobenzaprine (FLEXERIL) 5 MG tablet Take 1 tablet (5 mg total) by mouth 2 (two) times daily as needed for up to 20 doses for muscle spasms. (Patient not taking: Reported on 02/13/2022) 20 tablet 0   No current facility-administered medications for this visit.    Allergies  Allergen Reactions   Penicillins Anaphylaxis    Has patient had a PCN reaction causing immediate rash, facial/tongue/throat swelling, SOB or lightheadedness with hypotension: Yes Has patient had a PCN reaction causing severe rash involving mucus membranes or skin necrosis: No Has patient had a PCN reaction that required hospitalization: Yes Has patient had a PCN reaction occurring within the last 10 years: No If all of the  above answers are "NO", then may proceed with Cephalosporin use.    Lopressor [Metoprolol Tartrate]     " It drops my heart rate."   Codeine Rash    Review of Systems negative except from HPI and PMH  Physical Exam BP 132/84   Pulse 73   Ht 6' (  1.829 m)   Wt 221 lb 12.8 oz (100.6 kg)   BMI 30.08 kg/m  Well developed and nourished in no acute distress HENT normal Neck supple with JVP- 8-10  Clear Device pocket well healed; without hematoma or erythema.  There is no tethering  Regular rate and rhythm, no murmurs or gallops Abd-soft with active BS No Clubbing cyanosis 1+ edema Skin-warm and dry A & Oriented  Grossly normal sensory and motor function  ECG apace 73 24/01/   Assessment and  Plan  Sinus node dysfunction   Pacemaker Medtronic   PVCs-frequent  VT sustained   Hypertension   Atrial tachycardia  Dyspnea   COPD  Presyncope   Episodes of presyncope.  Has been identified as having prolonged nonsustained and  Sustained ventricular tachycardia.  In the context of a normal LV function and nonobstructive coronary disease, we will start with beta-blocker metoprolol 50 twice daily.  To allow for blood pressure, we will discontinue his isosorbide in the absence of coronary disease and decrease his Benicar from 20--10.  He is also volume overloaded.  His JVP as well as edema, we will increase his furosemide from 20--40.  We will need to check a metabolic profile and magnesium in about 10 days time  There is a discussion as to the introducing SGLT2.  I would like to hold off on that as we try to get this ventricular arrhythmia under control.  We will have him come back in a month.  His PVC count about 12%, he has had frequent episodes of nonsustained and sustained VT (by time).  If in the next month that has not changed considerably, would introduce flecainide 100 twice daily.  Blood pressures are well controlled.  Continue medications as outlined above

## 2022-02-14 LAB — CUP PACEART INCLINIC DEVICE CHECK
Battery Impedance: 526 Ohm
Battery Remaining Longevity: 82 mo
Battery Voltage: 2.77 V
Brady Statistic AP VP Percent: 4 %
Brady Statistic AP VS Percent: 88 %
Brady Statistic AS VP Percent: 0 %
Brady Statistic AS VS Percent: 7 %
Date Time Interrogation Session: 20231012143100
Implantable Lead Implant Date: 20070820
Implantable Lead Implant Date: 20070820
Implantable Lead Location: 753859
Implantable Lead Location: 753860
Implantable Lead Model: 4092
Implantable Lead Model: 5076
Implantable Pulse Generator Implant Date: 20170222
Lead Channel Impedance Value: 467 Ohm
Lead Channel Impedance Value: 568 Ohm
Lead Channel Pacing Threshold Amplitude: 1 V
Lead Channel Pacing Threshold Amplitude: 1 V
Lead Channel Pacing Threshold Amplitude: 1 V
Lead Channel Pacing Threshold Amplitude: 1.125 V
Lead Channel Pacing Threshold Pulse Width: 0.4 ms
Lead Channel Pacing Threshold Pulse Width: 0.4 ms
Lead Channel Pacing Threshold Pulse Width: 0.4 ms
Lead Channel Pacing Threshold Pulse Width: 0.4 ms
Lead Channel Sensing Intrinsic Amplitude: 2.8 mV
Lead Channel Sensing Intrinsic Amplitude: 4 mV
Lead Channel Setting Pacing Amplitude: 2.25 V
Lead Channel Setting Pacing Amplitude: 2.5 V
Lead Channel Setting Pacing Pulse Width: 0.4 ms
Lead Channel Setting Sensing Sensitivity: 1 mV

## 2022-03-07 ENCOUNTER — Encounter: Payer: Self-pay | Admitting: Physician Assistant

## 2022-03-07 ENCOUNTER — Ambulatory Visit: Payer: Medicare Other | Attending: Physician Assistant | Admitting: Physician Assistant

## 2022-03-07 VITALS — BP 132/80 | HR 78 | Ht 72.0 in | Wt 219.8 lb

## 2022-03-07 DIAGNOSIS — I472 Ventricular tachycardia, unspecified: Secondary | ICD-10-CM | POA: Diagnosis not present

## 2022-03-07 DIAGNOSIS — R079 Chest pain, unspecified: Secondary | ICD-10-CM

## 2022-03-07 DIAGNOSIS — Z0000 Encounter for general adult medical examination without abnormal findings: Secondary | ICD-10-CM | POA: Diagnosis not present

## 2022-03-07 DIAGNOSIS — Z950 Presence of cardiac pacemaker: Secondary | ICD-10-CM | POA: Diagnosis not present

## 2022-03-07 DIAGNOSIS — I10 Essential (primary) hypertension: Secondary | ICD-10-CM

## 2022-03-07 DIAGNOSIS — M25552 Pain in left hip: Secondary | ICD-10-CM | POA: Diagnosis not present

## 2022-03-07 DIAGNOSIS — E1142 Type 2 diabetes mellitus with diabetic polyneuropathy: Secondary | ICD-10-CM | POA: Diagnosis not present

## 2022-03-07 DIAGNOSIS — N183 Chronic kidney disease, stage 3 unspecified: Secondary | ICD-10-CM | POA: Diagnosis not present

## 2022-03-07 DIAGNOSIS — Z79899 Other long term (current) drug therapy: Secondary | ICD-10-CM

## 2022-03-07 DIAGNOSIS — K219 Gastro-esophageal reflux disease without esophagitis: Secondary | ICD-10-CM | POA: Diagnosis not present

## 2022-03-07 DIAGNOSIS — J449 Chronic obstructive pulmonary disease, unspecified: Secondary | ICD-10-CM | POA: Diagnosis not present

## 2022-03-07 DIAGNOSIS — I5032 Chronic diastolic (congestive) heart failure: Secondary | ICD-10-CM | POA: Diagnosis not present

## 2022-03-07 DIAGNOSIS — E7800 Pure hypercholesterolemia, unspecified: Secondary | ICD-10-CM | POA: Diagnosis not present

## 2022-03-07 LAB — BASIC METABOLIC PANEL
BUN/Creatinine Ratio: 12 (ref 10–24)
BUN: 15 mg/dL (ref 8–27)
CO2: 27 mmol/L (ref 20–29)
Calcium: 9.5 mg/dL (ref 8.6–10.2)
Chloride: 100 mmol/L (ref 96–106)
Creatinine, Ser: 1.29 mg/dL — ABNORMAL HIGH (ref 0.76–1.27)
Glucose: 165 mg/dL — ABNORMAL HIGH (ref 70–99)
Potassium: 4.3 mmol/L (ref 3.5–5.2)
Sodium: 140 mmol/L (ref 134–144)
eGFR: 57 mL/min/{1.73_m2} — ABNORMAL LOW (ref >59)

## 2022-03-07 LAB — CUP PACEART INCLINIC DEVICE CHECK
Battery Impedance: 576 Ohm
Battery Remaining Longevity: 79 mo
Battery Voltage: 2.77 V
Brady Statistic AP VP Percent: 2 %
Brady Statistic AP VS Percent: 97 %
Brady Statistic AS VP Percent: 0 %
Brady Statistic AS VS Percent: 1 %
Date Time Interrogation Session: 20231103172716
Implantable Lead Connection Status: 753985
Implantable Lead Connection Status: 753985
Implantable Lead Implant Date: 20070820
Implantable Lead Implant Date: 20070820
Implantable Lead Location: 753859
Implantable Lead Location: 753860
Implantable Lead Model: 4092
Implantable Lead Model: 5076
Implantable Pulse Generator Implant Date: 20170222
Lead Channel Impedance Value: 481 Ohm
Lead Channel Impedance Value: 616 Ohm
Lead Channel Pacing Threshold Amplitude: 0.875 V
Lead Channel Pacing Threshold Amplitude: 1 V
Lead Channel Pacing Threshold Amplitude: 1 V
Lead Channel Pacing Threshold Amplitude: 1.125 V
Lead Channel Pacing Threshold Pulse Width: 0.4 ms
Lead Channel Pacing Threshold Pulse Width: 0.4 ms
Lead Channel Pacing Threshold Pulse Width: 0.4 ms
Lead Channel Pacing Threshold Pulse Width: 0.4 ms
Lead Channel Sensing Intrinsic Amplitude: 2.8 mV
Lead Channel Setting Pacing Amplitude: 2.25 V
Lead Channel Setting Pacing Amplitude: 2.5 V
Lead Channel Setting Pacing Pulse Width: 0.4 ms
Lead Channel Setting Sensing Sensitivity: 1 mV
Zone Setting Status: 755011
Zone Setting Status: 755011

## 2022-03-07 LAB — MAGNESIUM: Magnesium: 2.2 mg/dL (ref 1.6–2.3)

## 2022-03-07 MED ORDER — ISOSORBIDE MONONITRATE ER 30 MG PO TB24: 30.0000 mg | ORAL_TABLET | Freq: Every day | ORAL | 3 refills | Status: DC

## 2022-03-07 NOTE — Patient Instructions (Signed)
Medication Instructions:   START TAKING: IMDUR 30 MG ONCE A DAY   *If you need a refill on your cardiac medications before your next appointment, please call your pharmacy*   Lab Work: [facility] AND [place]    If you have labs (blood work) drawn today and your tests are completely normal, you will receive your results only by: [place] (if you have MyChart) OR A paper copy in the mail If you have any lab test that is abnormal or we need to change your treatment, we will call you to review the results.   Testing/Procedures: NONE ORDERED  TODAY    Follow-Up: At [facility], you and your health needs are our priority.  As part of our continuing mission to provide you with exceptional heart care, we have created designated Provider Care Teams.  These Care Teams include your primary Cardiologist (physician) and Advanced Practice Providers (APPs -  Physician Assistants and Nurse Practitioners) who all work together to provide you with the care you need, when you need it.  We recommend signing up for the patient portal called "MyChart".  Sign up information is provided on this After Visit Summary.  MyChart is used to connect with patients for Virtual Visits (Telemedicine).  Patients are able to view lab/test results, encounter notes, upcoming appointments, etc.  Non-urgent messages can be sent to your provider as well.   To learn more about what you can do with MyChart, go to [email or web address].    Your next appointment:   2-3 month(s)  The format for your next appointment:   In Person  Provider:   You may see  one of the following Advanced Practice Providers on your designated Care Team:    Tommye Standard, [place]  Legrand Como "Jonni Sanger" Chalmers Cater, [place]  Other Instructions   Important Information About Sugar

## 2022-03-07 NOTE — Progress Notes (Signed)
Cardiology Office Note Date:  03/07/2022  Patient ID:  Tyreik, Delahoussaye Feb 18, 1946, MRN [id number] PCP:  [name], MD  Electrophysiologist:  Dr. Graciela Husbands Cardiology: Dr. Izora Ribas Pulmonology: Dr. Maple Hudson   Chief Complaint:    1 mo  History of Present Illness: ALARIK RADU is a 76 y.o. male with history of O2 dep COPD (follows regularly with Dr. Maple Hudson), restrictive lung disease, HTN, DM, HLD, bradycardia w/PPM, ATach, PVCs, HFpEF  he saw Dr. Graciela Husbands Feb 2018, he was doing well, his note mention a low risk myoview done in 2014.  I saw him Feb 2019, he was being treated for recurrent bronchitis, outside of this was feeling well.  No CP, palpitations, no dizziness, near syncope or syncope.  He reported his PMD monitors and manages his cholesterol  He saw [name], NP, advised by his pulmonologist to see cardiology with c/o CP.  He was planned for coronary CT, prescribed s/l NTG. CT noted NOD.  I saw him Dec 2020 He is doing well.  He mentions the CP with very vigorous/peak "max" kind of activities, like chopping wood.  None since his last visit above.  NO dizzy spells, no near syncope or syncope.  He denise any palpitations, no rest SOB, no DOE with his ADLs or even moderately vigorous activities.   He mentions he has been transitioned from CPAP to BIPAP, and that Dr. Maple Hudson is weaning him from O2 now, even at night. Her reports his PMD does labs Q 6 months and manages his lipids. No changes were made.  He saw Dr. Graciela Husbands Feb 2022, discussed SOB, recent steroid taper via pulm for COPD exacerbation, struggles with CP, (noting no obstructive CAD by CT), mentioned waking with CP and poor taste in his mouth, on PPI and H2 blocker.  Advised taking protonix at HS. He was felt to be volume stable, though HCTZ changed to lasix. Noted PVC burden of 10% with preserved EF.  He last saw Dr. Izora Ribas 01/08/22.  Had 3 hospitalizations/ER visits this year  for sepsis and AKI, diarrhea, and  groin pain. Rare CP, no recent s/l NTG needs, doing OK Euvolemic, pt preferred to avoid more meds.discussed perhaps SGLT2i in the future, perhaps aldactone if hypokalemic. Continued on dilt (avoiding BB w/COPD)  He saw Dr. Graciela Husbands 02/13/22, c/o near syncope, no palpitations, not orthostatic, noted on device with having prolonged nonsustained and  Sustained ventricular tachycardia.  In the context of a normal LV function and nonobstructive coronary disease, we will start with beta-blocker metoprolol 50 twice daily.  To allow for blood pressure, we will discontinue his isosorbide in the absence of coronary disease and decrease his Benicar from 20--10. ---He is also volume overloaded.  His JVP as well as edema, we will increase his furosemide from 20--40. --There is a discussion as to the introducing SGLT2.  I would like to hold off on that as we try to get this ventricular arrhythmia under control.  We will have him come back in a month.  His PVC count about 12%, he has had frequent episodes of nonsustained and sustained VT (by time).  If in the next month that has not changed considerably, would introduce flecainide 100 twice daily.   TODAY He feels well. Stays moving/busy. He denies any notable or new symptoms with the medication changes made. Reports good urine OP. No particular new or different SOB He did have some CP though, had finished blowing his yard/leaves, was wrapping up the extension cord and  developed central chest pressure.  Took a s/l NTG and settled. He has had some sharp/fleeting CP intermittently some days. No dizzy spells, near syncope or syncope.   Device History: MDT dual chamber PPM implanted 2007 for SSS, chronotropic incompetence, gen change 2017  Past Medical History:  Diagnosis Date   Arthritis    Bradycardia    pacemaker - Medtronic Adapta #ADDRO1 - December 22, 2005   Carpal tunnel syndrome on both sides    Carpal tunnel syndrome, bilateral    Chronic back pain     Chronic kidney disease    COPD (chronic obstructive pulmonary disease) ([facility])    Depression    Diabetes mellitus    type II controled with diet   GERD (gastroesophageal reflux disease)    Headache(784.0)    Hyperlipidemia    Hypertension    Hypertensive retinopathy    Insomnia    Neuromuscular disorder ([facility])    peripheral neuropathy BLE   Pneumonia    Presence of permanent cardiac pacemaker    Wears glasses     Past Surgical History:  Procedure Laterality Date   BACK SURGERY     CARPAL TUNNEL RELEASE Right 03/24/2017   Procedure: Right CARPAL TUNNEL RELEASE;  Surgeon: Maeola Harman, MD;  Location: [facility];  Service: Neurosurgery;  Laterality: Right;  Right CARPAL TUNNEL RELEASE   CATARACT EXTRACTION     EP IMPLANTABLE DEVICE N/A 06/27/2015   Procedure:  PPM Generator Changeout;  Surgeon: Duke Salvia, MD;  Location: [facility] INVASIVE [facility];  Service: Cardiovascular;  Laterality: N/A;   EYE SURGERY     HEMORRHOID SURGERY     INSERT / REPLACE / REMOVE PACEMAKER     MULTIPLE TOOTH EXTRACTIONS     PACEMAKER INSERTION  ?2007   TOTAL HIP ARTHROPLASTY Right 01/25/2013   Procedure: TOTAL HIP ARTHROPLASTY ANTERIOR APPROACH;  Surgeon: Velna Ochs, MD;  Location: [facility];  Service: Orthopedics;  Laterality: Right;   ULNAR NERVE TRANSPOSITION Right 03/24/2017   Procedure: Right Ulnar nerve release;  Surgeon: Maeola Harman, MD;  Location: [facility] OR;  Service: Neurosurgery;  Laterality: Right;  Right Ulnar nerve release    Current Outpatient Medications  Medication Sig Dispense Refill   acetaminophen (TYLENOL) 650 MG CR tablet Take 1,300 mg daily as needed by mouth for pain.      aspirin 81 MG tablet Take 81 mg by mouth daily.     atorvastatin (LIPITOR) 80 MG tablet TAKE 1 TABLET BY MOUTH  DAILY 100 tablet 2   CALCIUM-MAGNESIUM-ZINC PO Take 1 capsule by mouth daily.     cyclobenzaprine (FLEXERIL) 5 MG tablet Take 1 tablet (5 mg total) by mouth 2 (two) times daily as needed for up to 20 doses  for muscle spasms. (Patient not taking: Reported on 02/13/2022) 20 tablet 0   diltiazem (CARDIZEM SR) 120 MG 12 hr capsule Take 1 capsule by mouth once daily 90 capsule 1   docusate sodium (COLACE) 100 MG capsule Take 100 mg by mouth 2 (two) times daily.     ezetimibe (ZETIA) 10 MG tablet Take 1 tablet (10 mg total) by mouth daily. 90 tablet 3   famotidine (PEPCID) 20 MG tablet TAKE ONE TABLET BY MOUTH ONCE DAILY AT BEDTIME 30 tablet 0   furosemide (LASIX) 40 MG tablet Take 1 tablet (40 mg total) by mouth daily. 90 tablet 3   gabapentin (NEURONTIN) 300 MG capsule Take 300 mg by mouth 2 (two) times daily.     glimepiride (AMARYL)  1 MG tablet Take 1 tablet by mouth daily.     guaiFENesin (MUCINEX) 600 MG 12 hr tablet Take 1 tablet (600 mg total) by mouth 2 (two) times daily. 30 tablet 3   ipratropium-albuterol (DUONEB) 0.5-2.5 (3) MG/3ML SOLN Inhale 3 mLs into the lungs every 6 (six) hours as needed (wheezing or shortness of breath). 360 mL 12   levocetirizine (XYZAL) 5 MG tablet Take 5 mg at bedtime by mouth.      metoprolol tartrate (LOPRESSOR) 50 MG tablet Take 1 tablet (50 mg total) by mouth 2 (two) times daily. 180 tablet 3   montelukast (SINGULAIR) 10 MG tablet Take 10 mg at bedtime by mouth.      Multiple Vitamin (MULTIVITAMIN WITH MINERALS) TABS tablet Take 1 tablet by mouth daily.     nitroGLYCERIN (NITROSTAT) 0.4 MG SL tablet DISSOLVE ONE TABLET UNDER THE TONGUE EVERY 5 MINUTES AS NEEDED FOR CHEST PAIN.  DO NOT EXCEED A TOTAL OF 3 DOSES IN 15 MINUTES 25 tablet 5   olmesartan (BENICAR) 20 MG tablet Take 10 mg by mouth daily.     pantoprazole (PROTONIX) 40 MG tablet TAKE ONE TABLET BY MOUTH ONCE DAILY TAKE  30-60  MINUTES  BEFORE  FIRST  MEAL  OF  THE  DAY 30 tablet 0   predniSONE (DELTASONE) 10 MG tablet TAKE 1 TABLET BY MOUTH EVERY OTHER DAY 60 tablet 3   PROAIR HFA 108 (90 BASE) MCG/ACT inhaler Inhale 2 puffs into the lungs 4 (four) times daily as needed for wheezing or shortness of  breath.     Respiratory Therapy Supplies (FLUTTER) DEVI Blow through 4 times per set, 3 sets per day when needed to clear lungs 1 each 0   TRELEGY ELLIPTA 100-62.5-25 MCG/ACT AEPB USE 1 INHALATION BY MOUTH DAILY 180 each 3   No current facility-administered medications for this visit.    Allergies:   Penicillins, Lopressor [metoprolol tartrate], and Codeine   Social History:  The patient  reports that he quit smoking about 48 years ago. His smoking use included cigarettes. He started smoking about 63 years ago. He has a 30.00 pack-year smoking history. He has never used smokeless tobacco. He reports that he does not drink alcohol and does not use drugs.   Family History:  The patient's family history includes Allergies in an other family member; Asthma in his maternal aunt; Heart disease in his mother.  ROS:  Please see the history of present illness.  All other systems are reviewed and otherwise negative.   PHYSICAL EXAM:  VS:  There were no vitals taken for this visit. BMI: There is no height or weight on file to calculate BMI. Well nourished, well developed, in no acute distress  HEENT: normocephalic, atraumatic  Neck: no JVD, carotid bruits or masses Cardiac:  RRR; no significant murmurs, no rubs, or gallops Lungs: CTA b/l, no wheezing, rhonchi or rales  Abd: soft, nontender, obese MS: no deformity, age appropriate atrophy Ext:   no edema  Skin: warm and dry, no rash Neuro:  No gross deficits appreciated Psych: euthymic mood, full affect   PPM site is stable, no tethering or discomfort   EKG:  Not done today  PPM interrogation done today and reviewed by myself;  Battery and lead measurements are good R waves small but not new AP 99% VP 1.9% PVCs 202,898 3 HVR episodes, 24 seconds, 13 seconds, 10 seconds (improved from minutes in duration)   02/01/2021: TTE  1. Left ventricular ejection  fraction, by estimation, is 50 to 55%. The  left ventricle has low normal function.  The left ventricle has no regional  wall motion abnormalities. There is moderate asymmetric left ventricular  hypertrophy of the basal-septal  segment. Left ventricular diastolic parameters were normal.   2. Right ventricular systolic function is normal. The right ventricular  size is normal. Tricuspid regurgitation signal is inadequate for assessing  PA pressure.   3. The mitral valve is normal in structure. Trivial mitral valve  regurgitation. No evidence of mitral stenosis.   4. The aortic valve is tricuspid. Aortic valve regurgitation is not  visualized. Mild aortic valve sclerosis is present, with no evidence of  aortic valve stenosis.   5. Aortic dilatation noted. There is mild dilatation of the ascending  aorta, measuring 38 mm.    03/01/2019: Coronary CTA IMPRESSION: 1. Coronary calcium score of 117. This was 36th percentile for age and sex matched control. 2. Normal coronary origin with left dominance. 3. No non-obstructive CAD.  08/29/14: TTE Study Conclusions - Left ventricle: The cavity size was normal. There was mild focal   basal hypertrophy of the septum. Systolic function was normal.   The estimated ejection fraction was in the range of 55% to 60%.   Wall motion was normal; there were no regional wall motion   abnormalities. Doppler parameters are consistent with abnormal   left ventricular relaxation (grade 1 diastolic dysfunction). - Left atrium: The atrium was mildly dilated. - Pulmonary arteries: Systolic pressure was mildly increased. PA   peak pressure: 34 mm Hg (S). Impressions: - Normal LV function; grade 1 diastolic dysfunction; mild LAE;   trace TR; mildly elevated pulmonary pressure.  Recent Labs: 12/16/2021: B Natriuretic Peptide 65.0; BUN 15; Creatinine, Ser 1.37; Hemoglobin 14.7; Platelets 168; Potassium 3.4; Sodium 137 01/08/2022: ALT 26; NT-Pro BNP 200  01/08/2022: Chol/HDL Ratio 3.1; Cholesterol, Total 153; HDL 50; LDL Chol Calc (NIH) 84;  Triglycerides 103   CrCl cannot be calculated (Patient's most recent lab result is older than the maximum 21 days allowed.).   Wt Readings from Last 3 Encounters:  02/13/22 221 lb 12.8 oz (100.6 kg)  01/08/22 217 lb (98.4 kg)  01/03/22 215 lb (97.5 kg)     Other studies reviewed: Additional studies/records reviewed today include: summarized above  ASSESSMENT AND PLAN:  PPM       Intact function, no changes made  2. HTN     Looks OK  3.HFpEF No symptoms or exam findings of volume OL Labs today  4. VT No changes today Labs today  5. CP Chronic/known for him Resume Imdur, BP should tolerate   Disposition: He sees Dr. Izora Ribas next month, we can see him back in 2-3 mo, sooner if needed     Current medicines are reviewed at length with the patient today.  The patient did not have any concerns regarding medicines.  Norma Fredrickson, PA-C 03/07/2022 4:21 AM     [facility] [address] [place] [address] [phone] (office)  [phone] (fax)

## 2022-03-09 ENCOUNTER — Other Ambulatory Visit: Payer: Self-pay | Admitting: Internal Medicine

## 2022-03-13 ENCOUNTER — Encounter (INDEPENDENT_AMBULATORY_CARE_PROVIDER_SITE_OTHER): Payer: Medicare Other | Admitting: Ophthalmology

## 2022-03-13 DIAGNOSIS — I10 Essential (primary) hypertension: Secondary | ICD-10-CM

## 2022-03-13 DIAGNOSIS — H35033 Hypertensive retinopathy, bilateral: Secondary | ICD-10-CM | POA: Diagnosis not present

## 2022-03-13 DIAGNOSIS — E113293 Type 2 diabetes mellitus with mild nonproliferative diabetic retinopathy without macular edema, bilateral: Secondary | ICD-10-CM | POA: Diagnosis not present

## 2022-03-13 DIAGNOSIS — H43813 Vitreous degeneration, bilateral: Secondary | ICD-10-CM | POA: Diagnosis not present

## 2022-03-23 ENCOUNTER — Other Ambulatory Visit: Payer: Self-pay | Admitting: Internal Medicine

## 2022-03-23 DIAGNOSIS — I25118 Atherosclerotic heart disease of native coronary artery with other forms of angina pectoris: Secondary | ICD-10-CM

## 2022-04-08 NOTE — Progress Notes (Unsigned)
Cardiology Office Note:    Date:  04/09/2022   ID:  [name] [name], DOB 07-01-1945, MRN [id number]  PCP:  [name], MD  [facility] Cardiologist:  Riley Lam MD [facility] Electrophysiologist:  Sherryl Manges, MD   CC:  Follow up CAD  History of Present Illness:    [name] [name] is a 75 y.o. male with a hx of DM with HTN, HLD, Non obstructive CAD, COPD, SSS s/p Medtronic PPM, AT seen by Dr. Graciela Husbands, CKD. Who presents for evaluation.   Seem by NP Jeraldine Loots 01/2019 and received CCTA for CP.  2022:  patient diuresed 15 lbs with improvement in his SOB.  Saw Dr. Graciela Husbands, but has leg swelling:  Stopped his thiazide diuretic.  Questions about volumes status, breathing, and CP lead to re-evaluation. Had uncomplicated eye surgery, patient had normal Stress (LV reported low EF) and subsequent normal EF by echo.  Had deferred aggressive heart attack prevention. 2023: Three ED visits for sepsis and AKI, diarrhea, and groin pain.  LDL above goal.  Rare CP when seeing Renee.  I started seeing his wife.  Patient notes that he is doing well.   Is pretty active per his daughter (she came to this visit). Has pain from head to toe because of orthopedic issues There are no interval hospital/ED visit.    Notes that his breathing is OK at rest but he gets winded with exertion. No weight gain or leg swelling.  No palpitations or syncope.  Past Medical History:  Diagnosis Date   Arthritis    Bradycardia    pacemaker - Medtronic Adapta #[id number] - December 22, 2005   Carpal tunnel syndrome on both sides    Carpal tunnel syndrome, bilateral    Chronic back pain    Chronic kidney disease    COPD (chronic obstructive pulmonary disease) (HCC)    Depression    Diabetes mellitus    type II controled with diet   GERD (gastroesophageal reflux disease)    Headache(784.0)    Hyperlipidemia    Hypertension    Hypertensive retinopathy    Insomnia    Neuromuscular disorder (HCC)    peripheral  neuropathy BLE   Pneumonia    Presence of permanent cardiac pacemaker    Wears glasses     Past Surgical History:  Procedure Laterality Date   BACK SURGERY     CARPAL TUNNEL RELEASE Right 03/24/2017   Procedure: Right CARPAL TUNNEL RELEASE;  Surgeon: Maeola Harman, MD;  Location: [facility] OR;  Service: Neurosurgery;  Laterality: Right;  Right CARPAL TUNNEL RELEASE   CATARACT EXTRACTION     EP IMPLANTABLE DEVICE N/A 06/27/2015   Procedure:  PPM Generator Changeout;  Surgeon: Duke Salvia, MD;  Location: [facility] INVASIVE CV LAB;  Service: Cardiovascular;  Laterality: N/A;   EYE SURGERY     HEMORRHOID SURGERY     INSERT / REPLACE / REMOVE PACEMAKER     MULTIPLE TOOTH EXTRACTIONS     PACEMAKER INSERTION  ?2007   TOTAL HIP ARTHROPLASTY Right 01/25/2013   Procedure: TOTAL HIP ARTHROPLASTY ANTERIOR APPROACH;  Surgeon: Velna Ochs, MD;  Location: [facility] OR;  Service: Orthopedics;  Laterality: Right;   ULNAR NERVE TRANSPOSITION Right 03/24/2017   Procedure: Right Ulnar nerve release;  Surgeon: Maeola Harman, MD;  Location: [facility] OR;  Service: Neurosurgery;  Laterality: Right;  Right Ulnar nerve release    Current Medications: Current Meds  Medication Sig   acetaminophen (TYLENOL) 650 MG CR tablet Take 1,300  mg daily as needed by mouth for pain.    aspirin 81 MG tablet Take 81 mg by mouth daily.   atorvastatin (LIPITOR) 80 MG tablet TAKE 1 TABLET BY MOUTH  DAILY   CALCIUM-MAGNESIUM-ZINC PO Take 1 capsule by mouth daily.   diltiazem (CARDIZEM SR) 120 MG 12 hr capsule Take 1 capsule by mouth once daily   docusate sodium (COLACE) 100 MG capsule Take 100 mg by mouth 2 (two) times daily.   empagliflozin (JARDIANCE) 10 MG TABS tablet Take 1 tablet (10 mg total) by mouth daily before breakfast.   ezetimibe (ZETIA) 10 MG tablet Take 1 tablet by mouth once daily   famotidine (PEPCID) 20 MG tablet TAKE ONE TABLET BY MOUTH ONCE DAILY AT BEDTIME   furosemide (LASIX) 40 MG tablet Take 1 tablet (40 mg total) by  mouth daily.   gabapentin (NEURONTIN) 300 MG capsule Take 300 mg by mouth 2 (two) times daily.   glimepiride (AMARYL) 1 MG tablet Take 1 tablet by mouth daily.   guaiFENesin (MUCINEX) 600 MG 12 hr tablet Take 1 tablet (600 mg total) by mouth 2 (two) times daily.   ipratropium-albuterol (DUONEB) 0.5-2.5 (3) MG/3ML SOLN Inhale 3 mLs into the lungs every 6 (six) hours as needed (wheezing or shortness of breath).   isosorbide mononitrate (IMDUR) 30 MG 24 hr tablet Take 1 tablet (30 mg total) by mouth daily.   levocetirizine (XYZAL) 5 MG tablet Take 5 mg at bedtime by mouth.    metoprolol tartrate (LOPRESSOR) 50 MG tablet Take 1 tablet (50 mg total) by mouth 2 (two) times daily.   montelukast (SINGULAIR) 10 MG tablet Take 10 mg at bedtime by mouth.    Multiple Vitamin (MULTIVITAMIN WITH MINERALS) TABS tablet Take 1 tablet by mouth daily.   nitroGLYCERIN (NITROSTAT) 0.4 MG SL tablet DISSOLVE ONE TABLET UNDER THE TONGUE EVERY 5 MINUTES AS NEEDED FOR CHEST PAIN.  DO NOT EXCEED A TOTAL OF 3 DOSES IN 15 MINUTES   olmesartan (BENICAR) 20 MG tablet Take 10 mg by mouth daily.   pantoprazole (PROTONIX) 40 MG tablet TAKE ONE TABLET BY MOUTH ONCE DAILY TAKE  30-60  MINUTES  BEFORE  FIRST  MEAL  OF  THE  DAY   predniSONE (DELTASONE) 10 MG tablet TAKE 1 TABLET BY MOUTH EVERY OTHER DAY   PROAIR HFA 108 (90 BASE) MCG/ACT inhaler Inhale 2 puffs into the lungs 4 (four) times daily as needed for wheezing or shortness of breath.   Respiratory Therapy Supplies (FLUTTER) DEVI Blow through 4 times per set, 3 sets per day when needed to clear lungs   TRELEGY ELLIPTA 100-62.5-25 MCG/ACT AEPB USE 1 INHALATION BY MOUTH DAILY     Allergies:   Penicillins, Lopressor [metoprolol tartrate], and Codeine   Social History   Socioeconomic History   Marital status: Legally Separated    Spouse name: Not on file   Number of children: 2   Years of education: `   Highest education level: Not on file  Occupational History    Occupation: Retired  Tobacco Use   Smoking status: Former    Packs/day: 2.00    Years: 15.00    Total pack years: 30.00    Types: Cigarettes    Start date: 05/05/1958    Quit date: 05/05/1973    Years since quitting: 48.9   Smokeless tobacco: Never  Vaping Use   Vaping Use: Never used  Substance and Sexual Activity   Alcohol use: No    Alcohol/week: 0.0  standard drinks of alcohol   Drug use: No   Sexual activity: Not on file  Other Topics Concern   Not on file  Social History Narrative   Not on file   Social Determinants of Health   Financial Resource Strain: Not on file  Food Insecurity: Not on file  Transportation Needs: No Transportation Needs (02/11/2022)   PRAPARE - Administrator, Civil Service (Medical): No    Lack of Transportation (Non-Medical): No  Physical Activity: Not on file  Stress: Not on file  Social Connections: Not on file     Family History: The patient's family history includes Allergies in an other family member; Asthma in his maternal aunt; Heart disease in his mother.  ROS:   Please see the history of present illness.    All other systems reviewed and are negative.  EKGs/Labs/Other Studies Reviewed:    The following studies were reviewed today:  EKG:  01/08/22: SR with 1st HB and AP with rare PVC; A Pacing and VS 05/08/20: Possible sinus rhythm (low amplitude P wave) 1st HB, iRBBB 04/24/20: SR with 1st HB rate 77 with PACs and PVCs  Cardiac Studies & Procedures     STRESS TESTS  MYOCARDIAL PERFUSION IMAGING 12/14/2020  Narrative  The left ventricular ejection fraction is mildly decreased (45-54%).  Nuclear stress EF: 40%.  There was no ST segment deviation noted during stress.  Defect 1: There is a medium defect of moderate severity present in the apex location.  This is an intermediate risk study.  No evidence of ischemia. There is a fixed apical defect, with normal wall motion in this area, suggestive of artifact.  Overall low cardiac counts with increased extracardiac activity. Mildly reduced EF without focal wall motion abnormalities.   ECHOCARDIOGRAM  ECHOCARDIOGRAM COMPLETE 02/01/2021  Narrative ECHOCARDIOGRAM REPORT    Patient Name:   JADER DESAI Date of Exam: 02/01/2021 Medical Rec #:  [id number]       Height:       72.0 in Accession #:    [id number]      Weight:       220.2 lb Date of Birth:  Mar 16, 1946       BSA:          2.219 m Patient Age:    75 years        BP:           132/84 mmHg Patient Gender: M               HR:           88 bpm. Exam Location:  [address]  Procedure: 2D Echo, Cardiac Doppler, Color Doppler and Intracardiac Opacification Agent  Indications:    I50.9 congestive heart failure  History:        Patient has prior history of Echocardiogram examinations, most recent 05/28/2020. COPD; Risk Factors:Hypertension, Diabetes and Dyslipidemia. Chronic kidney disease. Pneumonia.  Sonographer:    Daphine Deutscher RDCS Referring Phys: [id number] [facility] A Haylie Mccutcheon  IMPRESSIONS   1. Left ventricular ejection fraction, by estimation, is 50 to 55%. The left ventricle has low normal function. The left ventricle has no regional wall motion abnormalities. There is moderate asymmetric left ventricular hypertrophy of the basal-septal segment. Left ventricular diastolic parameters were normal. 2. Right ventricular systolic function is normal. The right ventricular size is normal. Tricuspid regurgitation signal is inadequate for assessing PA pressure. 3. The mitral valve is normal in structure. Trivial mitral valve regurgitation. No  evidence of mitral stenosis. 4. The aortic valve is tricuspid. Aortic valve regurgitation is not visualized. Mild aortic valve sclerosis is present, with no evidence of aortic valve stenosis. 5. Aortic dilatation noted. There is mild dilatation of the ascending aorta, measuring 38 mm.  FINDINGS Left Ventricle: Left ventricular ejection  fraction, by estimation, is 50 to 55%. The left ventricle has low normal function. The left ventricle has no regional wall motion abnormalities. Definity contrast agent was given IV to delineate the left ventricular endocardial borders. The left ventricular internal cavity size was normal in size. There is moderate asymmetric left ventricular hypertrophy of the basal-septal segment. Left ventricular diastolic parameters were normal.  Right Ventricle: The right ventricular size is normal. No increase in right ventricular wall thickness. Right ventricular systolic function is normal. Tricuspid regurgitation signal is inadequate for assessing PA pressure.  Left Atrium: Left atrial size was normal in size.  Right Atrium: Right atrial size was normal in size.  Pericardium: There is no evidence of pericardial effusion.  Mitral Valve: The mitral valve is normal in structure. Trivial mitral valve regurgitation. No evidence of mitral valve stenosis.  Tricuspid Valve: The tricuspid valve is normal in structure. Tricuspid valve regurgitation is not demonstrated.  Aortic Valve: The aortic valve is tricuspid. Aortic valve regurgitation is not visualized. Mild aortic valve sclerosis is present, with no evidence of aortic valve stenosis.  Pulmonic Valve: The pulmonic valve was grossly normal. Pulmonic valve regurgitation is trivial.  Aorta: The aortic root is normal in size and structure and aortic dilatation noted. There is mild dilatation of the ascending aorta, measuring 38 mm.  IAS/Shunts: The interatrial septum was not well visualized.   LEFT VENTRICLE PLAX 2D LVIDd:         5.10 cm  Diastology LVIDs:         3.70 cm  LV e' medial:    6.09 cm/s LV PW:         1.20 cm  LV E/e' medial:  13.1 LV IVS:        1.20 cm  LV e' lateral:   11.10 cm/s LVOT diam:     2.30 cm  LV E/e' lateral: 7.2 LV SV:         78 LV SV Index:   35 LVOT Area:     4.15 cm   RIGHT VENTRICLE             IVC RV Basal  diam:  3.70 cm     IVC diam: 1.90 cm RV S prime:     13.40 cm/s TAPSE (M-mode): 3.1 cm  LEFT ATRIUM             Index       RIGHT ATRIUM           Index LA diam:        4.60 cm 2.07 cm/m  RA Area:     13.00 cm LA Vol (A2C):   61.7 ml 27.80 ml/m RA Volume:   29.60 ml  13.34 ml/m LA Vol (A4C):   66.0 ml 29.74 ml/m LA Biplane Vol: 65.6 ml 29.56 ml/m AORTIC VALVE LVOT Vmax:   79.90 cm/s LVOT Vmean:  53.200 cm/s LVOT VTI:    0.188 m  AORTA Ao Root diam: 3.50 cm Ao Asc diam:  3.80 cm  MITRAL VALVE MV Area (PHT): 3.65 cm    SHUNTS MV Decel Time: 208 msec    Systemic VTI:  0.19 m MV E velocity: 79.70 cm/s  Systemic Diam: 2.30 cm MV A velocity: 85.40 cm/s MV E/A ratio:  0.93  Epifanio Lesches MD Electronically signed by Epifanio Lesches MD Signature Date/Time: 02/01/2021/11:33:54 AM    Final     CT SCANS  CT CORONARY MORPH W/CTA COR W/SCORE 03/01/2019  Addendum 03/01/2019  7:16 PM ADDENDUM REPORT: 03/01/2019 19:14  CLINICAL DATA:  20M with sick sinus syndrome, prior tobacco abuse, COPD, hypertension, diabetes, CKD and chest pain.  EXAM: Cardiac/Coronary  CT  TECHNIQUE: The patient was scanned on a [employer].  FINDINGS: A 120 kV prospective scan was triggered in the descending thoracic aorta at 111 HU's. Axial non-contrast 3 mm slices were carried out through the heart. The data set was analyzed on a dedicated work station and scored using the Agatson method. Gantry rotation speed was 250 msecs and collimation was .6 mm. No beta blockade and 0.8 mg of sl NTG was given. The 3D data set was reconstructed in 5% intervals of the 67-82 % of the R-R cycle. Diastolic phases were analyzed on a dedicated work station using MPR, MIP and VRT modes. The patient received 80 cc of contrast.  Aorta: Normal size. Ascending aorta 3.3 cm. Mild calcification of the descending aorta. No dissection.  Aortic Valve:  Trileaflet.  No calcifications.  Coronary  Arteries:  Normal coronary origin.  Right dominance.  RCA is a small, non-dominant artery.  There is no plaque.  Left main is a large artery that gives rise to LAD and LCX arteries. Minimal (<25%) calcified plaque.  LAD is a large vessel. There is minimal (<25%) mixed plaque in the ostium and mild (25-49%) mixed plaque proximally. There is a short segment of myocardial bridging distally. There is a large, branching D1 with minimal calcified plaque proximally and mild calcified plaque at the bifurcation. D2 and D3 are small and without significant stenosis.  LCX is a large, dominant artery that gives rise to one large OM1 branch. There is scattered minimal calcified plaque. There is a small OM1, medium OM2, large OM3 and small OM4 without evidence of significant disease.  Other findings:  Normal pulmonary vein drainage into the left atrium.  Normal let atrial appendage without a thrombus.  Normal size of the pulmonary artery.  Dual chamber pacemaker noted in the right atrium and right ventricle.  IMPRESSION: 1. Coronary calcium score of 117. This was 36th percentile for age and sex matched control.  2. Normal coronary origin with left dominance.  3. No non-obstructive CAD.  Chilton Si, MD   Electronically Signed By: Chilton Si On: 03/01/2019 19:14  Narrative EXAM: OVER-READ INTERPRETATION  CT CHEST  The following report is an over-read performed by radiologist Dr. Charlett Nose of [facility] on 03/01/2019. This over-read does not include interpretation of cardiac or coronary anatomy or pathology. The coronary CTA interpretation by the cardiologist is attached.  COMPARISON:  None.  FINDINGS: Vascular: Heart is normal size.  Visualized aorta normal caliber.  Mediastinum/Nodes: No adenopathy in the lower mediastinum or hila.  Lungs/Pleura: No confluent opacities or effusions.  Upper Abdomen: Imaging into the upper abdomen shows no  acute findings.  Musculoskeletal: Chest wall soft tissues are unremarkable. No acute bony abnormality.  IMPRESSION: No acute or significant extracardiac abnormality.  Electronically Signed: By: Charlett Nose M.D. On: 03/01/2019 15:01           Recent Labs: 12/16/2021: B Natriuretic Peptide 65.0; Hemoglobin 14.7; Platelets 168 01/08/2022: ALT 26; NT-Pro BNP 200 03/07/2022: BUN 15; Creatinine, Ser  1.29; Magnesium 2.2; Potassium 4.3; Sodium 140  Recent Lipid Panel    Component Value Date/Time   CHOL 153 01/08/2022 0852   TRIG 103 01/08/2022 0852   HDL 50 01/08/2022 0852   CHOLHDL 3.1 01/08/2022 0852   LDLCALC 84 01/08/2022 0852   Physical Exam:    VS:  BP 112/70   Pulse 76   Ht 6' (1.829 m)   Wt 221 lb (100.2 kg)   SpO2 95%   BMI 29.97 kg/m     Wt Readings from Last 3 Encounters:  04/09/22 221 lb (100.2 kg)  03/07/22 219 lb 12.8 oz (99.7 kg)  02/13/22 221 lb 12.8 oz (100.6 kg)    Gen: No distress Neck: No JVD Cardiac: No Rubs or Gallops, No Murmur, RRR and +2 radial pulses Respiratory: Clear to auscultation bilaterally, normal effort, normal  respiratory rate GI: Soft, nontender, non-distended  MS: No moves all extremities Integument: Skin feels warm Neuro:  At time of evaluation, alert and oriented to person/place/time/situation  Psych: Normal affect, patient feels well  ASSESSMENT:    1. Chronic heart failure with preserved ejection fraction (HCC)   2. Diabetes mellitus with coincident hypertension (HCC)   3. COPD with chronic bronchitis (HCC)   4. OSA (obstructive sleep apnea)   5. Coronary artery disease of native artery of native heart with stable angina pectoris (HCC)   6. Pacemaker- DDD-Medtronic     PLAN:    Heart Failure Preserved Ejection Fraction  HTN with DM - NYHA class II, Stage B, euvolemic  - Diuretic regimen: lasix 40 mg PO Daily   - Patient would not like to start additional medications if at all possible; reviewed this with daugther -  I have re-offered SGLT2i; they are now amenable - BMP and BNP in two weeks  - K goal of 4 - continue diltiazem 120   COPD; OSA on BIPAP - SOB - seeing PCCM - Patient declined cardiopulmonary rehab  PVCs prior VT Hx of atrial tachycardia MDT PPM per Dr. Graciela Husbands - seeing EP - continue BB as per EP  Coronary Artery Disease; Nonobstructive HLD with DM Aortic Atherosclerosis - asymptomatic  - anatomy: minimal non obstructive disease in 01/2019 - continue ASA 81 mg; - goal LDL < 70 ; on Zetia 10 and  atorvastatin 80 mg LDL improved but above goal; patient not presently amenable to new medications for heart attack prevention; we discussed diet interventions - continue Imdur  Six months APP, one year Me  Time Spent Directly with Patient:   I have spent a total of 40 minutes with the patient reviewing notes, imaging, EKGs, labs and examining the patient as well as establishing an assessment and plan that was discussed personally with the patient.  > 50% of time was spent in direct patient care and family and reviewing imaging with patient.     Medication Adjustments/Labs and Tests Ordered: Current medicines are reviewed at length with the patient today.  Concerns regarding medicines are outlined above.  Orders Placed This Encounter  Procedures   Basic metabolic panel   Pro b natriuretic peptide (BNP)    Meds ordered this encounter  Medications   empagliflozin (JARDIANCE) 10 MG TABS tablet    Sig: Take 1 tablet (10 mg total) by mouth daily before breakfast.    Dispense:  90 tablet    Refill:  3     Patient Instructions  Medication Instructions:  Your physician has recommended you make the following change in your medication:  START: Empagliflozin (Jardiance) 10 mg by mouth daily before breakfast  *If you need a refill on your cardiac medications before your next appointment, please call your pharmacy*   Lab Work: IN 2 WEEKS: BMP, BNP  If you have labs (blood work)  drawn today and your tests are completely normal, you will receive your results only by: MyChart Message (if you have MyChart) OR A paper copy in the mail If you have any lab test that is abnormal or we need to change your treatment, we will call you to review the results.   Testing/Procedures: NONE   Follow-Up: At [facility], you and your health needs are our priority.  As part of our continuing mission to provide you with exceptional heart care, we have created designated Provider Care Teams.  These Care Teams include your primary Cardiologist (physician) and Advanced Practice Providers (APPs -  Physician Assistants and Nurse Practitioners) who all work together to provide you with the care you need, when you need it.  We recommend signing up for the patient portal called "MyChart".  Sign up information is provided on this After Visit Summary.  MyChart is used to connect with patients for Virtual Visits (Telemedicine).  Patients are able to view lab/test results, encounter notes, upcoming appointments, etc.  Non-urgent messages can be sent to your provider as well.   To learn more about what you can do with MyChart, go to [email or web address].    Your next appointment:   6 month(s)  The format for your next appointment:   In Person  Provider:   Jari Favre, PA-C, Robin Searing, NP, or Eligha Bridegroom, NP        Important Information About Sugar         Signed, Christell Constant, MD  04/09/2022 9:04 AM    [facility]

## 2022-04-09 ENCOUNTER — Ambulatory Visit: Payer: Medicare Other | Attending: Internal Medicine | Admitting: Internal Medicine

## 2022-04-09 ENCOUNTER — Encounter: Payer: Self-pay | Admitting: Internal Medicine

## 2022-04-09 VITALS — BP 112/70 | HR 76 | Ht 72.0 in | Wt 221.0 lb

## 2022-04-09 DIAGNOSIS — I10 Essential (primary) hypertension: Secondary | ICD-10-CM

## 2022-04-09 DIAGNOSIS — J4489 Other specified chronic obstructive pulmonary disease: Secondary | ICD-10-CM

## 2022-04-09 DIAGNOSIS — I25118 Atherosclerotic heart disease of native coronary artery with other forms of angina pectoris: Secondary | ICD-10-CM

## 2022-04-09 DIAGNOSIS — I5032 Chronic diastolic (congestive) heart failure: Secondary | ICD-10-CM

## 2022-04-09 DIAGNOSIS — Z950 Presence of cardiac pacemaker: Secondary | ICD-10-CM

## 2022-04-09 DIAGNOSIS — G4733 Obstructive sleep apnea (adult) (pediatric): Secondary | ICD-10-CM

## 2022-04-09 DIAGNOSIS — E119 Type 2 diabetes mellitus without complications: Secondary | ICD-10-CM

## 2022-04-09 MED ORDER — EMPAGLIFLOZIN 10 MG PO TABS: 10.0000 mg | ORAL_TABLET | Freq: Every day | ORAL | 3 refills | Status: DC

## 2022-04-09 NOTE — Patient Instructions (Addendum)
Medication Instructions:  Your physician has recommended you make the following change in your medication:  START: Empagliflozin (Jardiance) 10 mg by mouth daily before breakfast We have provided you with 30 days of samples of Jardiance as well as a 14 day free trial and information for patient assistance.   *If you need a refill on your cardiac medications before your next appointment, please call your pharmacy*   Lab Work: IN 2 WEEKS: BMP, BNP  If you have labs (blood work) drawn today and your tests are completely normal, you will receive your results only by: MyChart Message (if you have MyChart) OR A paper copy in the mail If you have any lab test that is abnormal or we need to change your treatment, we will call you to review the results.   Testing/Procedures: NONE   Follow-Up: At [facility], you and your health needs are our priority.  As part of our continuing mission to provide you with exceptional heart care, we have created designated Provider Care Teams.  These Care Teams include your primary Cardiologist (physician) and Advanced Practice Providers (APPs -  Physician Assistants and Nurse Practitioners) who all work together to provide you with the care you need, when you need it.  We recommend signing up for the patient portal called "MyChart".  Sign up information is provided on this After Visit Summary.  MyChart is used to connect with patients for Virtual Visits (Telemedicine).  Patients are able to view lab/test results, encounter notes, upcoming appointments, etc.  Non-urgent messages can be sent to your provider as well.   To learn more about what you can do with MyChart, go to [email or web address].    Your next appointment:   6 month(s)  The format for your next appointment:   In Person  Provider:   Jari Favre, PA-C, Robin Searing, NP, or Eligha Bridegroom, NP        Important Information About Sugar

## 2022-04-15 ENCOUNTER — Other Ambulatory Visit: Payer: Self-pay | Admitting: Internal Medicine

## 2022-04-15 MED ORDER — DILTIAZEM HCL ER 120 MG PO CP12: 120.0000 mg | ORAL_CAPSULE | Freq: Every day | ORAL | 3 refills | Status: DC

## 2022-04-23 ENCOUNTER — Ambulatory Visit: Payer: Medicare Other | Attending: Internal Medicine

## 2022-04-23 DIAGNOSIS — I5032 Chronic diastolic (congestive) heart failure: Secondary | ICD-10-CM

## 2022-04-24 LAB — BASIC METABOLIC PANEL
BUN/Creatinine Ratio: 9 — ABNORMAL LOW (ref 10–24)
BUN: 12 mg/dL (ref 8–27)
CO2: 25 mmol/L (ref 20–29)
Calcium: 9.1 mg/dL (ref 8.6–10.2)
Chloride: 102 mmol/L (ref 96–106)
Creatinine, Ser: 1.38 mg/dL — ABNORMAL HIGH (ref 0.76–1.27)
Glucose: 122 mg/dL — ABNORMAL HIGH (ref 70–99)
Potassium: 4.5 mmol/L (ref 3.5–5.2)
Sodium: 142 mmol/L (ref 134–144)
eGFR: 52 mL/min/{1.73_m2} — ABNORMAL LOW (ref >59)

## 2022-04-24 LAB — PRO B NATRIURETIC PEPTIDE: NT-Pro BNP: 146 pg/mL (ref 0–486)

## 2022-05-02 ENCOUNTER — Telehealth: Payer: Self-pay | Admitting: Internal Medicine

## 2022-05-02 NOTE — Telephone Encounter (Signed)
Pt c/o medication issue:  1. Name of Medication: Jardiance  2. How are you currently taking this medication (dosage and times per day)? 1 times a day  3. Are you having a reaction (difficulty breathing--STAT)?   4. What is your medication issue? Diarrhea, vomiting, really weak and feels like one side he is hurting in the kidney area

## 2022-05-02 NOTE — Telephone Encounter (Signed)
Spoke with pt who states that he had diarrhea, vomiting on 04/30/22. Pt had taken his Jardiance since and no additional vomiting or diarrhea. Pt reports that the weakness started after having diarrhea and vomiting. Pt advised to call PCP or go to Urgent Care for evaluation of kidney pain as he has problems with urination at times. Pt verbalized understanding and had no additional questions.

## 2022-05-04 NOTE — Progress Notes (Signed)
Cardiology Office Note Date:  05/04/2022  Patient ID:  [name], [name] April 20, 1946, MRN [id number] PCP:  [name], MD  Electrophysiologist:  Dr. Graciela Husbands Cardiology: Dr. Izora Ribas Pulmonology: Dr. Maple Hudson   Chief Complaint:    *** 2-3 mo  History of Present Illness: [name] [name] is a 76 y.o. male with history of O2 dep COPD (follows regularly with Dr. Maple Hudson), restrictive lung disease, HTN, DM, HLD, bradycardia w/PPM, ATach, PVCs, HFpEF, VT  he saw Dr. Graciela Husbands Feb 2018, he was doing well, his note mention a low risk myoview done in 2014.  I saw him Feb 2019, he was being treated for recurrent bronchitis, outside of this was feeling well.  No CP, palpitations, no dizziness, near syncope or syncope.  He reported his PMD monitors and manages his cholesterol  He saw [name], NP, advised by his pulmonologist to see cardiology with c/o CP.  He was planned for coronary CT, prescribed s/l NTG. CT noted NOD.  I saw him Dec 2020 He is doing well.  He mentions the CP with very vigorous/peak "max" kind of activities, like chopping wood.  None since his last visit above.  NO dizzy spells, no near syncope or syncope.  He denise any palpitations, no rest SOB, no DOE with his ADLs or even moderately vigorous activities.   He mentions he has been transitioned from CPAP to BIPAP, and that Dr. Maple Hudson is weaning him from O2 now, even at night. Her reports his PMD does labs Q 6 months and manages his lipids. No changes were made.  He saw Dr. Graciela Husbands Feb 2022, discussed SOB, recent steroid taper via pulm for COPD exacerbation, struggles with CP, (noting no obstructive CAD by CT), mentioned waking with CP and poor taste in his mouth, on PPI and H2 blocker.  Advised taking protonix at HS. He was felt to be volume stable, though HCTZ changed to lasix. Noted PVC burden of 10% with preserved EF.  He last saw Dr. Izora Ribas 01/08/22.  Had 3 hospitalizations/ER visits this year  for sepsis and AKI,  diarrhea, and groin pain. Rare CP, no recent s/l NTG needs, doing OK Euvolemic, pt preferred to avoid more meds.discussed perhaps SGLT2i in the future, perhaps aldactone if hypokalemic. Continued on dilt (avoiding BB w/COPD)  He saw Dr. Graciela Husbands 02/13/22, c/o near syncope, no palpitations, not orthostatic, noted on device with having prolonged nonsustained and  Sustained ventricular tachycardia.  In the context of a normal LV function and nonobstructive coronary disease, we will start with beta-blocker metoprolol 50 twice daily.  To allow for blood pressure, we will discontinue his isosorbide in the absence of coronary disease and decrease his Benicar from 20--10. ---He is also volume overloaded.  His JVP as well as edema, we will increase his furosemide from 20--40. --There is a discussion as to the introducing SGLT2.  I would like to hold off on that as we try to get this ventricular arrhythmia under control.  We will have him come back in a month.  His PVC count about 12%, he has had frequent episodes of nonsustained and sustained VT (by time).  If in the next month that has not changed considerably, would introduce flecainide 100 twice daily.   I saw him 03/07/22 He feels well. Stays moving/busy. He denies any notable or new symptoms with the medication changes made. Reports good urine OP. No particular new or different SOB He did have some CP though, had finished blowing his yard/leaves, was wrapping  up the extension cord and developed central chest pressure.  Took a s/l NTG and settled. He has had some sharp/fleeting CP intermittently some days. No dizzy spells, near syncope or syncope. Device check was good, no VT His imdur resumed Planned for labs, keeping his appt with Dr. Izora Ribashandrasekhar as scheduled and EP in 2-3 mo, sooner if needed.  He saw Dr. Izora Ribashandrasekhar 04/09/22, feeling well, active, started on jardiance, planned for a 6 mo f/u  *** symptoms *** VT *** volume   Device  History: MDT dual chamber PPM implanted 2007 for SSS, chronotropic incompetence, gen change 2017  Past Medical History:  Diagnosis Date   Arthritis    Bradycardia    pacemaker - Medtronic Adapta #ADDRO1 - December 22, 2005   Carpal tunnel syndrome on both sides    Carpal tunnel syndrome, bilateral    Chronic back pain    Chronic kidney disease    COPD (chronic obstructive pulmonary disease) (HCC)    Depression    Diabetes mellitus    type II controled with diet   GERD (gastroesophageal reflux disease)    Headache(784.0)    Hyperlipidemia    Hypertension    Hypertensive retinopathy    Insomnia    Neuromuscular disorder (HCC)    peripheral neuropathy BLE   Pneumonia    Presence of permanent cardiac pacemaker    Wears glasses     Past Surgical History:  Procedure Laterality Date   BACK SURGERY     CARPAL TUNNEL RELEASE Right 03/24/2017   Procedure: Right CARPAL TUNNEL RELEASE;  Surgeon: Maeola HarmanStern, Joseph, MD;  [facility];  Service: Neurosurgery;  Laterality: Right;  Right CARPAL TUNNEL RELEASE   CATARACT EXTRACTION     EP IMPLANTABLE DEVICE N/A 06/27/2015   Procedure:  PPM Generator Changeout;  Surgeon: Duke SalviaSteven C Klein, MD;  [facility];  Service: Cardiovascular;  Laterality: N/A;   EYE SURGERY     HEMORRHOID SURGERY     INSERT / REPLACE / REMOVE PACEMAKER     MULTIPLE TOOTH EXTRACTIONS     PACEMAKER INSERTION  ?2007   TOTAL HIP ARTHROPLASTY Right 01/25/2013   Procedure: TOTAL HIP ARTHROPLASTY ANTERIOR APPROACH;  Surgeon: Velna OchsPeter G Dalldorf, MD;  Location: [facility] OR;  Service: Orthopedics;  Laterality: Right;   ULNAR NERVE TRANSPOSITION Right 03/24/2017   Procedure: Right Ulnar nerve release;  Surgeon: Maeola HarmanStern, Joseph, MD;  [facility];  Service: Neurosurgery;  Laterality: Right;  Right Ulnar nerve release    Current Outpatient Medications  Medication Sig Dispense Refill   acetaminophen (TYLENOL) 650 MG CR tablet Take 1,300 mg daily as needed by mouth for  pain.      aspirin 81 MG tablet Take 81 mg by mouth daily.     atorvastatin (LIPITOR) 80 MG tablet TAKE 1 TABLET BY MOUTH  DAILY 100 tablet 2   CALCIUM-MAGNESIUM-ZINC PO Take 1 capsule by mouth daily.     diltiazem (CARDIZEM SR) 120 MG 12 hr capsule Take 1 capsule (120 mg total) by mouth daily. 90 capsule 3   docusate sodium (COLACE) 100 MG capsule Take 100 mg by mouth 2 (two) times daily.     empagliflozin (JARDIANCE) 10 MG TABS tablet Take 1 tablet (10 mg total) by mouth daily before breakfast. 90 tablet 3   ezetimibe (ZETIA) 10 MG tablet Take 1 tablet by mouth once daily 90 tablet 3   famotidine (PEPCID) 20 MG tablet TAKE ONE TABLET BY MOUTH ONCE DAILY AT BEDTIME 30 tablet  0   furosemide (LASIX) 40 MG tablet Take 1 tablet (40 mg total) by mouth daily. 90 tablet 3   gabapentin (NEURONTIN) 300 MG capsule Take 300 mg by mouth 2 (two) times daily.     glimepiride (AMARYL) 1 MG tablet Take 1 tablet by mouth daily.     guaiFENesin (MUCINEX) 600 MG 12 hr tablet Take 1 tablet (600 mg total) by mouth 2 (two) times daily. 30 tablet 3   ipratropium-albuterol (DUONEB) 0.5-2.5 (3) MG/3ML SOLN Inhale 3 mLs into the lungs every 6 (six) hours as needed (wheezing or shortness of breath). 360 mL 12   isosorbide mononitrate (IMDUR) 30 MG 24 hr tablet Take 1 tablet (30 mg total) by mouth daily. 90 tablet 3   levocetirizine (XYZAL) 5 MG tablet Take 5 mg at bedtime by mouth.      metoprolol tartrate (LOPRESSOR) 50 MG tablet Take 1 tablet (50 mg total) by mouth 2 (two) times daily. 180 tablet 3   montelukast (SINGULAIR) 10 MG tablet Take 10 mg at bedtime by mouth.      Multiple Vitamin (MULTIVITAMIN WITH MINERALS) TABS tablet Take 1 tablet by mouth daily.     nitroGLYCERIN (NITROSTAT) 0.4 MG SL tablet DISSOLVE ONE TABLET UNDER THE TONGUE EVERY 5 MINUTES AS NEEDED FOR CHEST PAIN.  DO NOT EXCEED A TOTAL OF 3 DOSES IN 15 MINUTES 25 tablet 5   olmesartan (BENICAR) 20 MG tablet Take 10 mg by mouth daily.      pantoprazole (PROTONIX) 40 MG tablet TAKE ONE TABLET BY MOUTH ONCE DAILY TAKE  30-60  MINUTES  BEFORE  FIRST  MEAL  OF  THE  DAY 30 tablet 0   predniSONE (DELTASONE) 10 MG tablet TAKE 1 TABLET BY MOUTH EVERY OTHER DAY 60 tablet 3   PROAIR HFA 108 (90 BASE) MCG/ACT inhaler Inhale 2 puffs into the lungs 4 (four) times daily as needed for wheezing or shortness of breath.     Respiratory Therapy Supplies (FLUTTER) DEVI Blow through 4 times per set, 3 sets per day when needed to clear lungs 1 each 0   TRELEGY ELLIPTA 100-62.5-25 MCG/ACT AEPB USE 1 INHALATION BY MOUTH DAILY 180 each 3   No current facility-administered medications for this visit.    Allergies:   Penicillins, Lopressor [metoprolol tartrate], and Codeine   Social History:  The patient  reports that he quit smoking about 49 years ago. His smoking use included cigarettes. He started smoking about 64 years ago. He has a 30.00 pack-year smoking history. He has never used smokeless tobacco. He reports that he does not drink alcohol and does not use drugs.   Family History:  The patient's family history includes Allergies in an other family member; Asthma in his maternal aunt; Heart disease in his mother.  ROS:  Please see the history of present illness.  All other systems are reviewed and otherwise negative.   PHYSICAL EXAM:  VS:  There were no vitals taken for this visit. BMI: There is no height or weight on file to calculate BMI. Well nourished, well developed, in no acute distress  HEENT: normocephalic, atraumatic  Neck: no JVD, carotid bruits or masses Cardiac:  *** RRR; no significant murmurs, no rubs, or gallops Lungs: *** CTA b/l, no wheezing, rhonchi or rales  Abd: soft, nontender, obese MS: no deformity, age appropriate atrophy Ext: ***   no edema  Skin: warm and dry, no rash Neuro:  No gross deficits appreciated Psych: euthymic mood, full affect   ***  PPM site is stable, no tethering or discomfort   EKG:  Not done  today  PPM interrogation done today and reviewed by myself;  ***   02/01/2021: TTE  1. Left ventricular ejection fraction, by estimation, is 50 to 55%. The  left ventricle has low normal function. The left ventricle has no regional  wall motion abnormalities. There is moderate asymmetric left ventricular  hypertrophy of the basal-septal  segment. Left ventricular diastolic parameters were normal.   2. Right ventricular systolic function is normal. The right ventricular  size is normal. Tricuspid regurgitation signal is inadequate for assessing  PA pressure.   3. The mitral valve is normal in structure. Trivial mitral valve  regurgitation. No evidence of mitral stenosis.   4. The aortic valve is tricuspid. Aortic valve regurgitation is not  visualized. Mild aortic valve sclerosis is present, with no evidence of  aortic valve stenosis.   5. Aortic dilatation noted. There is mild dilatation of the ascending  aorta, measuring 38 mm.    03/01/2019: Coronary CTA IMPRESSION: 1. Coronary calcium score of 117. This was 36th percentile for age and sex matched control. 2. Normal coronary origin with left dominance. 3. No non-obstructive CAD.  08/29/14: TTE Study Conclusions - Left ventricle: The cavity size was normal. There was mild focal   basal hypertrophy of the septum. Systolic function was normal.   The estimated ejection fraction was in the range of 55% to 60%.   Wall motion was normal; there were no regional wall motion   abnormalities. Doppler parameters are consistent with abnormal   left ventricular relaxation (grade 1 diastolic dysfunction). - Left atrium: The atrium was mildly dilated. - Pulmonary arteries: Systolic pressure was mildly increased. PA   peak pressure: 34 mm Hg (S). Impressions: - Normal LV function; grade 1 diastolic dysfunction; mild LAE;   trace TR; mildly elevated pulmonary pressure.  Recent Labs: 12/16/2021: B Natriuretic Peptide 65.0; Hemoglobin 14.7;  Platelets 168 01/08/2022: ALT 26 03/07/2022: Magnesium 2.2 04/23/2022: BUN 12; Creatinine, Ser 1.38; NT-Pro BNP 146; Potassium 4.5; Sodium 142  01/08/2022: Chol/HDL Ratio 3.1; Cholesterol, Total 153; HDL 50; LDL Chol Calc (NIH) 84; Triglycerides 103   CrCl cannot be calculated (Unknown ideal weight.).   Wt Readings from Last 3 Encounters:  04/09/22 221 lb (100.2 kg)  03/07/22 219 lb 12.8 oz (99.7 kg)  02/13/22 221 lb 12.8 oz (100.6 kg)     Other studies reviewed: Additional studies/records reviewed today include: summarized above  ASSESSMENT AND PLAN:  PPM       ***  Intact function, no changes made  2. HTN     *** Looks OK  3.HFpEF *** No symptoms or exam findings of volume OL   4. VT ***  5. CP Chronic/known for him ***   Disposition: ***      Current medicines are reviewed at length with the patient today.  The patient did not have any concerns regarding medicines.  Norma Fredrickson, PA-C 05/04/2022 10:58 AM     [facility] [address] [place] [address] [phone] (office)  [phone] (fax)

## 2022-05-07 ENCOUNTER — Encounter: Payer: Self-pay | Admitting: Physician Assistant

## 2022-05-07 ENCOUNTER — Ambulatory Visit: Payer: Medicare Other | Attending: Physician Assistant | Admitting: Physician Assistant

## 2022-05-07 VITALS — BP 108/60 | HR 69 | Ht 72.0 in | Wt 214.6 lb

## 2022-05-07 DIAGNOSIS — I10 Essential (primary) hypertension: Secondary | ICD-10-CM

## 2022-05-07 DIAGNOSIS — I472 Ventricular tachycardia, unspecified: Secondary | ICD-10-CM

## 2022-05-07 DIAGNOSIS — I5032 Chronic diastolic (congestive) heart failure: Secondary | ICD-10-CM

## 2022-05-07 DIAGNOSIS — Z950 Presence of cardiac pacemaker: Secondary | ICD-10-CM | POA: Diagnosis not present

## 2022-05-07 LAB — CUP PACEART INCLINIC DEVICE CHECK
Battery Impedance: 652 Ohm
Battery Remaining Longevity: 74 mo
Battery Voltage: 2.77 V
Brady Statistic AP VP Percent: 2 %
Brady Statistic AP VS Percent: 97 %
Brady Statistic AS VP Percent: 0 %
Brady Statistic AS VS Percent: 1 %
Date Time Interrogation Session: 20240103100852
Implantable Lead Connection Status: 753985
Implantable Lead Connection Status: 753985
Implantable Lead Implant Date: 20070820
Implantable Lead Implant Date: 20070820
Implantable Lead Location: 753859
Implantable Lead Location: 753860
Implantable Lead Model: 4092
Implantable Lead Model: 5076
Implantable Pulse Generator Implant Date: 20170222
Lead Channel Impedance Value: 467 Ohm
Lead Channel Impedance Value: 596 Ohm
Lead Channel Pacing Threshold Amplitude: 1 V
Lead Channel Pacing Threshold Amplitude: 1 V
Lead Channel Pacing Threshold Amplitude: 1.125 V
Lead Channel Pacing Threshold Amplitude: 1.125 V
Lead Channel Pacing Threshold Pulse Width: 0.4 ms
Lead Channel Pacing Threshold Pulse Width: 0.4 ms
Lead Channel Pacing Threshold Pulse Width: 0.4 ms
Lead Channel Pacing Threshold Pulse Width: 0.4 ms
Lead Channel Sensing Intrinsic Amplitude: 4 mV
Lead Channel Setting Pacing Amplitude: 2.25 V
Lead Channel Setting Pacing Amplitude: 2.5 V
Lead Channel Setting Pacing Pulse Width: 0.4 ms
Lead Channel Setting Sensing Sensitivity: 1 mV
Zone Setting Status: 755011
Zone Setting Status: 755011

## 2022-05-07 MED ORDER — FUROSEMIDE 20 MG PO TABS: 20.0000 mg | ORAL_TABLET | Freq: Every day | ORAL | 3 refills | Status: DC

## 2022-05-07 MED ORDER — FLECAINIDE ACETATE 100 MG PO TABS: 100.0000 mg | ORAL_TABLET | Freq: Two times a day (BID) | ORAL | 3 refills | Status: DC

## 2022-05-07 NOTE — Patient Instructions (Addendum)
Medication Instructions:   START  TAKING : FLECAINIDE 100 MG TWICE A DAY   START TAKING :  FUROSEMIDE 20 MG ONCE  AD DAY    *If you need a refill on your cardiac medications before your next appointment, please call your pharmacy*   Lab Work: NONE ORDERED  TODAY    If you have labs (blood work) drawn today and your tests are completely normal, you will receive your results only by: [place] (if you have MyChart) OR A paper copy in the mail If you have any lab test that is abnormal or we need to change your treatment, we will call you to review the results.   Testing/Procedures: NONE ORDERED  TODAY    Follow-Up: At [facility], you and your health needs are our priority.  As part of our continuing mission to provide you with exceptional heart care, we have created designated Provider Care Teams.  These Care Teams include your primary Cardiologist (physician) and Advanced Practice Providers (APPs -  Physician Assistants and Nurse Practitioners) who all work together to provide you with the care you need, when you need it.  We recommend signing up for the patient portal called "MyChart".  Sign up information is provided on this After Visit Summary.  MyChart is used to connect with patients for Virtual Visits (Telemedicine).  Patients are able to view lab/test results, encounter notes, upcoming appointments, etc.  Non-urgent messages can be sent to your provider as well.   To learn more about what you can do with MyChart, go to [email or web address].    Your next appointment:  NURSE VISIT WITH EKG  7-10 DAYS  1 month(s)  The format for your next appointment:   In Person  Provider:   You may see Virl Axe, MD or one of the following Advanced Practice Providers on your designated Care Team:   Tommye Standard, [place] Legrand Como "Jonni Sanger" Chalmers Cater, [place]    Important Information About Sugar

## 2022-05-16 ENCOUNTER — Ambulatory Visit: Payer: 59 | Attending: Cardiovascular Disease | Admitting: Cardiovascular Disease

## 2022-05-16 VITALS — BP 126/70 | HR 83 | Resp 20 | Wt 218.1 lb

## 2022-05-16 DIAGNOSIS — I493 Ventricular premature depolarization: Secondary | ICD-10-CM | POA: Diagnosis not present

## 2022-05-16 DIAGNOSIS — Z79899 Other long term (current) drug therapy: Secondary | ICD-10-CM

## 2022-05-16 NOTE — Patient Instructions (Addendum)
   Nurse Visit   Date of Encounter: 05/16/2022 ID: FREMONT SKALICKY, DOB November 01, 1945, MRN [id number]  PCP:  Shirline Frees, [place] Prov[facility] Cardiologist:  Werner Lean, MD Electrophysiologist:  Virl Axe, MD      Visit Details   VS:  BP 126/70 Comment: Left arm  Pulse 83   Resp 20   Wt 218 lb 0.8 oz (98.9 kg)   SpO2 95%   BMI 29.57 kg/m  , BMI Body mass index is 29.57 kg/m.  Wt Readings from Last 3 Encounters:  05/16/22 218 lb 0.8 oz (98.9 kg)  05/07/22 214 lb 9.6 oz (97.3 kg)  04/09/22 221 lb (100.2 kg)     Reason for visit: RN visit / EKG check; New start Flecainide 100 mg BID Performed today:  RN visit, Vitals, EKG, Provider consulted:DOD, Dr. Acie Fredrickson, and Education Changes (medications, testing, etc.) : Flecainide 100 mg, BID Length of Visit: 30 minutes    Medications Adjustments/Labs and Tests Ordered: Orders Placed This Encounter  Procedures   EKG 12-Lead   [facility] [address] today, 05/16/2022 at 1030 am, for an RN visit / EKG assessment.  Pt started taking Flecainide 100 mg, BID on 05/07/2022 by Ms. Tommye Standard, PA-C.  Pt saw Dr. Virl Axe for near syncope, increased PVC burden, and PMH of VT on 02/13/2022.  Pt has a MDT dual chamber PPM.  Pt vital signs were WNL at time of assessment.  Pt EKG: ventricular rate of 64, PR interval 332, QRS duration 120, QT/QTC 428/441.  EKG taken to DOD, Dr. Acie Fredrickson for review.  Per Dr. Acie Fredrickson, there was a less than 25% change detected with taking Flecainide per this EKG.  Dr. Acie Fredrickson recommends a possible stress test at Mr Sula Rumple appointment.  Pt continue current dose of Flecainide.  Will forward results to Tommye Standard, PA-C, and scan EKG to Pt chart.  Pt has appointment with Mr. Oda Kilts PA-C on 06/09/2022.     Signed, Varney Daily, RN  05/16/2022 11:23 AM

## 2022-06-05 NOTE — Progress Notes (Signed)
Electrophysiology Office Note Date: 06/09/2022  ID:  Coury, Grieger [birth year], MRN [id number]  PCP: Shirline Frees, MD Primary Cardiologist: Werner Lean, MD Electrophysiologist: Virl Axe, MD   CC: Pacemaker follow-up  [name] [name] is a 77 y.o. male seen today for Virl Axe, MD for routine electrophysiology followup after being started on flecainide last visit. Since last being seen in our clinic the patient reports doing well overall.  he denies chest pain, palpitations, dyspnea, PND, orthopnea, nausea, vomiting, dizziness, syncope, edema, weight gain, or early satiety.   Device History: Medtronic Dual Chamber PPM implanted 2007 for SSS, chronotropic incompetence, gen change 2017  Past Medical History:  Diagnosis Date   Arthritis    Bradycardia    pacemaker - Medtronic Adapta #ADDRO1 - December 22, 2005   Carpal tunnel syndrome on both sides    Carpal tunnel syndrome, bilateral    Chronic back pain    Chronic kidney disease    COPD (chronic obstructive pulmonary disease) ([facility])    Depression    Diabetes mellitus    type II controled with diet   GERD (gastroesophageal reflux disease)    Headache(784.0)    Hyperlipidemia    Hypertension    Hypertensive retinopathy    Insomnia    Neuromuscular disorder (HCC)    peripheral neuropathy BLE   Pneumonia    Presence of permanent cardiac pacemaker    Wears glasses     Current Outpatient Medications  Medication Instructions   acetaminophen (TYLENOL) 1,300 mg, Oral, Daily PRN   aspirin 81 mg, Oral, Daily   atorvastatin (LIPITOR) 80 MG tablet TAKE 1 TABLET BY MOUTH  DAILY   CALCIUM-MAGNESIUM-ZINC PO 1 capsule, Oral, Daily   diltiazem (CARDIZEM SR) 120 mg, Oral, Daily   docusate sodium (COLACE) 100 mg, Oral, 2 times daily   empagliflozin (JARDIANCE) 10 mg, Oral, Daily before breakfast   ezetimibe (ZETIA) 10 mg, Oral, Daily   famotidine (PEPCID) 20 MG tablet TAKE ONE TABLET BY MOUTH ONCE DAILY AT  BEDTIME   flecainide (TAMBOCOR) 100 mg, Oral, 2 times daily   furosemide (LASIX) 20 mg, Oral, Daily   gabapentin (NEURONTIN) 300 mg, Oral, 2 times daily   glimepiride (AMARYL) 1 MG tablet 1 tablet, Oral, Daily   guaiFENesin (MUCINEX) 600 mg, Oral, 2 times daily   ipratropium-albuterol (DUONEB) 0.5-2.5 (3) MG/3ML SOLN 3 mLs, Inhalation, Every 6 hours PRN   isosorbide mononitrate (IMDUR) 30 mg, Oral, Daily   Lancets (ONETOUCH DELICA PLUS CVELFY10F) MISC Daily   levocetirizine (XYZAL) 5 mg, Oral, Daily at bedtime   metoprolol tartrate (LOPRESSOR) 50 mg, Oral, 2 times daily   montelukast (SINGULAIR) 10 mg, Oral, Daily at bedtime   Multiple Vitamin (MULTIVITAMIN WITH MINERALS) TABS tablet 1 tablet, Oral, Daily   nitroGLYCERIN (NITROSTAT) 0.4 MG SL tablet DISSOLVE ONE TABLET UNDER THE TONGUE EVERY 5 MINUTES AS NEEDED FOR CHEST PAIN.  DO NOT EXCEED A TOTAL OF 3 DOSES IN 15 MINUTES   olmesartan (BENICAR) 10 mg, Oral, Daily   ONETOUCH VERIO test strip 1 each, Other, Daily   pantoprazole (PROTONIX) 40 MG tablet TAKE ONE TABLET BY MOUTH ONCE DAILY TAKE  30-60  MINUTES  BEFORE  FIRST  MEAL  OF  THE  DAY   predniSONE (DELTASONE) 10 MG tablet TAKE 1 TABLET BY MOUTH EVERY OTHER DAY   PROAIR HFA 108 (90 BASE) MCG/ACT inhaler 2 puffs, Inhalation, 4 times daily PRN   Respiratory Therapy Supplies (FLUTTER) DEVI Blow through 4 times  per set, 3 sets per day when needed to clear lungs   TRELEGY ELLIPTA 100-62.5-25 MCG/ACT AEPB USE 1 INHALATION BY MOUTH DAILY    Family History: Family History  Problem Relation Age of Onset   Heart disease Mother    Asthma Maternal Aunt    Allergies Other        whole family    Physical Exam: Vitals:   06/09/22 0837  BP: 122/82  Pulse: 94  SpO2: 96%  Weight: 220 lb (99.8 kg)  Height: 6' (1.829 m)     GEN- NAD. A&O x 3. Normal affect HEENT: Normocephalic, atraumatic Lungs- CTAB, Normal effort.  Heart- Regular rate and rhythm rate and rhythm. No M/G/R.   Extremities- No peripheral edema. no clubbing or cyanosis Skin- warm and dry, no rash or lesion, PPM pocket well healed.  PPM Interrogation-  reviewed in detail today,  See PACEART report.  EKG is ordered today. Personal review shows A pacing, prolonged 1st degree AV block (AV delay set to 250)   Other studies Reviewed: Additional studies/ records that were reviewed today include: Previous EP office notes, Previous remote checks, Most recent labwork.    Assessment and Plan:  1. Sick sinus syndrome s/p Medtronic PPM  Normal PPM function See Pace Art report No changes today  2. VT No further since on flecainide Continue flecainide 100 mg BID , Follow up EKG showed A paced rhythm . EKG today shows atrial pacing. AV delays are programmed long to limit V pacing  3. CP Not a current complaint No ischemia/or evidence of infarct on stress test 12/2020 Preserved LVEF 02/01/21  Current medicines are reviewed at length with the patient today.    Labs/ tests ordered today include:  Orders Placed This Encounter  Procedures   EKG 12-Lead     Disposition:   Follow up with Dr. Caryl Comes in 6 months   Signed, Shirley Friar, PA-C  06/09/2022 8:54 AM  [facility] [address] [place] [address] [phone] (office) [phone] (fax)

## 2022-06-09 ENCOUNTER — Ambulatory Visit: Payer: 59 | Attending: Student | Admitting: Student

## 2022-06-09 ENCOUNTER — Encounter: Payer: Self-pay | Admitting: Student

## 2022-06-09 VITALS — BP 122/82 | HR 94 | Ht 72.0 in | Wt 220.0 lb

## 2022-06-09 DIAGNOSIS — I495 Sick sinus syndrome: Secondary | ICD-10-CM

## 2022-06-09 DIAGNOSIS — I472 Ventricular tachycardia, unspecified: Secondary | ICD-10-CM | POA: Diagnosis not present

## 2022-06-09 DIAGNOSIS — Z950 Presence of cardiac pacemaker: Secondary | ICD-10-CM

## 2022-06-09 DIAGNOSIS — I10 Essential (primary) hypertension: Secondary | ICD-10-CM | POA: Diagnosis not present

## 2022-06-09 DIAGNOSIS — I493 Ventricular premature depolarization: Secondary | ICD-10-CM | POA: Diagnosis not present

## 2022-06-09 LAB — CUP PACEART INCLINIC DEVICE CHECK
Battery Impedance: 702 Ohm
Battery Remaining Longevity: 62 mo
Battery Voltage: 2.76 V
Brady Statistic AP VP Percent: 5 %
Brady Statistic AP VS Percent: 94 %
Brady Statistic AS VP Percent: 0 %
Brady Statistic AS VS Percent: 1 %
Date Time Interrogation Session: 20240205085542
Implantable Lead Connection Status: 753985
Implantable Lead Connection Status: 753985
Implantable Lead Implant Date: 20070820
Implantable Lead Implant Date: 20070820
Implantable Lead Location: 753859
Implantable Lead Location: 753860
Implantable Lead Model: 4092
Implantable Lead Model: 5076
Implantable Pulse Generator Implant Date: 20170222
Lead Channel Impedance Value: 487 Ohm
Lead Channel Impedance Value: 604 Ohm
Lead Channel Pacing Threshold Amplitude: 0.5 V
Lead Channel Pacing Threshold Amplitude: 0.75 V
Lead Channel Pacing Threshold Amplitude: 0.875 V
Lead Channel Pacing Threshold Amplitude: 1.375 V
Lead Channel Pacing Threshold Pulse Width: 0.4 ms
Lead Channel Pacing Threshold Pulse Width: 0.4 ms
Lead Channel Pacing Threshold Pulse Width: 0.4 ms
Lead Channel Pacing Threshold Pulse Width: 0.4 ms
Lead Channel Setting Pacing Amplitude: 2.5 V
Lead Channel Setting Pacing Amplitude: 2.75 V
Lead Channel Setting Pacing Pulse Width: 0.4 ms
Lead Channel Setting Sensing Sensitivity: 1 mV
Zone Setting Status: 755011
Zone Setting Status: 755011

## 2022-06-09 IMAGING — DX DG CHEST 1V PORT
1 series · 1 of 1 positions shown · non-contrast
Comparison: 04/24/2020.

CLINICAL DATA: Possible sepsis. Fever, cough, chills, and vomiting.

EXAM:
PORTABLE CHEST 1 VIEW

[chest ap]
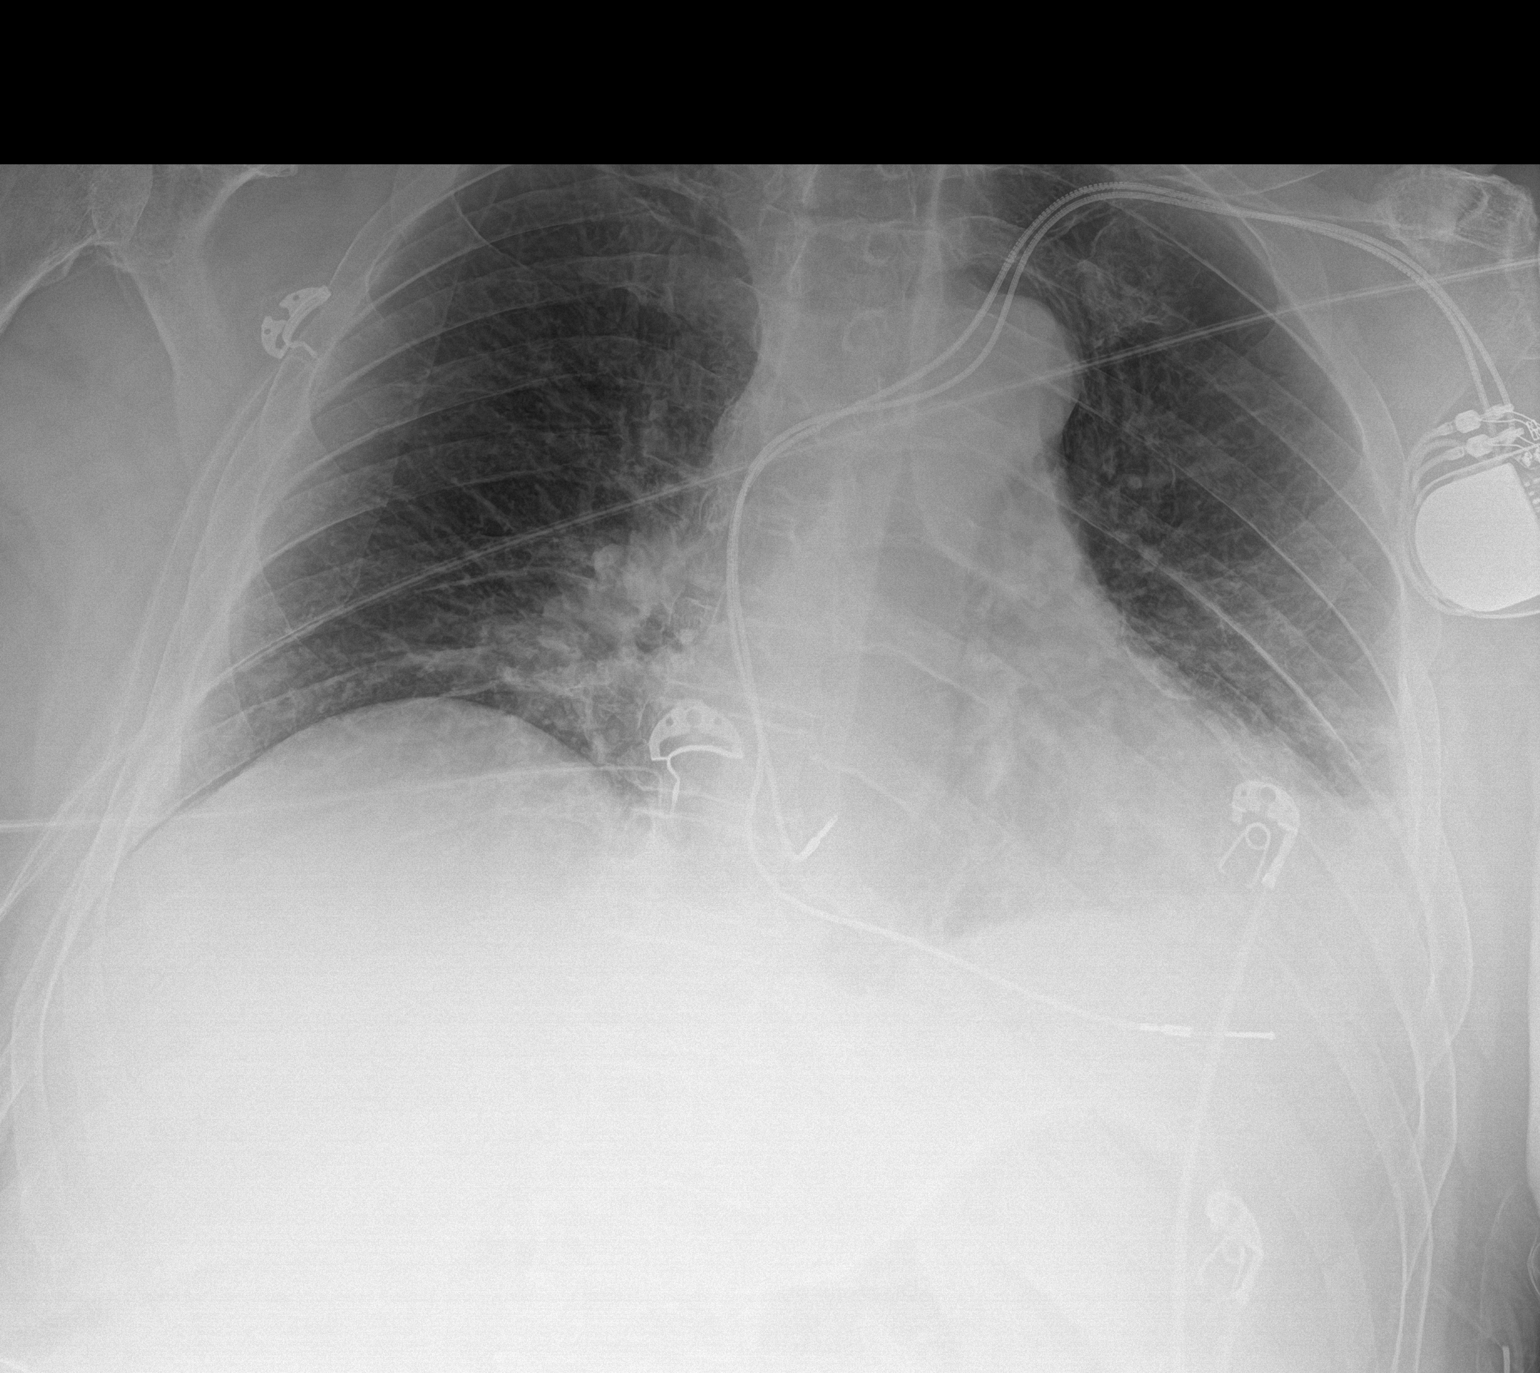

[1 of 1 positions shown; findings below may reference images not displayed]

FINDINGS: The heart is enlarged and a dual lead pacemaker is present over the
left chest. The mediastinal contour is within normal limits. Lung
volumes are low resulting in vascular crowding. Patchy airspace
disease is present at the left lung base. No definite effusion or
pneumothorax. No acute osseous abnormality.
IMPRESSION: Patchy airspace disease at the left lung base, possible atelectasis
or infiltrate.

## 2022-06-09 NOTE — Progress Notes (Signed)
See note from triage RN.    Mertie Moores, MD  06/09/2022 7:39 AM    [place],  [place], [place]  [address] Phone: [phone]; Fax: [phone]

## 2022-06-09 NOTE — Patient Instructions (Signed)
Medication Instructions:  Your physician recommends that you continue on your current medications as directed. Please refer to the Current Medication list given to you today.  *If you need a refill on your cardiac medications before your next appointment, please call your pharmacy*  Lab Work: None If you have labs (blood work) drawn today and your tests are completely normal, you will receive your results only by: MyChart Message (if you have MyChart) OR A paper copy in the mail If you have any lab test that is abnormal or we need to change your treatment, we will call you to review the results.  Follow-Up: At [facility], you and your health needs are our priority.  As part of our continuing mission to provide you with exceptional heart care, we have created designated Provider Care Teams.  These Care Teams include your primary Cardiologist (physician) and Advanced Practice Providers (APPs -  Physician Assistants and Nurse Practitioners) who all work together to provide you with the care you need, when you need it.   Your next appointment:   6 month(s)  Provider:   Steven Klein, MD 

## 2022-07-08 ENCOUNTER — Other Ambulatory Visit: Payer: Self-pay | Admitting: Internal Medicine

## 2022-07-15 NOTE — Progress Notes (Unsigned)
Cardiology Office Note:    Date:  07/16/2022   ID:  [name] [name], DOB 1945/06/17, MRN [id number]  PCP:  Shirline Frees, MD  [facility] Cardiologist:  Rudean Haskell MD [place] Electrophysiologist:  Virl Axe, MD   CC:  Follow up CAD, HFpEF  History of Present Illness:    [name] [name] is a 77 y.o. male with a hx of DM with HTN, HLD, Non obstructive CAD, COPD, SSS s/p Medtronic PPM, AT seen by Dr. Caryl Comes, CKD. Who presents for evaluation.   Seem by NP Phylliss Bob 01/2019 and received CCTA for CP.  2022:  patient diuresed 15 lbs with improvement in his SOB.  Saw Dr. Caryl Comes, but has leg swelling:  Stopped his thiazide diuretic.  Questions about volumes status, breathing, and CP lead to re-evaluation. Had uncomplicated eye surgery, patient had normal Stress (LV reported low EF) and subsequent normal EF by echo.  Had deferred aggressive heart attack prevention. 2023: Three ED visits for sepsis and AKI, diarrhea, and groin pain.  LDL above goal.  Rare CP when seeing Renee.  I started seeing his wife.  Started on flecainide 2024: doing well on flecainide; daughter now coming to visits (birthday coming up)  Patient notes that he is doing fair.   Since last visit notes that he is having some new chest pain with . There are no interval hospital/ED visit.    He notes that he was having chest pain when working on something around the house with his granddaugther who bought a fixer upper.  Needed Nitro and rest for resolution. No SOB.  LE is the best it has been since 2022 (started Giardiance)   Past Medical History:  Diagnosis Date   Arthritis    Bradycardia    pacemaker - Medtronic Adapta #[id number] - December 22, 2005   Carpal tunnel syndrome on both sides    Carpal tunnel syndrome, bilateral    Chronic back pain    Chronic kidney disease    COPD (chronic obstructive pulmonary disease) ([place])    Depression    Diabetes mellitus    type II controled with diet   GERD  (gastroesophageal reflux disease)    Headache(784.0)    Hyperlipidemia    Hypertension    Hypertensive retinopathy    Insomnia    Neuromuscular disorder ([place])    peripheral neuropathy BLE   Pneumonia    Presence of permanent cardiac pacemaker    Wears glasses     Past Surgical History:  Procedure Laterality Date   BACK SURGERY     CARPAL TUNNEL RELEASE Right 03/24/2017   Procedure: Right CARPAL TUNNEL RELEASE;  Surgeon: Erline Levine, MD;  Location: [place];  Service: Neurosurgery;  Laterality: Right;  Right CARPAL TUNNEL RELEASE   CATARACT EXTRACTION     EP IMPLANTABLE DEVICE N/A 06/27/2015   Procedure:  PPM Generator Changeout;  Surgeon: Deboraha Sprang, MD;  Location: [place] [facility];  Service: Cardiovascular;  Laterality: N/A;   EYE SURGERY     HEMORRHOID SURGERY     INSERT / REPLACE / REMOVE PACEMAKER     MULTIPLE TOOTH EXTRACTIONS     PACEMAKER INSERTION  ?2007   TOTAL HIP ARTHROPLASTY Right 01/25/2013   Procedure: TOTAL HIP ARTHROPLASTY ANTERIOR APPROACH;  Surgeon: Hessie Dibble, MD;  Location: [place];  Service: Orthopedics;  Laterality: Right;   ULNAR NERVE TRANSPOSITION Right 03/24/2017   Procedure: Right Ulnar nerve release;  Surgeon: Erline Levine, MD;  Location: [place];  Service: Neurosurgery;  Laterality: Right;  Right Ulnar nerve release    Current Medications: Current Meds  Medication Sig   acetaminophen (TYLENOL) 650 MG CR tablet Take 1,300 mg daily as needed by mouth for pain.    aspirin 81 MG tablet Take 81 mg by mouth daily.   atorvastatin (LIPITOR) 80 MG tablet TAKE 1 TABLET BY MOUTH ONCE  DAILY   CALCIUM-MAGNESIUM-ZINC PO Take 1 capsule by mouth daily.   diltiazem (CARDIZEM SR) 120 MG 12 hr capsule Take 1 capsule (120 mg total) by mouth daily.   docusate sodium (COLACE) 100 MG capsule Take 100 mg by mouth 2 (two) times daily.   empagliflozin (JARDIANCE) 10 MG TABS tablet Take 1 tablet (10 mg total) by mouth daily before breakfast.   ezetimibe (ZETIA) 10  MG tablet Take 1 tablet by mouth once daily   famotidine (PEPCID) 20 MG tablet TAKE ONE TABLET BY MOUTH ONCE DAILY AT BEDTIME   flecainide (TAMBOCOR) 100 MG tablet Take 1 tablet (100 mg total) by mouth 2 (two) times daily.   furosemide (LASIX) 20 MG tablet Take 1 tablet (20 mg total) by mouth daily.   gabapentin (NEURONTIN) 300 MG capsule Take 300 mg by mouth 2 (two) times daily.   glimepiride (AMARYL) 1 MG tablet Take 1 tablet by mouth daily.   guaiFENesin (MUCINEX) 600 MG 12 hr tablet Take 1 tablet (600 mg total) by mouth 2 (two) times daily.   ipratropium-albuterol (DUONEB) 0.5-2.5 (3) MG/3ML SOLN Inhale 3 mLs into the lungs every 6 (six) hours as needed (wheezing or shortness of breath).   isosorbide mononitrate (IMDUR) 60 MG 24 hr tablet Take 1 tablet (60 mg total) by mouth daily.   Lancets (ONETOUCH DELICA PLUS 123XX123) MISC daily at 6 (six) AM.   levocetirizine (XYZAL) 5 MG tablet Take 5 mg at bedtime by mouth.    metoprolol tartrate (LOPRESSOR) 50 MG tablet Take 1 tablet (50 mg total) by mouth 2 (two) times daily.   montelukast (SINGULAIR) 10 MG tablet Take 10 mg at bedtime by mouth.    Multiple Vitamin (MULTIVITAMIN WITH MINERALS) TABS tablet Take 1 tablet by mouth daily.   nitroGLYCERIN (NITROSTAT) 0.4 MG SL tablet DISSOLVE ONE TABLET UNDER THE TONGUE EVERY 5 MINUTES AS NEEDED FOR CHEST PAIN.  DO NOT EXCEED A TOTAL OF 3 DOSES IN 15 MINUTES   olmesartan (BENICAR) 20 MG tablet Take 10 mg by mouth daily.   ONETOUCH VERIO test strip 1 each by Other route daily.   pantoprazole (PROTONIX) 40 MG tablet TAKE ONE TABLET BY MOUTH ONCE DAILY TAKE  30-60  MINUTES  BEFORE  FIRST  MEAL  OF  THE  DAY   predniSONE (DELTASONE) 10 MG tablet TAKE 1 TABLET BY MOUTH EVERY OTHER DAY   PROAIR HFA 108 (90 BASE) MCG/ACT inhaler Inhale 2 puffs into the lungs 4 (four) times daily as needed for wheezing or shortness of breath.   Respiratory Therapy Supplies (FLUTTER) DEVI Blow through 4 times per set, 3 sets per  day when needed to clear lungs   TRELEGY ELLIPTA 100-62.5-25 MCG/ACT AEPB USE 1 INHALATION BY MOUTH DAILY   [DISCONTINUED] isosorbide mononitrate (IMDUR) 30 MG 24 hr tablet Take 1 tablet (30 mg total) by mouth daily.     Allergies:   Penicillins and Codeine   Social History   Socioeconomic History   Marital status: Legally Separated    Spouse name: Not on file   Number of children: 2   Years of education: `  Highest education level: Not on file  Occupational History   Occupation: Retired  Tobacco Use   Smoking status: Former    Packs/day: 2.00    Years: 15.00    Total pack years: 30.00    Types: Cigarettes    Start date: 05/05/1958    Quit date: 05/05/1973    Years since quitting: 49.2   Smokeless tobacco: Never  Vaping Use   Vaping Use: Never used  Substance and Sexual Activity   Alcohol use: No    Alcohol/week: 0.0 standard drinks of alcohol   Drug use: No   Sexual activity: Not on file  Other Topics Concern   Not on file  Social History Narrative   Not on file   Social Determinants of Health   Financial Resource Strain: Not on file  Food Insecurity: Not on file  Transportation Needs: No Transportation Needs (02/11/2022)   PRAPARE - Hydrologist (Medical): No    Lack of Transportation (Non-Medical): No  Physical Activity: Not on file  Stress: Not on file  Social Connections: Not on file     Family History: The patient's family history includes Allergies in an other family member; Asthma in his maternal aunt; Heart disease in his mother.  ROS:   Please see the history of present illness.    All other systems reviewed and are negative.  EKGs/Labs/Other Studies Reviewed:    The following studies were reviewed today:  EKG:  01/08/22: SR with 1st HB and AP with rare PVC; A Pacing and VS 05/08/20: Possible sinus rhythm (low amplitude P wave) 1st HB, iRBBB 04/24/20: SR with 1st HB rate 77 with PACs and PVCs  Cardiac Studies &  Procedures     STRESS TESTS  MYOCARDIAL PERFUSION IMAGING 12/14/2020  Narrative  The left ventricular ejection fraction is mildly decreased (45-54%).  Nuclear stress EF: 40%.  There was no ST segment deviation noted during stress.  Defect 1: There is a medium defect of moderate severity present in the apex location.  This is an intermediate risk study.  No evidence of ischemia. There is a fixed apical defect, with normal wall motion in this area, suggestive of artifact. Overall low cardiac counts with increased extracardiac activity. Mildly reduced EF without focal wall motion abnormalities.   ECHOCARDIOGRAM  ECHOCARDIOGRAM COMPLETE 02/01/2021  Narrative ECHOCARDIOGRAM REPORT    Patient Name:   [name] [name] Date of Exam: 02/01/2021 Medical Rec #:  [id number]       Height:       72.0 in Accession #:    [id number]      Weight:       220.2 lb Date of Birth:  Oct 25, 1945       BSA:          2.219 m Patient Age:    15 years        BP:           132/84 mmHg Patient Gender: M               HR:           88 bpm. Exam Location:  [address]  Procedure: 2D Echo, Cardiac Doppler, Color Doppler and Intracardiac Opacification Agent  Indications:    I50.9 congestive heart failure  History:        Patient has prior history of Echocardiogram examinations, most recent 05/28/2020. COPD; Risk Factors:Hypertension, Diabetes and Dyslipidemia. Chronic kidney disease. Pneumonia.  Sonographer:    Wilford Sports  [place] Referring Phys: [id number] [facility] A Valera Vallas  IMPRESSIONS   1. Left ventricular ejection fraction, by estimation, is 50 to 55%. The left ventricle has low normal function. The left ventricle has no regional wall motion abnormalities. There is moderate asymmetric left ventricular hypertrophy of the basal-septal segment. Left ventricular diastolic parameters were normal. 2. Right ventricular systolic function is normal. The right ventricular size is normal. Tricuspid  regurgitation signal is inadequate for assessing PA pressure. 3. The mitral valve is normal in structure. Trivial mitral valve regurgitation. No evidence of mitral stenosis. 4. The aortic valve is tricuspid. Aortic valve regurgitation is not visualized. Mild aortic valve sclerosis is present, with no evidence of aortic valve stenosis. 5. Aortic dilatation noted. There is mild dilatation of the ascending aorta, measuring 38 mm.  FINDINGS Left Ventricle: Left ventricular ejection fraction, by estimation, is 50 to 55%. The left ventricle has low normal function. The left ventricle has no regional wall motion abnormalities. Definity contrast agent was given IV to delineate the left ventricular endocardial borders. The left ventricular internal cavity size was normal in size. There is moderate asymmetric left ventricular hypertrophy of the basal-septal segment. Left ventricular diastolic parameters were normal.  Right Ventricle: The right ventricular size is normal. No increase in right ventricular wall thickness. Right ventricular systolic function is normal. Tricuspid regurgitation signal is inadequate for assessing PA pressure.  Left Atrium: Left atrial size was normal in size.  Right Atrium: Right atrial size was normal in size.  Pericardium: There is no evidence of pericardial effusion.  Mitral Valve: The mitral valve is normal in structure. Trivial mitral valve regurgitation. No evidence of mitral valve stenosis.  Tricuspid Valve: The tricuspid valve is normal in structure. Tricuspid valve regurgitation is not demonstrated.  Aortic Valve: The aortic valve is tricuspid. Aortic valve regurgitation is not visualized. Mild aortic valve sclerosis is present, with no evidence of aortic valve stenosis.  Pulmonic Valve: The pulmonic valve was grossly normal. Pulmonic valve regurgitation is trivial.  Aorta: The aortic root is normal in size and structure and aortic dilatation noted. There is mild  dilatation of the ascending aorta, measuring 38 mm.  IAS/Shunts: The interatrial septum was not well visualized.   LEFT VENTRICLE PLAX 2D LVIDd:         5.10 cm  Diastology LVIDs:         3.70 cm  LV e' medial:    6.09 cm/s LV PW:         1.20 cm  LV E/e' medial:  13.1 LV IVS:        1.20 cm  LV e' lateral:   11.10 cm/s LVOT diam:     2.30 cm  LV E/e' lateral: 7.2 LV SV:         78 LV SV Index:   35 LVOT Area:     4.15 cm   RIGHT VENTRICLE             IVC RV Basal diam:  3.70 cm     IVC diam: 1.90 cm RV S prime:     13.40 cm/s TAPSE (M-mode): 3.1 cm  LEFT ATRIUM             Index       RIGHT ATRIUM           Index LA diam:        4.60 cm 2.07 cm/m  RA Area:     13.00 cm LA Vol (A2C):   61.7  ml 27.80 ml/m RA Volume:   29.60 ml  13.34 ml/m LA Vol (A4C):   66.0 ml 29.74 ml/m LA Biplane Vol: 65.6 ml 29.56 ml/m AORTIC VALVE LVOT Vmax:   79.90 cm/s LVOT Vmean:  53.200 cm/s LVOT VTI:    0.188 m  AORTA Ao Root diam: 3.50 cm Ao Asc diam:  3.80 cm  MITRAL VALVE MV Area (PHT): 3.65 cm    SHUNTS MV Decel Time: 208 msec    Systemic VTI:  0.19 m MV E velocity: 79.70 cm/s  Systemic Diam: 2.30 cm MV A velocity: 85.40 cm/s MV E/A ratio:  0.93  Oswaldo Milian MD Electronically signed by Oswaldo Milian MD Signature Date/Time: 02/01/2021/11:33:54 AM    Final     CT SCANS  CT CORONARY MORPH W/CTA COR W/SCORE 03/01/2019  Addendum 03/01/2019  7:16 PM ADDENDUM REPORT: 03/01/2019 19:14  CLINICAL DATA:  89M with sick sinus syndrome, prior tobacco abuse, COPD, hypertension, diabetes, CKD and chest pain.  EXAM: Cardiac/Coronary  CT  TECHNIQUE: The patient was scanned on a [employer].  FINDINGS: A 120 kV prospective scan was triggered in the descending thoracic aorta at 111 HU's. Axial non-contrast 3 mm slices were carried out through the heart. The data set was analyzed on a dedicated work station and scored using the [place]. Gantry  rotation speed was 250 msecs and collimation was .6 mm. No beta blockade and 0.8 mg of sl NTG was given. The 3D data set was reconstructed in 5% intervals of the 67-82 % of the R-R cycle. Diastolic phases were analyzed on a dedicated work station using MPR, MIP and VRT modes. The patient received 80 cc of contrast.  Aorta: Normal size. Ascending aorta 3.3 cm. Mild calcification of the descending aorta. No dissection.  Aortic Valve:  Trileaflet.  No calcifications.  Coronary Arteries:  Normal coronary origin.  Right dominance.  RCA is a small, non-dominant artery.  There is no plaque.  Left main is a large artery that gives rise to LAD and LCX arteries. Minimal (<25%) calcified plaque.  LAD is a large vessel. There is minimal (<25%) mixed plaque in the ostium and mild (25-49%) mixed plaque proximally. There is a short segment of myocardial bridging distally. There is a large, branching D1 with minimal calcified plaque proximally and mild calcified plaque at the bifurcation. D2 and D3 are small and without significant stenosis.  LCX is a large, dominant artery that gives rise to one large OM1 branch. There is scattered minimal calcified plaque. There is a small OM1, medium OM2, large OM3 and small OM4 without evidence of significant disease.  Other findings:  Normal pulmonary vein drainage into the left atrium.  Normal let atrial appendage without a thrombus.  Normal size of the pulmonary artery.  Dual chamber pacemaker noted in the right atrium and right ventricle.  IMPRESSION: 1. Coronary calcium score of 117. This was 36th percentile for age and sex matched control.  2. Normal coronary origin with left dominance.  3. No non-obstructive CAD.  Skeet Latch, MD   Electronically Signed By: Skeet Latch On: 03/01/2019 19:14  Narrative EXAM: OVER-READ INTERPRETATION  CT CHEST  The following report is an over-read performed by radiologist Dr. Rolm Baptise of [facility], [place] on 03/01/2019. This over-read does not include interpretation of cardiac or coronary anatomy or pathology. The coronary CTA interpretation by the cardiologist is attached.  COMPARISON:  None.  FINDINGS: Vascular: Heart is normal size.  Visualized aorta normal caliber.  Mediastinum/Nodes: No adenopathy in the lower mediastinum or hila.  Lungs/Pleura: No confluent opacities or effusions.  Upper Abdomen: Imaging into the upper abdomen shows no acute findings.  Musculoskeletal: Chest wall soft tissues are unremarkable. No acute bony abnormality.  IMPRESSION: No acute or significant extracardiac abnormality.  Electronically Signed: By: Rolm Baptise M.D. On: 03/01/2019 15:01           Recent Labs: 12/16/2021: B Natriuretic Peptide 65.0; Hemoglobin 14.7; Platelets 168 01/08/2022: ALT 26 03/07/2022: Magnesium 2.2 04/23/2022: BUN 12; Creatinine, Ser 1.38; NT-Pro BNP 146; Potassium 4.5; Sodium 142  Recent Lipid Panel    Component Value Date/Time   CHOL 153 01/08/2022 0852   TRIG 103 01/08/2022 0852   HDL 50 01/08/2022 0852   CHOLHDL 3.1 01/08/2022 0852   LDLCALC 84 01/08/2022 0852   Physical Exam:    VS:  BP 112/60   Pulse 64   Ht 6' (1.829 m)   Wt 225 lb 9.6 oz (102.3 kg)   SpO2 96%   BMI 30.60 kg/m     Wt Readings from Last 3 Encounters:  07/16/22 225 lb 9.6 oz (102.3 kg)  06/09/22 220 lb (99.8 kg)  05/16/22 218 lb 0.8 oz (98.9 kg)    Gen: No distress   Neck: No JVD  Cardiac: No Rubs or Gallops, No murmur, RRR +2 radial pulses Respiratory: Clear to auscultation bilaterally, normal effort, normal respiratory rate GI: Soft, nontender, non-distended  MS: No  edema;  moves all extremities Integument: Skin feels warm Neuro:  At time of evaluation, alert and oriented to person/place/time/situation  Psych: Normal affect, patient feels OK   ASSESSMENT:    1. Coronary artery disease of native artery of native heart with stable  angina pectoris ([place])   2. Aortic atherosclerosis ([place])   3. Peripheral arterial disease ([place])   4. Diabetes mellitus with coincident hypertension ([place])   5. Atrial tachycardia   6. PVC's (premature ventricular contractions)     PLAN:    Coronary Artery Disease; Nonobstructive HLD with DM Aortic Atherosclerosis CKD - potentially symptomatic  - anatomy: minimal non obstructive disease in 01/2019 - continue ASA 81 mg; - goal LDL < 70 ; on Zetia 10 and  atorvastatin 80 mg LDL  - Imdur to 60 mg daily has PRN nitro -- continue diltiazem 120 and BB; per EP; this could be consolidated in the future - PET MPI testing given CKD; if positive will need LHC and temporary stop of 1C agent as he as limited AAD options  Heart Failure Preserved Ejection Fraction  HTN with DM - NYHA class II, Stage B, euvolemic  - Diuretic regimen: lasix 20 mg PO Daily   - SGLT2i  - K goal of 4  COPD; OSA on BIPAP - SOB - seeing PCCM - Patient declined cardiopulmonary rehab  PVCs prior VT Hx of atrial tachycardia MDT PPM per Dr. Caryl Comes - seeing EP - continue BB and Flecainide as per EP  APP f/u post stress test     Medication Adjustments/Labs and Tests Ordered: Current medicines are reviewed at length with the patient today.  Concerns regarding medicines are outlined above.  Orders Placed This Encounter  Procedures   NM PET CT CARDIAC PERFUSION MULTI W/ABSOLUTE BLOODFLOW    Meds ordered this encounter  Medications   isosorbide mononitrate (IMDUR) 60 MG 24 hr tablet    Sig: Take 1 tablet (60 mg total) by mouth daily.    Dispense:  90 tablet    Refill:  3     Patient Instructions  Medication Instructions:  Your physician has recommended you make the following change in your medication:  INCREASE: isosorbide mononitrate (Imdur) to 60 mg by mouth once daily  *If you need a refill on your cardiac medications before your next appointment, please call your pharmacy*   Lab Work: NONE If  you have labs (blood work) drawn today and your tests are completely normal, you will receive your results only by: [place] (if you have MyChart) OR A paper copy in the mail If you have any lab test that is abnormal or we need to change your treatment, we will call you to review the results.   Testing/Procedures: Your physician has requested that you have a Cardiac PET Scan.    Follow-Up: At [facility], you and your health needs are our priority.  As part of our continuing mission to provide you with exceptional heart care, we have created designated Provider Care Teams.  These Care Teams include your primary Cardiologist (physician) and Advanced Practice Providers (APPs -  Physician Assistants and Nurse Practitioners) who all work together to provide you with the care you need, when you need it.  We recommend signing up for the patient portal called "MyChart".  Sign up information is provided on this After Visit Summary.  MyChart is used to connect with patients for Virtual Visits (Telemedicine).  Patients are able to view lab/test results, encounter notes, upcoming appointments, etc.  Non-urgent messages can be sent to your provider as well.   To learn more about what you can do with MyChart, go to [email or web address].    Your next appointment:   3 month(s)  Provider:   Nicholes Rough, PA-C, Melina Copa, PA-C, Ambrose Pancoast, NP, or Ermalinda Barrios, PA-C        Other Instructions How to Prepare for Your Cardiac PET/CT Stress Test:  1. Please do not take these medications before your test:   Medications that may interfere with the cardiac pharmacological stress agent (ex. nitrates - including erectile dysfunction medications, isosorbide mononitrate, tamulosin or beta-blockers) the day of the exam. (Erectile dysfunction medication should be held for at least 72 hrs prior to test) Theophylline containing medications for 12 hours. Dipyridamole 48 hours prior to the  test. Your remaining medications may be taken with water.  2. Nothing to eat or drink, except water, 3 hours prior to arrival time.   NO caffeine/decaffeinated products, or chocolate 12 hours prior to arrival.  3. NO perfume, cologne or lotion  4. Total time is 1 to 2 hours; you may want to bring reading material for the waiting time.  5. Please report to Radiology at the [facility] Main Entrance 30 minutes early for your test.  [place], [place] [address]  Diabetic Preparation:  Hold oral medications. You may take NPH and Lantus insulin. Do not take Humalog or Humulin R (Regular Insulin) the day of your test. Check blood sugars prior to leaving the house. If able to eat breakfast prior to 3 hour fasting, you may take all medications, including your insulin, Do not worry if you miss your breakfast dose of insulin - start at your next meal.  IF YOU THINK YOU MAY BE PREGNANT, OR ARE NURSING PLEASE INFORM THE TECHNOLOGIST.  In preparation for your appointment, medication and supplies will be purchased.  Appointment availability is limited, so if you need to cancel or reschedule, please call the Radiology Department at [phone]  24 hours in advance to avoid a cancellation fee of $100.00  What to Expect After you Arrive:  Once you arrive and check in for your appointment, you will be taken to a preparation room within the Radiology Department.  A technologist or Nurse will obtain your medical history, verify that you are correctly prepped for the exam, and explain the procedure.  Afterwards,  an IV will be started in your arm and electrodes will be placed on your skin for EKG monitoring during the stress portion of the exam. Then you will be escorted to the PET/CT scanner.  There, staff will get you positioned on the scanner and obtain a blood pressure and EKG.  During the exam, you will continue to be connected to the EKG and blood pressure machines.  A small,  safe amount of a radioactive tracer will be injected in your IV to obtain a series of pictures of your heart along with an injection of a stress agent.    After your Exam:  It is recommended that you eat a meal and drink a caffeinated beverage to counter act any effects of the stress agent.  Drink plenty of fluids for the remainder of the day and urinate frequently for the first couple of hours after the exam.  Your doctor will inform you of your test results within 7-10 business days.  For questions about your test or how to prepare for your test, please call: Marchia Bond, Cardiac Imaging Nurse Navigator  Gordy Clement, Cardiac Imaging Nurse Navigator Office: [phone]     Signed, Werner Lean, MD  07/16/2022 8:35 AM    [place]

## 2022-07-16 ENCOUNTER — Ambulatory Visit: Payer: 59 | Attending: Internal Medicine | Admitting: Internal Medicine

## 2022-07-16 ENCOUNTER — Encounter: Payer: Self-pay | Admitting: Internal Medicine

## 2022-07-16 VITALS — BP 112/60 | HR 64 | Ht 72.0 in | Wt 225.6 lb

## 2022-07-16 DIAGNOSIS — I4719 Other supraventricular tachycardia: Secondary | ICD-10-CM | POA: Diagnosis not present

## 2022-07-16 DIAGNOSIS — I700 Atherosclerosis of aorta: Secondary | ICD-10-CM | POA: Diagnosis not present

## 2022-07-16 DIAGNOSIS — I10 Essential (primary) hypertension: Secondary | ICD-10-CM | POA: Diagnosis not present

## 2022-07-16 DIAGNOSIS — E119 Type 2 diabetes mellitus without complications: Secondary | ICD-10-CM

## 2022-07-16 DIAGNOSIS — I739 Peripheral vascular disease, unspecified: Secondary | ICD-10-CM | POA: Diagnosis not present

## 2022-07-16 DIAGNOSIS — I25118 Atherosclerotic heart disease of native coronary artery with other forms of angina pectoris: Secondary | ICD-10-CM | POA: Diagnosis not present

## 2022-07-16 DIAGNOSIS — I493 Ventricular premature depolarization: Secondary | ICD-10-CM

## 2022-07-16 MED ORDER — ISOSORBIDE MONONITRATE ER 60 MG PO TB24: 60.0000 mg | ORAL_TABLET | Freq: Every day | ORAL | 3 refills | Status: DC

## 2022-07-16 NOTE — Patient Instructions (Signed)
Medication Instructions:  Your physician has recommended you make the following change in your medication:  INCREASE: isosorbide mononitrate (Imdur) to 60 mg by mouth once daily  *If you need a refill on your cardiac medications before your next appointment, please call your pharmacy*   Lab Work: NONE If you have labs (blood work) drawn today and your tests are completely normal, you will receive your results only by: [place] (if you have MyChart) OR A paper copy in the mail If you have any lab test that is abnormal or we need to change your treatment, we will call you to review the results.   Testing/Procedures: Your physician has requested that you have a Cardiac PET Scan.    Follow-Up: At [facility], you and your health needs are our priority.  As part of our continuing mission to provide you with exceptional heart care, we have created designated Provider Care Teams.  These Care Teams include your primary Cardiologist (physician) and Advanced Practice Providers (APPs -  Physician Assistants and Nurse Practitioners) who all work together to provide you with the care you need, when you need it.  We recommend signing up for the patient portal called "MyChart".  Sign up information is provided on this After Visit Summary.  MyChart is used to connect with patients for Virtual Visits (Telemedicine).  Patients are able to view lab/test results, encounter notes, upcoming appointments, etc.  Non-urgent messages can be sent to your provider as well.   To learn more about what you can do with MyChart, go to [email or web address].    Your next appointment:   3 month(s)  Provider:   Nicholes Rough, PA-C, Melina Copa, PA-C, Ambrose Pancoast, NP, or Ermalinda Barrios, PA-C        Other Instructions How to Prepare for Your Cardiac PET/CT Stress Test:  1. Please do not take these medications before your test:   Medications that may interfere with the cardiac pharmacological stress  agent (ex. nitrates - including erectile dysfunction medications, isosorbide mononitrate, tamulosin or beta-blockers) the day of the exam. (Erectile dysfunction medication should be held for at least 72 hrs prior to test) Theophylline containing medications for 12 hours. Dipyridamole 48 hours prior to the test. Your remaining medications may be taken with water.  2. Nothing to eat or drink, except water, 3 hours prior to arrival time.   NO caffeine/decaffeinated products, or chocolate 12 hours prior to arrival.  3. NO perfume, cologne or lotion  4. Total time is 1 to 2 hours; you may want to bring reading material for the waiting time.  5. Please report to Radiology at the [facility] Main Entrance 30 minutes early for your test.  [place], [place] [address]  Diabetic Preparation:  Hold oral medications. You may take NPH and Lantus insulin. Do not take Humalog or Humulin R (Regular Insulin) the day of your test. Check blood sugars prior to leaving the house. If able to eat breakfast prior to 3 hour fasting, you may take all medications, including your insulin, Do not worry if you miss your breakfast dose of insulin - start at your next meal.  IF YOU THINK YOU MAY BE PREGNANT, OR ARE NURSING PLEASE INFORM THE TECHNOLOGIST.  In preparation for your appointment, medication and supplies will be purchased.  Appointment availability is limited, so if you need to cancel or reschedule, please call the Radiology Department at [phone]  24 hours in advance to avoid a cancellation fee  of $100.00  What to Expect After you Arrive:  Once you arrive and check in for your appointment, you will be taken to a preparation room within the Radiology Department.  A technologist or Nurse will obtain your medical history, verify that you are correctly prepped for the exam, and explain the procedure.  Afterwards,  an IV will be started in your arm and electrodes will be placed on  your skin for EKG monitoring during the stress portion of the exam. Then you will be escorted to the PET/CT scanner.  There, staff will get you positioned on the scanner and obtain a blood pressure and EKG.  During the exam, you will continue to be connected to the EKG and blood pressure machines.  A small, safe amount of a radioactive tracer will be injected in your IV to obtain a series of pictures of your heart along with an injection of a stress agent.    After your Exam:  It is recommended that you eat a meal and drink a caffeinated beverage to counter act any effects of the stress agent.  Drink plenty of fluids for the remainder of the day and urinate frequently for the first couple of hours after the exam.  Your doctor will inform you of your test results within 7-10 business days.  For questions about your test or how to prepare for your test, please call: Marchia Bond, Cardiac Imaging Nurse Navigator  Gordy Clement, Cardiac Imaging Nurse Navigator Office: [phone]

## 2022-08-18 ENCOUNTER — Telehealth: Payer: Self-pay | Admitting: Internal Medicine

## 2022-08-18 ENCOUNTER — Telehealth: Payer: Self-pay | Admitting: Adult Health

## 2022-08-18 NOTE — Telephone Encounter (Signed)
Spoke with daughter. :  Complains of 1 week of increased shortness of breath and cough . O2 sats drop into upper 80s. Worse with mowing this grass and blowing leaves.  On daily prednisone 10mg  daily  Advised needs ov for further evaluation.  Increase prednisone 30mg  daily for next 2 days until seen in office  Please call daughter to make ov tomorrow , can double book my schedule at 10, 11 or 1:30. If Dr. Maple Hudson  can not see.  Please contact office for sooner follow up if symptoms do not improve or worsen or seek emergency care

## 2022-08-18 NOTE — Telephone Encounter (Signed)
Called pt daughter ok per DPR.  Reports received a letter in the mail that it's time to schedule f/u visit.  Pt has a f/u visit with Tessa and Dr. Graciela Husbands scheduled.  Daughter concerned pt has not been called for PET scan appointment.  Advised daughter PET Scan appointments are a few months out.  If pt not scheduled by June OV with Tessa to call  back to reschedule. Daughter verbalizes understanding no further questions/concerns at this time.

## 2022-08-18 NOTE — Telephone Encounter (Signed)
[name] would like a callback [phone]) regarding when pt f/u appt should be since he hasn't been called yet to have his "NM PET CT CARDIAC PERFUSION MULTI W/ABSOLUTE BLOODFLOW" scheduled yet. Please advise.

## 2022-08-19 ENCOUNTER — Encounter: Payer: Self-pay | Admitting: Adult Health

## 2022-08-19 ENCOUNTER — Ambulatory Visit (INDEPENDENT_AMBULATORY_CARE_PROVIDER_SITE_OTHER): Payer: 59

## 2022-08-19 ENCOUNTER — Telehealth: Payer: Self-pay | Admitting: Internal Medicine

## 2022-08-19 ENCOUNTER — Ambulatory Visit (INDEPENDENT_AMBULATORY_CARE_PROVIDER_SITE_OTHER): Payer: 59 | Admitting: Adult Health

## 2022-08-19 VITALS — BP 106/70 | HR 91 | Temp 98.1°F | Ht 72.0 in | Wt 229.4 lb

## 2022-08-19 DIAGNOSIS — J449 Chronic obstructive pulmonary disease, unspecified: Secondary | ICD-10-CM | POA: Diagnosis not present

## 2022-08-19 DIAGNOSIS — J4489 Other specified chronic obstructive pulmonary disease: Secondary | ICD-10-CM | POA: Diagnosis not present

## 2022-08-19 DIAGNOSIS — J9611 Chronic respiratory failure with hypoxia: Secondary | ICD-10-CM

## 2022-08-19 DIAGNOSIS — G4733 Obstructive sleep apnea (adult) (pediatric): Secondary | ICD-10-CM | POA: Diagnosis not present

## 2022-08-19 MED ORDER — PREDNISONE 10 MG PO TABS: ORAL_TABLET | ORAL | 0 refills | Status: DC

## 2022-08-19 MED ORDER — DOXYCYCLINE HYCLATE 100 MG PO TABS: 100.0000 mg | ORAL_TABLET | Freq: Two times a day (BID) | ORAL | 0 refills | Status: DC

## 2022-08-19 MED ORDER — ALBUTEROL SULFATE (2.5 MG/3ML) 0.083% IN NEBU: 2.5000 mg | INHALATION_SOLUTION | Freq: Once | RESPIRATORY_TRACT | Status: AC

## 2022-08-19 NOTE — Telephone Encounter (Signed)
Pt has an appt scheduled today 4/16 at 11am with Tammy.

## 2022-08-19 NOTE — Progress Notes (Unsigned)
@Patient  ID: [name] [name], male    DOB: [birth year], 77 y.o.   MRN: [id number]  Chief Complaint  Patient presents with   Acute Visit    Referring provider: [name], MD  HPI: 77 year old male former smoker followed for COPD and obstructive sleep apnea Medical history significant for coronary artery disease, congestive heart failure, cardiac arrhythmia status post pacemaker/ICD   TEST/EVENTS :  Office Spirometry 08/11/14-moderate obstruction/restriction-FVC 2.95/62%, FEV1 2.37/65%, ratio 0.79, FEF 25-75% 2.43/77% PFT 10/26/2017-normal flows without response to bronchodilator, moderate diffusion defect. Walk Test O2 Qualifying-04/26/2018-qualified for portable oxygen-during his second lap heart rate reached 112 and saturation fell to 86% on room air Labs 04/26/2018- IgE 464, EOS wnl HST 05/17/18>> Severe obstructive sleep apnea AHI:33 CPAP to BIPAP titration 02/18/2019-  BIPAP 10/6, if not tolerated try CPAP 7   08/19/2022 Acute OV :  COPD, OSA  Patient presents for an acute office visit.  Patient complains of 1 week of cough, congestion, shortness of breath and wheezing.  Cough is minimally productive.  Patient says over the last year feels that his breathing does get bad at times.  Gets short of breath with activities.  Patient was previously on oxygen but turned it in over couple years ago.  He remains on BiPAP at bedtime.  He is also taking Trelegy inhaler and Singulair daily.  Symptoms seem to have gotten worse after he was outside mowing and the dust and pollen recently.  Appetite is good.  No hemoptysis chest pain orthopnea.  Does complain of nasal congestion and drainage. Remains on BiPAP for underlying sleep apnea.  Patient says he tries to wear his BiPAP each night.  Feels that he benefits from BiPAP.  Says he does take it off in the middle night.  We discussed trying to wear his BiPAP all night long.  BiPAP download shows 93% compliance daily average usage at 4 hours.  Patient  is on IPAP 10 cm H2O.  EPAP 6 cm H2O.  AHI 4.4/hour. Walk test in the office shows no significant desaturations on room air with O2 saturations remaining above 90% walking  Allergies  Allergen Reactions   Penicillins Anaphylaxis    Has patient had a PCN reaction causing immediate rash, facial/tongue/throat swelling, SOB or lightheadedness with hypotension: Yes Has patient had a PCN reaction causing severe rash involving mucus membranes or skin necrosis: No Has patient had a PCN reaction that required hospitalization: Yes Has patient had a PCN reaction occurring within the last 10 years: No If all of the above answers are "NO", then may proceed with Cephalosporin use.    Codeine Rash    Immunization History  Administered Date(s) Administered   Fluad Quad(high Dose 65+) 01/27/2019, 03/12/2020   Influenza Split 01/17/2008, 02/09/2009, 01/21/2010, 02/02/2014, 02/10/2017, 02/24/2017   Influenza, High Dose Seasonal PF 02/07/2011, 02/05/2012, 01/04/2014, 05/28/2015, 02/01/2016, 02/04/2017, 01/17/2018   Influenza,inj,Quad PF,6+ Mos 01/27/2013, 05/07/2015   Moderna Sars-Covid-2 Vaccination 05/28/2019, 06/28/2019, 02/27/2020   Pneumococcal Conjugate-13 08/25/2014   Pneumococcal Polysaccharide-23 09/09/2011, 09/25/2017   Pneumococcal-Unspecified 02/02/2014   Td 08/05/2006   Tdap 08/05/2006    Past Medical History:  Diagnosis Date   Arthritis    Bradycardia    pacemaker - Medtronic Adapta #[id number] - December 22, 2005   Carpal tunnel syndrome on both sides    Carpal tunnel syndrome, bilateral    Chronic back pain    Chronic kidney disease    COPD (chronic obstructive pulmonary disease) (HCC)    Depression    Diabetes  mellitus    type II controled with diet   GERD (gastroesophageal reflux disease)    Headache(784.0)    Hyperlipidemia    Hypertension    Hypertensive retinopathy    Insomnia    Neuromuscular disorder (HCC)    peripheral neuropathy BLE   Pneumonia    Presence of  permanent cardiac pacemaker    Wears glasses     Tobacco History: Social History   Tobacco Use  Smoking Status Former   Packs/day: 2.00   Years: 15.00   Additional pack years: 0.00   Total pack years: 30.00   Types: Cigarettes   Start date: 05/05/1958   Quit date: 05/05/1973   Years since quitting: 49.3  Smokeless Tobacco Never   Counseling given: Not Answered   Outpatient Medications Prior to Visit  Medication Sig Dispense Refill   acetaminophen (TYLENOL) 650 MG CR tablet Take 1,300 mg daily as needed by mouth for pain.      aspirin 81 MG tablet Take 81 mg by mouth daily.     atorvastatin (LIPITOR) 80 MG tablet TAKE 1 TABLET BY MOUTH ONCE  DAILY 90 tablet 2   CALCIUM-MAGNESIUM-ZINC PO Take 1 capsule by mouth daily.     diltiazem (CARDIZEM SR) 120 MG 12 hr capsule Take 1 capsule (120 mg total) by mouth daily. 90 capsule 3   docusate sodium ([name]) 100 MG capsule Take 100 mg by mouth 2 (two) times daily.     empagliflozin (JARDIANCE) 10 MG TABS tablet Take 1 tablet (10 mg total) by mouth daily before breakfast. 90 tablet 3   ezetimibe (ZETIA) 10 MG tablet Take 1 tablet by mouth once daily 90 tablet 3   famotidine (PEPCID) 20 MG tablet TAKE ONE TABLET BY MOUTH ONCE DAILY AT BEDTIME 30 tablet 0   flecainide (TAMBOCOR) 100 MG tablet Take 1 tablet (100 mg total) by mouth 2 (two) times daily. 180 tablet 3   furosemide (LASIX) 20 MG tablet Take 1 tablet (20 mg total) by mouth daily. 90 tablet 3   gabapentin (NEURONTIN) 300 MG capsule Take 300 mg by mouth 2 (two) times daily.     glimepiride (AMARYL) 1 MG tablet Take 1 tablet by mouth daily.     guaiFENesin (MUCINEX) 600 MG 12 hr tablet Take 1 tablet (600 mg total) by mouth 2 (two) times daily. 30 tablet 3   ipratropium-albuterol (DUONEB) 0.5-2.5 (3) MG/3ML SOLN Inhale 3 mLs into the lungs every 6 (six) hours as needed (wheezing or shortness of breath). 360 mL 12   isosorbide mononitrate (IMDUR) 60 MG 24 hr tablet Take 1 tablet (60 mg  total) by mouth daily. 90 tablet 3   Lancets (ONETOUCH DELICA PLUS LANCET33G) MISC daily at 6 (six) AM.     levocetirizine (XYZAL) 5 MG tablet Take 5 mg at bedtime by mouth.      metoprolol tartrate (LOPRESSOR) 50 MG tablet Take 1 tablet (50 mg total) by mouth 2 (two) times daily. 180 tablet 3   montelukast (SINGULAIR) 10 MG tablet Take 10 mg at bedtime by mouth.      Multiple Vitamin (MULTIVITAMIN WITH MINERALS) TABS tablet Take 1 tablet by mouth daily.     nitroGLYCERIN (NITROSTAT) 0.4 MG SL tablet DISSOLVE ONE TABLET UNDER THE TONGUE EVERY 5 MINUTES AS NEEDED FOR CHEST PAIN.  DO NOT EXCEED A TOTAL OF 3 DOSES IN 15 MINUTES 25 tablet 5   olmesartan (BENICAR) 20 MG tablet Take 10 mg by mouth daily.     ONETOUCH  VERIO test strip 1 each by Other route daily.     pantoprazole (PROTONIX) 40 MG tablet TAKE ONE TABLET BY MOUTH ONCE DAILY TAKE  30-60  MINUTES  BEFORE  FIRST  MEAL  OF  THE  DAY 30 tablet 0   PROAIR HFA 108 (90 BASE) MCG/ACT inhaler Inhale 2 puffs into the lungs 4 (four) times daily as needed for wheezing or shortness of breath.     Respiratory Therapy Supplies (FLUTTER) DEVI Blow through 4 times per set, 3 sets per day when needed to clear lungs 1 each 0   TRELEGY ELLIPTA 100-62.5-25 MCG/ACT AEPB USE 1 INHALATION BY MOUTH DAILY 180 each 3   predniSONE (DELTASONE) 10 MG tablet TAKE 1 TABLET BY MOUTH EVERY OTHER DAY (Patient not taking: Reported on 08/19/2022) 60 tablet 3   No facility-administered medications prior to visit.     Review of Systems:   Constitutional:   No  weight loss, night sweats,  Fevers, chills, fatigue, or  lassitude.  HEENT:   No headaches,  Difficulty swallowing,  Tooth/dental problems, or  Sore throat,                No sneezing, itching, ear ache,  +nasal congestion, post nasal drip,   CV:  No chest pain,  Orthopnea, PND, swelling in lower extremities, anasarca, dizziness, palpitations, syncope.   GI  No heartburn, indigestion, abdominal pain, nausea,  vomiting, diarrhea, change in bowel habits, loss of appetite, bloody stools.   Resp:  No chest wall deformity  Skin: no rash or lesions.  GU: no dysuria, change in color of urine, no urgency or frequency.  No flank pain, no hematuria   MS:  No joint pain or swelling.  No decreased range of motion.  No back pain.    Physical Exam  GEN: A/Ox3; pleasant , NAD, elderly    HEENT:  [place]/AT,  EACs-clear, TMs-wnl, NOSE-clear, THROAT-clear, no lesions, no postnasal drip or exudate noted.   NECK:  Supple w/ fair ROM; no JVD; normal carotid impulses w/o bruits; no thyromegaly or nodules palpated; no lymphadenopathy.    RESP  Clear  P & A; w/o, wheezes/ rales/ or rhonchi. no accessory muscle use, no dullness to percussion  CARD:  RRR, no m/r/g, no peripheral edema, pulses intact, no cyanosis or clubbing.  GI:   Soft & nt; nml bowel sounds; no organomegaly or masses detected.   Musco: Warm bil, no deformities or joint swelling noted.   Neuro: alert, no focal deficits noted.    Skin: Warm, no lesions or rashes    Lab Results:  CBC    Component Value Date/Time   WBC 15.2 (H) 12/16/2021 2350   RBC 4.50 12/16/2021 2350   HGB 14.7 12/16/2021 2350   HCT 43.0 12/16/2021 2350   PLT 168 12/16/2021 2350   MCV 95.6 12/16/2021 2350   MCH 32.7 12/16/2021 2350   MCHC 34.2 12/16/2021 2350   RDW 13.3 12/16/2021 2350   LYMPHSABS 1.1 12/16/2021 2350   MONOABS 1.2 (H) 12/16/2021 2350   EOSABS 0.0 12/16/2021 2350   BASOSABS 0.0 12/16/2021 2350    BMET    Component Value Date/Time   NA 142 04/23/2022 0920   K 4.5 04/23/2022 0920   CL 102 04/23/2022 0920   CO2 25 04/23/2022 0920   GLUCOSE 122 (H) 04/23/2022 0920   GLUCOSE 131 (H) 12/16/2021 2350   BUN 12 04/23/2022 0920   CREATININE 1.38 (H) 04/23/2022 0920   CREATININE 1.23 (H) 06/07/2015  1329   CALCIUM 9.1 04/23/2022 0920   GFRNONAA 53 (L) 12/16/2021 2350   GFRAA 70 06/29/2020 1047    BNP    Component Value Date/Time   BNP 65.0  12/16/2021 2350    ProBNP    Component Value Date/Time   PROBNP 146 04/23/2022 0920   PROBNP 61.0 08/28/2014 0935    Imaging: No results found.       Latest Ref Rng & Units 10/26/2017   12:15 PM 08/10/2015    8:35 AM  PFT Results  FVC-Pre L 3.70  3.53   FVC-Predicted Pre % 78  73   FVC-Post L 3.89  3.58   FVC-Predicted Post % 82  74   Pre FEV1/FVC % % 82  84   Post FEV1/FCV % % 83  84   FEV1-Pre L 3.03  2.98   FEV1-Predicted Pre % 88  85   FEV1-Post L 3.21  2.99   DLCO uncorrected ml/min/mmHg 21.37  20.62   DLCO UNC% % 59  57   DLCO corrected ml/min/mmHg  23.93   DLCO COR %Predicted %  67   DLVA Predicted % 77  99   TLC L 6.57  5.61   TLC % Predicted % 87  74   RV % Predicted % 94  84     No results found for: "NITRICOXIDE"      Assessment & Plan:   No problem-specific Assessment & Plan notes found for this encounter.   I spent   41 minutes dedicated to the care of this patient on the date of this encounter to include pre-visit review of records, face-to-face time with the patient discussing conditions above, post visit ordering of testing, clinical documentation with the electronic health record, making appropriate referrals as documented, and communicating necessary findings to members of the patients care team.    Rubye Oaks, NP 08/19/2022

## 2022-08-19 NOTE — Telephone Encounter (Signed)
Spoke with patient's daughter, RobiZella Ball who states patient has been experiencing more chest pain and taking nitro more frequently. She states he told her he took 3 nitro on Friday, 08/15/22 and pain was relieved after 3rd dose. She thinks he does not tell her every time he has chest pain or takes nitro.  Daughter states patient was seen by pulmonology today due to increased SOB, this is likely worsened by pollen and grass from mowing yard. He was started on a prednisone dose pack and doxycycline for COPD. Patient is not consistently using his BiPAP.   Reviewed cardiac medications and encouraged compliance with Imdur and lasix. Provided education on daily weights and monitoring for fluid retention. Reviewed recommendations for using nitro for chest pain and when to go to the ED or all 911. He is currently awaiting a cardiac Pet scan to be scheduled.   I advised his [name] I would make Dr Izora Ribas aware of her concerns and patient's symptoms.

## 2022-08-19 NOTE — Progress Notes (Unsigned)
Received flu vaccine for 2023. 

## 2022-08-19 NOTE — Patient Instructions (Addendum)
Doxycycline  Twice daily  for 1 week  Prednisone taper over next week then resume  daily  Continue on Trelegy 1 puff daily  Mucinex DM Twice daily  As needed  cough /congestion  Continue on BIPAP At bedtime , wear all night long for at least 6hrs or more.  Flutter valve Twice daily   Albuterol or Duoneb As needed   Activity as tolerated.  Continue on Singulair daily  Begin Zyrtec  At bedtime  for next 2 weeks , then As needed  allergies/nasal drainage.  Follow up with Dr. Maple Hudson  in 4-6 weeks and As needed   Please contact office for sooner follow up if symptoms do not improve or worsen or seek emergency care

## 2022-08-19 NOTE — Telephone Encounter (Signed)
Pt states last Friday 4/12 pt had some CP and took three nitros. She states she just found out about it today. She states he didn't have any other symptoms during that time. She states he is not having any cp today but wanted to let Dr. Izora Ribas know.

## 2022-08-21 NOTE — Assessment & Plan Note (Signed)
Continue on nocturnal BiPAP.  Encouraged on increased daily usage.  Plan  Patient Instructions  Doxycycline  Twice daily  for 1 week  Prednisone taper over next week then resume  daily  Continue on Trelegy 1 puff daily  Mucinex DM Twice daily  As needed  cough /congestion  Continue on BIPAP At bedtime , wear all night long for at least 6hrs or more.  Flutter valve Twice daily   Albuterol or Duoneb As needed   Activity as tolerated.  Continue on Singulair daily  Begin Zyrtec  At bedtime  for next 2 weeks , then As needed  allergies/nasal drainage.  Follow up with Dr. Maple Hudson  in 4-6 weeks and As needed   Please contact office for sooner follow up if symptoms do not improve or worsen or seek emergency care

## 2022-08-21 NOTE — Assessment & Plan Note (Signed)
History of oxygen dependent respiratory failure.  Walk test in the office shows no significant desaturations.

## 2022-08-21 NOTE — Assessment & Plan Note (Addendum)
Acute exacerbation of COPD -albuterol nebulizer treatment given in the office today.  Chest x-ray shows bandlike atelectasis in the left base questionable early pneumonia Consider follow-up chest x-ray on return visit  Plan Patient Instructions  Doxycycline  Twice daily  for 1 week  Prednisone taper over next week then resume  daily  Continue on Trelegy 1 puff daily  Mucinex DM Twice daily  As needed  cough /congestion  Continue on BIPAP At bedtime , wear all night long for at least 6hrs or more.  Flutter valve Twice daily   Albuterol or Duoneb As needed   Activity as tolerated.  Continue on Singulair daily  Begin Zyrtec  At bedtime  for next 2 weeks , then As needed  allergies/nasal drainage.  Follow up with Dr. Maple Hudson  in 4-6 weeks and As needed   Please contact office for sooner follow up if symptoms do not improve or worsen or seek emergency care

## 2022-09-04 DIAGNOSIS — E1151 Type 2 diabetes mellitus with diabetic peripheral angiopathy without gangrene: Secondary | ICD-10-CM | POA: Diagnosis not present

## 2022-09-04 DIAGNOSIS — I10 Essential (primary) hypertension: Secondary | ICD-10-CM | POA: Diagnosis not present

## 2022-09-04 DIAGNOSIS — I130 Hypertensive heart and chronic kidney disease with heart failure and stage 1 through stage 4 chronic kidney disease, or unspecified chronic kidney disease: Secondary | ICD-10-CM | POA: Diagnosis not present

## 2022-09-04 DIAGNOSIS — I5032 Chronic diastolic (congestive) heart failure: Secondary | ICD-10-CM | POA: Diagnosis not present

## 2022-09-04 DIAGNOSIS — K219 Gastro-esophageal reflux disease without esophagitis: Secondary | ICD-10-CM | POA: Diagnosis not present

## 2022-09-04 DIAGNOSIS — E1142 Type 2 diabetes mellitus with diabetic polyneuropathy: Secondary | ICD-10-CM | POA: Diagnosis not present

## 2022-09-04 DIAGNOSIS — I25118 Atherosclerotic heart disease of native coronary artery with other forms of angina pectoris: Secondary | ICD-10-CM | POA: Diagnosis not present

## 2022-09-04 DIAGNOSIS — J449 Chronic obstructive pulmonary disease, unspecified: Secondary | ICD-10-CM | POA: Diagnosis not present

## 2022-09-04 DIAGNOSIS — E1165 Type 2 diabetes mellitus with hyperglycemia: Secondary | ICD-10-CM | POA: Diagnosis not present

## 2022-09-04 DIAGNOSIS — N183 Chronic kidney disease, stage 3 unspecified: Secondary | ICD-10-CM | POA: Diagnosis not present

## 2022-09-04 DIAGNOSIS — E7800 Pure hypercholesterolemia, unspecified: Secondary | ICD-10-CM | POA: Diagnosis not present

## 2022-09-04 DIAGNOSIS — Z950 Presence of cardiac pacemaker: Secondary | ICD-10-CM | POA: Diagnosis not present

## 2022-09-04 DIAGNOSIS — E1122 Type 2 diabetes mellitus with diabetic chronic kidney disease: Secondary | ICD-10-CM | POA: Diagnosis not present

## 2022-09-10 ENCOUNTER — Ambulatory Visit: Payer: 59 | Attending: Internal Medicine

## 2022-09-10 DIAGNOSIS — I498 Other specified cardiac arrhythmias: Secondary | ICD-10-CM

## 2022-09-10 LAB — CUP PACEART INCLINIC DEVICE CHECK
Battery Impedance: 803 Ohm
Battery Remaining Longevity: 66 mo
Battery Voltage: 2.77 V
Brady Statistic AP VP Percent: 5 %
Brady Statistic AP VS Percent: 94 %
Brady Statistic AS VP Percent: 1 %
Brady Statistic AS VS Percent: 0 %
Date Time Interrogation Session: 20240508124730
Implantable Lead Connection Status: 753985
Implantable Lead Connection Status: 753985
Implantable Lead Implant Date: 20070820
Implantable Lead Implant Date: 20070820
Implantable Lead Location: 753859
Implantable Lead Location: 753860
Implantable Lead Model: 4092
Implantable Lead Model: 5076
Implantable Pulse Generator Implant Date: 20170222
Lead Channel Impedance Value: 487 Ohm
Lead Channel Impedance Value: 608 Ohm
Lead Channel Pacing Threshold Amplitude: 0.875 V
Lead Channel Pacing Threshold Amplitude: 1 V
Lead Channel Pacing Threshold Amplitude: 1.125 V
Lead Channel Pacing Threshold Amplitude: 1.25 V
Lead Channel Pacing Threshold Pulse Width: 0.4 ms
Lead Channel Pacing Threshold Pulse Width: 0.4 ms
Lead Channel Pacing Threshold Pulse Width: 0.4 ms
Lead Channel Pacing Threshold Pulse Width: 0.46 ms
Lead Channel Sensing Intrinsic Amplitude: 2.8 mV
Lead Channel Setting Pacing Amplitude: 2.25 V
Lead Channel Setting Pacing Amplitude: 2.5 V
Lead Channel Setting Pacing Pulse Width: 0.46 ms
Lead Channel Setting Sensing Sensitivity: 1 mV
Zone Setting Status: 755011
Zone Setting Status: 755011

## 2022-09-10 NOTE — Progress Notes (Signed)
Pacemaker check in clinic. Normal device function. Thresholds, sensing, impedances consistent with previous measurements. Device programmed to maximize longevity. 1399  AMS <1% burden since 06/09/22. Longest episode 1hr68min. EGMS/ rates suggestive  of AF/AT. Longest episode had max atrial rate 159/86bpm. Device programmed at appropriate safety margins. Histogram distribution appropriate for patient activity level. Device programmed to optimize intrinsic conduction. Estimated longevity 5.87yrs. ROV with SK 12/05/22

## 2022-09-29 ENCOUNTER — Emergency Department (HOSPITAL_BASED_OUTPATIENT_CLINIC_OR_DEPARTMENT_OTHER): Payer: 59

## 2022-09-29 ENCOUNTER — Emergency Department (HOSPITAL_BASED_OUTPATIENT_CLINIC_OR_DEPARTMENT_OTHER)
Admission: EM | Admit: 2022-09-29 | Discharge: 2022-09-29 | Disposition: A | Discharge status: Home / Self Care | Payer: 59 | Attending: Emergency Medicine | Admitting: Emergency Medicine

## 2022-09-29 ENCOUNTER — Other Ambulatory Visit: Payer: Self-pay

## 2022-09-29 ENCOUNTER — Encounter (HOSPITAL_BASED_OUTPATIENT_CLINIC_OR_DEPARTMENT_OTHER): Payer: Self-pay | Admitting: Emergency Medicine

## 2022-09-29 DIAGNOSIS — I10 Essential (primary) hypertension: Secondary | ICD-10-CM | POA: Diagnosis not present

## 2022-09-29 DIAGNOSIS — J449 Chronic obstructive pulmonary disease, unspecified: Secondary | ICD-10-CM | POA: Insufficient documentation

## 2022-09-29 DIAGNOSIS — M47812 Spondylosis without myelopathy or radiculopathy, cervical region: Secondary | ICD-10-CM | POA: Insufficient documentation

## 2022-09-29 DIAGNOSIS — M62838 Other muscle spasm: Secondary | ICD-10-CM | POA: Insufficient documentation

## 2022-09-29 DIAGNOSIS — I2510 Atherosclerotic heart disease of native coronary artery without angina pectoris: Secondary | ICD-10-CM | POA: Diagnosis not present

## 2022-09-29 DIAGNOSIS — R0602 Shortness of breath: Secondary | ICD-10-CM | POA: Diagnosis not present

## 2022-09-29 DIAGNOSIS — M436 Torticollis: Secondary | ICD-10-CM | POA: Diagnosis not present

## 2022-09-29 DIAGNOSIS — N189 Chronic kidney disease, unspecified: Secondary | ICD-10-CM | POA: Insufficient documentation

## 2022-09-29 DIAGNOSIS — Z79899 Other long term (current) drug therapy: Secondary | ICD-10-CM | POA: Diagnosis not present

## 2022-09-29 DIAGNOSIS — M542 Cervicalgia: Secondary | ICD-10-CM | POA: Diagnosis not present

## 2022-09-29 DIAGNOSIS — I6522 Occlusion and stenosis of left carotid artery: Secondary | ICD-10-CM | POA: Diagnosis not present

## 2022-09-29 DIAGNOSIS — I129 Hypertensive chronic kidney disease with stage 1 through stage 4 chronic kidney disease, or unspecified chronic kidney disease: Secondary | ICD-10-CM | POA: Insufficient documentation

## 2022-09-29 DIAGNOSIS — E119 Type 2 diabetes mellitus without complications: Secondary | ICD-10-CM | POA: Diagnosis not present

## 2022-09-29 DIAGNOSIS — D72829 Elevated white blood cell count, unspecified: Secondary | ICD-10-CM | POA: Diagnosis not present

## 2022-09-29 DIAGNOSIS — M47892 Other spondylosis, cervical region: Secondary | ICD-10-CM | POA: Diagnosis not present

## 2022-09-29 DIAGNOSIS — Z7984 Long term (current) use of oral hypoglycemic drugs: Secondary | ICD-10-CM | POA: Insufficient documentation

## 2022-09-29 DIAGNOSIS — Z950 Presence of cardiac pacemaker: Secondary | ICD-10-CM | POA: Diagnosis not present

## 2022-09-29 DIAGNOSIS — Z7982 Long term (current) use of aspirin: Secondary | ICD-10-CM | POA: Diagnosis not present

## 2022-09-29 LAB — COMPREHENSIVE METABOLIC PANEL
ALT: 13 U/L (ref 0–44)
AST: 15 U/L (ref 15–41)
Albumin: 3.7 g/dL (ref 3.5–5.0)
Alkaline Phosphatase: 46 U/L (ref 38–126)
Anion gap: 10 (ref 5–15)
BUN: 27 mg/dL — ABNORMAL HIGH (ref 8–23)
CO2: 25 mmol/L (ref 22–32)
Calcium: 8.3 mg/dL — ABNORMAL LOW (ref 8.9–10.3)
Chloride: 99 mmol/L (ref 98–111)
Creatinine, Ser: 1.23 mg/dL (ref 0.61–1.24)
GFR, Estimated: 60 mL/min — ABNORMAL LOW (ref >60)
Glucose, Bld: 104 mg/dL — ABNORMAL HIGH (ref 70–99)
Potassium: 3.4 mmol/L — ABNORMAL LOW (ref 3.5–5.1)
Sodium: 134 mmol/L — ABNORMAL LOW (ref 135–145)
Total Bilirubin: 0.8 mg/dL (ref 0.3–1.2)
Total Protein: 7.2 g/dL (ref 6.5–8.1)

## 2022-09-29 LAB — CBC WITH DIFFERENTIAL/PLATELET
Abs Immature Granulocytes: 0.05 10*3/uL (ref 0.00–0.07)
Basophils Absolute: 0 10*3/uL (ref 0.0–0.1)
Basophils Relative: 0 %
Eosinophils Absolute: 0.1 10*3/uL (ref 0.0–0.5)
Eosinophils Relative: 1 %
HCT: 42.4 % (ref 39.0–52.0)
Hemoglobin: 14.4 g/dL (ref 13.0–17.0)
Immature Granulocytes: 0 %
Lymphocytes Relative: 16 %
Lymphs Abs: 2.2 10*3/uL (ref 0.7–4.0)
MCH: 31.7 pg (ref 26.0–34.0)
MCHC: 34 g/dL (ref 30.0–36.0)
MCV: 93.4 fL (ref 80.0–100.0)
Monocytes Absolute: 2.4 10*3/uL — ABNORMAL HIGH (ref 0.1–1.0)
Monocytes Relative: 18 %
Neutro Abs: 9 10*3/uL — ABNORMAL HIGH (ref 1.7–7.7)
Neutrophils Relative %: 65 %
Platelets: 186 10*3/uL (ref 150–400)
RBC: 4.54 MIL/uL (ref 4.22–5.81)
RDW: 13.7 % (ref 11.5–15.5)
WBC: 13.8 10*3/uL — ABNORMAL HIGH (ref 4.0–10.5)
nRBC: 0 % (ref 0.0–0.2)

## 2022-09-29 LAB — TROPONIN I (HIGH SENSITIVITY): Troponin I (High Sensitivity): 5 ng/L (ref <18)

## 2022-09-29 LAB — BRAIN NATRIURETIC PEPTIDE: B Natriuretic Peptide: 50.1 pg/mL (ref 0.0–100.0)

## 2022-09-29 MED ORDER — DEXAMETHASONE SODIUM PHOSPHATE 10 MG/ML IJ SOLN
10.0000 mg | Freq: Once | INTRAMUSCULAR | Status: AC
  Administered 2022-09-29: 10 mg via INTRAVENOUS
  Filled 2022-09-29: qty 1

## 2022-09-29 MED ORDER — OXYCODONE HCL 5 MG PO TABS: 5.0000 mg | ORAL_TABLET | ORAL | 0 refills | Status: DC | PRN

## 2022-09-29 MED ORDER — DIAZEPAM 5 MG/ML IJ SOLN
2.5000 mg | Freq: Once | INTRAMUSCULAR | Status: AC
  Administered 2022-09-29: 2.5 mg via INTRAVENOUS
  Filled 2022-09-29: qty 2

## 2022-09-29 MED ORDER — CYCLOBENZAPRINE HCL 10 MG PO TABS: 10.0000 mg | ORAL_TABLET | Freq: Two times a day (BID) | ORAL | 0 refills | Status: DC | PRN

## 2022-09-29 MED ORDER — IOHEXOL 350 MG/ML SOLN
100.0000 mL | Freq: Once | INTRAVENOUS | Status: AC | PRN
  Administered 2022-09-29: 100 mL via INTRAVENOUS

## 2022-09-29 MED ORDER — LIDOCAINE 5 % EX PTCH: 1.0000 | MEDICATED_PATCH | CUTANEOUS | 0 refills | Status: DC

## 2022-09-29 NOTE — ED Triage Notes (Signed)
Neck pain x 4 days, painful ROM . Pain radiates to shoulders . No fever , no fall or injury

## 2022-09-29 NOTE — ED Provider Notes (Signed)
[facility] Provider Note   CSN: [id number] Arrival date & time: 09/29/22  4098     History  Chief Complaint  Patient presents with   Neck Pain    [name] [name] [name] [name] is a 77 y.o. male.  HPI     77 year old male with a history of COPD, hypertension, hyperlipidemia, diabetes, CAD, medtronic PPM, CKD, who presents with concern for neck pain. Also reports yesterday being unable to speak.  Difficult to describe the speaking difficulty-felt like he physically was unable to vocalize and that it lasted all day.  Did not have word finding problems or slurred speech, just was not able to get the words out of his throat. Denies numbness, weakness, difficulty walking, visual changes or facial droop.   His primary concern brining him here today is neck pain.  Describes severe pain to back  of neck on both sides, radiates to shoulders, worse with any movement of head. Has been present for 4-5 days, initially thought it was a "crick" in the neck but then it continued to worsen and he has not been able to get comfortable. Did try topical medications without relief.  He does on ROS report some dyspnea. Reports had episode of sharp left sided chest pain also yesterday. No chest pain today> No nausea, vomiting, fever, abdominal pain, falls or headache.  No sore throat.  Past Medical History:  Diagnosis Date   Arthritis    Bradycardia    pacemaker - Medtronic Adapta #[id number] - December 22, 2005   Carpal tunnel syndrome on both sides    Carpal tunnel syndrome, bilateral    Chronic back pain    Chronic kidney disease    COPD (chronic obstructive pulmonary disease) (HCC)    Depression    Diabetes mellitus    type II controled with diet   GERD (gastroesophageal reflux disease)    Headache(784.0)    Hyperlipidemia    Hypertension    Hypertensive retinopathy    Insomnia    Neuromuscular disorder (HCC)    peripheral neuropathy BLE   Pneumonia    Presence of  permanent cardiac pacemaker    Wears glasses      Home Medications Prior to Admission medications   Medication Sig Start Date End Date Taking? Authorizing Provider  cyclobenzaprine (FLEXERIL) 10 MG tablet Take 1 tablet (10 mg total) by mouth 2 (two) times daily as needed for muscle spasms. 09/29/22  Yes Alvira Monday, MD  lidocaine (LIDODERM) 5 % Place 1 patch onto the skin daily. Remove & Discard patch within 12 hours or as directed by MD 09/29/22  Yes Alvira Monday, MD  oxyCODONE (ROXICODONE) 5 MG immediate release tablet Take 1 tablet (5 mg total) by mouth every 4 (four) hours as needed for severe pain. 09/29/22  Yes Alvira Monday, MD  acetaminophen (TYLENOL) 650 MG CR tablet Take 1,300 mg daily as needed by mouth for pain.     [provider]  aspirin 81 MG tablet Take 81 mg by mouth daily.    [provider]  atorvastatin (LIPITOR) 80 MG tablet TAKE 1 TABLET BY MOUTH ONCE  DAILY 07/09/22   Chandrasekhar, Mahesh A, MD  CALCIUM-MAGNESIUM-ZINC PO Take 1 capsule by mouth daily.    [provider]  diltiazem (CARDIZEM SR) 120 MG 12 hr capsule Take 1 capsule (120 mg total) by mouth daily. 04/15/22   Chandrasekhar, [place] Dragon A, MD  docusate sodium (COLACE) 100 MG capsule Take 100 mg by mouth  2 (two) times daily.    [provider]  doxycycline (VIBRA-TABS) 100 MG tablet Take 1 tablet (100 mg total) by mouth 2 (two) times daily. 08/19/22   Parrett, Virgel Bouquet, NP  empagliflozin (JARDIANCE) 10 MG TABS tablet Take 1 tablet (10 mg total) by mouth daily before breakfast. 04/09/22   Chandrasekhar, Mahesh A, MD  ezetimibe (ZETIA) 10 MG tablet Take 1 tablet by mouth once daily 03/11/22   Riley Lam A, MD  famotidine (PEPCID) 20 MG tablet TAKE ONE TABLET BY MOUTH ONCE DAILY AT BEDTIME 06/07/14   Nyoka Cowden, MD  flecainide (TAMBOCOR) 100 MG tablet Take 1 tablet (100 mg total) by mouth 2 (two) times daily. 05/07/22   Sheilah Pigeon, PA-C  furosemide (LASIX) 20 MG  tablet Take 1 tablet (20 mg total) by mouth daily. 05/07/22   Sheilah Pigeon, PA-C  gabapentin (NEURONTIN) 300 MG capsule Take 300 mg by mouth 2 (two) times daily.    [provider]  glimepiride (AMARYL) 1 MG tablet Take 1 tablet by mouth daily. 07/27/14   [provider]  guaiFENesin (MUCINEX) 600 MG 12 hr tablet Take 1 tablet (600 mg total) by mouth 2 (two) times daily. 11/16/17   Jetty Duhamel D, MD  ipratropium-albuterol (DUONEB) 0.5-2.5 (3) MG/3ML SOLN Inhale 3 mLs into the lungs every 6 (six) hours as needed (wheezing or shortness of breath). 01/03/21   Waymon Budge, MD  isosorbide mononitrate (IMDUR) 60 MG 24 hr tablet Take 1 tablet (60 mg total) by mouth daily. 07/16/22   Christell Constant, MD  Lancets (ONETOUCH DELICA PLUS [place]) MISC daily at 6 (six) AM. 04/03/22   [provider]  levocetirizine (XYZAL) 5 MG tablet Take 5 mg at bedtime by mouth.  07/25/14   [provider]  metoprolol tartrate (LOPRESSOR) 50 MG tablet Take 1 tablet (50 mg total) by mouth 2 (two) times daily. 02/13/22   Duke Salvia, MD  montelukast (SINGULAIR) 10 MG tablet Take 10 mg at bedtime by mouth.  07/20/14   [provider]  Multiple Vitamin (MULTIVITAMIN WITH MINERALS) TABS tablet Take 1 tablet by mouth daily.    [provider]  nitroGLYCERIN (NITROSTAT) 0.4 MG SL tablet DISSOLVE ONE TABLET UNDER THE TONGUE EVERY 5 MINUTES AS NEEDED FOR CHEST PAIN.  DO NOT EXCEED A TOTAL OF 3 DOSES IN 15 MINUTES 02/04/22   Chandrasekhar, Mahesh A, MD  olmesartan (BENICAR) 20 MG tablet Take 10 mg by mouth daily.    [provider]  [facility] VERIO test strip 1 each by Other route daily. 04/04/22   [provider]  pantoprazole (PROTONIX) 40 MG tablet TAKE ONE TABLET BY MOUTH ONCE DAILY TAKE  30-60  MINUTES  BEFORE  FIRST  MEAL  OF  THE  DAY 06/07/14   Nyoka Cowden, MD  predniSONE (DELTASONE) 10 MG tablet 4 tabs for 2 days, then 3 tabs for 2 days, 2  tabs for 2 days, then 1 tab  daily 08/19/22   Parrett, Virgel Bouquet, NP  PROAIR HFA 108 (90 BASE) MCG/ACT inhaler Inhale 2 puffs into the lungs 4 (four) times daily as needed for wheezing or shortness of breath. 07/27/14   [provider]  Respiratory Therapy Supplies (FLUTTER) DEVI Blow through 4 times per set, 3 sets per day when needed to clear lungs 02/11/16   Waymon Budge, MD  TRELEGY ELLIPTA 100-62.5-25 MCG/ACT AEPB USE 1 INHALATION BY MOUTH DAILY 02/13/22   Maple Hudson, Castalia  D, MD      Allergies    Penicillins and Codeine    Review of Systems   Review of Systems  Physical Exam Updated Vital Signs BP 109/63   Pulse 65   Temp 98.2 F (36.8 C)   Resp (!) 22   Wt 99.8 kg   SpO2 97%   BMI 29.84 kg/m  Physical Exam Vitals and nursing note reviewed.  Constitutional:      General: He is not in acute distress.    Appearance: Normal appearance. He is well-developed. He is not ill-appearing or diaphoretic.  HENT:     Head: Normocephalic and atraumatic.  Eyes:     General: No visual field deficit.    Extraocular Movements: Extraocular movements intact.     Conjunctiva/sclera: Conjunctivae normal.     Pupils: Pupils are equal, round, and reactive to light.  Cardiovascular:     Rate and Rhythm: Normal rate and regular rhythm.     Pulses: Normal pulses.     Heart sounds: Normal heart sounds. No murmur heard.    No friction rub. No gallop.  Pulmonary:     Effort: Pulmonary effort is normal. No respiratory distress.     Breath sounds: Normal breath sounds. No wheezing or rales.  Abdominal:     General: There is no distension.     Palpations: Abdomen is soft.     Tenderness: There is no abdominal tenderness. There is no guarding.  Musculoskeletal:        General: Tenderness (bilateral cervical spine into trapezius top of back, significant pain with ROM) present. No swelling.     Cervical back: Normal range of motion.  Skin:    General: Skin is warm and dry.     Findings: No  erythema or rash.  Neurological:     General: No focal deficit present.     Mental Status: He is alert and oriented to person, place, and time.     GCS: GCS eye subscore is 4. GCS verbal subscore is 5. GCS motor subscore is 6.     Cranial Nerves: No cranial nerve deficit, dysarthria or facial asymmetry.     Sensory: No sensory deficit.     Motor: No weakness or tremor.     Coordination: Coordination normal. Finger-Nose-Finger Test normal.     Gait: Gait normal.     ED Results / Procedures / Treatments   Labs (all labs ordered are listed, but only abnormal results are displayed) Labs Reviewed  CBC WITH DIFFERENTIAL/PLATELET - Abnormal; Notable for the following components:      Result Value   WBC 13.8 (*)    Neutro Abs 9.0 (*)    Monocytes Absolute 2.4 (*)    All other components within normal limits  COMPREHENSIVE METABOLIC PANEL - Abnormal; Notable for the following components:   Sodium 134 (*)    Potassium 3.4 (*)    Glucose, Bld 104 (*)    BUN 27 (*)    Calcium 8.3 (*)    GFR, Estimated 60 (*)    All other components within normal limits  BRAIN NATRIURETIC PEPTIDE  TROPONIN I (HIGH SENSITIVITY)    EKG EKG Interpretation  Date/Time:  Monday Sep 29 2022 10:14:30 EDT Ventricular Rate:  66 PR Interval:  313 QRS Duration: 112 QT Interval:  495 QTC Calculation: 519 R Axis:   -56 Text Interpretation: Sinus or ectopic atrial rhythm Prolonged PR interval Incomplete right bundle branch block Inferior infarct, old Probable anterior infarct, age  indeterminate Prolonged QT interval No significant change since last tracing Confirmed by Alvira Monday ([id number]) on 09/29/2022 11:08:00 AM  Radiology CT C-SPINE NO CHARGE  Result Date: 09/29/2022 CLINICAL DATA:  Neck pain radiating to the shoulders. Difficulty speaking. Carotid aneurysm versus neck abscess versus stroke. EXAM: CT CERVICAL SPINE WITH CONTRAST TECHNIQUE: Multiplanar CT images of the cervical spine were reconstructed  from contemporary CTA of the Neck. RADIATION DOSE REDUCTION: This exam was performed according to the departmental dose-optimization program which includes automated exposure control, adjustment of the mA and/or kV according to patient size and/or use of iterative reconstruction technique. CONTRAST:  No additional. COMPARISON:  None Available. FINDINGS: Alignment: Trace anterolisthesis of C3 on C4 and C4 on C5, degenerative in appearance. Skull base and vertebrae: No acute fracture or suspicious osseous lesion. Soft tissues and spinal canal: No prevertebral fluid or swelling. No visible canal hematoma. Disc levels: Similar appearance of cervical disc degeneration compared to the prior study, greatest at C6-7 and C7-T1 where there is severe disc space narrowing and prominent degenerative endplate changes. Moderate disc degeneration at C5-6. Widespread advanced facet arthrosis. Moderate to severe neural foraminal stenosis bilaterally at C3-4 and on the left at C4-5. Upper chest: No mass or consolidation in the included lung apices. Other: CTA reported separately. IMPRESSION: 1. No acute osseous abnormality. 2. Advanced cervical disc and facet degeneration with moderate to severe multilevel neural foraminal stenosis. Electronically Signed   By: Sebastian Ache M.D.   On: 09/29/2022 13:06   CT ANGIO HEAD NECK W WO CM  Result Date: 09/29/2022 CLINICAL DATA:  Neck pain radiating to the shoulders. Difficulty speaking. Carotid aneurysm versus neck abscess versus stroke. EXAM: CT ANGIOGRAPHY HEAD AND NECK WITH AND WITHOUT CONTRAST TECHNIQUE: Multidetector CT imaging of the head and neck was performed using the standard protocol during bolus administration of intravenous contrast. Multiplanar CT image reconstructions and MIPs were obtained to evaluate the vascular anatomy. Carotid stenosis measurements (when applicable) are obtained utilizing NASCET criteria, using the distal internal carotid diameter as the denominator.  RADIATION DOSE REDUCTION: This exam was performed according to the departmental dose-optimization program which includes automated exposure control, adjustment of the mA and/or kV according to patient size and/or use of iterative reconstruction technique. CONTRAST:  OMNIPAQUE IOHEXOL 350 MG/ML SOLN COMPARISON:  None Available. FINDINGS: CT HEAD FINDINGS Brain: There is no evidence of an acute infarct, intracranial hemorrhage, mass, midline shift, or extra-axial fluid collection. Mild cerebral atrophy is within normal limits for age. Hypodensities in the cerebral white matter bilaterally are nonspecific but compatible with mild chronic small vessel ischemic disease, with note of some asymmetric involvement of the left corona radiata. Vascular: No hyperdense vessel. Skull: No acute fracture or suspicious osseous lesion. Sinuses/Orbits: Paranasal sinuses and mastoid air cells are clear. Bilateral cataract extraction. Other: None. Review of the MIP images confirms the above findings CTA NECK FINDINGS Aortic arch: Normal variant 4 vessel aortic arch with the left vertebral artery arising directly from the arch. Wide patency of the brachiocephalic and subclavian arteries. Right carotid system: Patent with a small amount of calcified plaque in the carotid bulb. No evidence of a significant stenosis or dissection. Tortuous proximal ICA. Left carotid system: Patent with a small amount of calcified plaque at the carotid bifurcation. No evidence of a significant stenosis or dissection. Tortuous distal cervical ICA. Vertebral arteries: Patent with the right being dominant. No evidence of a significant stenosis or dissection. Skeleton: Cervical spine reported separately. Other neck: No evidence of  cervical lymphadenopathy or mass. Upper chest: No mass or consolidation in the included lung apices. Review of the MIP images confirms the above findings CTA HEAD FINDINGS Anterior circulation: The internal carotid arteries are  widely patent from skull base to carotid termini. ACAs and MCAs are patent without evidence of a proximal branch occlusion or significant proximal stenosis. No aneurysm is identified. Posterior circulation: The intracranial vertebral arteries are widely patent to the basilar. Patent AICA and SCA origins are visualized bilaterally. There are likely small posterior communicating arteries bilaterally. Both PCAs are patent without evidence of a significant proximal stenosis. No aneurysm is identified. Venous sinuses: Patent. Anatomic variants: None. Review of the MIP images confirms the above findings IMPRESSION: 1. Mild atherosclerosis in the head and neck without a large vessel occlusion, significant stenosis, or aneurysm. 2. No evidence of acute intracranial abnormality. 3. Mild chronic small vessel ischemic disease. Electronically Signed   By: Sebastian Ache M.D.   On: 09/29/2022 12:58   DG Chest Portable 1 View  Result Date: 09/29/2022 CLINICAL DATA:  77 year old male with history of shortness of breath. EXAM: PORTABLE CHEST 1 VIEW COMPARISON:  Chest x-ray 08/19/2022. FINDINGS: Mild chronic linear scarring in the periphery of the left lung base is similar to prior examinations. No acute consolidative airspace disease. No pleural effusions. No pneumothorax. No evidence of pulmonary edema. Heart size is borderline enlarged. Upper mediastinal contours are within normal limits. Left-sided pacemaker device in place with lead tips projecting over the expected location of the right atrium and right ventricle. IMPRESSION: 1. No radiographic evidence of acute cardiopulmonary disease. Electronically Signed   By: Trudie Reed M.D.   On: 09/29/2022 11:29    Procedures Procedures    Medications Ordered in ED Medications  diazepam (VALIUM) injection 2.5 mg (2.5 mg Intravenous Given 09/29/22 1023)  iohexol (OMNIPAQUE) 350 MG/ML injection 100 mL (100 mLs Intravenous Contrast Given 09/29/22 1213)  dexamethasone  (DECADRON) injection 10 mg (10 mg Intravenous Given 09/29/22 1333)    ED Course/ Medical Decision Making/ A&P                              77 year old male with a history of COPD, hypertension, hyperlipidemia, diabetes, CAD, medtronic PPM, CKD, who presents with concern for neck pain. Also reports yesterday being unable to speak.   DDx includes CVA, dissection, ACS, ICH, cervical stenosis, muscular strain.  Given neck pain with difficulty speaking ordered CTA head and neck to evaluate for dissection or occlusion, and CSpine no charge given severity of pain, age to evaluate for fracture or other significant spondylosis.  On ROS also reported dyspnea and episode of CP yesterday.  EKG Evaluated by me without acute abnormalities. XR completed and personally evaluated and interpreted by me shows no evidence of acute abnormalities .  Labs completed and personally eval and interpreted by me show mild leukocytosis, no anemia, electrolytes without clinically significant abnormalities, troponin WNL, BNP WNL, doubt ACS, CHF.  CTA returned showing no dissection, oculsion, stenosis or aneurysm, and CSpine with advanced disc and facet degeneration with moderate to severe multilevel meural foraminal stenosis.    Suspect neck pain related to muscular spasm and possible disc disease. Given flexeril, rx for oxycodone after discussion of risks, recommend lidocaine, tylenol.    Discussed difficulty speaking more with family---report it was not word finding, dysarthria, no other neurologic symptoms. They said it seemed more like a weak voice overall like laryngitis that lasted  all day. Unclear if this was secondary to pain, due to disc disease.  He cannot have MRI given pacemaker, does not have speech problems now, and history not clearly consistent with TIA.  Patient/family states understanding.    Recommend PCP follow up, discussed reasons to return. Patient discharged in stable condition with understanding of  reasons to return.          Final Clinical Impression(s) / ED Diagnoses Final diagnoses:  Neck pain  Osteoarthritis of cervical spine, unspecified spinal osteoarthritis complication status  Muscle spasm    Rx / DC Orders ED Discharge Orders          Ordered    oxyCODONE (ROXICODONE) 5 MG immediate release tablet  Every 4 hours PRN        09/29/22 1329    cyclobenzaprine (FLEXERIL) 10 MG tablet  2 times daily PRN        09/29/22 1329    lidocaine (LIDODERM) 5 %  Every 24 hours        09/29/22 1329              Alvira Monday, MD 09/29/22 2239

## 2022-10-01 NOTE — Progress Notes (Unsigned)
HPI  M former smoker with COPD, chronic cough, OSA complicated by Cardiac dysrhythmia/ pacemaker, HBP  Office Spirometry 08/11/14-moderate obstruction/restriction-FVC 2.95/62%, FEV1 2.37/65%, ratio 0.79, FEF 25-75% 2.43/77% PFT 10/26/2017-normal flows without response to bronchodilator, moderate diffusion defect. Walk Test O2 Qualifying-04/26/2018-qualified for portable oxygen-during his second lap heart rate reached 112 and saturation fell to 86% on room air Labs 04/26/2018- IgE 464, EOS wnl HST 05/17/18>> Severe obstructive sleep apnea AHI:33 CPAP to BIPAP titration 02/18/2019-  BIPAP 10/6, if not tolerated try CPAP 7  -----------------------------------------------------------------------------------------    01/03/22- 77 year old male former smoker followed for COPD, restrictive lung disease, cough, OSA, complicated by CAD, CHF, cardiac arrhythmia/Pacemaker/ ICD, HBP, DM  BIPAP 10/6/ [employer] 10 S machine -Trelegy 100, Proair hfa, prednisone 10 mg QOD, Singulair, neb Duoneb, Download- compliance 53%, AHI 6.1/ hr Body weight today-215 lbs Covid vax-3 Moderna ED 8/14- fever, diarrhea> zithromax -----Pt f/u for OSA - BiPAP is working well, pt states he's been dealing w/ some sickness and hip pain which has cut back his sleep/usage.  Breathing comfortable with no recent issues. Sleep and BiPAP use impacted by illness this summer. Colitis may be improved. Bothersome L hip pain now with hx R THA. CXR 12/16/21- IMPRESSION: 1. No acute cardiopulmonary process. 2. Stable cardiomegaly.  10/02/22- 77 year old male former smoker followed for COPD, restrictive lung disease, cough, OSA, complicated by CAD, CHF, cardiac arrhythmia/Pacemaker/ ICD, HBP, DM  BIPAP 10/6/ [employer] 10 S machine -Trelegy 100, Proair hfa, prednisone 10 mg QOD, Singulair, neb Duoneb, Download- compliance 63%, AHI 5.8/ hr Body weight today 227 lbs LOV 4/16 Parrett, NP- acutre  exacerb COPD. Walk test > 90% on room air. >> Pred taper, doxy, Flutter valve, Zyrtec at hs. Pending Cardiac PET in June.                  Here with granddaughter. -----Pt is doing okay He resolved breathing flareup from April and feels he is back to baseline now.  Uses Trelegy routinely, prednisone every other day and ProAir occasionally.  Asks refill nebulizer solution. Download reviewed.  He continues BiPAP.  We discussed compliance goals.  He does feel he is better off with BiPAP than without. He is wearing a cervical hard collar after degenerative disc disease with nerve impingement, hoping to avoid surgery. CXR 08/19/22- IMPRESSION: Band-like opacity at the left lung base, favor atelectasis.  ROS-see HPI   + = positive Constitutional:    weight loss, night sweats, fevers, chills, fatigue, lassitude. HEENT:    headaches, difficulty swallowing, tooth/dental problems, sore throat,       sneezing, itching, ear ache, +nasal congestion, +post nasal drip, snoring CV:    +chest pain, orthopnea, PND, swelling in lower extremities, anasarca, dizziness, palpitations Resp:   +shortness of breath with exertion or at rest.                +productive cough,   + non-productive cough, coughing up of blood.              change in color of mucus.  +wheezing.   Skin:    rash or lesions. GI:  No-   heartburn, indigestion, abdominal pain, nausea, vomiting, GU:  MS:   joint pain, stiffness,  Neuro-     nothing unusual Psych:  change in mood or affect.  depression or anxiety.   memory loss.  OBJ- Physical Exam General- Alert, Oriented, Affect-appropriate, Distress- none acute,  Skin-  rash-none, lesions- none, excoriation- none Lymphadenopathy- none Head- atraumatic            Eyes- Gross vision intact, PERRLA, conjunctivae and secretions clear            Ears- Hearing, canals-normal            Nose- Clear, no-Septal dev, mucus, polyps, erosion, perforation             Throat- Mallampati II , mucosa  clear , drainage-none, tonsils- atrophic, edentulous+, not hoarse Neck- + cervical collar Chest - symmetrical excursion , unlabored           Heart/CV- RRR , no murmur , no gallop  , no rub, nl s1 s2                           - JVD- none , edema- none, stasis changes- none, varices- none           Lung- +clear, cough-none, dullness-none, rub- none,            Chest wall- +L pacemaker Abd-  Br/ Gen/ Rectal- Not done, not indicated Extrem- cyanosis- none, clubbing, none, atrophy- none, strength- nl, +cane Neuro- grossly intact to observation

## 2022-10-02 ENCOUNTER — Encounter: Payer: Self-pay | Admitting: Internal Medicine

## 2022-10-02 ENCOUNTER — Ambulatory Visit (INDEPENDENT_AMBULATORY_CARE_PROVIDER_SITE_OTHER): Payer: 59 | Admitting: Internal Medicine

## 2022-10-02 VITALS — BP 128/88 | HR 95 | Ht 72.0 in | Wt 227.8 lb

## 2022-10-02 DIAGNOSIS — J4489 Other specified chronic obstructive pulmonary disease: Secondary | ICD-10-CM

## 2022-10-02 DIAGNOSIS — G4733 Obstructive sleep apnea (adult) (pediatric): Secondary | ICD-10-CM

## 2022-10-02 MED ORDER — IPRATROPIUM-ALBUTEROL 0.5-2.5 (3) MG/3ML IN SOLN: 3.0000 mL | Freq: Four times a day (QID) | RESPIRATORY_TRACT | 12 refills | Status: DC | PRN

## 2022-10-02 NOTE — Assessment & Plan Note (Signed)
Benefits from his BiPAP.  Does need to work on improving compliance as discussed and he says he can do it. Plan-continue BiPAP 10/6

## 2022-10-02 NOTE — Assessment & Plan Note (Signed)
Back at baseline now.  Chest is clear to exam.  He will continue current meds as reviewed. Plan-refill DuoNeb

## 2022-10-02 NOTE — Patient Instructions (Addendum)
Good luck with your neck !  We can continue BIPAP 10/6  Duoneb nebulizer solution refilled  Please call if we can help

## 2022-10-03 ENCOUNTER — Telehealth (HOSPITAL_COMMUNITY): Payer: Self-pay | Admitting: Emergency Medicine

## 2022-10-03 DIAGNOSIS — I498 Other specified cardiac arrhythmias: Secondary | ICD-10-CM

## 2022-10-03 NOTE — Addendum Note (Signed)
Addended by: Macie Burows on: 10/03/2022 03:30 PM   Modules accepted: Orders

## 2022-10-05 NOTE — Telephone Encounter (Signed)
Orders only

## 2022-10-07 ENCOUNTER — Telehealth (HOSPITAL_COMMUNITY): Payer: Self-pay | Admitting: Emergency Medicine

## 2022-10-07 NOTE — Telephone Encounter (Signed)
Reaching out to patient to offer assistance regarding upcoming cardiac imaging study; pt verbalizes understanding of appt date/time, parking situation and where to check in, pre-test NPO status and medications ordered, and verified current allergies; name and call back number provided for further questions should they arise Chosen Geske RN Navigator Cardiac Imaging [facility] [phone] office [phone] cell 

## 2022-10-08 ENCOUNTER — Encounter (HOSPITAL_COMMUNITY): Payer: Self-pay

## 2022-10-08 ENCOUNTER — Encounter (HOSPITAL_COMMUNITY): Admission: RE | Admit: 2022-10-08 | Payer: 59 | Source: Ambulatory Visit

## 2022-10-08 ENCOUNTER — Telehealth: Payer: Self-pay | Admitting: Internal Medicine

## 2022-10-08 ENCOUNTER — Inpatient Hospital Stay (HOSPITAL_COMMUNITY)
Admission: EM | Admit: 2022-10-08 | Discharge: 2022-10-10 | DRG: 643 | Disposition: A | Discharge status: Home / Self Care | Payer: 59 | Attending: Internal Medicine | Admitting: Internal Medicine

## 2022-10-08 ENCOUNTER — Emergency Department (HOSPITAL_COMMUNITY): Payer: 59

## 2022-10-08 ENCOUNTER — Other Ambulatory Visit: Payer: Self-pay

## 2022-10-08 DIAGNOSIS — I5032 Chronic diastolic (congestive) heart failure: Secondary | ICD-10-CM | POA: Diagnosis not present

## 2022-10-08 DIAGNOSIS — E11319 Type 2 diabetes mellitus with unspecified diabetic retinopathy without macular edema: Secondary | ICD-10-CM | POA: Diagnosis present

## 2022-10-08 DIAGNOSIS — K219 Gastro-esophageal reflux disease without esophagitis: Secondary | ICD-10-CM | POA: Diagnosis present

## 2022-10-08 DIAGNOSIS — R410 Disorientation, unspecified: Secondary | ICD-10-CM | POA: Diagnosis not present

## 2022-10-08 DIAGNOSIS — E1142 Type 2 diabetes mellitus with diabetic polyneuropathy: Secondary | ICD-10-CM | POA: Diagnosis present

## 2022-10-08 DIAGNOSIS — I495 Sick sinus syndrome: Secondary | ICD-10-CM | POA: Diagnosis not present

## 2022-10-08 DIAGNOSIS — J9612 Chronic respiratory failure with hypercapnia: Secondary | ICD-10-CM | POA: Diagnosis present

## 2022-10-08 DIAGNOSIS — R270 Ataxia, unspecified: Secondary | ICD-10-CM | POA: Diagnosis present

## 2022-10-08 DIAGNOSIS — R059 Cough, unspecified: Secondary | ICD-10-CM | POA: Diagnosis not present

## 2022-10-08 DIAGNOSIS — I25118 Atherosclerotic heart disease of native coronary artery with other forms of angina pectoris: Secondary | ICD-10-CM | POA: Diagnosis not present

## 2022-10-08 DIAGNOSIS — Z87892 Personal history of anaphylaxis: Secondary | ICD-10-CM

## 2022-10-08 DIAGNOSIS — E871 Hypo-osmolality and hyponatremia: Secondary | ICD-10-CM

## 2022-10-08 DIAGNOSIS — I10 Essential (primary) hypertension: Secondary | ICD-10-CM | POA: Diagnosis not present

## 2022-10-08 DIAGNOSIS — E785 Hyperlipidemia, unspecified: Secondary | ICD-10-CM | POA: Diagnosis present

## 2022-10-08 DIAGNOSIS — J441 Chronic obstructive pulmonary disease with (acute) exacerbation: Secondary | ICD-10-CM | POA: Diagnosis not present

## 2022-10-08 DIAGNOSIS — I472 Ventricular tachycardia, unspecified: Secondary | ICD-10-CM | POA: Diagnosis present

## 2022-10-08 DIAGNOSIS — M1990 Unspecified osteoarthritis, unspecified site: Secondary | ICD-10-CM | POA: Diagnosis present

## 2022-10-08 DIAGNOSIS — Z7984 Long term (current) use of oral hypoglycemic drugs: Secondary | ICD-10-CM

## 2022-10-08 DIAGNOSIS — G4733 Obstructive sleep apnea (adult) (pediatric): Secondary | ICD-10-CM | POA: Diagnosis present

## 2022-10-08 DIAGNOSIS — Z950 Presence of cardiac pacemaker: Secondary | ICD-10-CM | POA: Diagnosis not present

## 2022-10-08 DIAGNOSIS — E669 Obesity, unspecified: Secondary | ICD-10-CM | POA: Diagnosis present

## 2022-10-08 DIAGNOSIS — R0602 Shortness of breath: Secondary | ICD-10-CM | POA: Diagnosis not present

## 2022-10-08 DIAGNOSIS — Z1152 Encounter for screening for COVID-19: Secondary | ICD-10-CM

## 2022-10-08 DIAGNOSIS — G9340 Encephalopathy, unspecified: Secondary | ICD-10-CM | POA: Diagnosis present

## 2022-10-08 DIAGNOSIS — J9811 Atelectasis: Secondary | ICD-10-CM | POA: Diagnosis not present

## 2022-10-08 DIAGNOSIS — Z6830 Body mass index (BMI) 30.0-30.9, adult: Secondary | ICD-10-CM

## 2022-10-08 DIAGNOSIS — E222 Syndrome of inappropriate secretion of antidiuretic hormone: Principal | ICD-10-CM | POA: Diagnosis present

## 2022-10-08 DIAGNOSIS — M542 Cervicalgia: Secondary | ICD-10-CM | POA: Diagnosis not present

## 2022-10-08 DIAGNOSIS — Z87891 Personal history of nicotine dependence: Secondary | ICD-10-CM

## 2022-10-08 DIAGNOSIS — Z9181 History of falling: Secondary | ICD-10-CM

## 2022-10-08 DIAGNOSIS — Z7982 Long term (current) use of aspirin: Secondary | ICD-10-CM

## 2022-10-08 DIAGNOSIS — Z96641 Presence of right artificial hip joint: Secondary | ICD-10-CM | POA: Diagnosis present

## 2022-10-08 DIAGNOSIS — R9431 Abnormal electrocardiogram [ECG] [EKG]: Secondary | ICD-10-CM

## 2022-10-08 DIAGNOSIS — M549 Dorsalgia, unspecified: Secondary | ICD-10-CM | POA: Diagnosis not present

## 2022-10-08 DIAGNOSIS — E1169 Type 2 diabetes mellitus with other specified complication: Secondary | ICD-10-CM | POA: Diagnosis not present

## 2022-10-08 DIAGNOSIS — G9341 Metabolic encephalopathy: Secondary | ICD-10-CM | POA: Diagnosis not present

## 2022-10-08 DIAGNOSIS — R001 Bradycardia, unspecified: Secondary | ICD-10-CM | POA: Diagnosis present

## 2022-10-08 DIAGNOSIS — Z7952 Long term (current) use of systemic steroids: Secondary | ICD-10-CM

## 2022-10-08 DIAGNOSIS — N182 Chronic kidney disease, stage 2 (mild): Secondary | ICD-10-CM | POA: Diagnosis present

## 2022-10-08 DIAGNOSIS — Z8249 Family history of ischemic heart disease and other diseases of the circulatory system: Secondary | ICD-10-CM

## 2022-10-08 DIAGNOSIS — Z880 Allergy status to penicillin: Secondary | ICD-10-CM

## 2022-10-08 DIAGNOSIS — Z8701 Personal history of pneumonia (recurrent): Secondary | ICD-10-CM

## 2022-10-08 DIAGNOSIS — J4489 Other specified chronic obstructive pulmonary disease: Secondary | ICD-10-CM | POA: Diagnosis present

## 2022-10-08 DIAGNOSIS — Z79899 Other long term (current) drug therapy: Secondary | ICD-10-CM

## 2022-10-08 DIAGNOSIS — Z885 Allergy status to narcotic agent status: Secondary | ICD-10-CM

## 2022-10-08 DIAGNOSIS — G8929 Other chronic pain: Secondary | ICD-10-CM | POA: Diagnosis not present

## 2022-10-08 DIAGNOSIS — Z91199 Patient's noncompliance with other medical treatment and regimen due to unspecified reason: Secondary | ICD-10-CM

## 2022-10-08 DIAGNOSIS — R4182 Altered mental status, unspecified: Secondary | ICD-10-CM | POA: Insufficient documentation

## 2022-10-08 DIAGNOSIS — Z825 Family history of asthma and other chronic lower respiratory diseases: Secondary | ICD-10-CM

## 2022-10-08 LAB — I-STAT VENOUS BLOOD GAS, ED
Acid-Base Excess: 1 mmol/L (ref 0.0–2.0)
Bicarbonate: 25.7 mmol/L (ref 20.0–28.0)
Calcium, Ion: 1.02 mmol/L — ABNORMAL LOW (ref 1.15–1.40)
HCT: 47 % (ref 39.0–52.0)
Hemoglobin: 16 g/dL (ref 13.0–17.0)
O2 Saturation: 99 %
Potassium: 3.5 mmol/L (ref 3.5–5.1)
Sodium: 126 mmol/L — ABNORMAL LOW (ref 135–145)
TCO2: 27 mmol/L (ref 22–32)
pCO2, Ven: 41.1 mmHg — ABNORMAL LOW (ref 44–60)
pH, Ven: 7.403 (ref 7.25–7.43)
pO2, Ven: 145 mmHg — ABNORMAL HIGH (ref 32–45)

## 2022-10-08 LAB — CBC
HCT: 43.3 % (ref 39.0–52.0)
Hemoglobin: 14.4 g/dL (ref 13.0–17.0)
MCH: 30.4 pg (ref 26.0–34.0)
MCHC: 33.3 g/dL (ref 30.0–36.0)
MCV: 91.5 fL (ref 80.0–100.0)
Platelets: 200 10*3/uL (ref 150–400)
RBC: 4.73 MIL/uL (ref 4.22–5.81)
RDW: 13 % (ref 11.5–15.5)
WBC: 11 10*3/uL — ABNORMAL HIGH (ref 4.0–10.5)
nRBC: 0 % (ref 0.0–0.2)

## 2022-10-08 LAB — URINALYSIS, ROUTINE W REFLEX MICROSCOPIC
Bacteria, UA: NONE SEEN
Bilirubin Urine: NEGATIVE
Glucose, UA: >=500 mg/dL — AB
Hgb urine dipstick: NEGATIVE
Ketones, ur: NEGATIVE mg/dL
Leukocytes,Ua: NEGATIVE
Nitrite: NEGATIVE
Protein, ur: NEGATIVE mg/dL
Specific Gravity, Urine: 1.015 (ref 1.005–1.030)
pH: 6 (ref 5.0–8.0)

## 2022-10-08 LAB — CBC WITH DIFFERENTIAL/PLATELET
Abs Immature Granulocytes: 0.04 10*3/uL (ref 0.00–0.07)
Basophils Absolute: 0 10*3/uL (ref 0.0–0.1)
Basophils Relative: 0 %
Eosinophils Absolute: 0.1 10*3/uL (ref 0.0–0.5)
Eosinophils Relative: 1 %
HCT: 44.5 % (ref 39.0–52.0)
Hemoglobin: 15.3 g/dL (ref 13.0–17.0)
Immature Granulocytes: 0 %
Lymphocytes Relative: 21 %
Lymphs Abs: 2.1 10*3/uL (ref 0.7–4.0)
MCH: 31.5 pg (ref 26.0–34.0)
MCHC: 34.4 g/dL (ref 30.0–36.0)
MCV: 91.8 fL (ref 80.0–100.0)
Monocytes Absolute: 1.3 10*3/uL — ABNORMAL HIGH (ref 0.1–1.0)
Monocytes Relative: 13 %
Neutro Abs: 6.4 10*3/uL (ref 1.7–7.7)
Neutrophils Relative %: 65 %
Platelets: 206 10*3/uL (ref 150–400)
RBC: 4.85 MIL/uL (ref 4.22–5.81)
RDW: 13.1 % (ref 11.5–15.5)
WBC: 10 10*3/uL (ref 4.0–10.5)
nRBC: 0 % (ref 0.0–0.2)

## 2022-10-08 LAB — RESP PANEL BY RT-PCR (RSV, FLU A&B, COVID)  RVPGX2
Influenza A by PCR: NEGATIVE
Influenza B by PCR: NEGATIVE
Resp Syncytial Virus by PCR: NEGATIVE
SARS Coronavirus 2 by RT PCR: NEGATIVE

## 2022-10-08 LAB — COMPREHENSIVE METABOLIC PANEL
ALT: 22 U/L (ref 0–44)
AST: 29 U/L (ref 15–41)
Albumin: 3.4 g/dL — ABNORMAL LOW (ref 3.5–5.0)
Alkaline Phosphatase: 54 U/L (ref 38–126)
Anion gap: 12 (ref 5–15)
BUN: 18 mg/dL (ref 8–23)
CO2: 19 mmol/L — ABNORMAL LOW (ref 22–32)
Calcium: 8.2 mg/dL — ABNORMAL LOW (ref 8.9–10.3)
Chloride: 93 mmol/L — ABNORMAL LOW (ref 98–111)
Creatinine, Ser: 1.03 mg/dL (ref 0.61–1.24)
GFR, Estimated: >60 mL/min (ref >60)
Glucose, Bld: 148 mg/dL — ABNORMAL HIGH (ref 70–99)
Potassium: 3.4 mmol/L — ABNORMAL LOW (ref 3.5–5.1)
Sodium: 124 mmol/L — ABNORMAL LOW (ref 135–145)
Total Bilirubin: 0.9 mg/dL (ref 0.3–1.2)
Total Protein: 6.6 g/dL (ref 6.5–8.1)

## 2022-10-08 LAB — RAPID URINE DRUG SCREEN, HOSP PERFORMED
Amphetamines: NOT DETECTED
Barbiturates: NOT DETECTED
Benzodiazepines: NOT DETECTED
Cocaine: NOT DETECTED
Opiates: NOT DETECTED
Tetrahydrocannabinol: NOT DETECTED

## 2022-10-08 LAB — GLUCOSE, CAPILLARY: Glucose-Capillary: 234 mg/dL — ABNORMAL HIGH (ref 70–99)

## 2022-10-08 LAB — CREATININE, SERUM
Creatinine, Ser: 1.27 mg/dL — ABNORMAL HIGH (ref 0.61–1.24)
GFR, Estimated: 57 mL/min — ABNORMAL LOW (ref >60)

## 2022-10-08 LAB — BLOOD GAS, VENOUS
Acid-base deficit: 4.7 mmol/L — ABNORMAL HIGH (ref 0.0–2.0)
Bicarbonate: 19.5 mmol/L — ABNORMAL LOW (ref 20.0–28.0)
O2 Saturation: 87.9 %
Patient temperature: 36.9
pCO2, Ven: 33 mmHg — ABNORMAL LOW (ref 44–60)
pH, Ven: 7.38 (ref 7.25–7.43)
pO2, Ven: 52 mmHg — ABNORMAL HIGH (ref 32–45)

## 2022-10-08 LAB — CREATININE, URINE, RANDOM: Creatinine, Urine: 63 mg/dL

## 2022-10-08 LAB — URIC ACID: Uric Acid, Serum: 3.1 mg/dL — ABNORMAL LOW (ref 3.7–8.6)

## 2022-10-08 LAB — ETHANOL: Alcohol, Ethyl (B): <10 mg/dL (ref <10)

## 2022-10-08 LAB — CBG MONITORING, ED: Glucose-Capillary: 133 mg/dL — ABNORMAL HIGH (ref 70–99)

## 2022-10-08 LAB — NA AND K (SODIUM & POTASSIUM), RAND UR
Potassium Urine: 27 mmol/L
Sodium, Ur: <10 mmol/L

## 2022-10-08 LAB — MAGNESIUM: Magnesium: 1.9 mg/dL (ref 1.7–2.4)

## 2022-10-08 LAB — PHOSPHORUS: Phosphorus: 3.6 mg/dL (ref 2.5–4.6)

## 2022-10-08 LAB — OSMOLALITY, URINE: Osmolality, Ur: 490 mOsm/kg (ref 300–900)

## 2022-10-08 MED ORDER — IPRATROPIUM-ALBUTEROL 0.5-2.5 (3) MG/3ML IN SOLN
3.0000 mL | Freq: Four times a day (QID) | RESPIRATORY_TRACT | Status: AC
  Administered 2022-10-08 – 2022-10-09 (×4): 3 mL via RESPIRATORY_TRACT
  Filled 2022-10-08 (×4): qty 3

## 2022-10-08 MED ORDER — ENOXAPARIN SODIUM 40 MG/0.4ML IJ SOSY
40.0000 mg | PREFILLED_SYRINGE | INTRAMUSCULAR | Status: DC
  Administered 2022-10-08 – 2022-10-09 (×2): 40 mg via SUBCUTANEOUS
  Filled 2022-10-08 (×2): qty 0.4

## 2022-10-08 MED ORDER — LORATADINE 10 MG PO TABS
10.0000 mg | ORAL_TABLET | Freq: Every day | ORAL | Status: DC
  Administered 2022-10-08 – 2022-10-09 (×2): 10 mg via ORAL
  Filled 2022-10-08 (×2): qty 1

## 2022-10-08 MED ORDER — MAGNESIUM SULFATE 2 GM/50ML IV SOLN: 2.0000 g | Freq: Once | INTRAVENOUS | Status: DC

## 2022-10-08 MED ORDER — GLIMEPIRIDE 2 MG PO TABS
2.0000 mg | ORAL_TABLET | Freq: Every day | ORAL | Status: DC
  Administered 2022-10-09 – 2022-10-10 (×2): 2 mg via ORAL
  Filled 2022-10-08 (×2): qty 1

## 2022-10-08 MED ORDER — EMPAGLIFLOZIN 10 MG PO TABS
10.0000 mg | ORAL_TABLET | Freq: Every day | ORAL | Status: DC
  Administered 2022-10-09 – 2022-10-10 (×2): 10 mg via ORAL
  Filled 2022-10-08 (×2): qty 1

## 2022-10-08 MED ORDER — ASPIRIN 81 MG PO TBEC
81.0000 mg | DELAYED_RELEASE_TABLET | Freq: Every day | ORAL | Status: DC
  Administered 2022-10-08 – 2022-10-10 (×3): 81 mg via ORAL
  Filled 2022-10-08 (×3): qty 1

## 2022-10-08 MED ORDER — IPRATROPIUM-ALBUTEROL 0.5-2.5 (3) MG/3ML IN SOLN: 3.0000 mL | RESPIRATORY_TRACT | Status: DC | PRN

## 2022-10-08 MED ORDER — PREDNISONE 5 MG PO TABS
30.0000 mg | ORAL_TABLET | Freq: Every day | ORAL | Status: DC
  Administered 2022-10-09 – 2022-10-10 (×2): 30 mg via ORAL
  Filled 2022-10-08 (×2): qty 2

## 2022-10-08 MED ORDER — ALBUTEROL SULFATE (2.5 MG/3ML) 0.083% IN NEBU
5.0000 mg | INHALATION_SOLUTION | Freq: Once | RESPIRATORY_TRACT | Status: AC
  Administered 2022-10-08: 5 mg via RESPIRATORY_TRACT
  Filled 2022-10-08: qty 6

## 2022-10-08 MED ORDER — OXYCODONE HCL 5 MG PO TABS: 5.0000 mg | ORAL_TABLET | ORAL | Status: DC | PRN

## 2022-10-08 MED ORDER — LACTATED RINGERS IV BOLUS
500.0000 mL | Freq: Once | INTRAVENOUS | Status: AC
  Administered 2022-10-08: 500 mL via INTRAVENOUS

## 2022-10-08 MED ORDER — IPRATROPIUM-ALBUTEROL 0.5-2.5 (3) MG/3ML IN SOLN
3.0000 mL | RESPIRATORY_TRACT | Status: AC
  Administered 2022-10-08 (×2): 3 mL via RESPIRATORY_TRACT
  Filled 2022-10-08: qty 9

## 2022-10-08 MED ORDER — METOPROLOL TARTRATE 50 MG PO TABS
50.0000 mg | ORAL_TABLET | Freq: Two times a day (BID) | ORAL | Status: DC
  Administered 2022-10-08 – 2022-10-10 (×4): 50 mg via ORAL
  Filled 2022-10-08 (×4): qty 1

## 2022-10-08 MED ORDER — ACETAMINOPHEN 325 MG PO TABS: 650.0000 mg | ORAL_TABLET | Freq: Four times a day (QID) | ORAL | Status: DC | PRN

## 2022-10-08 MED ORDER — EZETIMIBE 10 MG PO TABS
10.0000 mg | ORAL_TABLET | Freq: Every day | ORAL | Status: DC
  Administered 2022-10-08 – 2022-10-10 (×3): 10 mg via ORAL
  Filled 2022-10-08 (×3): qty 1

## 2022-10-08 MED ORDER — METOPROLOL TARTRATE 5 MG/5ML IV SOLN: 2.5000 mg | Freq: Once | INTRAVENOUS | Status: DC

## 2022-10-08 MED ORDER — FAMOTIDINE 20 MG PO TABS
20.0000 mg | ORAL_TABLET | Freq: Every day | ORAL | Status: DC
  Administered 2022-10-09 – 2022-10-10 (×2): 20 mg via ORAL
  Filled 2022-10-08 (×2): qty 1

## 2022-10-08 MED ORDER — NITROGLYCERIN 0.4 MG SL SUBL: 0.4000 mg | SUBLINGUAL_TABLET | SUBLINGUAL | Status: DC | PRN

## 2022-10-08 MED ORDER — FLECAINIDE ACETATE 100 MG PO TABS
100.0000 mg | ORAL_TABLET | Freq: Two times a day (BID) | ORAL | Status: DC
  Administered 2022-10-08: 100 mg via ORAL
  Filled 2022-10-08 (×3): qty 1

## 2022-10-08 MED ORDER — DOCUSATE SODIUM 100 MG PO CAPS
100.0000 mg | ORAL_CAPSULE | Freq: Two times a day (BID) | ORAL | Status: DC
  Administered 2022-10-08 – 2022-10-09 (×3): 100 mg via ORAL
  Filled 2022-10-08 (×4): qty 1

## 2022-10-08 MED ORDER — IPRATROPIUM-ALBUTEROL 0.5-2.5 (3) MG/3ML IN SOLN
3.0000 mL | Freq: Once | RESPIRATORY_TRACT | Status: AC
  Administered 2022-10-08: 3 mL via RESPIRATORY_TRACT
  Filled 2022-10-08: qty 3

## 2022-10-08 MED ORDER — IPRATROPIUM-ALBUTEROL 0.5-2.5 (3) MG/3ML IN SOLN
3.0000 mL | RESPIRATORY_TRACT | Status: DC | PRN
  Administered 2022-10-09 – 2022-10-10 (×4): 3 mL via RESPIRATORY_TRACT
  Filled 2022-10-08 (×5): qty 3

## 2022-10-08 MED ORDER — GABAPENTIN 300 MG PO CAPS: 300.0000 mg | ORAL_CAPSULE | Freq: Two times a day (BID) | ORAL | Status: DC

## 2022-10-08 MED ORDER — CYCLOBENZAPRINE HCL 10 MG PO TABS: 10.0000 mg | ORAL_TABLET | Freq: Two times a day (BID) | ORAL | Status: DC | PRN

## 2022-10-08 MED ORDER — ALBUTEROL SULFATE (2.5 MG/3ML) 0.083% IN NEBU
10.0000 mg/h | INHALATION_SOLUTION | RESPIRATORY_TRACT | Status: DC
  Administered 2022-10-08: 10 mg/h via RESPIRATORY_TRACT
  Filled 2022-10-08: qty 12

## 2022-10-08 MED ORDER — ISOSORBIDE MONONITRATE ER 60 MG PO TB24
60.0000 mg | ORAL_TABLET | Freq: Every day | ORAL | Status: DC
  Administered 2022-10-08 – 2022-10-10 (×3): 60 mg via ORAL
  Filled 2022-10-08: qty 1
  Filled 2022-10-08: qty 2
  Filled 2022-10-08: qty 1

## 2022-10-08 MED ORDER — PREDNISONE 20 MG PO TABS
40.0000 mg | ORAL_TABLET | Freq: Once | ORAL | Status: AC
  Administered 2022-10-08: 40 mg via ORAL
  Filled 2022-10-08: qty 2

## 2022-10-08 MED ORDER — POTASSIUM CHLORIDE 10 MEQ/100ML IV SOLN
10.0000 meq | Freq: Once | INTRAVENOUS | Status: AC
  Administered 2022-10-08: 10 meq via INTRAVENOUS
  Filled 2022-10-08: qty 100

## 2022-10-08 MED ORDER — DILTIAZEM HCL ER 60 MG PO CP12
120.0000 mg | ORAL_CAPSULE | Freq: Every day | ORAL | Status: DC
  Administered 2022-10-08 – 2022-10-10 (×3): 120 mg via ORAL
  Filled 2022-10-08 (×4): qty 2

## 2022-10-08 MED ORDER — ATORVASTATIN CALCIUM 80 MG PO TABS
80.0000 mg | ORAL_TABLET | Freq: Every day | ORAL | Status: DC
  Administered 2022-10-08 – 2022-10-10 (×3): 80 mg via ORAL
  Filled 2022-10-08: qty 2
  Filled 2022-10-08 (×2): qty 1

## 2022-10-08 MED ORDER — IRBESARTAN 75 MG PO TABS
37.5000 mg | ORAL_TABLET | Freq: Every day | ORAL | Status: DC
  Administered 2022-10-08 – 2022-10-10 (×3): 37.5 mg via ORAL
  Filled 2022-10-08 (×4): qty 0.5

## 2022-10-08 MED ORDER — MONTELUKAST SODIUM 10 MG PO TABS
10.0000 mg | ORAL_TABLET | Freq: Every day | ORAL | Status: DC
  Administered 2022-10-08 – 2022-10-09 (×2): 10 mg via ORAL
  Filled 2022-10-08 (×2): qty 1

## 2022-10-08 MED ORDER — MAGNESIUM SULFATE IN D5W 1-5 GM/100ML-% IV SOLN
1.0000 g | Freq: Once | INTRAVENOUS | Status: AC
  Administered 2022-10-08: 1 g via INTRAVENOUS
  Filled 2022-10-08 (×2): qty 100

## 2022-10-08 MED ORDER — POTASSIUM CHLORIDE 20 MEQ PO PACK
40.0000 meq | PACK | Freq: Once | ORAL | Status: AC
  Administered 2022-10-08: 40 meq via ORAL
  Filled 2022-10-08: qty 2

## 2022-10-08 MED ORDER — SODIUM CHLORIDE 0.9 % IV SOLN: INTRAVENOUS | Status: DC

## 2022-10-08 MED ORDER — PANTOPRAZOLE SODIUM 40 MG PO TBEC
40.0000 mg | DELAYED_RELEASE_TABLET | Freq: Every day | ORAL | Status: DC
  Administered 2022-10-08 – 2022-10-10 (×3): 40 mg via ORAL
  Filled 2022-10-08 (×4): qty 1

## 2022-10-08 NOTE — Telephone Encounter (Signed)
New Message:     Daughter says patient needs to be seen today if possible. She says he just seems off, he seems confused at times.. She says when he talk sometimes you can understand him and sometimes you can not . He seems to be staggering at times, she says something is wrong. He also says he has a pain in his shoulder.

## 2022-10-08 NOTE — Telephone Encounter (Signed)
I am very sorry he is sick. Since he is at the ER now, it is not appropriate for me to be sending in prescriptions. The ER staff will need to determine what is going on and they will  either admit him, or send him home with what they think is appropriate based on their testing. I don't have the results of their tests.

## 2022-10-08 NOTE — ED Notes (Signed)
ED TO INPATIENT HANDOFF REPORT  ED Nurse Name and Phone #: [phone]  S Name/Age/Gender [name] [name] 77 y.o. male Room/Bed: 025C/025C  Code Status   Code Status: Full Code  Home/SNF/Other Home Patient oriented to: self, place, time, and situation Is this baseline? Yes   Triage Complete: Triage complete  Chief Complaint AMS (altered mental status) [R41.82]  Triage Note Pt arrives via POV. Pt reports for the past few days he has had increased sob, cough, some confusion, left shoulder pain, and difficulty ambulating. He reports for about 1 week his left leg has been weaker than the right. Pt currently AxOx4.    Allergies Allergies  Allergen Reactions   Penicillins Anaphylaxis   Codeine Rash    Level of Care/Admitting Diagnosis ED Disposition     ED Disposition  Admit   Condition  --   Comment  Hospital Area: [facility] [[id number]]  Level of Care: Telemetry Medical [104]  May place patient in observation at [facility] or [facility] if equivalent level of care is available:: No  Covid Evaluation: Asymptomatic - no recent exposure (last 10 days) testing not required  Diagnosis: AMS (altered mental status) [[id number]]  Admitting Physician: Alessandra Bevels [[id number]]  Attending Physician: Alessandra Bevels [[id number]]          B Medical/Surgery History Past Medical History:  Diagnosis Date   Arthritis    Bradycardia    pacemaker - Medtronic Adapta #[id number] - December 22, 2005   Carpal tunnel syndrome on both sides    Carpal tunnel syndrome, bilateral    Chronic back pain    Chronic kidney disease    COPD (chronic obstructive pulmonary disease) (HCC)    Depression    Diabetes mellitus    type II controled with diet   GERD (gastroesophageal reflux disease)    Headache(784.0)    Hyperlipidemia    Hypertension    Hypertensive retinopathy    Insomnia    Neuromuscular disorder (HCC)    peripheral neuropathy BLE   Pneumonia    Presence of  permanent cardiac pacemaker    Wears glasses    Past Surgical History:  Procedure Laterality Date   BACK SURGERY     CARPAL TUNNEL RELEASE Right 03/24/2017   Procedure: Right CARPAL TUNNEL RELEASE;  Surgeon: Maeola Harman, MD;  Location: [facility] OR;  Service: Neurosurgery;  Laterality: Right;  Right CARPAL TUNNEL RELEASE   CATARACT EXTRACTION     EP IMPLANTABLE DEVICE N/A 06/27/2015   Procedure:  PPM Generator Changeout;  Surgeon: Duke Salvia, MD;  Location: [facility] INVASIVE CV LAB;  Service: Cardiovascular;  Laterality: N/A;   EYE SURGERY     HEMORRHOID SURGERY     INSERT / REPLACE / REMOVE PACEMAKER     MULTIPLE TOOTH EXTRACTIONS     PACEMAKER INSERTION  ?2007   TOTAL HIP ARTHROPLASTY Right 01/25/2013   Procedure: TOTAL HIP ARTHROPLASTY ANTERIOR APPROACH;  Surgeon: Velna Ochs, MD;  Location: [facility] OR;  Service: Orthopedics;  Laterality: Right;   ULNAR NERVE TRANSPOSITION Right 03/24/2017   Procedure: Right Ulnar nerve release;  Surgeon: Maeola Harman, MD;  Location: [facility] OR;  Service: Neurosurgery;  Laterality: Right;  Right Ulnar nerve release     A IV Location/Drains/Wounds Patient Lines/Drains/Airways Status     Active Line/Drains/Airways     Name Placement date Placement time Site Days   Peripheral IV 10/08/22 20 G Anterior;Distal;Left;Upper Arm 10/08/22  1041  Arm  less than 1  Intake/Output Last 24 hours  Intake/Output Summary (Last 24 hours) at 10/08/2022 1809 Last data filed at 10/08/2022 1600 Gross per 24 hour  Intake 500 ml  Output --  Net 500 ml    Labs/Imaging Results for orders placed or performed during the hospital encounter of 10/08/22 (from the past 48 hour(s))  Resp panel by RT-PCR (RSV, Flu A&B, Covid) Anterior Nasal Swab     Status: None   Collection Time: 10/08/22 10:33 AM   Specimen: Anterior Nasal Swab  Result Value Ref Range   SARS Coronavirus 2 by RT PCR NEGATIVE NEGATIVE   Influenza A by PCR NEGATIVE NEGATIVE   Influenza B by PCR  NEGATIVE NEGATIVE    Comment: (NOTE) The Xpert Xpress SARS-CoV-2/FLU/RSV plus assay is intended as an aid in the diagnosis of influenza from Nasopharyngeal swab specimens and should not be used as a sole basis for treatment. Nasal washings and aspirates are unacceptable for Xpert Xpress SARS-CoV-2/FLU/RSV testing.  Fact Sheet for Patients: BloggerCourse.com  Fact Sheet for Healthcare Providers: SeriousBroker.it  This test is not yet approved or cleared by the [place] FDA and has been authorized for detection and/or diagnosis of SARS-CoV-2 by FDA under an Emergency Use Authorization (EUA). This EUA will remain in effect (meaning this test can be used) for the duration of the COVID-19 declaration under Section 564(b)(1) of the Act, 21 U.S.C. section 360bbb-3(b)(1), unless the authorization is terminated or revoked.     Resp Syncytial Virus by PCR NEGATIVE NEGATIVE    Comment: (NOTE) Fact Sheet for Patients: BloggerCourse.com  Fact Sheet for Healthcare Providers: SeriousBroker.it  This test is not yet approved or cleared by the [place] FDA and has been authorized for detection and/or diagnosis of SARS-CoV-2 by FDA under an Emergency Use Authorization (EUA). This EUA will remain in effect (meaning this test can be used) for the duration of the COVID-19 declaration under Section 564(b)(1) of the Act, 21 U.S.C. section 360bbb-3(b)(1), unless the authorization is terminated or revoked.  Performed at [facility], [address], [place], [place] [address]   Comprehensive metabolic panel     Status: Abnormal   Collection Time: 10/08/22 10:40 AM  Result Value Ref Range   Sodium 124 (L) 135 - 145 mmol/L   Potassium 3.4 (L) 3.5 - 5.1 mmol/L   Chloride 93 (L) 98 - 111 mmol/L   CO2 19 (L) 22 - 32 mmol/L   Glucose, Bld 148 (H) 70 - 99 mg/dL    Comment: Glucose  reference range applies only to samples taken after fasting for at least 8 hours.   BUN 18 8 - 23 mg/dL   Creatinine, Ser 1.91 0.61 - 1.24 mg/dL   Calcium 8.2 (L) 8.9 - 10.3 mg/dL   Total Protein 6.6 6.5 - 8.1 g/dL   Albumin 3.4 (L) 3.5 - 5.0 g/dL   AST 29 15 - 41 U/L   ALT 22 0 - 44 U/L   Alkaline Phosphatase 54 38 - 126 U/L   Total Bilirubin 0.9 0.3 - 1.2 mg/dL   GFR, Estimated >47 >82 mL/min    Comment: (NOTE) Calculated using the CKD-EPI Creatinine Equation (2021)    Anion gap 12 5 - 15    Comment: Performed at [facility], [address], [place], [place] [address]  CBC with Differential/Platelet     Status: Abnormal   Collection Time: 10/08/22 10:40 AM  Result Value Ref Range   WBC 10.0 4.0 - 10.5 K/uL  RBC 4.85 4.22 - 5.81 MIL/uL   Hemoglobin 15.3 13.0 - 17.0 g/dL   HCT 16.1 09.6 - 04.5 %   MCV 91.8 80.0 - 100.0 fL   MCH 31.5 26.0 - 34.0 pg   MCHC 34.4 30.0 - 36.0 g/dL   RDW 40.9 81.1 - 91.4 %   Platelets 206 150 - 400 K/uL   nRBC 0.0 0.0 - 0.2 %   Neutrophils Relative % 65 %   Neutro Abs 6.4 1.7 - 7.7 K/uL   Lymphocytes Relative 21 %   Lymphs Abs 2.1 0.7 - 4.0 K/uL   Monocytes Relative 13 %   Monocytes Absolute 1.3 (H) 0.1 - 1.0 K/uL   Eosinophils Relative 1 %   Eosinophils Absolute 0.1 0.0 - 0.5 K/uL   Basophils Relative 0 %   Basophils Absolute 0.0 0.0 - 0.1 K/uL   Immature Granulocytes 0 %   Abs Immature Granulocytes 0.04 0.00 - 0.07 K/uL    Comment: Performed at [facility], [address], [place], [place] [address]  I-Stat venous blood gas, ED     Status: Abnormal   Collection Time: 10/08/22 11:07 AM  Result Value Ref Range   pH, Ven 7.403 7.25 - 7.43   pCO2, Ven 41.1 (L) 44 - 60 mmHg   pO2, Ven 145 (H) 32 - 45 mmHg   Bicarbonate 25.7 20.0 - 28.0 mmol/L   TCO2 27 22 - 32 mmol/L   O2 Saturation 99 %   Acid-Base Excess 1.0 0.0 - 2.0 mmol/L   Sodium 126 (L) 135 - 145 mmol/L   Potassium 3.5 3.5 - 5.1 mmol/L   Calcium, Ion 1.02 (L) 1.15 -  1.40 mmol/L   HCT 47.0 39.0 - 52.0 %   Hemoglobin 16.0 13.0 - 17.0 g/dL   Sample type VENOUS   CBG monitoring, ED     Status: Abnormal   Collection Time: 10/08/22 11:24 AM  Result Value Ref Range   Glucose-Capillary 133 (H) 70 - 99 mg/dL    Comment: Glucose reference range applies only to samples taken after fasting for at least 8 hours.  Rapid urine drug screen (hospital performed)     Status: None   Collection Time: 10/08/22  2:17 PM  Result Value Ref Range   Opiates NONE DETECTED NONE DETECTED   Cocaine NONE DETECTED NONE DETECTED   Benzodiazepines NONE DETECTED NONE DETECTED   Amphetamines NONE DETECTED NONE DETECTED   Tetrahydrocannabinol NONE DETECTED NONE DETECTED   Barbiturates NONE DETECTED NONE DETECTED    Comment: (NOTE) DRUG SCREEN FOR MEDICAL PURPOSES ONLY.  IF CONFIRMATION IS NEEDED FOR ANY PURPOSE, NOTIFY LAB WITHIN 5 DAYS.  LOWEST DETECTABLE LIMITS FOR URINE DRUG SCREEN Drug Class                     Cutoff (ng/mL) Amphetamine and metabolites    1000 Barbiturate and metabolites    200 Benzodiazepine                 200 Opiates and metabolites        300 Cocaine and metabolites        300 THC                            50 Performed at [facility], [address], [place], [place] [address]   Urinalysis, Routine w reflex microscopic -Urine, Clean Catch     Status: Abnormal  Collection Time: 10/08/22  2:17 PM  Result Value Ref Range   Color, Urine YELLOW YELLOW   APPearance CLEAR CLEAR   Specific Gravity, Urine 1.015 1.005 - 1.030   pH 6.0 5.0 - 8.0   Glucose, UA >=500 (A) NEGATIVE mg/dL   Hgb urine dipstick NEGATIVE NEGATIVE   Bilirubin Urine NEGATIVE NEGATIVE   Ketones, ur NEGATIVE NEGATIVE mg/dL   Protein, ur NEGATIVE NEGATIVE mg/dL   Nitrite NEGATIVE NEGATIVE   Leukocytes,Ua NEGATIVE NEGATIVE   RBC / HPF 0-5 0 - 5 RBC/hpf   WBC, UA 0-5 0 - 5 WBC/hpf   Bacteria, UA NONE SEEN NONE SEEN   Squamous Epithelial / HPF 0-5 0 - 5 /HPF    Comment:  Performed at [facility], [address], [place], [place] [address]   CT HEAD WO CONTRAST  Result Date: 10/08/2022 CLINICAL DATA:  Altered mental status EXAM: CT HEAD WITHOUT CONTRAST TECHNIQUE: Contiguous axial images were obtained from the base of the skull through the vertex without intravenous contrast. RADIATION DOSE REDUCTION: This exam was performed according to the departmental dose-optimization program which includes automated exposure control, adjustment of the mA and/or kV according to patient size and/or use of iterative reconstruction technique. COMPARISON:  CT head 09/29/2022. FINDINGS: Brain: There is no evidence of acute intracranial hemorrhage, extra-axial fluid collection, or acute infarct. Parenchymal volume is normal. The ventricles are normal in size. Gray-white differentiation is preserved. Patchy hypodensity in the supratentorial white matter is nonspecific but likely reflects sequela of chronic small-vessel ischemic change. The pituitary and suprasellar region are normal. There is no mass lesion. There is no mass effect or midline shift. Vascular: No hyperdense vessel or unexpected calcification. Skull: Normal. Negative for fracture or focal lesion. Sinuses/Orbits: There is mild mucosal thickening in the paranasal sinuses. Bilateral lens implants are in place. The globes and orbits are otherwise unremarkable. Other: None. IMPRESSION: Stable noncontrast head CT with no acute intracranial pathology. Electronically Signed   By: Lesia Hausen M.D.   On: 10/08/2022 12:47   DG Chest 2 View  Result Date: 10/08/2022 CLINICAL DATA:  Cough and shortness of breath EXAM: CHEST - 2 VIEW COMPARISON:  09/29/2022 FINDINGS: Left upper chest pacemaker battery pack with leads along the right side of the heart. Stable cardiopericardial silhouette. Tortuous aorta. No pneumothorax, effusion or edema. Minimal linear opacity left lung base likely scar or atelectasis. Air-fluid level along the stomach  beneath the left hemidiaphragm. Films are under penetrated IMPRESSION: Under penetrated radiographs. Left upper chest pacemaker. Minimal opacity left lung base likely scar or atelectasis Electronically Signed   By: Karen Kays M.D.   On: 10/08/2022 11:11    Pending Labs Unresulted Labs (From admission, onward)     Start     Ordered   10/15/22 0500  Creatinine, serum  (enoxaparin (LOVENOX)    CrCl >/= 30 ml/min)  Weekly,   R     Comments: while on enoxaparin therapy    10/08/22 1748   10/09/22 0500  Basic metabolic panel  Tomorrow morning,   R        10/08/22 1748   10/08/22 1755  Na and K (sodium & potassium), rand urine  Once,   R        10/08/22 1754   10/08/22 1755  Osmolality, urine  Once,   R        10/08/22 1754   10/08/22 1755  Uric acid  Once,   R  10/08/22 1754   10/08/22 1755  Creatinine, urine, random  Once,   R        10/08/22 1754   10/08/22 1739  CBC  (enoxaparin (LOVENOX)    CrCl >/= 30 ml/min)  Once,   R       Comments: Baseline for enoxaparin therapy IF NOT ALREADY DRAWN.  Notify MD if PLT < 100 K.    10/08/22 1748   10/08/22 1739  Creatinine, serum  (enoxaparin (LOVENOX)    CrCl >/= 30 ml/min)  Once,   R       Comments: Baseline for enoxaparin therapy IF NOT ALREADY DRAWN.    10/08/22 1748   10/08/22 1738  Respiratory (~20 pathogens) panel by PCR  (COPD / Pneumonia / Cellulitis / Lower Extremity Wound)  Once,   R        10/08/22 1748   10/08/22 1032  Blood gas, venous  Once,   R        10/08/22 1033   10/08/22 1032  Ethanol  Once,   URGENT        10/08/22 1033            Vitals/Pain Today's Vitals   10/08/22 1330 10/08/22 1421 10/08/22 1430 10/08/22 1500  BP: (!) 141/83  (!) 125/108 131/68  Pulse: 67  64 82  Resp: (!) 21  19 (!) 21  Temp:  97.6 F (36.4 C)    TempSrc:  Oral    SpO2: 99%  100% 98%  Weight:      Height:      PainSc:        Isolation Precautions Droplet precaution  Medications Medications  albuterol (PROVENTIL) (2.5  MG/3ML) 0.083% nebulizer solution (10 mg/hr Nebulization New Bag/Given 10/08/22 1422)  acetaminophen (TYLENOL) tablet 650 mg (has no administration in time range)  oxyCODONE (Oxy IR/ROXICODONE) immediate release tablet 5 mg (has no administration in time range)  atorvastatin (LIPITOR) tablet 80 mg (has no administration in time range)  diltiazem (CARDIZEM SR) 12 hr capsule 120 mg (has no administration in time range)  ezetimibe (ZETIA) tablet 10 mg (has no administration in time range)  flecainide (TAMBOCOR) tablet 100 mg (has no administration in time range)  isosorbide mononitrate (IMDUR) 24 hr tablet 60 mg (has no administration in time range)  metoprolol tartrate (LOPRESSOR) tablet 50 mg (has no administration in time range)  nitroGLYCERIN (NITROSTAT) SL tablet 0.4 mg (has no administration in time range)  irbesartan (AVAPRO) tablet 37.5 mg (has no administration in time range)  empagliflozin (JARDIANCE) tablet 10 mg (has no administration in time range)  glimepiride (AMARYL) tablet 2 mg (has no administration in time range)  docusate sodium ([name]) capsule 100 mg (has no administration in time range)  famotidine (PEPCID) tablet 20 mg (has no administration in time range)  pantoprazole (PROTONIX) EC tablet 40 mg (has no administration in time range)  cyclobenzaprine (FLEXERIL) tablet 10 mg (has no administration in time range)  gabapentin (NEURONTIN) capsule 300 mg (has no administration in time range)  ipratropium-albuterol (DUONEB) 0.5-2.5 (3) MG/3ML nebulizer solution 3 mL (has no administration in time range)  ipratropium-albuterol (DUONEB) 0.5-2.5 (3) MG/3ML nebulizer solution 3 mL (has no administration in time range)  levocetirizine (XYZAL) tablet 5 mg (has no administration in time range)  montelukast (SINGULAIR) tablet 10 mg (has no administration in time range)  predniSONE (DELTASONE) tablet 30 mg (has no administration in time range)  enoxaparin (LOVENOX) injection 40 mg (has no  administration in time  range)  aspirin EC tablet 81 mg (has no administration in time range)  0.9 %  sodium chloride infusion (has no administration in time range)  predniSONE (DELTASONE) tablet 40 mg (40 mg Oral Given 10/08/22 1105)  ipratropium-albuterol (DUONEB) 0.5-2.5 (3) MG/3ML nebulizer solution 3 mL (3 mLs Nebulization Given 10/08/22 1107)  ipratropium-albuterol (DUONEB) 0.5-2.5 (3) MG/3ML nebulizer solution 3 mL (3 mLs Nebulization Given 10/08/22 1321)  lactated ringers bolus 500 mL (0 mLs Intravenous Stopped 10/08/22 1600)  albuterol (PROVENTIL) (2.5 MG/3ML) 0.083% nebulizer solution 5 mg (5 mg Nebulization Given 10/08/22 1608)    Mobility walks with device     Focused Assessments Cardiac Assessment Handoff:  Cardiac Rhythm: Normal sinus rhythm No results found for: "CKTOTAL", "CKMB", "CKMBINDEX", "TROPONINI" No results found for: "DDIMER" Does the Patient currently have chest pain? No   , Neuro Assessment Handoff:  Intermittent confusion  Cardiac Rhythm: Normal sinus rhythm        , Pulmonary Assessment Handoff:  Lung sounds: L Breath Sounds: Expiratory wheezes R Breath Sounds: Expiratory wheezes O2 Device: Room Air     R Recommendations: See Admitting Provider Note  Report given to:   Additional Notes:

## 2022-10-08 NOTE — Telephone Encounter (Signed)
Call send straight to triage. Patient's daughter is calling because patient has been staggering, not making sense, and having shoulder pain. Informed patient's daughter of stroke symptoms and when to seek medical help. She stated that patient's BP has been fine and this has been going on since [facility]. She also stated that patient is not having a stroke. Asked patient's daughter if patient has had a fall. She stated she did not think so. Patient also had neck pain and went to the ER last Monday. Informed patient's daughter that this sounds more neurological than cardiac related. Encouraged her to call patient's PCP or go to urgent care to have patient evaluated. Patient sounds like he is confused at times and might have altered mental status. Will send to Dr. Izora Ribas for advisement.

## 2022-10-08 NOTE — ED Provider Notes (Signed)
[place] EMERGENCY DEPARTMENT AT [facility] Provider Note   CSN: [id number] Arrival date & time: 10/08/22  1011     History  Chief Complaint  Patient presents with   Shortness of Breath   Cough   Weakness    [name] [name] is a 77 y.o. male.  77 year old male with a history of COPD, HFpEF, hypertension, hyperlipidemia, diabetes, medtronic PPM, and CKD who presents emergency department with cough and shortness of breath.  History obtained per the patient and his daughter who states that on Friday he started having a cough.  Says it is dry.  Has been having some shortness of breath as well.  Daughter noticed that he has been confused and was not sure when some of his appointments were which is abnormal for him.  He lives at home with his spouse and is typically very independent.  Reports some generalized weakness as well.  Did give himself some prednisone recently.  No other recent medication changes.  Was seen in the emergency department on 09/29/2022 where he was having some neck pain and had a CT angio head and neck that was unremarkable with otherwise reassuring evaluation.  Does not currently smoke.       Home Medications Prior to Admission medications   Medication Sig Start Date End Date Taking? Authorizing Provider  acetaminophen (TYLENOL) 650 MG CR tablet Take 1,300 mg daily as needed by mouth for pain.     [provider]  aspirin 81 MG tablet Take 81 mg by mouth daily.    [provider]  atorvastatin (LIPITOR) 80 MG tablet TAKE 1 TABLET BY MOUTH ONCE  DAILY 07/09/22   Chandrasekhar, Mahesh A, MD  CALCIUM-MAGNESIUM-ZINC PO Take 1 capsule by mouth daily.    [provider]  cyclobenzaprine (FLEXERIL) 10 MG tablet Take 1 tablet (10 mg total) by mouth 2 (two) times daily as needed for muscle spasms. 09/29/22   Alvira Monday, MD  diltiazem (CARDIZEM SR) 120 MG 12 hr capsule Take 1 capsule (120 mg total) by mouth daily. 04/15/22    Chandrasekhar, [place] Dragon A, MD  docusate sodium (COLACE) 100 MG capsule Take 100 mg by mouth 2 (two) times daily.    [provider]  doxycycline (VIBRA-TABS) 100 MG tablet Take 1 tablet (100 mg total) by mouth 2 (two) times daily. 08/19/22   Parrett, Virgel Bouquet, NP  empagliflozin (JARDIANCE) 10 MG TABS tablet Take 1 tablet (10 mg total) by mouth daily before breakfast. 04/09/22   Chandrasekhar, Mahesh A, MD  ezetimibe (ZETIA) 10 MG tablet Take 1 tablet by mouth once daily 03/11/22   Riley Lam A, MD  famotidine (PEPCID) 20 MG tablet TAKE ONE TABLET BY MOUTH ONCE DAILY AT BEDTIME 06/07/14   Nyoka Cowden, MD  flecainide (TAMBOCOR) 100 MG tablet Take 1 tablet (100 mg total) by mouth 2 (two) times daily. 05/07/22   Sheilah Pigeon, PA-C  furosemide (LASIX) 20 MG tablet Take 1 tablet (20 mg total) by mouth daily. 05/07/22   Sheilah Pigeon, PA-C  gabapentin (NEURONTIN) 300 MG capsule Take 300 mg by mouth 2 (two) times daily.    [provider]  glimepiride (AMARYL) 1 MG tablet Take 1 tablet by mouth daily. 07/27/14   [provider]  guaiFENesin (MUCINEX) 600 MG 12 hr tablet Take 1 tablet (600 mg total) by mouth 2 (two) times daily. 11/16/17   Jetty Duhamel D, MD  ipratropium-albuterol (DUONEB) 0.5-2.5 (3) MG/3ML SOLN Inhale 3 mLs into  the lungs every 6 (six) hours as needed (wheezing or shortness of breath). 10/02/22   Waymon Budge, MD  isosorbide mononitrate (IMDUR) 60 MG 24 hr tablet Take 1 tablet (60 mg total) by mouth daily. 07/16/22   Christell Constant, MD  Lancets (ONETOUCH DELICA PLUS Melia) MISC daily at 6 (six) AM. 04/03/22   [provider]  levocetirizine (XYZAL) 5 MG tablet Take 5 mg at bedtime by mouth.  07/25/14   [provider]  lidocaine (LIDODERM) 5 % Place 1 patch onto the skin daily. Remove & Discard patch within 12 hours or as directed by MD 09/29/22   Alvira Monday, MD  metoprolol tartrate (LOPRESSOR) 50 MG tablet Take 1  tablet (50 mg total) by mouth 2 (two) times daily. 02/13/22   Duke Salvia, MD  montelukast (SINGULAIR) 10 MG tablet Take 10 mg at bedtime by mouth.  07/20/14   [provider]  Multiple Vitamin (MULTIVITAMIN WITH MINERALS) TABS tablet Take 1 tablet by mouth daily.    [provider]  nitroGLYCERIN (NITROSTAT) 0.4 MG SL tablet DISSOLVE ONE TABLET UNDER THE TONGUE EVERY 5 MINUTES AS NEEDED FOR CHEST PAIN.  DO NOT EXCEED A TOTAL OF 3 DOSES IN 15 MINUTES 02/04/22   Chandrasekhar, Mahesh A, MD  olmesartan (BENICAR) 20 MG tablet Take 10 mg by mouth daily.    [provider]  [facility] VERIO test strip 1 each by Other route daily. 04/04/22   [provider]  oxyCODONE (ROXICODONE) 5 MG immediate release tablet Take 1 tablet (5 mg total) by mouth every 4 (four) hours as needed for severe pain. 09/29/22   Alvira Monday, MD  pantoprazole (PROTONIX) 40 MG tablet TAKE ONE TABLET BY MOUTH ONCE DAILY TAKE  30-60  MINUTES  BEFORE  FIRST  MEAL  OF  THE  DAY 06/07/14   Nyoka Cowden, MD  predniSONE (DELTASONE) 10 MG tablet 4 tabs for 2 days, then 3 tabs for 2 days, 2 tabs for 2 days, then 1 tab  daily 08/19/22   Parrett, Virgel Bouquet, NP  PROAIR HFA 108 (90 BASE) MCG/ACT inhaler Inhale 2 puffs into the lungs 4 (four) times daily as needed for wheezing or shortness of breath. 07/27/14   [provider]  Respiratory Therapy Supplies (FLUTTER) DEVI Blow through 4 times per set, 3 sets per day when needed to clear lungs 02/11/16   Waymon Budge, MD  TRELEGY ELLIPTA 100-62.5-25 MCG/ACT AEPB USE 1 INHALATION BY MOUTH DAILY 02/13/22   Waymon Budge, MD      Allergies    Penicillins and Codeine    Review of Systems   Review of Systems  Physical Exam Updated Vital Signs BP 131/68   Pulse 82   Temp 97.6 F (36.4 C) (Oral)   Resp (!) 21   Ht 6' (1.829 m)   Wt 102.1 kg   SpO2 98%   BMI 30.52 kg/m  Physical Exam Vitals and nursing note reviewed.  Constitutional:       General: He is not in acute distress.    Appearance: He is well-developed.     Comments: Speaking in short sentences.  Does have some minor accessory muscle use.  HENT:     Head: Normocephalic and atraumatic.     Right Ear: External ear normal.     Left Ear: External ear normal.     Nose: Nose normal.  Eyes:     Extraocular Movements: Extraocular movements intact.  Conjunctiva/sclera: Conjunctivae normal.     Pupils: Pupils are equal, round, and reactive to light.  Cardiovascular:     Rate and Rhythm: Normal rate and regular rhythm.     Heart sounds: Normal heart sounds.  Pulmonary:     Effort: Pulmonary effort is normal. No respiratory distress.     Breath sounds: Wheezing and rhonchi present.  Musculoskeletal:     Cervical back: Normal range of motion and neck supple.     Right lower leg: No edema.     Left lower leg: No edema.  Skin:    General: Skin is warm and dry.  Neurological:     Mental Status: He is alert.     Comments: MENTAL STATUS: AAOx3 CRANIAL NERVES: II: Pupils equal and reactive 4 mm BL, no RAPD, no VF deficits III, IV, VI: EOM intact, no gaze preference or deviation, no nystagmus. V: normal sensation to light touch in V1, V2, and V3 segments bilaterally VII: no facial weakness or asymmetry, no nasolabial fold flattening VIII: normal hearing to speech and finger friction IX, X: normal palatal elevation, no uvular deviation XI: 5/5 head turn and 5/5 shoulder shrug bilaterally XII: midline tongue protrusion MOTOR: 5/5 strength in R shoulder flexion, elbow flexion and extension, and grip strength. 5/5 strength in L shoulder flexion, elbow flexion and extension, and grip strength.  5/5 strength in R hip and knee flexion, knee extension, ankle plantar and dorsiflexion. 5/5 strength in L hip and knee flexion, knee extension, ankle plantar and dorsiflexion. SENSORY: Normal sensation to light touch in all extremities COORD: Normal finger to nose and heel to shin,  no tremor, no dysmetria STATION: normal stance, no truncal ataxia GAIT: Normal   Psychiatric:        Mood and Affect: Mood normal.        Behavior: Behavior normal.     ED Results / Procedures / Treatments   Labs (all labs ordered are listed, but only abnormal results are displayed) Labs Reviewed  COMPREHENSIVE METABOLIC PANEL - Abnormal; Notable for the following components:      Result Value   Sodium 124 (*)    Potassium 3.4 (*)    Chloride 93 (*)    CO2 19 (*)    Glucose, Bld 148 (*)    Calcium 8.2 (*)    Albumin 3.4 (*)    All other components within normal limits  CBC WITH DIFFERENTIAL/PLATELET - Abnormal; Notable for the following components:   Monocytes Absolute 1.3 (*)    All other components within normal limits  URINALYSIS, ROUTINE W REFLEX MICROSCOPIC - Abnormal; Notable for the following components:   Glucose, UA >=500 (*)    All other components within normal limits  CBG MONITORING, ED - Abnormal; Notable for the following components:   Glucose-Capillary 133 (*)    All other components within normal limits  I-STAT VENOUS BLOOD GAS, ED - Abnormal; Notable for the following components:   pCO2, Ven 41.1 (*)    pO2, Ven 145 (*)    Sodium 126 (*)    Calcium, Ion 1.02 (*)    All other components within normal limits  RESP PANEL BY RT-PCR (RSV, FLU A&B, COVID)  RVPGX2  RAPID URINE DRUG SCREEN, HOSP PERFORMED  BLOOD GAS, VENOUS  ETHANOL  CBG MONITORING, ED    EKG EKG Interpretation  Date/Time:  Wednesday October 08 2022 10:22:37 EDT Ventricular Rate:  63 PR Interval:  234 QRS Duration: 116 QT Interval:  569 QTC Calculation: 583  R Axis:   171 Text Interpretation: Sinus rhythm Prolonged PR interval LAD, consider left anterior fascicular block Anteroseptal infarct, age indeterminate No significant change since last tracing Confirmed by Vonita Moss [phone]) on 10/08/2022 10:35:10 AM  Radiology CT HEAD WO CONTRAST  Result Date: 10/08/2022 CLINICAL DATA:   Altered mental status EXAM: CT HEAD WITHOUT CONTRAST TECHNIQUE: Contiguous axial images were obtained from the base of the skull through the vertex without intravenous contrast. RADIATION DOSE REDUCTION: This exam was performed according to the departmental dose-optimization program which includes automated exposure control, adjustment of the mA and/or kV according to patient size and/or use of iterative reconstruction technique. COMPARISON:  CT head 09/29/2022. FINDINGS: Brain: There is no evidence of acute intracranial hemorrhage, extra-axial fluid collection, or acute infarct. Parenchymal volume is normal. The ventricles are normal in size. Gray-white differentiation is preserved. Patchy hypodensity in the supratentorial white matter is nonspecific but likely reflects sequela of chronic small-vessel ischemic change. The pituitary and suprasellar region are normal. There is no mass lesion. There is no mass effect or midline shift. Vascular: No hyperdense vessel or unexpected calcification. Skull: Normal. Negative for fracture or focal lesion. Sinuses/Orbits: There is mild mucosal thickening in the paranasal sinuses. Bilateral lens implants are in place. The globes and orbits are otherwise unremarkable. Other: None. IMPRESSION: Stable noncontrast head CT with no acute intracranial pathology. Electronically Signed   By: Lesia Hausen M.D.   On: 10/08/2022 12:47   DG Chest 2 View  Result Date: 10/08/2022 CLINICAL DATA:  Cough and shortness of breath EXAM: CHEST - 2 VIEW COMPARISON:  09/29/2022 FINDINGS: Left upper chest pacemaker battery pack with leads along the right side of the heart. Stable cardiopericardial silhouette. Tortuous aorta. No pneumothorax, effusion or edema. Minimal linear opacity left lung base likely scar or atelectasis. Air-fluid level along the stomach beneath the left hemidiaphragm. Films are under penetrated IMPRESSION: Under penetrated radiographs. Left upper chest pacemaker. Minimal opacity  left lung base likely scar or atelectasis Electronically Signed   By: Karen Kays M.D.   On: 10/08/2022 11:11    Procedures Procedures    Medications Ordered in ED Medications  albuterol (PROVENTIL) (2.5 MG/3ML) 0.083% nebulizer solution (10 mg/hr Nebulization New Bag/Given 10/08/22 1422)  predniSONE (DELTASONE) tablet 40 mg (40 mg Oral Given 10/08/22 1105)  ipratropium-albuterol (DUONEB) 0.5-2.5 (3) MG/3ML nebulizer solution 3 mL (3 mLs Nebulization Given 10/08/22 1107)  ipratropium-albuterol (DUONEB) 0.5-2.5 (3) MG/3ML nebulizer solution 3 mL (3 mLs Nebulization Given 10/08/22 1321)  lactated ringers bolus 500 mL (0 mLs Intravenous Stopped 10/08/22 1600)  albuterol (PROVENTIL) (2.5 MG/3ML) 0.083% nebulizer solution 5 mg (5 mg Nebulization Given 10/08/22 1608)    ED Course/ Medical Decision Making/ A&P Clinical Course as of 10/08/22 1703  Wed Oct 08, 2022  1543 Dr Lajuana Ripple to admit the patient. [RP]    Clinical Course User Index [RP] Rondel Baton, MD                             Medical Decision Making Amount and/or Complexity of Data Reviewed Labs: ordered. Radiology: ordered.  Risk Prescription drug management. Decision regarding hospitalization.   MEAD CHEA is a 77 y.o. male with comorbidities that complicate the patient evaluation including COPD, HFpEF, hypertension, hyperlipidemia, diabetes, medtronic PPM, and CKD who presents emergency department with cough and shortness of breath and confusion  Initial Ddx:  COPD exacerbation, hypercarbia, delirium, infection, UTI, pneumonia, ICH, brain mass  MDM:  Feel the patient is likely having a COPD exacerbation at this time.  Will go ahead and give him steroids and nebulizers.  Do not feel the patient currently requires BiPAP.  Will check a blood gas with his confusion though does not appear drowsy at this time.  Family also reports some difficulty walking but I do not notice any ataxia on exam that would be concerning for  stroke.  With his altered mental status that they have been reporting will obtain a CT of the head without contrast as well as urinalysis to assess for any common causes of confusion.  Also obtain a COVID and flu and a chest x-ray to evaluate for pulmonary infection.  Plan:  Labs VBG Urinalysis CT head Chest x-ray  ED Summary/Re-evaluation:  Patient above workup did not reveal any evidence of infection.  Was not hypercarbic on his blood gas.  His sodium was noted to be 124 which is decreased from 134 several days ago.  Suspect his COPD and some slight dehydration as a cause of this we will give him some IV fluids.  Patient did transiently require continuous albuterol and multiple nebs.  Does live at home by himself and is typically pretty active so we will go ahead and admit to medicine for COPD exacerbation since he and his family are concerned about him going home and he is requiring frequent nebulizer treatments and appears to be somewhat confused.  This patient presents to the ED for concern of complaints listed in HPI, this involves an extensive number of treatment options, and is a complaint that carries with it a high risk of complications and morbidity. Disposition including potential need for admission considered.   Dispo: Admit to Floor  Additional history obtained from family Records reviewed Outpatient Clinic Notes The following labs were independently interpreted: Chemistry and show  hyponatremia I independently reviewed the following imaging with scope of interpretation limited to determining acute life threatening conditions related to emergency care: Chest x-ray and agree with the radiologist interpretation with the following exceptions: none I personally reviewed and interpreted the pt's EKG: see above for interpretation  I have reviewed the patients home medications and made adjustments as needed Consults: Hospitalist Social Determinants of health:   Elderly         Final Clinical Impression(s) / ED Diagnoses Final diagnoses:  COPD exacerbation (HCC)  Hyponatremia  Disorientation    Rx / DC Orders ED Discharge Orders     None      CRITICAL CARE Performed by: Rondel Baton   Total critical care time: 30 minutes  Critical care time was exclusive of separately billable procedures and treating other patients.  Critical care was necessary to treat or prevent imminent or life-threatening deterioration.  Critical care was time spent personally by me on the following activities: development of treatment plan with patient and/or surrogate as well as nursing, discussions with consultants, evaluation of patient's response to treatment, examination of patient, obtaining history from patient or surrogate, ordering and performing treatments and interventions, ordering and review of laboratory studies, ordering and review of radiographic studies, pulse oximetry and re-evaluation of patient's condition.    Rondel Baton, MD 10/08/22 [phone]

## 2022-10-08 NOTE — Telephone Encounter (Signed)
[name] daughter states patient is in the Emergency Room. Would like to discuss. Robin phone number is [phone].

## 2022-10-08 NOTE — ED Notes (Signed)
Pt ambulated to restroom with moderate difficulty with gait. MD notified.

## 2022-10-08 NOTE — ED Triage Notes (Addendum)
Pt arrives via POV. Pt reports for the past few days he has had increased sob, cough, some confusion, left shoulder pain, and difficulty ambulating. He reports for about 1 week his left leg has been weaker than the right. Pt currently AxOx4.

## 2022-10-08 NOTE — H&P (Addendum)
History and Physical    DOA: 10/08/2022  PCP: [name], MD  Patient coming from: home  Chief Complaint: AMS  HPI: [name] [name] is a 77 y.o. male with history h/o HTN, HLP, GERD, OSA, chronic back pain, diet-controlled diabetes, bradycardia s/p pacemaker, bilateral lower extremity arthritis/peripheral neuropathy, advanced COPD on chronic prednisone maintenance therapy brought in by family and concern for altered mental status.  Granddaughter at bedside and provides most of the history.  Granddaughter apparently [place] and is very involved in his care.  She reports patient presented to the ED on Memorial Day weekend with complaints of neck pain and underwent workup, was prescribed oxycodone as needed which he only took 1 dose since he has been home.  Patient apparently did have improvement in the neck pain and on May 30 was seen by his primary pulmonologist Dr. Maple Hudson for routine follow-up and was deemed to be at his baseline.  He was advised to continue taking home medication/breathing treatments as prescribed.  According to patient and granddaughter he takes prednisone 10 mg every other day as maintenance therapy.  He was previously on home O2 but this has been discontinued for 3 years now.  Patient does use BiPAP nightly for sleep apnea and largely compliant.  On Monday (June 3) patient noted to have ataxia and also appeared somewhat confused regarding his scheduled stress test appointment through his cardiologist.  Granddaughter states they were able to reorient patient that afternoon but again in the evening he kept questioning about his appointment and appeared confused.  He was again reoriented and yesterday morning patient's confusion reccured.  He also fell to the floor while trying to walk from the bed to his bathroom.  Patient reports losing balance and helping himself to the floor rather than abrupt fall.  Denies hitting his head.  His persistent confusion and  ataxia causing falls was concerning for the family who brought him for further workup. ED course: Patient awake alert oriented x 3 while in the ED.  He has been communicating appropriately.  However he was noted to be ataxic with severe gait imbalance when helped to the bathroom by nursing staff.  Per ED physician patient's breathing also appeared to be worse with wheezing and tachypnea prompting continuous nebulizer treatment for couple of hours.  Lab work revealed normal CBC, BMP does show hyponatremia (sodium 140s late last year, 134 on labs done during Memorial Day weekend visit and today at 124).  CT head unremarkable.  Hospitalist service consulted for further evaluation and management (admission was requested for COPD exacerbation but later granddaughter informed that their bigger concern is AMS and his breathing to them was at his baseline)   Review of Systems: As per HPI, otherwise review of systems negative.    Past Medical History:  Diagnosis Date   Arthritis    Bradycardia    pacemaker - Medtronic Adapta #[id number] - December 22, 2005   Carpal tunnel syndrome on both sides    Carpal tunnel syndrome, bilateral    Chronic back pain    Chronic kidney disease    COPD (chronic obstructive pulmonary disease) (HCC)    Depression    Diabetes mellitus    type II controled with diet   GERD (gastroesophageal reflux disease)    Headache(784.0)    Hyperlipidemia    Hypertension    Hypertensive retinopathy    Insomnia    Neuromuscular disorder (HCC)    peripheral neuropathy BLE  Pneumonia    Presence of permanent cardiac pacemaker    Wears glasses     Past Surgical History:  Procedure Laterality Date   BACK SURGERY     CARPAL TUNNEL RELEASE Right 03/24/2017   Procedure: Right CARPAL TUNNEL RELEASE;  Surgeon: Maeola Harman, MD;  Location: [facility] OR;  Service: Neurosurgery;  Laterality: Right;  Right CARPAL TUNNEL RELEASE   CATARACT EXTRACTION     EP IMPLANTABLE DEVICE N/A 06/27/2015    Procedure:  PPM Generator Changeout;  Surgeon: Duke Salvia, MD;  Location: [facility] INVASIVE CV LAB;  Service: Cardiovascular;  Laterality: N/A;   EYE SURGERY     HEMORRHOID SURGERY     INSERT / REPLACE / REMOVE PACEMAKER     MULTIPLE TOOTH EXTRACTIONS     PACEMAKER INSERTION  ?2007   TOTAL HIP ARTHROPLASTY Right 01/25/2013   Procedure: TOTAL HIP ARTHROPLASTY ANTERIOR APPROACH;  Surgeon: Velna Ochs, MD;  Location: [facility] OR;  Service: Orthopedics;  Laterality: Right;   ULNAR NERVE TRANSPOSITION Right 03/24/2017   Procedure: Right Ulnar nerve release;  Surgeon: Maeola Harman, MD;  Location: [facility] OR;  Service: Neurosurgery;  Laterality: Right;  Right Ulnar nerve release    Social history:  reports that he quit smoking about 49 years ago. His smoking use included cigarettes. He started smoking about 64 years ago. He has a 30.00 pack-year smoking history. He has never used smokeless tobacco. He reports that he does not drink alcohol and does not use drugs.   Allergies  Allergen Reactions   Penicillins Anaphylaxis   Codeine Rash    Family History  Problem Relation Age of Onset   Heart disease Mother    Asthma Maternal Aunt    Allergies Other        whole family      Prior to Admission medications   Medication Sig Start Date End Date Taking? Authorizing Provider  albuterol (VENTOLIN HFA) 108 (90 Base) MCG/ACT inhaler Inhale 2 puffs into the lungs every 6 (six) hours as needed for wheezing or shortness of breath.   Yes [provider]  aspirin 81 MG tablet Take 81 mg by mouth daily.   Yes [provider]  atorvastatin (LIPITOR) 80 MG tablet TAKE 1 TABLET BY MOUTH ONCE  DAILY 07/09/22  Yes Chandrasekhar, Mahesh A, MD  CALCIUM-MAGNESIUM-ZINC PO Take 1 tablet by mouth daily.   Yes [provider]  cyclobenzaprine (FLEXERIL) 10 MG tablet Take 1 tablet (10 mg total) by mouth 2 (two) times daily as needed for muscle spasms. 09/29/22  Yes Alvira Monday, MD  diltiazem  (CARDIZEM SR) 120 MG 12 hr capsule Take 1 capsule (120 mg total) by mouth daily. Patient taking differently: Take 120 mg by mouth every evening. 04/15/22  Yes Chandrasekhar, Mahesh A, MD  docusate sodium (COLACE) 100 MG capsule Take 100 mg by mouth daily.   Yes [provider]  empagliflozin (JARDIANCE) 10 MG TABS tablet Take 1 tablet (10 mg total) by mouth daily before breakfast. 04/09/22  Yes Chandrasekhar, Mahesh A, MD  ezetimibe (ZETIA) 10 MG tablet Take 1 tablet by mouth once daily Patient taking differently: Take 10 mg by mouth every evening. 03/11/22  Yes Chandrasekhar, Mahesh A, MD  famotidine (PEPCID) 20 MG tablet TAKE ONE TABLET BY MOUTH ONCE DAILY AT BEDTIME Patient taking differently: Take 20 mg by mouth at bedtime. 06/07/14  Yes Nyoka Cowden, MD  flecainide (TAMBOCOR) 100 MG tablet Take 1 tablet (100 mg total) by mouth 2 (two)  times daily. 05/07/22  Yes Sheilah Pigeon, PA-C  furosemide (LASIX) 40 MG tablet Take 40 mg by mouth daily.   Yes [provider]  gabapentin (NEURONTIN) 300 MG capsule Take 300 mg by mouth 2 (two) times daily.   Yes [provider]  glimepiride (AMARYL) 2 MG tablet Take 2 mg by mouth daily with breakfast.   Yes [provider]  Guaifenesin (MUCINEX MAXIMUM STRENGTH) 1200 MG TB12 Take 1,200 mg by mouth 2 (two) times daily.   Yes [provider]  ipratropium-albuterol (DUONEB) 0.5-2.5 (3) MG/3ML SOLN Inhale 3 mLs into the lungs every 6 (six) hours as needed (wheezing or shortness of breath). 10/02/22  Yes Young, Joni Fears D, MD  isosorbide mononitrate (IMDUR) 60 MG 24 hr tablet Take 1 tablet (60 mg total) by mouth daily. 07/16/22  Yes Chandrasekhar, Mahesh A, MD  levocetirizine (XYZAL) 5 MG tablet Take 5 mg at bedtime by mouth.  07/25/14  Yes [provider]  metoprolol tartrate (LOPRESSOR) 50 MG tablet Take 1 tablet (50 mg total) by mouth 2 (two) times daily. 02/13/22  Yes Duke Salvia, MD  montelukast  (SINGULAIR) 10 MG tablet Take 10 mg by mouth daily. 07/20/14  Yes [provider]  Multiple Vitamin (MULTIVITAMIN WITH MINERALS) TABS tablet Take 1 tablet by mouth every other day.   Yes [provider]  nitroGLYCERIN (NITROSTAT) 0.4 MG SL tablet DISSOLVE ONE TABLET UNDER THE TONGUE EVERY 5 MINUTES AS NEEDED FOR CHEST PAIN.  DO NOT EXCEED A TOTAL OF 3 DOSES IN 15 MINUTES Patient taking differently: Place 0.4 mg under the tongue every 5 (five) minutes x 3 doses as needed for chest pain. 02/04/22  Yes Chandrasekhar, Mahesh A, MD  olmesartan (BENICAR) 20 MG tablet Take 10 mg by mouth daily.   Yes [provider]  pantoprazole (PROTONIX) 40 MG tablet TAKE ONE TABLET BY MOUTH ONCE DAILY TAKE  30-60  MINUTES  BEFORE  FIRST  MEAL  OF  THE  DAY Patient taking differently: Take 40 mg by mouth daily before breakfast. 06/07/14  Yes Nyoka Cowden, MD  TRELEGY ELLIPTA 100-62.5-25 MCG/ACT AEPB USE 1 INHALATION BY MOUTH DAILY Patient taking differently: Inhale 1 puff into the lungs daily. 02/13/22  Yes Young, Joni Fears D, MD  doxycycline (VIBRA-TABS) 100 MG tablet Take 1 tablet (100 mg total) by mouth 2 (two) times daily. Patient not taking: Reported on 10/08/2022 08/19/22   Parrett, Virgel Bouquet, NP  furosemide (LASIX) 20 MG tablet Take 1 tablet (20 mg total) by mouth daily. Patient not taking: Reported on 10/08/2022 05/07/22   Sheilah Pigeon, PA-C  lidocaine (LIDODERM) 5 % Place 1 patch onto the skin daily. Remove & Discard patch within 12 hours or as directed by MD Patient not taking: Reported on 10/08/2022 09/29/22   Alvira Monday, MD  oxyCODONE (ROXICODONE) 5 MG immediate release tablet Take 1 tablet (5 mg total) by mouth every 4 (four) hours as needed for severe pain. Patient not taking: Reported on 10/08/2022 09/29/22   Alvira Monday, MD  predniSONE (DELTASONE) 10 MG tablet 4 tabs for 2 days, then 3 tabs for 2 days, 2 tabs for 2 days, then 1 tab  daily Patient not taking: Reported on 10/08/2022  08/19/22   Parrett, Virgel Bouquet, NP  Respiratory Therapy Supplies (FLUTTER) DEVI Blow through 4 times per set, 3 sets per day when needed to clear lungs 02/11/16   Waymon Budge, MD    Physical Exam: Vitals:   10/08/22  1758 10/08/22 1800 10/08/22 1815 10/08/22 1858  BP: (!) 142/74 (!) 142/74  (!) 151/87  Pulse: 73 81 81 73  Resp: 13 (!) 25 17 20   Temp: (!) 97.5 F (36.4 C)   98.5 F (36.9 C)  TempSrc: Oral   Oral  SpO2: 97% 94% 99% 96%  Weight:      Height:        Constitutional: NAD, calm, comfortable with no respiratory distress-noted to have some chronic cough but oriented x 3 and able to converse appropriately,  does appear tired. Eyes: PERRL, lids and conjunctivae normal ENMT: Mucous membranes are dry.  Posterior pharynx clear of any exudate or lesions.Normal dentition.  Neck: normal, supple, no masses, no thyromegaly Respiratory: clear to auscultation bilaterally, no wheezing, no crackles. Normal respiratory effort. No accessory muscle use.  Cardiovascular: Regular rate and rhythm, no murmurs / rubs / gallops. No extremity edema. 2+ pedal pulses. No carotid bruits.  S/p pacemaker on left ACW Abdomen: no tenderness, no masses palpated. No hepatosplenomegaly. Bowel sounds positive.  Musculoskeletal: no clubbing / cyanosis. No joint deformity upper and lower extremities. Good ROM, no contractures. Normal muscle tone.  Neurologic: CN 2-12 grossly intact. Sensation intact, DTR normal. Strength 5/5 in all 4.  Finger-to-nose and heel-to-shin test normal.  Did not test gait. Psychiatric: Normal judgment and insight currently.  Alert and oriented x 3. Normal mood.  SKIN/catheters: no rashes, lesions, ulcers. No induration  Labs on Admission: I have personally reviewed following labs and imaging studies  CBC: Recent Labs  Lab 10/08/22 1040 10/08/22 1107 10/08/22 1826  WBC 10.0  --  11.0*  NEUTROABS 6.4  --   --   HGB 15.3 16.0 14.4  HCT 44.5 47.0 43.3  MCV 91.8  --  91.5  PLT 206   --  200   Basic Metabolic Panel: Recent Labs  Lab 10/08/22 1040 10/08/22 1107  NA 124* 126*  K 3.4* 3.5  CL 93*  --   CO2 19*  --   GLUCOSE 148*  --   BUN 18  --   CREATININE 1.03  --   CALCIUM 8.2*  --    GFR: Estimated Creatinine Clearance: 71.9 mL/min (by C-G formula based on SCr of 1.03 mg/dL). Recent Labs  Lab 10/08/22 1040 10/08/22 1826  WBC 10.0 11.0*   Liver Function Tests: Recent Labs  Lab 10/08/22 1040  AST 29  ALT 22  ALKPHOS 54  BILITOT 0.9  PROT 6.6  ALBUMIN 3.4*   No results for input(s): "LIPASE", "AMYLASE" in the last 168 hours. No results for input(s): "AMMONIA" in the last 168 hours. Coagulation Profile: No results for input(s): "INR", "PROTIME" in the last 168 hours. Cardiac Enzymes: No results for input(s): "CKTOTAL", "CKMB", "CKMBINDEX", "TROPONINI" in the last 168 hours. BNP (last 3 results) Recent Labs    01/08/22 0852 04/23/22 0920  PROBNP 200 146   HbA1C: No results for input(s): "HGBA1C" in the last 72 hours. CBG: Recent Labs  Lab 10/08/22 1124 10/08/22 1859  GLUCAP 133* 234*   Lipid Profile: No results for input(s): "CHOL", "HDL", "LDLCALC", "TRIG", "CHOLHDL", "LDLDIRECT" in the last 72 hours. Thyroid Function Tests: No results for input(s): "TSH", "T4TOTAL", "FREET4", "T3FREE", "THYROIDAB" in the last 72 hours. Anemia Panel: No results for input(s): "VITAMINB12", "FOLATE", "FERRITIN", "TIBC", "IRON", "RETICCTPCT" in the last 72 hours. Urine analysis:    Component Value Date/Time   COLORURINE YELLOW 10/08/2022 1417   APPEARANCEUR CLEAR 10/08/2022 1417   LABSPEC 1.015 10/08/2022 1417  PHURINE 6.0 10/08/2022 1417   GLUCOSEU >=500 (A) 10/08/2022 1417   HGBUR NEGATIVE 10/08/2022 1417   BILIRUBINUR NEGATIVE 10/08/2022 1417   KETONESUR NEGATIVE 10/08/2022 1417   PROTEINUR NEGATIVE 10/08/2022 1417   UROBILINOGEN 0.2 01/17/2013 0859   NITRITE NEGATIVE 10/08/2022 1417   LEUKOCYTESUR NEGATIVE 10/08/2022 1417     Radiological Exams on Admission: Personally reviewed  CT HEAD WO CONTRAST  Result Date: 10/08/2022 CLINICAL DATA:  Altered mental status EXAM: CT HEAD WITHOUT CONTRAST TECHNIQUE: Contiguous axial images were obtained from the base of the skull through the vertex without intravenous contrast. RADIATION DOSE REDUCTION: This exam was performed according to the departmental dose-optimization program which includes automated exposure control, adjustment of the mA and/or kV according to patient size and/or use of iterative reconstruction technique. COMPARISON:  CT head 09/29/2022. FINDINGS: Brain: There is no evidence of acute intracranial hemorrhage, extra-axial fluid collection, or acute infarct. Parenchymal volume is normal. The ventricles are normal in size. Gray-white differentiation is preserved. Patchy hypodensity in the supratentorial white matter is nonspecific but likely reflects sequela of chronic small-vessel ischemic change. The pituitary and suprasellar region are normal. There is no mass lesion. There is no mass effect or midline shift. Vascular: No hyperdense vessel or unexpected calcification. Skull: Normal. Negative for fracture or focal lesion. Sinuses/Orbits: There is mild mucosal thickening in the paranasal sinuses. Bilateral lens implants are in place. The globes and orbits are otherwise unremarkable. Other: None. IMPRESSION: Stable noncontrast head CT with no acute intracranial pathology. Electronically Signed   By: Lesia Hausen M.D.   On: 10/08/2022 12:47   DG Chest 2 View  Result Date: 10/08/2022 CLINICAL DATA:  Cough and shortness of breath EXAM: CHEST - 2 VIEW COMPARISON:  09/29/2022 FINDINGS: Left upper chest pacemaker battery pack with leads along the right side of the heart. Stable cardiopericardial silhouette. Tortuous aorta. No pneumothorax, effusion or edema. Minimal linear opacity left lung base likely scar or atelectasis. Air-fluid level along the stomach beneath the left  hemidiaphragm. Films are under penetrated IMPRESSION: Under penetrated radiographs. Left upper chest pacemaker. Minimal opacity left lung base likely scar or atelectasis Electronically Signed   By: Karen Kays M.D.   On: 10/08/2022 11:11    EKG: Independently reviewed.  Normal sinus rhythm with LAFB, no acute changes since last EKG.  Prolonged QTc at 580 ms     Assessment and Plan:   Principal Problem:   Acute metabolic encephalopathy Active Problems:   COPD with acute exacerbation (HCC)   Ataxia   Prolonged Q-T interval on ECG   Pacemaker- DDD-Medtronic   Coronary artery disease of native artery of native heart with stable angina pectoris (HCC)   Dyslipidemia   GERD without esophagitis   COPD with chronic bronchitis (HCC)   OSA (obstructive sleep apnea)   CKD (chronic kidney disease) stage 2, GFR 60-89 ml/min   Hyperlipidemia associated with type 2 diabetes mellitus (HCC)   GERD (gastroesophageal reflux disease)   Chronic heart failure with preserved ejection fraction (HCC)    1. Acute metabolic encephalopathy: Likely secondary to hyponatremia.  Patient noted to be on Lasix at home and states was prescribed for bilateral leg swellings.  Never been diagnosed with CHF or pulmonary edema.  Currently appears euvolemic/dry.  Will hold diuretics and repeat labs in a.m. as patient did receive 500 mL of LR while in the ED.  Delirium precautions while here. Urine osmolality/electrolytes ordered.Hyponatremia could also be secondary to SIADH in the setting of neck pain recently.  Check uric acid (should be elevated and dehydration and vice versa and SIADH).  CT head unremarkable.  Denies any head trauma during the fall.  2.  Ataxia/fall: Unclear if related to problem #1.  CT head unremarkable.  Patient states his pacemaker is not MRI compatible.  Will treat problem #1 and reassess.  Neurochecks.  PT eval in AM.  Can consider repeat CT if concerns for focal neurological signs.  Finger-to-nose and  heel-to-shin test negative on my exam.  Check orthostatics and walking desaturation in AM.  3.  COPD exacerbation: Patient states he takes prednisone 10 mg every other day on a chronic basis and takes tapering doses during acute exacerbation upon his doctors direction.  Resume home nebulizer treatments-will give scheduled nebs for 24 hours.  He received 40 mg prednisone in the ED, will taper to 30 mg in AM.  Would avoid high-dose steroids and concern for problem #1.  Patient currently saturating well on room air.  No longer on home O2-for at least 3 years now.  4.  Prolonged QT interval: Avoid QT prolonging agents (patient noted to be on flecainide-consider cardiology evaluation in a.m. for further recommendations regarding this medication/dosing).  Keep potassium >4, Mg> 2.  Continue beta-blockers.  Patient does have history of bradycardia and is s/p pacemaker.  Repeat EKG in a.m.  5.  Chronic diastolic CHF: Patient denied any history of CHF but problem list does mention diastolic dysfunction.  States on diuretics at baseline for "leg edema".  Held diuretics for now in concern for hyponatremia.  6.  Obstructive sleep apnea: Resume nightly BiPAP (patient not sure of home settings but per Pulm note--BIPAP 10/6/ [employer] 10 S machine )  7.  Diabetes mellitus with complications of retinopathy, hyperlipidemia: Resume home medications-oral hypoglycemics.  Glucose less than 150 today  8.  Chronic back pain: Hold Neurontin/Flexeril and other sedatives and concern for problem #1 and problem #4   DVT prophylaxis: Lovenox  Code Status: Full code as confirmed with patient and granddaughter at bedside    .Health care proxy would be his daughter [place]  Patient/Family Communication: Discussed with patient and all questions answered to satisfaction.  Consults called: None Admission status :Patient will be admitted under OBSERVATION status.The patient's presenting symptoms, physical  exam findings, and initial radiographic and laboratory data in the context of their medical condition is felt to place them at low risk for further clinical deterioration. Furthermore, it is anticipated that the patient will be medically stable for discharge from the hospital within 2 midnights of hospital stay.       Alessandra Bevels MD [facility] Pager in [place]  If 7PM-7AM, please contact night-coverage [email or web address]   10/08/2022, 7:31 PM

## 2022-10-08 NOTE — Telephone Encounter (Signed)
Spoke with patients daughter [name] [name]. She states patient is in the ER- was told he is old and there is not much they can do. He is being seen for copd exacerbation there given him a nebulizer treatment which he gets at home already. Over the weekend patient developed a cough, became confused, staggering around-daughter states couldn't understand what he was really saying, and has pressure between his eyes She doesn't seem to think it was a stroke-ER is currently doing imaging to potentially rule out a stroke.  Patient states he hasn't feel or over exerted hisself. Daughter is really worried since symptoms have came on suddenly  Dr. Maple Hudson can you please advise? Pharmacy [facility] Requesting an antibiotic and prednisone if needed

## 2022-10-08 NOTE — Telephone Encounter (Signed)
Spoke with patients daughter. She has advised patient is still being evaluated in the hospital. I advised her to call back once he's discharged for a f/u. She verbalized understanding. NFN

## 2022-10-09 ENCOUNTER — Observation Stay (HOSPITAL_COMMUNITY): Payer: 59

## 2022-10-09 DIAGNOSIS — E11319 Type 2 diabetes mellitus with unspecified diabetic retinopathy without macular edema: Secondary | ICD-10-CM | POA: Diagnosis present

## 2022-10-09 DIAGNOSIS — E871 Hypo-osmolality and hyponatremia: Secondary | ICD-10-CM

## 2022-10-09 DIAGNOSIS — M1990 Unspecified osteoarthritis, unspecified site: Secondary | ICD-10-CM | POA: Diagnosis present

## 2022-10-09 DIAGNOSIS — J9612 Chronic respiratory failure with hypercapnia: Secondary | ICD-10-CM | POA: Diagnosis present

## 2022-10-09 DIAGNOSIS — I472 Ventricular tachycardia, unspecified: Secondary | ICD-10-CM | POA: Diagnosis present

## 2022-10-09 DIAGNOSIS — Z1152 Encounter for screening for COVID-19: Secondary | ICD-10-CM | POA: Diagnosis not present

## 2022-10-09 DIAGNOSIS — G4733 Obstructive sleep apnea (adult) (pediatric): Secondary | ICD-10-CM | POA: Diagnosis present

## 2022-10-09 DIAGNOSIS — J441 Chronic obstructive pulmonary disease with (acute) exacerbation: Secondary | ICD-10-CM | POA: Diagnosis not present

## 2022-10-09 DIAGNOSIS — Z6830 Body mass index (BMI) 30.0-30.9, adult: Secondary | ICD-10-CM | POA: Diagnosis not present

## 2022-10-09 DIAGNOSIS — I495 Sick sinus syndrome: Secondary | ICD-10-CM | POA: Diagnosis present

## 2022-10-09 DIAGNOSIS — E785 Hyperlipidemia, unspecified: Secondary | ICD-10-CM | POA: Diagnosis present

## 2022-10-09 DIAGNOSIS — E1169 Type 2 diabetes mellitus with other specified complication: Secondary | ICD-10-CM | POA: Diagnosis present

## 2022-10-09 DIAGNOSIS — I5032 Chronic diastolic (congestive) heart failure: Secondary | ICD-10-CM | POA: Diagnosis present

## 2022-10-09 DIAGNOSIS — R270 Ataxia, unspecified: Secondary | ICD-10-CM | POA: Diagnosis present

## 2022-10-09 DIAGNOSIS — I10 Essential (primary) hypertension: Secondary | ICD-10-CM | POA: Diagnosis present

## 2022-10-09 DIAGNOSIS — G9341 Metabolic encephalopathy: Secondary | ICD-10-CM | POA: Diagnosis not present

## 2022-10-09 DIAGNOSIS — M549 Dorsalgia, unspecified: Secondary | ICD-10-CM | POA: Diagnosis present

## 2022-10-09 DIAGNOSIS — E1142 Type 2 diabetes mellitus with diabetic polyneuropathy: Secondary | ICD-10-CM | POA: Diagnosis present

## 2022-10-09 DIAGNOSIS — K219 Gastro-esophageal reflux disease without esophagitis: Secondary | ICD-10-CM | POA: Diagnosis present

## 2022-10-09 DIAGNOSIS — E669 Obesity, unspecified: Secondary | ICD-10-CM | POA: Diagnosis present

## 2022-10-09 DIAGNOSIS — E222 Syndrome of inappropriate secretion of antidiuretic hormone: Secondary | ICD-10-CM | POA: Diagnosis present

## 2022-10-09 DIAGNOSIS — R001 Bradycardia, unspecified: Secondary | ICD-10-CM | POA: Diagnosis present

## 2022-10-09 DIAGNOSIS — J9811 Atelectasis: Secondary | ICD-10-CM | POA: Diagnosis present

## 2022-10-09 DIAGNOSIS — G9340 Encephalopathy, unspecified: Secondary | ICD-10-CM | POA: Diagnosis present

## 2022-10-09 DIAGNOSIS — G8929 Other chronic pain: Secondary | ICD-10-CM | POA: Diagnosis present

## 2022-10-09 DIAGNOSIS — M542 Cervicalgia: Secondary | ICD-10-CM | POA: Diagnosis present

## 2022-10-09 LAB — BASIC METABOLIC PANEL
Anion gap: 10 (ref 5–15)
BUN: 19 mg/dL (ref 8–23)
CO2: 23 mmol/L (ref 22–32)
Calcium: 8.3 mg/dL — ABNORMAL LOW (ref 8.9–10.3)
Chloride: 93 mmol/L — ABNORMAL LOW (ref 98–111)
Creatinine, Ser: 1.02 mg/dL (ref 0.61–1.24)
GFR, Estimated: >60 mL/min (ref >60)
Glucose, Bld: 159 mg/dL — ABNORMAL HIGH (ref 70–99)
Potassium: 3.2 mmol/L — ABNORMAL LOW (ref 3.5–5.1)
Sodium: 126 mmol/L — ABNORMAL LOW (ref 135–145)

## 2022-10-09 MED ORDER — AMIODARONE HCL 200 MG PO TABS: 200.0000 mg | ORAL_TABLET | Freq: Two times a day (BID) | ORAL | Status: DC

## 2022-10-09 MED ORDER — SODIUM CHLORIDE 1 G PO TABS
2.0000 g | ORAL_TABLET | Freq: Two times a day (BID) | ORAL | Status: DC
  Administered 2022-10-09 – 2022-10-10 (×3): 2 g via ORAL
  Filled 2022-10-09 (×3): qty 2

## 2022-10-09 MED ORDER — POTASSIUM CHLORIDE 20 MEQ PO PACK
40.0000 meq | PACK | Freq: Two times a day (BID) | ORAL | Status: AC
  Administered 2022-10-09 (×2): 40 meq via ORAL
  Filled 2022-10-09: qty 2

## 2022-10-09 NOTE — Evaluation (Signed)
Occupational Therapy Evaluation Patient Details Name: [name] [name] MRN: [id number] DOB: 19-Jun-1945 Today's Date: 10/09/2022   History of Present Illness Pt is a 77 y.o. male who presented 10/08/22 with cough, SOB, AMS, L shoulder pain, and difficulty ambulating. CT head showed no acute intracranial pathology. Pt admitted with acute metabolic encephalopathy likely secondary to hyponatremia. PMH: COPD, T2DM, HTN, peripheral neuropathy, HFpEF, HLD, CKD, OSA, bradycardia s/p pacemaker, carpal tunnel syndrome bil   Clinical Impression   Patient admitted for the diagnosis above.  PTA he lives at home alone, but has a granddaughter that lives close and checks on him frequently.  Patient continues to struggle with balance and mild safety, but appears to be improving as the day goes on.  Currently he is needing light Min Guard to supervision for ADL completion and  in room mobility at a [facility] level.  OT will continue efforts in the acute setting to address deficits, but no post acute OT is anticipated.        Recommendations for follow up therapy are one component of a multi-disciplinary discharge planning process, led by the attending physician.  Recommendations may be updated based on patient status, additional functional criteria and insurance authorization.   Assistance Recommended at Discharge Set up Supervision/Assistance  Patient can return home with the following Assist for transportation    Functional Status Assessment  Patient has had a recent decline in their functional status and demonstrates the ability to make significant improvements in function in a reasonable and predictable amount of time.  Equipment Recommendations  None recommended by OT    Recommendations for Other Services       Precautions / Restrictions Precautions Precautions: Fall Restrictions Weight Bearing Restrictions: No      Mobility Bed Mobility Overal bed mobility: Modified Independent                   Transfers Overall transfer level: Needs assistance Equipment used: Straight cane Transfers: Sit to/from Stand, Bed to chair/wheelchair/BSC Sit to Stand: Supervision     Step pivot transfers: Supervision     General transfer comment: supervision with light Min Guard.      Balance Overall balance assessment: Mild deficits observed, not formally tested                                         ADL either performed or assessed with clinical judgement   ADL Overall ADL's : Needs assistance/impaired     Grooming: Wash/dry hands;Wash/dry face;Supervision/safety;Standing           Upper Body Dressing : Set up;Sitting   Lower Body Dressing: Min guard;Sit to/from stand   Toilet Transfer: Solicitor;Ambulation                   Vision Baseline Vision/History: 1 Wears glasses Patient Visual Report: No change from baseline       Perception     Praxis      Pertinent Vitals/Pain Pain Assessment Pain Assessment: No/denies pain     Hand Dominance Right   Extremity/Trunk Assessment Upper Extremity Assessment Upper Extremity Assessment: Overall WFL for tasks assessed   Lower Extremity Assessment Lower Extremity Assessment: Defer to PT evaluation   Cervical / Trunk Assessment Cervical / Trunk Assessment: Normal   Communication Communication Communication: No difficulties   Cognition Arousal/Alertness: Awake/alert Behavior During Therapy: WFL for tasks assessed/performed Overall Cognitive  Status: Within Functional Limits for tasks assessed                                 General Comments: Appears to be continuing to clear cognitively.  Followed commands well, demonstrated good safety.     General Comments       Exercises     Shoulder Instructions      Home Living Family/patient expects to be discharged to:: Private residence Living Arrangements: Alone Available Help at Discharge: Family;Available 24  hours/day Type of Home: Mobile home Home Access: Stairs to enter [employer] of Steps: 4 Entrance Stairs-Rails: Can reach both Home Layout: One level     Bathroom Shower/Tub: Chief Strategy Officer: Standard Bathroom Accessibility: Yes How Accessible: Accessible via walker Home Equipment: Cane - single point;Rollator (4 wheels)          Prior Functioning/Environment Prior Level of Function : Independent/Modified Independent;Driving               ADLs Comments: Ind with ADL and iADL.  Uses microwave for meals, drives locally.        OT Problem List: Decreased safety awareness;Impaired balance (sitting and/or standing)      OT Treatment/Interventions: Self-care/ADL training;Therapeutic activities;Patient/family education;Balance training    OT Goals(Current goals can be found in the care plan section) Acute Rehab OT Goals Patient Stated Goal: Hoping to return home soon OT Goal Formulation: With patient Time For Goal Achievement: 10/23/22 Potential to Achieve Goals: Good ADL Goals Pt Will Perform Grooming: with modified independence;standing Pt Will Perform Lower Body Dressing: with modified independence;sit to/from stand Pt Will Transfer to Toilet: with modified independence;regular height toilet;ambulating  OT Frequency: Min 2X/week    Co-evaluation              AM-PAC OT "6 Clicks" Daily Activity     Outcome Measure Help from another person eating meals?: None Help from another person taking care of personal grooming?: None Help from another person toileting, which includes using toliet, bedpan, or urinal?: A Little Help from another person bathing (including washing, rinsing, drying)?: A Little Help from another person to put on and taking off regular upper body clothing?: None Help from another person to put on and taking off regular lower body clothing?: A Little 6 Click Score: 21   End of Session Equipment Utilized During  Treatment: Gait belt Nurse Communication: Mobility status  Activity Tolerance: Patient tolerated treatment well Patient left: in chair;with call bell/phone within reach;with chair alarm set  OT Visit Diagnosis: Unsteadiness on feet (R26.81)                Time: 1610-9604 OT Time Calculation (min): 18 min Charges:  OT General Charges $OT Visit: 1 Visit OT Evaluation $OT Eval Moderate Complexity: 1 Mod  10/09/2022  RP, OTR/L  Acute Rehabilitation Services  Office:  [phone]   Suzanna Obey 10/09/2022, 4:24 PM

## 2022-10-09 NOTE — Progress Notes (Signed)
Pt started on Amiodarone today, first dose scheduled tonight at 2200 but patient expressed to this nurse that after speaking with his primary care physicians, he world prefer not to take this medication. This information was reported to his care team and oncoming Nurse.

## 2022-10-09 NOTE — TOC Initial Note (Signed)
Transition of Care [facility]) - Initial/Assessment Note    Patient Details  Name: [name] [name] MRN: [id number] Date of Birth: 1945-08-04  Transition of Care [facility]) CM/SW Contact:    Kermit Balo, RN Phone Number: 10/09/2022, 3:41 PM  Clinical Narrative:                 Pt is from home alone. His granddaughter can check in on him.  Pt uses Bipap at home.  He manages his own medications and denies any issues.  Pt drives self but granddaughter can assist if needed.  No f/u per PT.  TOC following.   Expected Discharge Plan: Home/Self Care Barriers to Discharge: Continued Medical Work up   Patient Goals and CMS Choice            Expected Discharge Plan and Services   Discharge Planning Services: CM Consult   Living arrangements for the past 2 months: Single Family Home                                      Prior Living Arrangements/Services Living arrangements for the past 2 months: Single Family Home Lives with:: Self Patient language and need for interpreter reviewed:: Yes Do you feel safe going back to the place where you live?: Yes          Current home services: DME (walker/) Criminal Activity/Legal Involvement Pertinent to Current Situation/Hospitalization: No - Comment as needed  Activities of Daily Living Home Assistive Devices/Equipment: CBG Meter, Cane (specify quad or straight) ADL Screening (condition at time of admission) Patient's cognitive ability adequate to safely complete daily activities?: Yes Is the patient deaf or have difficulty hearing?: No Does the patient have difficulty seeing, even when wearing glasses/contacts?: No Does the patient have difficulty concentrating, remembering, or making decisions?: No Patient able to express need for assistance with ADLs?: Yes Does the patient have difficulty dressing or bathing?: No Independently performs ADLs?: Yes (appropriate for developmental age) Does the patient have difficulty walking or  climbing stairs?: Yes Weakness of Legs: None Weakness of Arms/Hands: None  Permission Sought/Granted                  Emotional Assessment Appearance:: Appears stated age Attitude/Demeanor/Rapport: Engaged Affect (typically observed): Accepting Orientation: : Oriented to Self, Oriented to Place, Oriented to  Time, Oriented to Situation   Psych Involvement: No (comment)  Admission diagnosis:  Hyponatremia [E87.1] Disorientation [R41.0] COPD exacerbation (HCC) [J44.1] AMS (altered mental status) [R41.82] Acute encephalopathy [G93.40] Patient Active Problem List   Diagnosis Date Noted   Acute encephalopathy 10/09/2022   AMS (altered mental status) 10/08/2022   Acute metabolic encephalopathy 10/08/2022   Ataxia 10/08/2022   Prolonged Q-T interval on ECG 10/08/2022   PVC's (premature ventricular contractions) 12/13/2021   Atrial tachycardia 12/13/2021   CAP (community acquired pneumonia) 07/06/2021   Sepsis due to pneumonia (HCC) 07/05/2021   Dyslipidemia 07/05/2021   GERD without esophagitis 07/05/2021   CKD (chronic kidney disease) stage 2, GFR 60-89 ml/min 07/05/2021   Peripheral neuropathy 07/05/2021   Physical deconditioning 07/05/2021   Chronic heart failure with preserved ejection fraction (HCC) 06/22/2020   Coronary artery disease of native artery of native heart with stable angina pectoris (HCC) 05/08/2020   Mixed hyperlipidemia 05/08/2020   Aortic atherosclerosis (HCC) 05/08/2020   Diabetes mellitus with coincident hypertension (HCC) 05/08/2020   Peripheral arterial disease (HCC) 11/05/2018  OSA (obstructive sleep apnea) 07/30/2018   Chronic respiratory failure with hypoxia (HCC) 04/26/2018   GERD (gastroesophageal reflux disease) 10/26/2017   Acute bronchiolitis 06/18/2017   Ulnar nerve compression 03/24/2017   COPD with chronic bronchitis (HCC) 12/15/2014   COPD with acute exacerbation (HCC) 07/16/2014   Upper airway cough syndrome 03/16/2014   DJD  (degenerative joint disease) of hip 01/25/2013    Class: Chronic   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 [place] [address] [place] [address] Phone: [phone] Fax: [phone]  [facility] - [place], [place] - [address] [place]  [address] Phone: [phone] Fax: [phone]  [id number] - [place], [place] - [address] AT [facility] [address] [place] [address] Phone: [phone] Fax: [phone]     Social Determinants of Health (SDOH) Social History: SDOH Screenings   Food Insecurity: No Food Insecurity (10/08/2022)  Housing: Low Risk  (10/08/2022)  Transportation Needs: No Transportation Needs (10/08/2022)  Utilities: Not At Risk (10/08/2022)  Depression (PHQ2-9): Low Risk  (02/11/2022)  Tobacco Use: Medium Risk (10/08/2022)   SDOH Interventions:     Readmission Risk Interventions     No data to display

## 2022-10-09 NOTE — Progress Notes (Addendum)
[facility] PROGRESS NOTE   [name] [name] RJJ:[id number] DOB: 06-22-1945 DOA: 10/08/2022  PCP: [name], MD  Brief History/Interval Summary: 77 y.o. male with history h/o HTN, HLP, GERD, OSA, chronic back pain, diet-controlled diabetes, bradycardia s/p pacemaker, bilateral lower extremity arthritis/peripheral neuropathy, advanced COPD on chronic prednisone maintenance therapy brought in by family and concern for altered mental status.  Patient was recently prescribed oxycodone for neck pain but he took only 1 dose.  Noted to have low sodium levels which was thought to be one of the reasons for his altered mental status.  Could not undergo MRI due to noncompatible pacemaker.  Hospitalized for further management.     Consultants: None  Procedures: None yet    Subjective/Interval History: Patient mentions that he is feeling well this morning.  Daughter feels that he is a bit better this morning.  He has ambulated to the bathroom and did better compared to yesterday.  Denies any headaches.  No nausea vomiting.  Denies any weakness in any 1 side of his body.    Assessment/Plan:  Acute metabolic encephalopathy/ataxia/fall No obvious focal neurological deficits on examination.  CT head did not show any acute findings.  Cannot do MRI due to MRI incompatible pacemaker.  Will repeat CT head this morning. PT and OT evaluation is pending. Low sodium level likely contributed to his encephalopathy.  Diuretics are on hold.  Labs are pending from today. Daughter reports improvement in his mentation.  Hyponatremia Could be due to SIADH based on urine osmolality and low uric acid level.  However his urine sodium level was also low.  He was given IV fluids. Await labs from this morning.  Diuretics on hold. Sodium level noted to be 126 this morning. Will start salt tablets. Fluid restrictions. Recheck labs tomorrow.  History of COPD with chronic hypercapnic respiratory failure Supposed  to use BiPAP at night but he apparently is not very compliant. On chronic steroids at home.  Thought to have mild exacerbation on admission.  Chest x-ray did not show any acute findings.  Atelectasis was noted. Saturating normal on room air.  Does not use oxygen at home. Higher dose of steroids here in the hospital.  QT prolongation Discussed with cardiology.  Flecainide currently on hold.  EKG to be repeated today.  Pacemaker to be interrogated.  Chronic diastolic CHF No significant pedal edema noted.  Diuretics on hold due to hyponatremia  Obstructive sleep apnea On BiPAP at home as mentioned above.  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 He has been continued on glimepiride.  Also noted to be on Jardiance.  Essential hypertension Noted to be on diltiazem, irbesartan, Imdur and metoprolol.  Chronic back pain Holding his Neurontin and Flexeril and other sedatives due to his encephalopathy.  No neurological deficits.  Obesity Estimated body mass index is 30.52 kg/m as calculated from the following:   Height as of this encounter: 6' (1.829 m).   Weight as of this encounter: 102.1 kg.  DVT Prophylaxis: Lovenox Code Status: Full code Family Communication: Discussed with daughter Disposition Plan: Hopefully return home when improved.  Status is: Observation The patient remains OBS appropriate and may or may not d/c before 2 midnights.      Medications: Scheduled:  aspirin EC  81 mg Oral Daily   atorvastatin  80 mg Oral Daily   diltiazem  120 mg Oral Daily   docusate sodium  100 mg Oral BID   empagliflozin  10 mg  Oral QAC breakfast   enoxaparin (LOVENOX) injection  40 mg Subcutaneous Q24H   ezetimibe  10 mg Oral Daily   famotidine  20 mg Oral Q breakfast   glimepiride  2 mg Oral QPC breakfast   ipratropium-albuterol  3 mL Inhalation QID   irbesartan  37.5 mg Oral Daily   isosorbide mononitrate  60 mg Oral Daily   loratadine  10 mg  Oral QHS   metoprolol tartrate  50 mg Oral BID   montelukast  10 mg Oral QHS   pantoprazole  40 mg Oral Daily   predniSONE  30 mg Oral Q breakfast   Continuous:  albuterol 10 mg/hr (10/08/22 1422)   ZOX:[id number], ipratropium-albuterol, nitroGLYCERIN, oxyCODONE  Antibiotics: Anti-infectives (From admission, onward)    None       Objective:  Vital Signs  Vitals:   10/09/22 0000 10/09/22 0400 10/09/22 0700 10/09/22 0819  BP: 124/74 123/75 118/79   Pulse: 67 80 60   Resp: 20 18 20 18   Temp: 97.9 F (36.6 C) 97.7 F (36.5 C) 98 F (36.7 C)   TempSrc: Oral Oral Oral   SpO2: 96% 96% 98% 98%  Weight:      Height:        Intake/Output Summary (Last 24 hours) at 10/09/2022 0932 Last data filed at 10/08/2022 1600 Gross per 24 hour  Intake 500 ml  Output --  Net 500 ml   Filed Weights   10/08/22 1025  Weight: 102.1 kg    General appearance: Awake alert.  In no distress Resp: Normal effort at rest.  Scattered wheezing bilaterally. Cardio: S1-S2 is normal regular.  No S3-S4.  No rubs murmurs or bruit GI: Abdomen is soft.  Nontender nondistended.  Bowel sounds are present normal.  No masses organomegaly Extremities: No edema.  Full range of motion of lower extremities. Neurologic: Alert and oriented x3.  No focal neurological deficits.    Lab Results:  Data Reviewed: I have personally reviewed following labs and reports of the imaging studies  CBC: Recent Labs  Lab 10/08/22 1040 10/08/22 1107 10/08/22 1826  WBC 10.0  --  11.0*  NEUTROABS 6.4  --   --   HGB 15.3 16.0 14.4  HCT 44.5 47.0 43.3  MCV 91.8  --  91.5  PLT 206  --  200    Basic Metabolic Panel: Recent Labs  Lab 10/08/22 1040 10/08/22 1107 10/08/22 1826 10/08/22 2129  NA 124* 126*  --   --   K 3.4* 3.5  --   --   CL 93*  --   --   --   CO2 19*  --   --   --   GLUCOSE 148*  --   --   --   BUN 18  --   --   --   CREATININE 1.03  --  1.27*  --   CALCIUM 8.2*  --   --   --   MG  --   --    --  1.9  PHOS  --   --   --  3.6    GFR: Estimated Creatinine Clearance: 58.3 mL/min (A) (by C-G formula based on SCr of 1.27 mg/dL (H)).  Liver Function Tests: Recent Labs  Lab 10/08/22 1040  AST 29  ALT 22  ALKPHOS 54  BILITOT 0.9  PROT 6.6  ALBUMIN 3.4*   CBG: Recent Labs  Lab 10/08/22 1124 10/08/22 1859  GLUCAP 133* 234*     Recent  Results (from the past 240 hour(s))  Resp panel by RT-PCR (RSV, Flu A&B, Covid) Anterior Nasal Swab     Status: None   Collection Time: 10/08/22 10:33 AM   Specimen: Anterior Nasal Swab  Result Value Ref Range Status   SARS Coronavirus 2 by RT PCR NEGATIVE NEGATIVE Final   Influenza A by PCR NEGATIVE NEGATIVE Final   Influenza B by PCR NEGATIVE NEGATIVE Final    Comment: (NOTE) The Xpert Xpress SARS-CoV-2/FLU/RSV plus assay is intended as an aid in the diagnosis of influenza from Nasopharyngeal swab specimens and should not be used as a sole basis for treatment. Nasal washings and aspirates are unacceptable for Xpert Xpress SARS-CoV-2/FLU/RSV testing.  Fact Sheet for Patients: BloggerCourse.com  Fact Sheet for Healthcare Providers: SeriousBroker.it  This test is not yet approved or cleared by the [place] FDA and has been authorized for detection and/or diagnosis of SARS-CoV-2 by FDA under an Emergency Use Authorization (EUA). This EUA will remain in effect (meaning this test can be used) for the duration of the COVID-19 declaration under Section 564(b)(1) of the Act, 21 U.S.C. section 360bbb-3(b)(1), unless the authorization is terminated or revoked.     Resp Syncytial Virus by PCR NEGATIVE NEGATIVE Final    Comment: (NOTE) Fact Sheet for Patients: BloggerCourse.com  Fact Sheet for Healthcare Providers: SeriousBroker.it  This test is not yet approved or cleared by the [place] FDA and has been authorized for  detection and/or diagnosis of SARS-CoV-2 by FDA under an Emergency Use Authorization (EUA). This EUA will remain in effect (meaning this test can be used) for the duration of the COVID-19 declaration under Section 564(b)(1) of the Act, 21 U.S.C. section 360bbb-3(b)(1), unless the authorization is terminated or revoked.  Performed at [facility], [address], [place], [place] [address]       Radiology Studies: CT HEAD WO CONTRAST  Result Date: 10/08/2022 CLINICAL DATA:  Altered mental status EXAM: CT HEAD WITHOUT CONTRAST TECHNIQUE: Contiguous axial images were obtained from the base of the skull through the vertex without intravenous contrast. RADIATION DOSE REDUCTION: This exam was performed according to the departmental dose-optimization program which includes automated exposure control, adjustment of the mA and/or kV according to patient size and/or use of iterative reconstruction technique. COMPARISON:  CT head 09/29/2022. FINDINGS: Brain: There is no evidence of acute intracranial hemorrhage, extra-axial fluid collection, or acute infarct. Parenchymal volume is normal. The ventricles are normal in size. Gray-white differentiation is preserved. Patchy hypodensity in the supratentorial white matter is nonspecific but likely reflects sequela of chronic small-vessel ischemic change. The pituitary and suprasellar region are normal. There is no mass lesion. There is no mass effect or midline shift. Vascular: No hyperdense vessel or unexpected calcification. Skull: Normal. Negative for fracture or focal lesion. Sinuses/Orbits: There is mild mucosal thickening in the paranasal sinuses. Bilateral lens implants are in place. The globes and orbits are otherwise unremarkable. Other: None. IMPRESSION: Stable noncontrast head CT with no acute intracranial pathology. Electronically Signed   By: Lesia Hausen M.D.   On: 10/08/2022 12:47   DG Chest 2 View  Result Date: 10/08/2022 CLINICAL DATA:  Cough  and shortness of breath EXAM: CHEST - 2 VIEW COMPARISON:  09/29/2022 FINDINGS: Left upper chest pacemaker battery pack with leads along the right side of the heart. Stable cardiopericardial silhouette. Tortuous aorta. No pneumothorax, effusion or edema. Minimal linear opacity left lung base likely scar or atelectasis. Air-fluid level along the stomach beneath the left hemidiaphragm.  Films are under penetrated IMPRESSION: Under penetrated radiographs. Left upper chest pacemaker. Minimal opacity left lung base likely scar or atelectasis Electronically Signed   By: Karen Kays M.D.   On: 10/08/2022 11:11       LOS: 0 days   Ellen Goris Rito Ehrlich  [facility] Pager on [email or web address]  10/09/2022, 9:32 AM

## 2022-10-09 NOTE — Consult Note (Signed)
ELECTROPHYSIOLOGY CONSULT NOTE    Patient ID: [name] [name] MRN: [id number], DOB/AGE: [birth year] 77 y.o.  Admit date: 10/08/2022 Date of Consult: 10/09/2022  Primary Physician: [name], MD Primary Cardiologist: Christell Constant, MD  Electrophysiologist: Dr. Graciela Husbands   Referring Provider: Dr. Rito Ehrlich  Patient Profile: [name] [name] is a 77 y.o. 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 Rito Ehrlich.  HPI:  [name] [name] is a 77 y.o. male with medical history as above.   Followed Dr. Graciela Husbands and started on flecainide in addition to his metoprolol earlier this year in the setting of PVCs/NSVT/sustained VT. No VT on ICD interrogation today, but continues to have somewhat frequent PVCs on telemetry.   Pt has been having on and off neck pain since Memorial day weekend. Was prescribed oxycodone of which he only took 1 dose since being home.  Pt had some confusion on Monday but was able to be re-oriented. He had worsening confusion yesterday and a fall while walking to the bathroom. With ongoing confusion and concerns for ataxia, pt brought to ED.   Pt with SOB and wheezing on arrival that improved with nebs. Na 124, down from 134 10 days ago. Admitted for AMS +/- COPD exacerbation.  CT head unremarkable.   There was concern for QT prolongation on his EKG and EP asked to see for clarity given his flecainide. EKG appears to have over estimated his QT and QTc more likely similar to previous ~480 ms, but with multiple on-going issues and h/o sustained VT, flecainide not felt to be the best option.   Pt working with PT on our arrival. Granddaughter in room says he is almost back to baseline mentally, but still requiring some re-direction. Denies syncope or med non-compliance. Orthostatics negative.   Labs Potassium3.2* (06/06  4098) Magnesium  1.9 (06/05 2129) Creatinine, ser  1.02 (06/06 0846) PLT  200 (06/05 1826) HGB  14.4 (06/05 1826) WBC 11.0* (06/05 1826)  .     Allergies, Medical, Surgical, Social, and Family Histories have been reviewed and are referenced here-in when relevant for medical decision making.     Facility-Administered Medications Prior to Admission  Medication Dose Route Frequency Provider Last Rate Last Admin   albuterol (PROVENTIL) (2.5 MG/3ML) 0.083% nebulizer solution 2.5 mg  2.5 mg Nebulization Once Parrett, Tammy S, NP       Medications Prior to Admission  Medication Sig Dispense Refill Last Dose   albuterol (VENTOLIN HFA) 108 (90 Base) MCG/ACT inhaler Inhale 2 puffs into the lungs every 6 (six) hours as needed for wheezing or shortness of breath.   Past Week   aspirin 81 MG tablet Take 81 mg by mouth daily.   10/07/2022   atorvastatin (LIPITOR) 80 MG tablet TAKE 1 TABLET BY MOUTH ONCE  DAILY 90 tablet 2 10/07/2022   CALCIUM-MAGNESIUM-ZINC PO Take 1 tablet by mouth daily.   10/07/2022   cyclobenzaprine (FLEXERIL) 10 MG tablet Take 1 tablet (10 mg total) by mouth 2 (two) times daily as needed for muscle spasms. 20 tablet 0 10/07/2022   diltiazem (CARDIZEM SR) 120 MG 12 hr capsule Take 1 capsule (120 mg total) by mouth daily. (Patient taking differently: Take 120 mg by mouth every evening.) 90 capsule 3 10/07/2022   docusate sodium (COLACE) 100 MG capsule Take 100 mg by mouth daily.  10/07/2022   empagliflozin (JARDIANCE) 10 MG TABS tablet Take 1 tablet (10 mg total) by mouth daily before breakfast. 90 tablet 3 10/07/2022   ezetimibe (ZETIA) 10 MG tablet Take 1 tablet by mouth once daily (Patient taking differently: Take 10 mg by mouth every evening.) 90 tablet 3 10/07/2022   famotidine (PEPCID) 20 MG tablet TAKE ONE TABLET BY MOUTH ONCE DAILY AT BEDTIME (Patient taking differently: Take 20 mg by mouth at bedtime.) 30 tablet 0 10/07/2022   flecainide (TAMBOCOR) 100 MG tablet Take 1 tablet (100 mg total)  by mouth 2 (two) times daily. 180 tablet 3 10/07/2022   furosemide (LASIX) 40 MG tablet Take 40 mg by mouth daily.   10/07/2022   gabapentin (NEURONTIN) 300 MG capsule Take 300 mg by mouth 2 (two) times daily.   10/07/2022   glimepiride (AMARYL) 2 MG tablet Take 2 mg by mouth daily with breakfast.   10/07/2022   Guaifenesin (MUCINEX MAXIMUM STRENGTH) 1200 MG TB12 Take 1,200 mg by mouth 2 (two) times daily.   10/07/2022   ipratropium-albuterol (DUONEB) 0.5-2.5 (3) MG/3ML SOLN Inhale 3 mLs into the lungs every 6 (six) hours as needed (wheezing or shortness of breath). 360 mL 12 10/08/2022   isosorbide mononitrate (IMDUR) 60 MG 24 hr tablet Take 1 tablet (60 mg total) by mouth daily. 90 tablet 3 10/07/2022   levocetirizine (XYZAL) 5 MG tablet Take 5 mg at bedtime by mouth.    10/07/2022   metoprolol tartrate (LOPRESSOR) 50 MG tablet Take 1 tablet (50 mg total) by mouth 2 (two) times daily. 180 tablet 3 10/07/2022 at 2030   montelukast (SINGULAIR) 10 MG tablet Take 10 mg by mouth daily.   10/07/2022   Multiple Vitamin (MULTIVITAMIN WITH MINERALS) TABS tablet Take 1 tablet by mouth every other day.   10/07/2022   nitroGLYCERIN (NITROSTAT) 0.4 MG SL tablet DISSOLVE ONE TABLET UNDER THE TONGUE EVERY 5 MINUTES AS NEEDED FOR CHEST PAIN.  DO NOT EXCEED A TOTAL OF 3 DOSES IN 15 MINUTES (Patient taking differently: Place 0.4 mg under the tongue every 5 (five) minutes x 3 doses as needed for chest pain.) 25 tablet 5 Past Month   olmesartan (BENICAR) 20 MG tablet Take 10 mg by mouth daily.   10/07/2022   pantoprazole (PROTONIX) 40 MG tablet TAKE ONE TABLET BY MOUTH ONCE DAILY TAKE  30-60  MINUTES  BEFORE  FIRST  MEAL  OF  THE  DAY (Patient taking differently: Take 40 mg by mouth daily before breakfast.) 30 tablet 0 10/07/2022   TRELEGY ELLIPTA 100-62.5-25 MCG/ACT AEPB USE 1 INHALATION BY MOUTH DAILY (Patient taking differently: Inhale 1 puff into the lungs daily.) 180 each 3 10/07/2022   doxycycline (VIBRA-TABS) 100 MG tablet Take 1 tablet  (100 mg total) by mouth 2 (two) times daily. (Patient not taking: Reported on 10/08/2022) 14 tablet 0 Not Taking   furosemide (LASIX) 20 MG tablet Take 1 tablet (20 mg total) by mouth daily. (Patient not taking: Reported on 10/08/2022) 90 tablet 3 Not Taking   lidocaine (LIDODERM) 5 % Place 1 patch onto the skin daily. Remove & Discard patch within 12 hours or as directed by MD (Patient not taking: Reported on 10/08/2022) 30 patch 0 Not Taking   oxyCODONE (ROXICODONE) 5 MG immediate release tablet Take 1 tablet (5 mg total) by mouth every 4 (four) hours as needed for severe pain. (Patient not taking: Reported on 10/08/2022) 10 tablet 0 Not Taking   predniSONE (DELTASONE) 10 MG tablet 4  tabs for 2 days, then 3 tabs for 2 days, 2 tabs for 2 days, then 1 tab  daily (Patient not taking: Reported on 10/08/2022) 20 tablet 0 Not Taking   Respiratory Therapy Supplies (FLUTTER) DEVI Blow through 4 times per set, 3 sets per day when needed to clear lungs 1 each 0     Inpatient Medications:   amiodarone  200 mg Oral BID   aspirin EC  81 mg Oral Daily   atorvastatin  80 mg Oral Daily   diltiazem  120 mg Oral Daily   docusate sodium  100 mg Oral BID   empagliflozin  10 mg Oral QAC breakfast   enoxaparin (LOVENOX) injection  40 mg Subcutaneous Q24H   ezetimibe  10 mg Oral Daily   famotidine  20 mg Oral Q breakfast   glimepiride  2 mg Oral QPC breakfast   ipratropium-albuterol  3 mL Inhalation QID   irbesartan  37.5 mg Oral Daily   isosorbide mononitrate  60 mg Oral Daily   loratadine  10 mg Oral QHS   metoprolol tartrate  50 mg Oral BID   montelukast  10 mg Oral QHS   pantoprazole  40 mg Oral Daily   potassium chloride  40 mEq Oral BID   predniSONE  30 mg Oral Q breakfast   sodium chloride  2 g Oral BID WC    Physical Exam: Vitals:   10/09/22 0000 10/09/22 0400 10/09/22 0700 10/09/22 0819  BP: 124/74 123/75 118/79   Pulse: 67 80 60   Resp: 20 18 20 18   Temp: 97.9 F (36.6 C) 97.7 F (36.5 C) 98 F  (36.7 C)   TempSrc: Oral Oral Oral   SpO2: 96% 96% 98% 98%  Weight:      Height:        GEN- NAD, A&O x 3, normal affect HEENT: Normocephalic, atraumatic Lungs- CTAB, Normal effort.  Heart- Regular rate and rhythm, No M/G/R.  GI- Soft, NT, ND.  Extremities- No clubbing, cyanosis, or edema   EKG:on arrival showed A pacing with very long 1st degree AV block (PPM programmed to reduced V pacing) and [phone] when measured manually (personally reviewed)  TELEMETRY: NSR with frequent PVCs, A pacing and intermittent V pacing (personally reviewed)  DEVICE HISTORY:  Medtronic Dual Chamber PPM implanted 2007 for SSS, chronotropic incompetence, gen change 2017   Assessment/Plan:  #AMS #Hyponatremia Unlikely that flecainide was contributing, but in setting of electrolyte imbalance and confusion flecainide will not be the best option for him moving forward.  Per primary team.   #Sick sinus syndrome s/p Medtronic PPM  Normal PPM function by industry check today, 10/09/2022.   #VT/NSVT #PVCs No VT on device check.  Continues to have frequent PVCs Will stop flecainide. Could potentially reconsider down the road pending his cardiac PET.  Start amiodarone 200 mg BID x 2 weeks, then 200 mg daily.   #COPD Not on O2 at home Suspect benefit of amio outweighs risk at this time.   For questions or updates, please contact [facility] Please consult [email or web address] for contact info under Cardiology/STEMI.  [name] Flock, PA-C  10/09/2022 11:16 AM

## 2022-10-09 NOTE — Evaluation (Signed)
Physical Therapy Evaluation Patient Details Name: [name] [name] MRN: [id number] DOB: [birth year] Today's Date: 10/09/2022  History of Present Illness  Pt is a 77 y.o. male who presented 10/08/22 with cough, SOB, AMS, L shoulder pain, and difficulty ambulating. CT head showed no acute intracranial pathology. Pt admitted with acute metabolic encephalopathy likely secondary to hyponatremia. PMH: COPD, T2DM, HTN, peripheral neuropathy, HFpEF, HLD, CKD, OSA, bradycardia s/p pacemaker, carpal tunnel syndrome bil   Clinical Impression  Pt presents with condition above and deficits mentioned below, see PT Problem List. PTA, he was independent without DME, living alone in a mobile home with 4 STE. His granddaughter lives nearby and can provide 24/7 care if needed. Currently, pt is displaying deficits in balance, activity tolerance, and cognition. He required minA for transfers and min guard-minA to ambulate without UE support, min guard when utilizing a RW. Pt is already rapidly improving per his granddaughter, thus I anticipated pt will continue to progress back to his baseline fairly quickly. Therefore, he likely will not need PT follow-up at d/c. Will continue to follow acutely.    HR ranged from 40s-100s  SpO2 >/= 93% on RA throughout  Orthostatic Vitals -  130/76 (92) & 67 bpm supine 115/79 (92) & 85 bpm sitting 129/79 (92) & 82 bpm standing 113/76 (89) & 54 bpm standing ~3 min    Recommendations for follow up therapy are one component of a multi-disciplinary discharge planning process, led by the attending physician.  Recommendations may be updated based on patient status, additional functional criteria and insurance authorization.  Follow Up Recommendations       Assistance Recommended at Discharge Intermittent Supervision/Assistance  Patient can return home with the following  A little help with walking and/or transfers;A little help with bathing/dressing/bathroom;Assistance with  cooking/housework;Direct supervision/assist for medications management;Direct supervision/assist for financial management;Assist for transportation;Help with stairs or ramp for entrance    Equipment Recommendations None recommended by PT  Recommendations for Other Services       Functional Status Assessment Patient has had a recent decline in their functional status and demonstrates the ability to make significant improvements in function in a reasonable and predictable amount of time.     Precautions / Restrictions Precautions Precautions: Fall Restrictions Weight Bearing Restrictions: No      Mobility  Bed Mobility Overal bed mobility: Needs Assistance Bed Mobility: Supine to Sit, Sit to Supine     Supine to sit: Min guard, HOB elevated Sit to supine: Min guard, HOB elevated   General bed mobility comments: Min guard for safety, HOB elevated    Transfers Overall transfer level: Needs assistance Equipment used: Rolling walker (2 wheels) Transfers: Sit to/from Stand Sit to Stand: Min assist           General transfer comment: Posterior sway noted, needing light minA for stability.    Ambulation/Gait Ambulation/Gait assistance: Min guard, Min assist Gait Distance (Feet): 180 Feet Assistive device: Rolling walker (2 wheels), None Gait Pattern/deviations: Step-through pattern, Decreased stride length, Drifts right/left Gait velocity: reduced Gait velocity interpretation: 1.31 - 2.62 ft/sec, indicative of limited community ambulator   General Gait Details: Ambulates steadily with RW, no LOB, min guard for safety and cues to keep RW on ground. MinA to regain balance during lateral drifting when cued to turn head L <> R and up <> down when ambulating without UE support. Fairly steady but mild sway noted when ambulating with head forward without UE support, min guard  Stairs  Wheelchair Mobility    Modified Rankin (Stroke Patients Only) Modified  Rankin (Stroke Patients Only) Pre-Morbid Rankin Score: No symptoms Modified Rankin: Moderate disability     Balance Overall balance assessment: Mild deficits observed, not formally tested                                           Pertinent Vitals/Pain Pain Assessment Pain Assessment: No/denies pain    Home Living Family/patient expects to be discharged to:: Private residence Living Arrangements: Alone Available Help at Discharge: Family;Available 24 hours/day Type of Home: Mobile home Home Access: Stairs to enter Entrance Stairs-Rails: Can reach both Entrance Stairs-Number of Steps: 4   Home Layout: One level Home Equipment: Cane - single point;Rollator (4 wheels)      Prior Function Prior Level of Function : Independent/Modified Independent;Driving             Mobility Comments: x1 fall leading up to admission, otherwise none; does not typically use AD but had been using SPC since onset of weakness       Hand Dominance   Dominant Hand: Right    Extremity/Trunk Assessment   Upper Extremity Assessment Upper Extremity Assessment: Defer to OT evaluation    Lower Extremity Assessment Lower Extremity Assessment: Overall WFL for tasks assessed (hx of peripheral neuropathy bil)    Cervical / Trunk Assessment Cervical / Trunk Assessment: Normal  Communication   Communication: No difficulties  Cognition Arousal/Alertness: Awake/alert Behavior During Therapy: WFL for tasks assessed/performed Overall Cognitive Status: Impaired/Different from baseline Area of Impairment: Attention, Problem solving                   Current Attention Level: Selective         Problem Solving: Slow processing General Comments: Slow processing at times, granddaughter reports his cognition has improved but is still not baseline        General Comments General comments (skin integrity, edema, etc.): HR ranged from 40s-100s, SpO2 >/= 93% on RA throughout;  Vitals - 130/76 (92) & 67 bpm supine, 115/79 (92) & 85 bpm sitting, 129/79 (92) & 82 bpm standing, 113/76 (89) & 54 bpm standing ~3 min; educated granddaughter to supervise pt at this time due to cog and balance deficits and recommend use of rollator if not supervision or if outside, they verbalized understanding    Exercises     Assessment/Plan    PT Assessment Patient needs continued PT services  PT Problem List Decreased activity tolerance;Decreased balance;Decreased mobility;Decreased cognition;Impaired sensation       PT Treatment Interventions DME instruction;Gait training;Stair training;Functional mobility training;Therapeutic activities;Therapeutic exercise;Balance training;Neuromuscular re-education;Patient/family education;Cognitive remediation    PT Goals (Current goals can be found in the Care Plan section)  Acute Rehab PT Goals Patient Stated Goal: to improve and go home PT Goal Formulation: With patient/family Time For Goal Achievement: 10/23/22 Potential to Achieve Goals: Good    Frequency Min 3X/week     Co-evaluation               AM-PAC PT "6 Clicks" Mobility  Outcome Measure Help needed turning from your back to your side while in a flat bed without using bedrails?: None Help needed moving from lying on your back to sitting on the side of a flat bed without using bedrails?: A Little Help needed moving to and from a bed to a chair (including a  wheelchair)?: A Little Help needed standing up from a chair using your arms (e.g., wheelchair or bedside chair)?: A Little Help needed to walk in hospital room?: A Little Help needed climbing 3-5 steps with a railing? : A Little 6 Click Score: 19    End of Session Equipment Utilized During Treatment: Gait belt Activity Tolerance: Patient tolerated treatment well Patient left: in bed;with call bell/phone within reach;with bed alarm set;with nursing/sitter in room;with family/visitor present Nurse Communication:  Mobility status PT Visit Diagnosis: Unsteadiness on feet (R26.81);Other abnormalities of gait and mobility (R26.89);History of falling (Z91.81);Difficulty in walking, not elsewhere classified (R26.2)    Time: 5573-2202 PT Time Calculation (min) (ACUTE ONLY): 28 min   Charges:   PT Evaluation $PT Eval Moderate Complexity: 1 Mod PT Treatments $Therapeutic Activity: 8-22 mins        Raymond Gurney, PT, DPT Acute Rehabilitation Services  Office: [phone]   Jewel Baize 10/09/2022, 11:39 AM

## 2022-10-09 NOTE — Progress Notes (Signed)
   10/09/22 2216  BiPAP/CPAP/SIPAP  $ Non-Invasive Home Ventilator  Subsequent  BiPAP/CPAP/SIPAP Pt Type Adult  Mask Type Full face mask  FiO2 (%) 21 %  Patient Home Equipment (S)  Yes   Patient brought in home unit

## 2022-10-09 NOTE — Progress Notes (Signed)
SATURATION QUALIFICATIONS: (This note is used to comply with regulatory documentation for home oxygen)  Patient Saturations on Room Air at Rest = 98%  Patient Saturations on Room Air while Ambulating = 93%   Please briefly explain why patient needs home oxygen: Pt does not require supplemental O2.   Raymond Gurney, PT, DPT Acute Rehabilitation Services  Office: [phone]

## 2022-10-10 DIAGNOSIS — G9341 Metabolic encephalopathy: Secondary | ICD-10-CM | POA: Diagnosis not present

## 2022-10-10 LAB — BASIC METABOLIC PANEL
Anion gap: 14 (ref 5–15)
BUN: 17 mg/dL (ref 8–23)
CO2: 22 mmol/L (ref 22–32)
Calcium: 8.7 mg/dL — ABNORMAL LOW (ref 8.9–10.3)
Chloride: 99 mmol/L (ref 98–111)
Creatinine, Ser: 1.16 mg/dL (ref 0.61–1.24)
GFR, Estimated: >60 mL/min (ref >60)
Glucose, Bld: 113 mg/dL — ABNORMAL HIGH (ref 70–99)
Potassium: 3.8 mmol/L (ref 3.5–5.1)
Sodium: 133 mmol/L — ABNORMAL LOW (ref 135–145)

## 2022-10-10 LAB — CBC
HCT: 40.3 % (ref 39.0–52.0)
Hemoglobin: 13.9 g/dL (ref 13.0–17.0)
MCH: 31.9 pg (ref 26.0–34.0)
MCHC: 34.5 g/dL (ref 30.0–36.0)
MCV: 92.4 fL (ref 80.0–100.0)
Platelets: 208 10*3/uL (ref 150–400)
RBC: 4.36 MIL/uL (ref 4.22–5.81)
RDW: 13.6 % (ref 11.5–15.5)
WBC: 13.2 10*3/uL — ABNORMAL HIGH (ref 4.0–10.5)
nRBC: 0 % (ref 0.0–0.2)

## 2022-10-10 LAB — MAGNESIUM: Magnesium: 2.4 mg/dL (ref 1.7–2.4)

## 2022-10-10 MED ORDER — PREDNISONE 10 MG PO TABS: 10.0000 mg | ORAL_TABLET | Freq: Every day | ORAL | 0 refills | Status: DC

## 2022-10-10 MED ORDER — FUROSEMIDE 40 MG PO TABS: ORAL_TABLET | ORAL | Status: DC

## 2022-10-10 MED ORDER — PREDNISONE 10 MG PO TABS: ORAL_TABLET | ORAL | 0 refills | Status: DC

## 2022-10-10 MED ORDER — SODIUM CHLORIDE 1 G PO TABS: 1.0000 g | ORAL_TABLET | Freq: Two times a day (BID) | ORAL | 0 refills | Status: DC

## 2022-10-10 NOTE — Progress Notes (Addendum)
  Dr. Rito Ehrlich let us know that pt refusing Amiodarone.    Discussed at length with pt and wife, who is present in room.  Concern for off target effects. They would prefer to leave him off either drug and follow up with Dr. Graciela Husbands as scheduled to discuss further.   If they change their mind about amiodarone, it can be started at anytime as an outpatient.    Discussed above with Dr. Lalla Brothers.  Follow up in and discussed with pt and family.   Casimiro Needle [address] [place], PA-C  10/10/2022 7:27 AM

## 2022-10-10 NOTE — Discharge Summary (Signed)
[facility]  Physician Discharge Summary   Patient ID: [name] [name] MRN: [id number] DOB/AGE: May 18, 1945 77 y.o.  Admit date: 10/08/2022 Discharge date: 10/10/2022    PCP: [name], MD  DISCHARGE DIAGNOSES:    Acute metabolic encephalopathy, resolved   COPD with acute exacerbation (HCC)   Prolonged Q-T interval on ECG   Pacemaker- DDD-Medtronic   Coronary artery disease of native artery of native heart with stable angina pectoris (HCC)   OSA (obstructive sleep apnea)   CKD (chronic kidney disease) stage 2, GFR 60-89 ml/min   Hyperlipidemia associated with type 2 diabetes mellitus (HCC)   GERD (gastroesophageal reflux disease)   Chronic heart failure with preserved ejection fraction (HCC)   RECOMMENDATIONS FOR OUTPATIENT FOLLOW UP: Please check sodium level in 1 week to determine further course of action regarding diuretics and salt tablets EP to arrange outpatient f/u    Home Health: None Equipment/Devices: None  CODE STATUS: Full code  DISCHARGE CONDITION: fair  Diet recommendation: As before  INITIAL HISTORY: 77 y.o. male with history h/o HTN, HLP, GERD, OSA, chronic back pain, diet-controlled diabetes, bradycardia s/p pacemaker, bilateral lower extremity arthritis/peripheral neuropathy, advanced COPD on chronic prednisone maintenance therapy brought in by family and concern for altered mental status.  Patient was recently prescribed oxycodone for neck pain but he took only 1 dose.  Noted to have low sodium levels which was thought to be one of the reasons for his altered mental status.  Could not undergo MRI due to noncompatible pacemaker.  Hospitalized for further management.       HOSPITAL COURSE:   Acute metabolic encephalopathy/ataxia/fall No obvious focal neurological deficits on examination.  CT head did not show any acute findings.  Cannot do MRI due to MRI incompatible pacemaker.  CT was repeated which did not show any acute findings.  Seen by  PT and OT.  No needs identified.   Low sodium level likely contributed to his encephalopathy.   Mentation has improved and could be close to baseline now.   Hyponatremia Could be due to SIADH based on urine osmolality and low uric acid level.  However his urine sodium level was also low.  He was given IV fluids. Sodium levels have improved.  Will be discharged on salt tablets.  I have told the patient and his daughter that his sodium level needed will need to be checked in a week's time.  Diuretics to be taken only as needed till then for significant weight gain.  Further management per PCP after repeat levels are obtained.   History of COPD with chronic hypercapnic respiratory failure Supposed to use BiPAP at night but he apparently is not very compliant. On chronic steroids at home.  Thought to have mild exacerbation on admission.  Chest x-ray did not show any acute findings.  Atelectasis was noted. Saturating normal on room air.  Does not use oxygen at home. Higher dose of steroids here in the hospital.  Will be discharged on tapering doses of steroids.  He was told to be compliant with BiPAP.   QT prolongation/ history of VT/NSVT Discussed with cardiology.  Flecainide was discontinued.  Amiodarone was offered to patient which he also refuses.  Seen again by cardiology today.  Okay to discharge without any of these medications for now.  They will arrange outpatient follow-up.  They did interrogate pacemaker.     Chronic diastolic CHF No significant pedal edema noted.  Diuretics were placed on hold due to hyponatremia.  Can be  taken as needed at home for now.  This can be further adjusted depending on sodium levels in the outpatient setting.   Obstructive sleep apnea On BiPAP at home as mentioned above.   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   Chronic back pain Holding his  Neurontin and Flexeril and other sedatives due to his encephalopathy.  No neurological deficits.   Obesity Estimated body mass index is 30.52 kg/m as calculated from the following:   Height as of this encounter: 6' (1.829 m).   Weight as of this encounter: 102.1 kg.  Patient is stable.  Okay for discharge home today.  Discussed with his daughter.    PERTINENT LABS:  The results of significant diagnostics from this hospitalization (including imaging, microbiology, ancillary and laboratory) are listed below for reference.    Microbiology: Recent Results (from the past 240 hour(s))  Resp panel by RT-PCR (RSV, Flu A&B, Covid) Anterior Nasal Swab     Status: None   Collection Time: 10/08/22 10:33 AM   Specimen: Anterior Nasal Swab  Result Value Ref Range Status   SARS Coronavirus 2 by RT PCR NEGATIVE NEGATIVE Final   Influenza A by PCR NEGATIVE NEGATIVE Final   Influenza B by PCR NEGATIVE NEGATIVE Final    Comment: (NOTE) The Xpert Xpress SARS-CoV-2/FLU/RSV plus assay is intended as an aid in the diagnosis of influenza from Nasopharyngeal swab specimens and should not be used as a sole basis for treatment. Nasal washings and aspirates are unacceptable for Xpert Xpress SARS-CoV-2/FLU/RSV testing.  Fact Sheet for Patients: BloggerCourse.com  Fact Sheet for Healthcare Providers: SeriousBroker.it  This test is not yet approved or cleared by the [place] FDA and has been authorized for detection and/or diagnosis of SARS-CoV-2 by FDA under an Emergency Use Authorization (EUA). This EUA will remain in effect (meaning this test can be used) for the duration of the COVID-19 declaration under Section 564(b)(1) of the Act, 21 U.S.C. section 360bbb-3(b)(1), unless the authorization is terminated or revoked.     Resp Syncytial Virus by PCR NEGATIVE NEGATIVE Final    Comment: (NOTE) Fact Sheet for  Patients: BloggerCourse.com  Fact Sheet for Healthcare Providers: SeriousBroker.it  This test is not yet approved or cleared by the [place] FDA and has been authorized for detection and/or diagnosis of SARS-CoV-2 by FDA under an Emergency Use Authorization (EUA). This EUA will remain in effect (meaning this test can be used) for the duration of the COVID-19 declaration under Section 564(b)(1) of the Act, 21 U.S.C. section 360bbb-3(b)(1), unless the authorization is terminated or revoked.  Performed at [facility], [address], [place], [place] [address]      Labs:   Basic Metabolic Panel: Recent Labs  Lab 10/08/22 1040 10/08/22 1107 10/08/22 1826 10/08/22 2129 10/09/22 0846 10/10/22 0753  NA 124* 126*  --   --  126* 133*  K 3.4* 3.5  --   --  3.2* 3.8  CL 93*  --   --   --  93* 99  CO2 19*  --   --   --  23 22  GLUCOSE 148*  --   --   --  159* 113*  BUN 18  --   --   --  19 17  CREATININE 1.03  --  1.27*  --  1.02 1.16  CALCIUM 8.2*  --   --   --  8.3* 8.7*  MG  --   --   --  1.9  --  2.4  PHOS  --   --   --  3.6  --   --    Liver Function Tests: Recent Labs  Lab 10/08/22 1040  AST 29  ALT 22  ALKPHOS 54  BILITOT 0.9  PROT 6.6  ALBUMIN 3.4*    CBC: Recent Labs  Lab 10/08/22 1040 10/08/22 1107 10/08/22 1826 10/10/22 0753  WBC 10.0  --  11.0* 13.2*  NEUTROABS 6.4  --   --   --   HGB 15.3 16.0 14.4 13.9  HCT 44.5 47.0 43.3 40.3  MCV 91.8  --  91.5 92.4  PLT 206  --  200 208    BNP (last 3 results) Recent Labs    12/16/21 2350 09/29/22 1025  BNP 65.0 50.1      CBG: Recent Labs  Lab 10/08/22 1124 10/08/22 1859  GLUCAP 133* 234*     IMAGING STUDIES CT HEAD WO CONTRAST ( )  Result Date: 10/09/2022 CLINICAL DATA:  Mental status change, unknown cause. EXAM: CT HEAD WITHOUT CONTRAST TECHNIQUE: Contiguous axial images were obtained from the base of the skull through the  vertex without intravenous contrast. RADIATION DOSE REDUCTION: This exam was performed according to the departmental dose-optimization program which includes automated exposure control, adjustment of the mA and/or kV according to patient size and/or use of iterative reconstruction technique. COMPARISON:  Head CT 10/08/2022. FINDINGS: Brain: No acute intracranial hemorrhage. Stable mild chronic small vessel disease. Gray-white differentiation is otherwise preserved. No hydrocephalus or extra-axial collection. No mass effect or midline shift. Vascular: No hyperdense vessel or unexpected calcification. Skull: No calvarial fracture or suspicious bone lesion. Skull base is unremarkable. Sinuses/Orbits: Mild paranasal sinus disease. Orbits are unremarkable. Mastoids are well aerated. Other: None. IMPRESSION: 1. No acute intracranial abnormality. 2. Stable mild chronic small vessel disease. Electronically Signed   By: Orvan Falconer M.D.   On: 10/09/2022 13:23   CT HEAD WO CONTRAST  Result Date: 10/08/2022 CLINICAL DATA:  Altered mental status EXAM: CT HEAD WITHOUT CONTRAST TECHNIQUE: Contiguous axial images were obtained from the base of the skull through the vertex without intravenous contrast. RADIATION DOSE REDUCTION: This exam was performed according to the departmental dose-optimization program which includes automated exposure control, adjustment of the mA and/or kV according to patient size and/or use of iterative reconstruction technique. COMPARISON:  CT head 09/29/2022. FINDINGS: Brain: There is no evidence of acute intracranial hemorrhage, extra-axial fluid collection, or acute infarct. Parenchymal volume is normal. The ventricles are normal in size. Gray-white differentiation is preserved. Patchy hypodensity in the supratentorial white matter is nonspecific but likely reflects sequela of chronic small-vessel ischemic change. The pituitary and suprasellar region are normal. There is no mass lesion. There is  no mass effect or midline shift. Vascular: No hyperdense vessel or unexpected calcification. Skull: Normal. Negative for fracture or focal lesion. Sinuses/Orbits: There is mild mucosal thickening in the paranasal sinuses. Bilateral lens implants are in place. The globes and orbits are otherwise unremarkable. Other: None. IMPRESSION: Stable noncontrast head CT with no acute intracranial pathology. Electronically Signed   By: Lesia Hausen M.D.   On: 10/08/2022 12:47   DG Chest 2 View  Result Date: 10/08/2022 CLINICAL DATA:  Cough and shortness of breath EXAM: CHEST - 2 VIEW COMPARISON:  09/29/2022 FINDINGS: Left upper chest pacemaker battery pack with leads along the right side of the heart. Stable cardiopericardial silhouette. Tortuous aorta. No  pneumothorax, effusion or edema. Minimal linear opacity left lung base likely scar or atelectasis. Air-fluid level along the stomach beneath the left hemidiaphragm. Films are under penetrated IMPRESSION: Under penetrated radiographs. Left upper chest pacemaker. Minimal opacity left lung base likely scar or atelectasis Electronically Signed   By: Karen Kays M.D.   On: 10/08/2022 11:11    DISCHARGE EXAMINATION: Vitals:   10/09/22 1950 10/09/22 2315 10/10/22 0325 10/10/22 0737  BP: 128/80 131/81 (!) 156/73 (!) 164/97  Pulse: 79 89 70 71  Resp: 17 18 18    Temp: 98 F (36.7 C) 97.8 F (36.6 C) 97.8 F (36.6 C) 98.3 F (36.8 C)  TempSrc: Oral Oral Oral Oral  SpO2: 95% 93% 93% 95%  Weight:      Height:       General appearance: Awake alert.  In no distress Resp: Clear to auscultation bilaterally.  Normal effort Cardio: S1-S2 is normal regular.  No S3-S4.  No rubs murmurs or bruit GI: Abdomen is soft.  Nontender nondistended.  Bowel sounds are present normal.  No masses organomegaly   DISPOSITION: Home   Discharge Instructions     (HEART FAILURE PATIENTS) Call MD:  Anytime you have any of the following symptoms: 1) 3 pound weight gain in 24 hours or  5 pounds in 1 week 2) shortness of breath, with or without a dry hacking cough 3) swelling in the hands, feet or stomach 4) if you have to sleep on extra pillows at night in order to breathe.   Complete by: As directed    Call MD for:  difficulty breathing, headache or visual disturbances   Complete by: As directed    Call MD for:  extreme fatigue   Complete by: As directed    Call MD for:  persistant dizziness or light-headedness   Complete by: As directed    Call MD for:  persistant nausea and vomiting   Complete by: As directed    Call MD for:  severe uncontrolled pain   Complete by: As directed    Call MD for:  temperature >100.4   Complete by: As directed    Diet - low sodium heart healthy   Complete by: As directed    Discharge instructions   Complete by: As directed    Please take your medications as prescribed.  Please be sure to follow-up with your primary care provider within 1 week to recheck your sodium levels.  Hold your diuretic for now and take only if you have significant weight gain as instructed.  You were cared for by a hospitalist during your hospital stay. If you have any questions about your discharge medications or the care you received while you were in the hospital after you are discharged, you can call the unit and asked to speak with the hospitalist on call if the hospitalist that took care of you is not available. Once you are discharged, your primary care physician will handle any further medical issues. Please note that NO REFILLS for any discharge medications will be authorized once you are discharged, as it is imperative that you return to your primary care physician (or establish a relationship with a primary care physician if you do not have one) for your aftercare needs so that they can reassess your need for medications and monitor your lab values. If you do not have a primary care physician, you can call [phone] for a physician referral.   Increase activity  slowly   Complete by: As  directed          Allergies as of 10/10/2022       Reactions   Penicillins Anaphylaxis   Codeine Rash        Medication List     STOP taking these medications    doxycycline 100 MG tablet Commonly known as: VIBRA-TABS   flecainide 100 MG tablet Commonly known as: TAMBOCOR   lidocaine 5 % Commonly known as: Lidoderm   oxyCODONE 5 MG immediate release tablet Commonly known as: Roxicodone       TAKE these medications    albuterol 108 (90 Base) MCG/ACT inhaler Commonly known as: VENTOLIN HFA Inhale 2 puffs into the lungs every 6 (six) hours as needed for wheezing or shortness of breath.   aspirin 81 MG tablet Take 81 mg by mouth daily.   atorvastatin 80 MG tablet Commonly known as: LIPITOR TAKE 1 TABLET BY MOUTH ONCE  DAILY   CALCIUM-MAGNESIUM-ZINC PO Take 1 tablet by mouth daily.   cyclobenzaprine 10 MG tablet Commonly known as: FLEXERIL Take 1 tablet (10 mg total) by mouth 2 (two) times daily as needed for muscle spasms.   diltiazem 120 MG 12 hr capsule Commonly known as: CARDIZEM SR Take 1 capsule (120 mg total) by mouth daily. What changed: when to take this   docusate sodium 100 MG capsule Commonly known as: COLACE Take 100 mg by mouth daily.   empagliflozin 10 MG Tabs tablet Commonly known as: Jardiance Take 1 tablet (10 mg total) by mouth daily before breakfast.   ezetimibe 10 MG tablet Commonly known as: ZETIA Take 1 tablet by mouth once daily What changed: when to take this   famotidine 20 MG tablet Commonly known as: PEPCID TAKE ONE TABLET BY MOUTH ONCE DAILY AT BEDTIME What changed: when to take this   Flutter Devi Blow through 4 times per set, 3 sets per day when needed to clear lungs   furosemide 40 MG tablet Commonly known as: LASIX Take as needed if there is a weight gain of more than 2 pounds over 2 to 3 days. What changed:  how much to take how to take this when to take this additional  instructions Another medication with the same name was removed. Continue taking this medication, and follow the directions you see here.   gabapentin 300 MG capsule Commonly known as: NEURONTIN Take 300 mg by mouth 2 (two) times daily.   glimepiride 2 MG tablet Commonly known as: AMARYL Take 2 mg by mouth daily with breakfast.   ipratropium-albuterol 0.5-2.5 (3) MG/3ML Soln Commonly known as: DUONEB Inhale 3 mLs into the lungs every 6 (six) hours as needed (wheezing or shortness of breath).   isosorbide mononitrate 60 MG 24 hr tablet Commonly known as: IMDUR Take 1 tablet (60 mg total) by mouth daily.   levocetirizine 5 MG tablet Commonly known as: XYZAL Take 5 mg at bedtime by mouth.   metoprolol tartrate 50 MG tablet Commonly known as: LOPRESSOR Take 1 tablet (50 mg total) by mouth 2 (two) times daily.   montelukast 10 MG tablet Commonly known as: SINGULAIR Take 10 mg by mouth daily.   Mucinex Maximum Strength 1200 MG Tb12 Generic drug: Guaifenesin Take 1,200 mg by mouth 2 (two) times daily.   multivitamin with minerals Tabs tablet Take 1 tablet by mouth every other day.   nitroGLYCERIN 0.4 MG SL tablet Commonly known as: NITROSTAT DISSOLVE ONE TABLET UNDER THE TONGUE EVERY 5 MINUTES AS NEEDED FOR CHEST PAIN.  DO NOT EXCEED A TOTAL OF 3 DOSES IN 15 MINUTES What changed: See the new instructions.   olmesartan 20 MG tablet Commonly known as: BENICAR Take 10 mg by mouth daily.   pantoprazole 40 MG tablet Commonly known as: PROTONIX TAKE ONE TABLET BY MOUTH ONCE DAILY TAKE  30-60  MINUTES  BEFORE  FIRST  MEAL  OF  THE  DAY What changed: See the new instructions.   predniSONE 10 MG tablet Commonly known as: DELTASONE Take 1 tablet (10 mg total) by mouth daily with breakfast. Resume only after you have completed the following dose: 30 mg once daily for 7 days followed by 20 mg once daily for 7 days. What changed:  how much to take how to take this when to take  this additional instructions   predniSONE 10 MG tablet Commonly known as: DELTASONE Take 3 tablets once daily for 7 days followed by 2 tablets once daily for 7 days and then resume 1 tablet once daily as before. What changed: You were already taking a medication with the same name, and this prescription was added. Make sure you understand how and when to take each.   sodium chloride 1 g tablet Take 1 tablet (1 g total) by mouth 2 (two) times daily with a meal.   Trelegy Ellipta 100-62.5-25 MCG/ACT Aepb Generic drug: Fluticasone-Umeclidin-Vilant USE 1 INHALATION BY MOUTH DAILY What changed: See the new instructions.          Follow-up Information     [name], MD. Schedule an appointment as soon as possible for a visit in 1 week(s).   Specialty: Family Medicine Why: post hospitalization follow up and to recheck sodium levels Contact information: [address] [employer] [address] [place] [address] [phone]                 TOTAL DISCHARGE TIME:  35 minutes  Herminio Kniskern Rito Ehrlich  [facility] Pager on [email or web address]  10/11/2022, 10:29 AM

## 2022-10-10 NOTE — TOC Transition Note (Signed)
Transition of Care [facility]) - CM/SW Discharge Note   Patient Details  Name: [name] [name] MRN: [id number] Date of Birth: 1946/03/06  Transition of Care [facility]) CM/SW Contact:  Ronny Bacon, RN Phone Number: 10/10/2022, 9:47 AM   Clinical Narrative:  Spoke with patient at bedside. Patient is set for discharge home today with self care. Patient has family near by to assist with needs as needed. Family will transport patient home.     Final next level of care: Home/Self Care Barriers to Discharge: No Barriers Identified   Patient Goals and CMS Choice      Discharge Placement                         Discharge Plan and Services Additional resources added to the After Visit Summary for     Discharge Planning Services: CM Consult                                 Social Determinants of Health (SDOH) Interventions SDOH Screenings   Food Insecurity: No Food Insecurity (10/08/2022)  Housing: Low Risk  (10/08/2022)  Transportation Needs: No Transportation Needs (10/08/2022)  Utilities: Not At Risk (10/08/2022)  Depression (PHQ2-9): Low Risk  (02/11/2022)  Tobacco Use: Medium Risk (10/08/2022)     Readmission Risk Interventions     No data to display

## 2022-10-16 ENCOUNTER — Ambulatory Visit: Payer: 59 | Admitting: Physician Assistant

## 2022-10-22 DIAGNOSIS — E871 Hypo-osmolality and hyponatremia: Secondary | ICD-10-CM | POA: Diagnosis not present

## 2022-10-22 DIAGNOSIS — J449 Chronic obstructive pulmonary disease, unspecified: Secondary | ICD-10-CM | POA: Diagnosis not present

## 2022-10-24 ENCOUNTER — Encounter (HOSPITAL_COMMUNITY): Payer: Self-pay

## 2022-10-28 ENCOUNTER — Ambulatory Visit (HOSPITAL_COMMUNITY)
Admission: RE | Admit: 2022-10-28 | Discharge: 2022-10-28 | Disposition: A | Discharge status: Home / Self Care | Payer: 59 | Source: Ambulatory Visit | Attending: Internal Medicine | Admitting: Internal Medicine

## 2022-10-28 DIAGNOSIS — I25118 Atherosclerotic heart disease of native coronary artery with other forms of angina pectoris: Secondary | ICD-10-CM | POA: Diagnosis not present

## 2022-10-28 DIAGNOSIS — Z0189 Encounter for other specified special examinations: Secondary | ICD-10-CM | POA: Diagnosis not present

## 2022-10-28 LAB — NM PET CT CARDIAC PERFUSION MULTI W/ABSOLUTE BLOODFLOW
LV dias vol: 108 mL (ref 62–150)
LV sys vol: 52 mL
MBFR: 2.7
Nuc Rest EF: 52 %
Nuc Stress EF: 53 %
Rest MBF: 0.76 ml/g/min
Rest Nuclear Isotope Dose: 26.7 mCi
ST Depression (mm): 0 mm
Stress MBF: 2.05 ml/g/min
Stress Nuclear Isotope Dose: 26.4 mCi

## 2022-10-28 MED ORDER — REGADENOSON 0.4 MG/5ML IV SOLN
INTRAVENOUS | Status: AC
  Filled 2022-10-28: qty 5

## 2022-10-28 MED ORDER — RUBIDIUM RB82 GENERATOR (RUBYFILL)
26.6600 | PACK | Freq: Once | INTRAVENOUS | Status: AC
  Administered 2022-10-28: 26.66 via INTRAVENOUS

## 2022-10-28 MED ORDER — REGADENOSON 0.4 MG/5ML IV SOLN
0.4000 mg | Freq: Once | INTRAVENOUS | Status: AC
  Administered 2022-10-28: 0.4 mg via INTRAVENOUS

## 2022-10-28 MED ORDER — RUBIDIUM RB82 GENERATOR (RUBYFILL)
26.3700 | PACK | Freq: Once | INTRAVENOUS | Status: AC
  Administered 2022-10-28: 26.37 via INTRAVENOUS

## 2022-10-28 NOTE — Progress Notes (Signed)
Patient presents for a cardiac PET stress test and tolerated procedure without incident. Patient maintained acceptable vital signs throughout the test and was offered caffeine after test.  Patient ambulated out of department with a steady gait.  

## 2022-10-31 ENCOUNTER — Telehealth: Payer: Self-pay | Admitting: Internal Medicine

## 2022-10-31 ENCOUNTER — Encounter: Payer: Self-pay | Admitting: Nurse Practitioner

## 2022-10-31 ENCOUNTER — Other Ambulatory Visit: Payer: Self-pay

## 2022-10-31 DIAGNOSIS — G4733 Obstructive sleep apnea (adult) (pediatric): Secondary | ICD-10-CM

## 2022-10-31 NOTE — Progress Notes (Signed)
10/31/2022 After Hours Call: [name] stated [facility] never received order for new BiPAP supplies. Verbal order provided. Will need to send new fax Monday once the office has reopened.

## 2022-10-31 NOTE — Telephone Encounter (Signed)
Pt calling in to get script faxed over to [facility] , they stated they have no rcvd it

## 2022-10-31 NOTE — Telephone Encounter (Signed)
Script was sent over to [facility]  for a new mask and  supplies.    Left a voicemail to let patient know that script was sent over.   Nothing else further needed.

## 2022-10-31 NOTE — Telephone Encounter (Signed)
PT is at [facility] now and wants [place] to fax in a RX for a new mask for his CPAP. He states it is a large. PT lives in [place] and he was hoping he would not have to make the long drive back. Is there anyway we can send this in right away for him today?

## 2022-11-03 ENCOUNTER — Other Ambulatory Visit: Payer: Self-pay

## 2022-11-03 DIAGNOSIS — G4733 Obstructive sleep apnea (adult) (pediatric): Secondary | ICD-10-CM

## 2022-11-04 ENCOUNTER — Encounter: Payer: Self-pay | Admitting: Adult Health

## 2022-11-04 ENCOUNTER — Ambulatory Visit (INDEPENDENT_AMBULATORY_CARE_PROVIDER_SITE_OTHER): Payer: 59

## 2022-11-04 ENCOUNTER — Ambulatory Visit (INDEPENDENT_AMBULATORY_CARE_PROVIDER_SITE_OTHER): Payer: 59 | Admitting: Adult Health

## 2022-11-04 ENCOUNTER — Other Ambulatory Visit: Payer: Self-pay | Admitting: *Deleted

## 2022-11-04 VITALS — BP 100/56 | HR 74 | Temp 98.8°F | Ht 72.0 in | Wt 222.0 lb

## 2022-11-04 DIAGNOSIS — R4182 Altered mental status, unspecified: Secondary | ICD-10-CM

## 2022-11-04 DIAGNOSIS — J441 Chronic obstructive pulmonary disease with (acute) exacerbation: Secondary | ICD-10-CM | POA: Diagnosis not present

## 2022-11-04 DIAGNOSIS — J449 Chronic obstructive pulmonary disease, unspecified: Secondary | ICD-10-CM | POA: Diagnosis not present

## 2022-11-04 DIAGNOSIS — E871 Hypo-osmolality and hyponatremia: Secondary | ICD-10-CM

## 2022-11-04 DIAGNOSIS — R918 Other nonspecific abnormal finding of lung field: Secondary | ICD-10-CM | POA: Diagnosis not present

## 2022-11-04 DIAGNOSIS — R0600 Dyspnea, unspecified: Secondary | ICD-10-CM

## 2022-11-04 DIAGNOSIS — R5381 Other malaise: Secondary | ICD-10-CM

## 2022-11-04 DIAGNOSIS — R7989 Other specified abnormal findings of blood chemistry: Secondary | ICD-10-CM

## 2022-11-04 DIAGNOSIS — J4489 Other specified chronic obstructive pulmonary disease: Secondary | ICD-10-CM

## 2022-11-04 DIAGNOSIS — R051 Acute cough: Secondary | ICD-10-CM | POA: Diagnosis not present

## 2022-11-04 LAB — BASIC METABOLIC PANEL
BUN: 18 mg/dL (ref 6–23)
CO2: 23 mEq/L (ref 19–32)
Calcium: 8.4 mg/dL (ref 8.4–10.5)
Chloride: 106 mEq/L (ref 96–112)
Creatinine, Ser: 1.15 mg/dL (ref 0.40–1.50)
GFR: 60.63 mL/min (ref >60.00)
Glucose, Bld: 89 mg/dL (ref 70–99)
Potassium: 3.8 mEq/L (ref 3.5–5.1)
Sodium: 136 mEq/L (ref 135–145)

## 2022-11-04 LAB — CBC WITH DIFFERENTIAL/PLATELET
Basophils Absolute: 0.1 10*3/uL (ref 0.0–0.1)
Basophils Relative: 1.3 % (ref 0.0–3.0)
Eosinophils Absolute: 0.3 10*3/uL (ref 0.0–0.7)
Eosinophils Relative: 3.7 % (ref 0.0–5.0)
HCT: 39.5 % (ref 39.0–52.0)
Hemoglobin: 13.1 g/dL (ref 13.0–17.0)
Lymphocytes Relative: 15.5 % (ref 12.0–46.0)
Lymphs Abs: 1.3 10*3/uL (ref 0.7–4.0)
MCHC: 33.1 g/dL (ref 30.0–36.0)
MCV: 93.7 fl (ref 78.0–100.0)
Monocytes Absolute: 1.3 10*3/uL — ABNORMAL HIGH (ref 0.1–1.0)
Monocytes Relative: 16.3 % — ABNORMAL HIGH (ref 3.0–12.0)
Neutro Abs: 5.1 10*3/uL (ref 1.4–7.7)
Neutrophils Relative %: 63.2 % (ref 43.0–77.0)
Platelets: 168 10*3/uL (ref 150.0–400.0)
RBC: 4.22 Mil/uL (ref 4.22–5.81)
RDW: 14.5 % (ref 11.5–15.5)
WBC: 8.1 10*3/uL (ref 4.0–10.5)

## 2022-11-04 LAB — D-DIMER, QUANTITATIVE: D-Dimer, Quant: 1.15 mcg/mL FEU — ABNORMAL HIGH (ref <0.50)

## 2022-11-04 LAB — BRAIN NATRIURETIC PEPTIDE: Pro B Natriuretic peptide (BNP): 256 pg/mL — ABNORMAL HIGH (ref 0.0–100.0)

## 2022-11-04 MED ORDER — BENZONATATE 200 MG PO CAPS: 200.0000 mg | ORAL_CAPSULE | Freq: Three times a day (TID) | ORAL | 3 refills | Status: DC | PRN

## 2022-11-04 MED ORDER — PREDNISONE 10 MG PO TABS: ORAL_TABLET | ORAL | 0 refills | Status: DC

## 2022-11-04 MED ORDER — DOXYCYCLINE HYCLATE 100 MG PO TABS
100.0000 mg | ORAL_TABLET | Freq: Two times a day (BID) | ORAL | 0 refills | Status: DC
Start: 2022-11-04 — End: 2023-03-20

## 2022-11-04 NOTE — Progress Notes (Signed)
Called and spoke with patient's daughter (DPR), she verbalized understanding.  Nothing further needed.

## 2022-11-04 NOTE — Progress Notes (Signed)
Called and spoke with patient's daughter (DPR), she verbalized understanding.  Advised that once the CTA is approved through his insurance she will receive a call to schedule scan.  Nothing further needed.

## 2022-11-04 NOTE — Assessment & Plan Note (Signed)
Recent hospitalization for altered mental status.  Encouraged to use caution with sedating medications.  Would avoid pain medications if possible

## 2022-11-04 NOTE — Assessment & Plan Note (Addendum)
Acute COPD exacerbation-patient is high risk for decompensation.  He is very weak.  Recent hospitalization.  He lives alone.  Recommended hospitalization for further evaluation and treatment .  Long discussion with patient and daughter.  They decline at this time despite discussion of potential complications of worsening COPD and acute on chronic respiratory failure Patient's symptom burden is disproportionate to level on COPD with previous PFTs being normal.  Will repeat PFTs when able. Check lab work including D-dimer and BNP.  If D-dimer is elevated will need a CT chest angio to rule out VTE. For now treat with empiric antibiotics and steroids.-  Will treat with a prolonged steroid taper and close follow-up. Advised patient and daughter that if patient's condition is not improving or worsens he will need to go to the emergency room for evaluation of hospital admission  Plan  Patient Instructions  Doxycycline 100mg  Twice daily  for 1 week, wear sunscreen. Avoid extreme heat and sun exposure .  Prednisone taper over next week.  Continue on Trelegy 1 puff daily, rinse after use.  Mucinex Twice daily  As needed  cough /congestion  Delsym 2 tsp Twice daily  for cough As needed   Tessalon Three times a day  for cough As needed   Continue on BIPAP At bedtime , wear all night long for at least 6hrs or more and can use during daytime as needed and with naps.  Flutter valve Twice daily   Albuterol inhaler or Duoneb As needed   Activity as tolerated.  Continue on Singulair daily  Chest xray and labs today .  Use caution with sedating medications  Follow up with Dr. Maple Hudson or Dequann Vandervelden NP in 1 week and As needed   Please contact office for sooner follow up if symptoms do not improve or worsen or seek emergency care

## 2022-11-04 NOTE — Patient Instructions (Addendum)
Doxycycline 100mg  Twice daily  for 1 week, wear sunscreen. Avoid extreme heat and sun exposure .  Prednisone taper over next week.  Continue on Trelegy 1 puff daily, rinse after use.  Mucinex Twice daily  As needed  cough /congestion  Delsym 2 tsp Twice daily  for cough As needed   Tessalon Three times a day  for cough As needed   Continue on BIPAP At bedtime , wear all night long for at least 6hrs or more and can use during daytime as needed and with naps.  Flutter valve Twice daily   Albuterol inhaler or Duoneb As needed   Activity as tolerated.  Continue on Singulair daily  Chest xray and labs today .  Use caution with sedating medications  Follow up with Dr. Maple Hudson or Samreen Seltzer NP in 1 week and As needed   Please contact office for sooner follow up if symptoms do not improve or worsen or seek emergency care

## 2022-11-04 NOTE — Progress Notes (Signed)
@Patient  ID: [name] [name], male    DOB: August 14, 1945, 77 y.o.   MRN: [id number]  Chief Complaint  Patient presents with   Acute Visit    Referring provider: Noberto Retort, MD  HPI: 77 year old male former smoker followed for COPD and obstructive sleep apnea Medical history significant for coronary artery disease, congestive heart failure, cardiac arrhythmia status post pacemaker/ICD  TEST/EVENTS :  Office Spirometry 08/11/14-moderate obstruction/restriction-FVC 2.95/62%, FEV1 2.37/65%, ratio 0.79, FEF 25-75% 2.43/77% PFT 10/26/2017-normal flows without response to bronchodilator, moderate diffusion defect. Walk Test O2 Qualifying-04/26/2018-qualified for portable oxygen-during his second lap heart rate reached 112 and saturation fell to 86% on room air Labs 04/26/2018- IgE 464, EOS wnl HST 05/17/18>> Severe obstructive sleep apnea AHI:33 CPAP to BIPAP titration 02/18/2019-  BIPAP 10/6, if not tolerated try CPAP 7   11/04/2022 Acute OV : COPD  Patient presents for an acute office visit. Complains of 1 week of increased cough, congestion, wheezing, short of breath , and increased weakness.  He denies any hemoptysis, chest pain, abdominal pain nausea vomiting or diarrhea.  He remains on Trelegy inhaler daily.  Has albuterol inhaler and DuoNebs at home to use as needed.  He is on chronic steroids with prednisone 10 mg daily.  Has recently went up on his prednisone to 30 mg daily.   has severe coughing paroxysms. Patient was admitted earlier last month for acute metabolic encephalopathy and hyponatremia.  CT head was negative for acute process.  Patient improved with IV fluids.  He was also discharged on salt tablets.  Diuretic regimen was decreased. Patient has obstructive sleep apnea and is on nocturnal BiPAP.  Patient says he wears his BiPAP every single night.  Patient is not on any oxygen.  BiPAP download shows excellent compliance at 97% usage.  Daily average usage of 5 hours.  Patient is  on IPAP 10 and EPAP 6 cm H2O.  AHI 2.8/hour. Patient lives alone.  Family lives nearby.  Family members are his caregivers. Patient does have chronic pain medicines at home.  He uses intermittently.  Long discussion with patient and family member today regarding sedating medications Recently seen by cardiology with PET/CT showing normal LV perfusion.  No evidence of ischemia.  EF 52%.  Study was considered normal and low risk.  Allergies  Allergen Reactions   Penicillins Anaphylaxis   Codeine Rash    Immunization History  Administered Date(s) Administered   Fluad Quad(high Dose 65+) 01/27/2019, 03/12/2020   Influenza Split 01/17/2008, 02/09/2009, 01/21/2010, 02/02/2014, 02/10/2017, 02/24/2017   Influenza, High Dose Seasonal PF 02/07/2011, 02/05/2012, 01/04/2014, 05/28/2015, 02/01/2016, 02/04/2017, 01/17/2018   Influenza,inj,Quad PF,6+ Mos 01/27/2013, 05/07/2015   Moderna Sars-Covid-2 Vaccination 05/28/2019, 06/28/2019, 02/27/2020   Pneumococcal Conjugate-13 08/25/2014   Pneumococcal Polysaccharide-23 09/09/2011, 09/25/2017   Pneumococcal-Unspecified 02/02/2014   Td 08/05/2006   Tdap 08/05/2006    Past Medical History:  Diagnosis Date   Arthritis    Bradycardia    pacemaker - Medtronic Adapta #[id number] - December 22, 2005   Carpal tunnel syndrome on both sides    Carpal tunnel syndrome, bilateral    Chronic back pain    Chronic kidney disease    COPD (chronic obstructive pulmonary disease) (HCC)    Depression    Diabetes mellitus    type II controled with diet   GERD (gastroesophageal reflux disease)    Headache(784.0)    Hyperlipidemia    Hypertension    Hypertensive retinopathy    Insomnia    Neuromuscular disorder (HCC)  peripheral neuropathy BLE   Pneumonia    Presence of permanent cardiac pacemaker    Wears glasses     Tobacco History: Social History   Tobacco Use  Smoking Status Former   Packs/day: 2.00   Years: 15.00   Additional pack years: 0.00   Total  pack years: 30.00   Types: Cigarettes   Start date: 05/05/1958   Quit date: 05/05/1973   Years since quitting: 49.5  Smokeless Tobacco Never   Counseling given: Not Answered   Outpatient Medications Prior to Visit  Medication Sig Dispense Refill   albuterol (VENTOLIN HFA) 108 (90 Base) MCG/ACT inhaler Inhale 2 puffs into the lungs every 6 (six) hours as needed for wheezing or shortness of breath.     aspirin 81 MG tablet Take 81 mg by mouth daily.     atorvastatin (LIPITOR) 80 MG tablet TAKE 1 TABLET BY MOUTH ONCE  DAILY 90 tablet 2   CALCIUM-MAGNESIUM-ZINC PO Take 1 tablet by mouth daily.     cyclobenzaprine (FLEXERIL) 10 MG tablet Take 1 tablet (10 mg total) by mouth 2 (two) times daily as needed for muscle spasms. 20 tablet 0   diltiazem (CARDIZEM SR) 120 MG 12 hr capsule Take 1 capsule (120 mg total) by mouth daily. (Patient taking differently: Take 120 mg by mouth every evening.) 90 capsule 3   docusate sodium ([name]) 100 MG capsule Take 100 mg by mouth daily.     empagliflozin (JARDIANCE) 10 MG TABS tablet Take 1 tablet (10 mg total) by mouth daily before breakfast. 90 tablet 3   ezetimibe (ZETIA) 10 MG tablet Take 1 tablet by mouth once daily (Patient taking differently: Take 10 mg by mouth every evening.) 90 tablet 3   famotidine (PEPCID) 20 MG tablet TAKE ONE TABLET BY MOUTH ONCE DAILY AT BEDTIME (Patient taking differently: Take 20 mg by mouth at bedtime.) 30 tablet 0   furosemide (LASIX) 40 MG tablet Take as needed if there is a weight gain of more than 2 pounds over 2 to 3 days. 30 tablet    gabapentin (NEURONTIN) 300 MG capsule Take 300 mg by mouth 2 (two) times daily.     glimepiride (AMARYL) 2 MG tablet Take 2 mg by mouth daily with breakfast.     Guaifenesin (MUCINEX MAXIMUM STRENGTH) 1200 MG TB12 Take 1,200 mg by mouth 2 (two) times daily.     ipratropium-albuterol (DUONEB) 0.5-2.5 (3) MG/3ML SOLN Inhale 3 mLs into the lungs every 6 (six) hours as needed (wheezing or shortness  of breath). 360 mL 12   isosorbide mononitrate (IMDUR) 60 MG 24 hr tablet Take 1 tablet (60 mg total) by mouth daily. 90 tablet 3   levocetirizine (XYZAL) 5 MG tablet Take 5 mg at bedtime by mouth.      metoprolol tartrate (LOPRESSOR) 50 MG tablet Take 1 tablet (50 mg total) by mouth 2 (two) times daily. 180 tablet 3   montelukast (SINGULAIR) 10 MG tablet Take 10 mg by mouth daily.     Multiple Vitamin (MULTIVITAMIN WITH MINERALS) TABS tablet Take 1 tablet by mouth every other day.     nitroGLYCERIN (NITROSTAT) 0.4 MG SL tablet DISSOLVE ONE TABLET UNDER THE TONGUE EVERY 5 MINUTES AS NEEDED FOR CHEST PAIN.  DO NOT EXCEED A TOTAL OF 3 DOSES IN 15 MINUTES (Patient taking differently: Place 0.4 mg under the tongue every 5 (five) minutes x 3 doses as needed for chest pain.) 25 tablet 5   olmesartan (BENICAR) 20 MG tablet Take  10 mg by mouth daily.     pantoprazole (PROTONIX) 40 MG tablet TAKE ONE TABLET BY MOUTH ONCE DAILY TAKE  30-60  MINUTES  BEFORE  FIRST  MEAL  OF  THE  DAY (Patient taking differently: Take 40 mg by mouth daily before breakfast.) 30 tablet 0   predniSONE (DELTASONE) 10 MG tablet Take 1 tablet (10 mg total) by mouth daily with breakfast. Resume only after you have completed the following dose: 30 mg once daily for 7 days followed by 20 mg once daily for 7 days. (Patient taking differently: Take 10 mg by mouth daily with breakfast. Resume only after you have completed the following dose: 30 mg once daily for 7 days followed by 20 mg once daily for 7 days. Started 30 mg over the weekend.) 20 tablet 0   Respiratory Therapy Supplies (FLUTTER) DEVI Blow through 4 times per set, 3 sets per day when needed to clear lungs 1 each 0   sodium chloride 1 g tablet Take 1 tablet (1 g total) by mouth 2 (two) times daily with a meal. 60 tablet 0   TRELEGY ELLIPTA 100-62.5-25 MCG/ACT AEPB USE 1 INHALATION BY MOUTH DAILY (Patient taking differently: Inhale 1 puff into the lungs daily.) 180 each 3    predniSONE (DELTASONE) 10 MG tablet Take 3 tablets once daily for 7 days followed by 2 tablets once daily for 7 days and then resume 1 tablet once daily as before. (Patient not taking: Reported on 11/04/2022) 60 tablet 0   Facility-Administered Medications Prior to Visit  Medication Dose Route Frequency Provider Last Rate Last Admin   albuterol (PROVENTIL) (2.5 MG/3ML) 0.083% nebulizer solution 2.5 mg  2.5 mg Nebulization Once Lincoln Ginley S, NP         Review of Systems:   Constitutional:   No  weight loss, night sweats,  Fevers, chills, + fatigue, or  lassitude.  HEENT:   No headaches,  Difficulty swallowing,  Tooth/dental problems, or  Sore throat,                No sneezing, itching, ear ache, nasal congestion, post nasal drip,   CV:  No chest pain,  Orthopnea, PND, swelling in lower extremities, anasarca, dizziness, palpitations, syncope.   GI  No heartburn, indigestion, abdominal pain, nausea, vomiting, diarrhea, change in bowel habits, loss of appetite, bloody stools.   Resp: No chest wall deformity  Skin: no rash or lesions.  GU: no dysuria, change in color of urine, no urgency or frequency.  No flank pain, no hematuria   MS:  No joint pain or swelling.  No decreased range of motion.  No back pain.    Physical Exam  BP (!) 100/56 (BP Location: Right Arm, Patient Position: Sitting, Cuff Size: Normal)   Pulse 74   Temp 98.8 F (37.1 C) (Oral)   Ht 6' (1.829 m)   Wt 222 lb (100.7 kg)   SpO2 90% Comment: up to 93% with rest  BMI 30.11 kg/m   GEN: A/Ox3; pleasant , NAD, chronically ill-appearing, in wheelchair    HEENT:  [place]/AT,  EACs-clear, TMs-wnl, NOSE-clear, THROAT-clear, no lesions, no postnasal drip or exudate noted.   NECK:  Supple w/ fair ROM; no JVD; normal carotid impulses w/o bruits; no thyromegaly or nodules palpated; no lymphadenopathy.    RESP rhonchi bilaterally . no accessory muscle use, no dullness to percussion  CARD:  RRR, no m/r/g, tr acute COPD  peripheral edema, pulses intact, no cyanosis or clubbing.  GI:   Soft & nt; nml bowel sounds; no organomegaly or masses detected.   Musco: Warm bil, no deformities or joint swelling noted.   Neuro: alert, no focal deficits noted.    Skin: Warm, no lesions or rashes    Lab Results:  CBC    Component Value Date/Time   WBC 8.1 11/04/2022 1037   RBC 4.22 11/04/2022 1037   HGB 13.1 11/04/2022 1037   HCT 39.5 11/04/2022 1037   PLT 168.0 11/04/2022 1037   MCV 93.7 11/04/2022 1037   MCH 31.9 10/10/2022 0753   MCHC 33.1 11/04/2022 1037   RDW 14.5 11/04/2022 1037   LYMPHSABS 1.3 11/04/2022 1037   MONOABS 1.3 (H) 11/04/2022 1037   EOSABS 0.3 11/04/2022 1037   BASOSABS 0.1 11/04/2022 1037    BMET    Component Value Date/Time   NA 136 11/04/2022 1037   NA 142 04/23/2022 0920   K 3.8 11/04/2022 1037   CL 106 11/04/2022 1037   CO2 23 11/04/2022 1037   GLUCOSE 89 11/04/2022 1037   BUN 18 11/04/2022 1037   BUN 12 04/23/2022 0920   CREATININE 1.15 11/04/2022 1037   CREATININE 1.23 (H) 06/07/2015 1329   CALCIUM 8.4 11/04/2022 1037   GFRNONAA >60 10/10/2022 0753   GFRAA 70 06/29/2020 1047    BNP    Component Value Date/Time   BNP 50.1 09/29/2022 1025    ProBNP    Component Value Date/Time   PROBNP 256.0 (H) 11/04/2022 1037    Imaging: DG Chest 2 View  Result Date: 11/04/2022 CLINICAL DATA:  COPD flare. EXAM: CHEST - 2 VIEW COMPARISON:  Chest x-ray June 5, 24. FINDINGS: Mild streaky opacities in the lungs bilaterally could represent atelectasis, aspiration or early pneumonia. No visible pleural effusions or pneumothorax. Cardiomediastinal silhouette is within normal limits. Left subclavian approach cardiac rhythm and stay vice. IMPRESSION: Mild streaky opacities in the lungs bilaterally could represent atelectasis, aspiration or early pneumonia. No confluent consolidation. Electronically Signed   By: Feliberto Harts M.D.   On: 11/04/2022 11:22   NM PET CT CARDIAC  PERFUSION MULTI W/ABSOLUTE BLOODFLOW  Result Date: 10/28/2022   LV perfusion is normal. There is no evidence of ischemia. There is no evidence of infarction.   Rest left ventricular function is normal. Rest EF: 52 %. Stress left ventricular function is normal. Stress EF: 53 %. End diastolic cavity size is normal. End systolic cavity size is normal.   Myocardial blood flow was computed to be 0.62ml/g/min at rest and 2.49ml/g/min at stress. Global myocardial blood flow reserve was 2.70 and was normal.   Coronary calcium was present on the attenuation correction CT images. Mild coronary calcifications were present. Coronary calcifications were present in the left anterior descending artery and left circumflex artery distribution(s).   The study is normal. The study is low risk.   Pacing wires in RA/RV CLINICAL DATA:  This over-read does not include interpretation of cardiac or coronary anatomy or pathology. The cardiac PET-CT interpretation by the cardiologist is attached. COMPARISON:  None Available. FINDINGS: No suspicious nodules, masses, or infiltrates are identified in the visualized portion of the lungs. No pleural fluid seen. The visualized portions of the mediastinum and chest wall are unremarkable. IMPRESSION: No significant non-cardiac abnormality identified. Electronically Signed   By: Danae Orleans M.D.   On: 10/28/2022 09:48  CT HEAD WO CONTRAST ( )  Result Date: 10/09/2022 CLINICAL DATA:  Mental status change, unknown cause. EXAM: CT HEAD WITHOUT CONTRAST TECHNIQUE: Contiguous axial  images were obtained from the base of the skull through the vertex without intravenous contrast. RADIATION DOSE REDUCTION: This exam was performed according to the departmental dose-optimization program which includes automated exposure control, adjustment of the mA and/or kV according to patient size and/or use of iterative reconstruction technique. COMPARISON:  Head CT 10/08/2022. FINDINGS: Brain: No acute intracranial  hemorrhage. Stable mild chronic small vessel disease. Gray-white differentiation is otherwise preserved. No hydrocephalus or extra-axial collection. No mass effect or midline shift. Vascular: No hyperdense vessel or unexpected calcification. Skull: No calvarial fracture or suspicious bone lesion. Skull base is unremarkable. Sinuses/Orbits: Mild paranasal sinus disease. Orbits are unremarkable. Mastoids are well aerated. Other: None. IMPRESSION: 1. No acute intracranial abnormality. 2. Stable mild chronic small vessel disease. Electronically Signed   By: Orvan Falconer M.D.   On: 10/09/2022 13:23   CT HEAD WO CONTRAST  Result Date: 10/08/2022 CLINICAL DATA:  Altered mental status EXAM: CT HEAD WITHOUT CONTRAST TECHNIQUE: Contiguous axial images were obtained from the base of the skull through the vertex without intravenous contrast. RADIATION DOSE REDUCTION: This exam was performed according to the departmental dose-optimization program which includes automated exposure control, adjustment of the mA and/or kV according to patient size and/or use of iterative reconstruction technique. COMPARISON:  CT head 09/29/2022. FINDINGS: Brain: There is no evidence of acute intracranial hemorrhage, extra-axial fluid collection, or acute infarct. Parenchymal volume is normal. The ventricles are normal in size. Gray-white differentiation is preserved. Patchy hypodensity in the supratentorial white matter is nonspecific but likely reflects sequela of chronic small-vessel ischemic change. The pituitary and suprasellar region are normal. There is no mass lesion. There is no mass effect or midline shift. Vascular: No hyperdense vessel or unexpected calcification. Skull: Normal. Negative for fracture or focal lesion. Sinuses/Orbits: There is mild mucosal thickening in the paranasal sinuses. Bilateral lens implants are in place. The globes and orbits are otherwise unremarkable. Other: None. IMPRESSION: Stable noncontrast head CT  with no acute intracranial pathology. Electronically Signed   By: Lesia Hausen M.D.   On: 10/08/2022 12:47   DG Chest 2 View  Result Date: 10/08/2022 CLINICAL DATA:  Cough and shortness of breath EXAM: CHEST - 2 VIEW COMPARISON:  09/29/2022 FINDINGS: Left upper chest pacemaker battery pack with leads along the right side of the heart. Stable cardiopericardial silhouette. Tortuous aorta. No pneumothorax, effusion or edema. Minimal linear opacity left lung base likely scar or atelectasis. Air-fluid level along the stomach beneath the left hemidiaphragm. Films are under penetrated IMPRESSION: Under penetrated radiographs. Left upper chest pacemaker. Minimal opacity left lung base likely scar or atelectasis Electronically Signed   By: Karen Kays M.D.   On: 10/08/2022 11:11         Latest Ref Rng & Units 10/26/2017   12:15 PM 08/10/2015    8:35 AM  PFT Results  FVC-Pre L 3.70  3.53   FVC-Predicted Pre % 78  73   FVC-Post L 3.89  3.58   FVC-Predicted Post % 82  74   Pre FEV1/FVC % % 82  84   Post FEV1/FCV % % 83  84   FEV1-Pre L 3.03  2.98   FEV1-Predicted Pre % 88  85   FEV1-Post L 3.21  2.99   DLCO uncorrected ml/min/mmHg 21.37  20.62   DLCO UNC% % 59  57   DLCO corrected ml/min/mmHg  23.93   DLCO COR %Predicted %  67   DLVA Predicted % 77  99   TLC L 6.57  5.61   TLC % Predicted % 87  74   RV % Predicted % 94  84     No results found for: "NITRICOXIDE"      Assessment & Plan:   COPD with acute exacerbation (HCC) Acute COPD exacerbation-patient is high risk for decompensation.  He is very weak.  Recent hospitalization.  He lives alone.  Recommended hospitalization for further evaluation and treatment .  Long discussion with patient and daughter.  They decline at this time despite discussion of potential complications of worsening COPD and acute on chronic respiratory failure Patient's symptom burden is disproportionate to level on COPD with previous PFTs being normal.  Will repeat  PFTs when able. Check lab work including D-dimer and BNP.  If D-dimer is elevated will need a CT chest angio to rule out VTE. For now treat with empiric antibiotics and steroids.-  Will treat with a prolonged steroid taper and close follow-up. Advised patient and daughter that if patient's condition is not improving or worsens he will need to go to the emergency room for evaluation of hospital admission  Plan  Patient Instructions  Doxycycline 100mg  Twice daily  for 1 week, wear sunscreen. Avoid extreme heat and sun exposure .  Prednisone taper over next week.  Continue on Trelegy 1 puff daily, rinse after use.  Mucinex Twice daily  As needed  cough /congestion  Delsym 2 tsp Twice daily  for cough As needed   Tessalon Three times a day  for cough As needed   Continue on BIPAP At bedtime , wear all night long for at least 6hrs or more and can use during daytime as needed and with naps.  Flutter valve Twice daily   Albuterol inhaler or Duoneb As needed   Activity as tolerated.  Continue on Singulair daily  Chest xray and labs today .  Use caution with sedating medications  Follow up with Dr. Maple Hudson or Sohil Timko NP in 1 week and As needed   Please contact office for sooner follow up if symptoms do not improve or worsen or seek emergency care      Physical deconditioning Generalized weakness on return visit consider home PT.  AMS (altered mental status) Recent hospitalization for altered mental status.  Encouraged to use caution with sedating medications.  Would avoid pain medications if possible  Hyponatremia Recent hospitalization for symptomatic hyponatremia.  Check be met today.   I spent  54  minutes dedicated to the care of this patient on the date of this encounter to include pre-visit review of records, face-to-face time with the patient discussing conditions above, post visit ordering of testing, clinical documentation with the electronic health record, making appropriate  referrals as documented, and communicating necessary findings to members of the patients care team.   Rubye Oaks, NP 11/04/2022

## 2022-11-04 NOTE — Telephone Encounter (Signed)
Spoke with Tresa Endo at the front desk while waiting on a patient in the lab.  Advised that the meds were sent to [facility] pharmacy as verified during visit.  Family member verified they received the medications.  Nothing further needed.

## 2022-11-04 NOTE — Assessment & Plan Note (Signed)
Generalized weakness on return visit consider home PT.

## 2022-11-04 NOTE — Assessment & Plan Note (Signed)
Recent hospitalization for symptomatic hyponatremia.  Check be met today.

## 2022-11-05 ENCOUNTER — Ambulatory Visit (HOSPITAL_BASED_OUTPATIENT_CLINIC_OR_DEPARTMENT_OTHER)
Admission: RE | Admit: 2022-11-05 | Discharge: 2022-11-05 | Disposition: A | Discharge status: Home / Self Care | Payer: 59 | Source: Ambulatory Visit | Attending: Adult Health | Admitting: Adult Health

## 2022-11-05 DIAGNOSIS — R918 Other nonspecific abnormal finding of lung field: Secondary | ICD-10-CM | POA: Diagnosis not present

## 2022-11-05 DIAGNOSIS — R0600 Dyspnea, unspecified: Secondary | ICD-10-CM | POA: Diagnosis not present

## 2022-11-05 DIAGNOSIS — N281 Cyst of kidney, acquired: Secondary | ICD-10-CM | POA: Diagnosis not present

## 2022-11-05 DIAGNOSIS — K8020 Calculus of gallbladder without cholecystitis without obstruction: Secondary | ICD-10-CM | POA: Diagnosis not present

## 2022-11-05 DIAGNOSIS — R7989 Other specified abnormal findings of blood chemistry: Secondary | ICD-10-CM | POA: Insufficient documentation

## 2022-11-05 DIAGNOSIS — I2510 Atherosclerotic heart disease of native coronary artery without angina pectoris: Secondary | ICD-10-CM | POA: Diagnosis not present

## 2022-11-05 MED ORDER — IOHEXOL 350 MG/ML SOLN
100.0000 mL | Freq: Once | INTRAVENOUS | Status: AC | PRN
  Administered 2022-11-05: 75 mL via INTRAVENOUS

## 2022-11-12 NOTE — Progress Notes (Signed)
HPI  M former smoker with COPD, chronic cough, OSA complicated by Cardiac dysrhythmia/ pacemaker, HBP  Office Spirometry 08/11/14-moderate obstruction/restriction-FVC 2.95/62%, FEV1 2.37/65%, ratio 0.79, FEF 25-75% 2.43/77% PFT 10/26/2017-normal flows without response to bronchodilator, moderate diffusion defect. Walk Test O2 Qualifying-04/26/2018-qualified for portable oxygen-during his second lap heart rate reached 112 and saturation fell to 86% on room air Labs 04/26/2018- IgE 464, EOS wnl HST 05/17/18>> Severe obstructive sleep apnea AHI:33 CPAP to BIPAP titration 02/18/2019-  BIPAP 10/6, if not tolerated try CPAP 7  Cardiac PET 6/25, WNL low risk -----------------------------------------------------------------------------------------    10/02/22- 77 year old male former smoker followed for COPD, restrictive lung disease, cough, OSA, complicated by CAD, CHF, cardiac arrhythmia/Pacemaker/ ICD, HBP, DM  BIPAP 10/6/ [employer] 10 S machine -Trelegy 100, Proair hfa, prednisone 10 mg QOD, Singulair, neb Duoneb, Download- compliance 63%, AHI 5.8/ hr Body weight today 227 lbs LOV 4/16 Parrett, NP- acute exacerb COPD. Walk test > 90% on room air. >> Pred taper, doxy, Flutter valve, Zyrtec at hs. Pending Cardiac PET in June.                  Here with granddaughter. -----Pt is doing okay He resolved breathing flareup from April and feels he is back to baseline now.  Uses Trelegy routinely, prednisone every other day and ProAir occasionally.  Asks refill nebulizer solution. Download reviewed.  He continues BiPAP.  We discussed compliance goals.  He does feel he is better off with BiPAP than without. He is wearing a cervical hard collar after degenerative disc disease with nerve impingement, hoping to avoid surgery. CXR 08/19/22- IMPRESSION: Band-like opacity at the left lung base, favor atelectasis.  11/13/22- 77 year old male former smoker followed for COPD, restrictive lung  disease, cough, OSA, Tracheomalacia, complicated by CAD, CHF, cardiac arrhythmia/Pacemaker/ ICD, HBP, DM  BIPAP 10/6/ [employer]     [employer] 10 S machine -Trelegy 100, Proair hfa, prednisone 10 mg QD, Singulair, neb Duoneb, Download- compliance 87%, AHI 2.5/ hr Body weight today 215 lbs LOV Parrett, NP 11/04/22-acute COPD exacerbation.> doxycycline, pred taper, Flutter valve. Note recent hosp for altered mentation, hyponatremia, dehydration.  -----Cpap f/u, no concerns  Here with granddaughter.  He says his breathing is nearly back to baseline, finishing doxycycline after treatment here.  Some residual cough but nonproductive now. Download reviewed.  He is comfortable with his BiPAP and sleep is okay. Cardiac PET 6/25, WNL low risk Labs 11/04/22- Na 133, D-dimer 1.15 H, BNP 256 H to continue diuretic. CTaPE 11/05/22- IMPRESSION: 1. No evidence of pulmonary embolism. 2. Small consolidative opacity in the lingula not present on the recent cardiac CT from 10/28/2022 may reflect atelectasis or infection. 3. Mosaic attenuation in the lungs consistent with air trapping/small airway disease. 4. Inward bowing of the posterior tracheal wall suggestive of dynamic tracheal collapse. Mild bronchial wall thickening may reflect bronchitis. 5. Prominent mediastinal and hilar lymph nodes are nonspecific but similar to the cardiac CT from 2020. 6. Cholelithiasis without evidence of acute cholecystitis.    ROS-see HPI   + = positive Constitutional:    weight loss, night sweats, fevers, chills, fatigue, lassitude. HEENT:    headaches, difficulty swallowing, tooth/dental problems, sore throat,       sneezing, itching, ear ache, +nasal congestion, +post nasal drip, snoring CV:    +chest pain, orthopnea, PND, swelling in lower extremities, anasarca, dizziness, palpitations Resp:   +shortness of breath with exertion or at rest.                +  productive cough,   + non-productive cough, coughing up of  blood.              change in color of mucus.  +wheezing.   Skin:    rash or lesions. GI:  No-   heartburn, indigestion, abdominal pain, nausea, vomiting, GU:  MS:   joint pain, stiffness,  Neuro-     nothing unusual Psych:  change in mood or affect.  depression or anxiety.   memory loss.  OBJ- Physical Exam General- Alert, Oriented, Affect-appropriate, Distress- none acute,  Skin- rash-none, lesions- none, excoriation- none Lymphadenopathy- none Head- atraumatic            Eyes- Gross vision intact, PERRLA, conjunctivae and secretions clear            Ears- Hearing, canals-normal            Nose- Clear, no-Septal dev, mucus, polyps, erosion, perforation             Throat- Mallampati II , mucosa clear , drainage-none, tonsils- atrophic, edentulous+, not hoarse Neck-  Chest - symmetrical excursion , unlabored           Heart/CV- RRR , no murmur , no gallop  , no rub, nl s1 s2                           - JVD- none , edema- none, stasis changes- none, varices- none           Lung- +coarse, cough+slight, dullness-none, rub- none,            Chest wall- +L pacemaker Abd-  Br/ Gen/ Rectal- Not done, not indicated Extrem- cyanosis- none, clubbing, none, atrophy- none, strength- nl, +cane Neuro- grossly intact to observation

## 2022-11-13 ENCOUNTER — Ambulatory Visit (INDEPENDENT_AMBULATORY_CARE_PROVIDER_SITE_OTHER): Payer: 59 | Admitting: Internal Medicine

## 2022-11-13 ENCOUNTER — Encounter: Payer: Self-pay | Admitting: Internal Medicine

## 2022-11-13 VITALS — BP 116/74 | HR 92 | Ht 72.0 in | Wt 215.6 lb

## 2022-11-13 DIAGNOSIS — Z950 Presence of cardiac pacemaker: Secondary | ICD-10-CM | POA: Diagnosis not present

## 2022-11-13 DIAGNOSIS — G4733 Obstructive sleep apnea (adult) (pediatric): Secondary | ICD-10-CM | POA: Diagnosis not present

## 2022-11-13 DIAGNOSIS — J4489 Other specified chronic obstructive pulmonary disease: Secondary | ICD-10-CM

## 2022-11-13 NOTE — Patient Instructions (Signed)
I'm glad you are feeling better. Finish the prednisone and continue with your regular medicines.  Please call if we can help

## 2022-11-14 ENCOUNTER — Encounter: Payer: Self-pay | Admitting: Internal Medicine

## 2022-11-14 NOTE — Assessment & Plan Note (Signed)
He continues to follow closely with cardiology

## 2022-11-14 NOTE — Assessment & Plan Note (Signed)
Resolving acute exacerbation.  Note CT scan suggested air trapping/small airways disease and noted inward bowing of the posterior tracheal wall suggestive of dynamic tracheal collapse as well as bronchitis.  Stable chronic lymph nodes.  Some density in the lingula may be residual from the recent exacerbation. Plan-Finish the last of the prednisone and continue routine meds.

## 2022-11-14 NOTE — Assessment & Plan Note (Signed)
Benefits from BIPAP with continued good compliance and control Continue BiPAP 10/6

## 2022-11-20 ENCOUNTER — Inpatient Hospital Stay: Payer: 59 | Admitting: Internal Medicine

## 2022-12-01 DIAGNOSIS — I495 Sick sinus syndrome: Secondary | ICD-10-CM | POA: Insufficient documentation

## 2022-12-01 DIAGNOSIS — I472 Ventricular tachycardia, unspecified: Secondary | ICD-10-CM | POA: Insufficient documentation

## 2022-12-03 ENCOUNTER — Other Ambulatory Visit: Payer: Self-pay | Admitting: Internal Medicine

## 2022-12-04 NOTE — Progress Notes (Unsigned)
Patient Care Team: Noberto Retort, MD as PCP - General (Family Medicine) Duke Salvia, MD as PCP - Electrophysiology (Cardiology) Christell Constant, MD as PCP - Cardiology (Cardiology)   HPI:  [name] [name] is a 77 y.o. male Seen in followup for pacemaker implanted Medtronic  for chronotropic incompetence and sinus node dysfunction 2007  by Dr. Erie Noe.   gen replacement 2/17   Now with PVCs, freq LBBB sup axis, with normal TW anteriiorly  The patient denies chest pain, nocturnal dyspnea, orthopnea.  Infrequent peripheral edema.  There have been no palpitations, lightheadedness or syncope.  Complains of dyspnea on exertion which is variable.  Known COPD.[name]   DATE TEST EF   9/14 MYOVIEW    % No ischemia  4/16 Echo  55-60 %   10/21 CTA  CaScore 117 No obstruc disease  1/22 Echo  60-65% Ao Asc 43 mm  6/24 PET  52% No ischemia   . Date Cr K Na Hgb  8/23 1.37 3.4  14.7   7/24 1.15 3.8 136<<124 13.1      Past Medical History:  Diagnosis Date   Arthritis    Bradycardia    pacemaker - Medtronic Adapta #[id number] - December 22, 2005   Carpal tunnel syndrome on both sides    Carpal tunnel syndrome, bilateral    Chronic back pain    Chronic kidney disease    COPD (chronic obstructive pulmonary disease) (HCC)    Depression    Diabetes mellitus    type II controled with diet   GERD (gastroesophageal reflux disease)    Headache(784.0)    Hyperlipidemia    Hypertension    Hypertensive retinopathy    Insomnia    Neuromuscular disorder (HCC)    peripheral neuropathy BLE   Pneumonia    Presence of permanent cardiac pacemaker    Wears glasses     Past Surgical History:  Procedure Laterality Date   BACK SURGERY     CARPAL TUNNEL RELEASE Right 03/24/2017   Procedure: Right CARPAL TUNNEL RELEASE;  Surgeon: Maeola Harman, MD;  Location: [facility] OR;  Service: Neurosurgery;  Laterality: Right;  Right CARPAL TUNNEL RELEASE   CATARACT EXTRACTION     EP IMPLANTABLE DEVICE N/A  06/27/2015   Procedure:  PPM Generator Changeout;  Surgeon: Duke Salvia, MD;  Location: [facility] INVASIVE CV LAB;  Service: Cardiovascular;  Laterality: N/A;   EYE SURGERY     HEMORRHOID SURGERY     INSERT / REPLACE / REMOVE PACEMAKER     MULTIPLE TOOTH EXTRACTIONS     PACEMAKER INSERTION  ?2007   TOTAL HIP ARTHROPLASTY Right 01/25/2013   Procedure: TOTAL HIP ARTHROPLASTY ANTERIOR APPROACH;  Surgeon: Velna Ochs, MD;  Location: [facility] OR;  Service: Orthopedics;  Laterality: Right;   ULNAR NERVE TRANSPOSITION Right 03/24/2017   Procedure: Right Ulnar nerve release;  Surgeon: Maeola Harman, MD;  Location: [facility] OR;  Service: Neurosurgery;  Laterality: Right;  Right Ulnar nerve release    Current Outpatient Medications  Medication Sig Dispense Refill   albuterol (VENTOLIN HFA) 108 (90 Base) MCG/ACT inhaler Inhale 2 puffs into the lungs every 6 (six) hours as needed for wheezing or shortness of breath.     aspirin 81 MG tablet Take 81 mg by mouth daily.     atorvastatin (LIPITOR) 80 MG tablet TAKE 1 TABLET BY MOUTH ONCE  DAILY 90 tablet 2   benzonatate (TESSALON) 200 MG capsule Take 1 capsule (200 mg  total) by mouth 3 (three) times daily as needed for cough. 45 capsule 3   CALCIUM-MAGNESIUM-ZINC PO Take 1 tablet by mouth daily.     cyclobenzaprine (FLEXERIL) 10 MG tablet Take 1 tablet (10 mg total) by mouth 2 (two) times daily as needed for muscle spasms. 20 tablet 0   diltiazem (CARDIZEM SR) 120 MG 12 hr capsule Take 1 capsule (120 mg total) by mouth daily. (Patient taking differently: Take 120 mg by mouth every evening.) 90 capsule 3   docusate sodium (COLACE) 100 MG capsule Take 100 mg by mouth daily.     doxycycline (VIBRA-TABS) 100 MG tablet Take 1 tablet (100 mg total) by mouth 2 (two) times daily. 14 tablet 0   empagliflozin (JARDIANCE) 10 MG TABS tablet Take 1 tablet (10 mg total) by mouth daily before breakfast. 90 tablet 3   ezetimibe (ZETIA) 10 MG tablet Take 1 tablet by mouth once daily  (Patient taking differently: Take 10 mg by mouth every evening.) 90 tablet 3   famotidine (PEPCID) 20 MG tablet TAKE ONE TABLET BY MOUTH ONCE DAILY AT BEDTIME (Patient taking differently: Take 20 mg by mouth at bedtime.) 30 tablet 0   furosemide (LASIX) 40 MG tablet Take as needed if there is a weight gain of more than 2 pounds over 2 to 3 days. 30 tablet    gabapentin (NEURONTIN) 300 MG capsule Take 300 mg by mouth 2 (two) times daily.     glimepiride (AMARYL) 2 MG tablet Take 2 mg by mouth daily with breakfast.     Guaifenesin (MUCINEX MAXIMUM STRENGTH) 1200 MG TB12 Take 1,200 mg by mouth 2 (two) times daily.     ipratropium-albuterol (DUONEB) 0.5-2.5 (3) MG/3ML SOLN Inhale 3 mLs into the lungs every 6 (six) hours as needed (wheezing or shortness of breath). 360 mL 12   isosorbide mononitrate (IMDUR) 60 MG 24 hr tablet Take 1 tablet (60 mg total) by mouth daily. 90 tablet 3   levocetirizine (XYZAL) 5 MG tablet Take 5 mg at bedtime by mouth.      metoprolol tartrate (LOPRESSOR) 50 MG tablet Take 1 tablet (50 mg total) by mouth 2 (two) times daily. 180 tablet 3   montelukast (SINGULAIR) 10 MG tablet Take 10 mg by mouth daily.     Multiple Vitamin (MULTIVITAMIN WITH MINERALS) TABS tablet Take 1 tablet by mouth every other day.     nitroGLYCERIN (NITROSTAT) 0.4 MG SL tablet DISSOLVE ONE TABLET UNDER THE TONGUE EVERY 5 MINUTES AS NEEDED FOR CHEST PAIN.  DO NOT EXCEED A TOTAL OF 3 DOSES IN 15 MINUTES (Patient taking differently: Place 0.4 mg under the tongue every 5 (five) minutes x 3 doses as needed for chest pain.) 25 tablet 5   olmesartan (BENICAR) 20 MG tablet Take 10 mg by mouth daily.     pantoprazole (PROTONIX) 40 MG tablet TAKE ONE TABLET BY MOUTH ONCE DAILY TAKE  30-60  MINUTES  BEFORE  FIRST  MEAL  OF  THE  DAY (Patient taking differently: Take 40 mg by mouth daily before breakfast.) 30 tablet 0   predniSONE (DELTASONE) 10 MG tablet Take 1 tablet (10 mg total) by mouth daily with breakfast.  Resume only after you have completed the following dose: 30 mg once daily for 7 days followed by 20 mg once daily for 7 days. (Patient taking differently: Take 10 mg by mouth daily with breakfast. Resume only after you have completed the following dose: 30 mg once daily for 7 days followed  by 20 mg once daily for 7 days. Started 30 mg over the weekend.) 20 tablet 0   predniSONE (DELTASONE) 10 MG tablet 4 tabs for 3 days, then 3 tabs for 3 days, 2 tabs for 3 days, then 1 tab daily 30 tablet 0   Respiratory Therapy Supplies (FLUTTER) DEVI Blow through 4 times per set, 3 sets per day when needed to clear lungs 1 each 0   sodium chloride 1 g tablet Take 1 tablet (1 g total) by mouth 2 (two) times daily with a meal. 60 tablet 0   TRELEGY ELLIPTA 100-62.5-25 MCG/ACT AEPB USE 1 INHALATION BY MOUTH DAILY (Patient taking differently: Inhale 1 puff into the lungs daily.) 180 each 3   Current Facility-Administered Medications  Medication Dose Route Frequency Provider Last Rate Last Admin   albuterol (PROVENTIL) (2.5 MG/3ML) 0.083% nebulizer solution 2.5 mg  2.5 mg Nebulization Once Parrett, Tammy S, NP        Allergies  Allergen Reactions   Penicillins Anaphylaxis   Codeine Rash    Review of Systems negative except from HPI and PMH  Physical Exam BP 108/62   Pulse 84   Ht 6' (1.829 m)   Wt 218 lb 9.6 oz (99.2 kg)   SpO2 97%   BMI 29.65 kg/m  Well developed and well nourished in no acute distress HENT normal Neck supple with JVP-flat Decreased breath sounds with scattered crackles Device pocket well healed; without hematoma or erythema.  There is no tethering  Regular rate and rhythm, no  gallop No  murmur Abd-soft with active BS No Clubbing cyanosis tr edema Skin-warm and dry A & Oriented  Grossly normal sensory and motor function  ECG A pacing 84 28/10/35 PVC freq LBBB superirr axis  Device function is normal. Programming changes none See Paceart for details    Assessment and   Plan  Sinus node dysfunction   Pacemaker Medtronic   PVCs-frequent  PACs frequent  VT sustained   Hypertension   Atrial tachycardia  HFpEF-  COPD  Change in Hgb 14.7>>13.1    Device function is normal with reasonable heart rate excursion  PVCs are frequent these continues to have sustained ventricular tachycardia.  In the setting of a normal heart, we will continue his beta-blocker; they are averse to the use of amiodarone.  Flecainide was not particularly effective.  Could consider mexiletine or dronaderone; however, in the absence of symptoms with a relatively normal heart we will hold observation for now  Frequent PACs also, we will continue the metoprolol and diltiazem  Blood pressure is well-controlled on current medications.  Continue the above medications in addition to the Benicar.  With his HFpEF, we will continue his furosemide at 40 and his SGLT2  Blood count was noted to decrease by about 10% plus.  Will recheck hemoglobin today.

## 2022-12-05 ENCOUNTER — Ambulatory Visit: Payer: 59 | Attending: Internal Medicine | Admitting: Internal Medicine

## 2022-12-05 ENCOUNTER — Encounter: Payer: Self-pay | Admitting: Internal Medicine

## 2022-12-05 VITALS — BP 108/62 | HR 84 | Ht 72.0 in | Wt 218.6 lb

## 2022-12-05 DIAGNOSIS — I472 Ventricular tachycardia, unspecified: Secondary | ICD-10-CM

## 2022-12-05 DIAGNOSIS — I4719 Other supraventricular tachycardia: Secondary | ICD-10-CM | POA: Diagnosis not present

## 2022-12-05 DIAGNOSIS — I495 Sick sinus syndrome: Secondary | ICD-10-CM

## 2022-12-05 DIAGNOSIS — I493 Ventricular premature depolarization: Secondary | ICD-10-CM

## 2022-12-05 DIAGNOSIS — Z950 Presence of cardiac pacemaker: Secondary | ICD-10-CM | POA: Diagnosis not present

## 2022-12-05 DIAGNOSIS — Z79899 Other long term (current) drug therapy: Secondary | ICD-10-CM | POA: Diagnosis not present

## 2022-12-05 NOTE — Patient Instructions (Signed)
Medication Instructions:  Your physician recommends that you continue on your current medications as directed. Please refer to the Current Medication list given to you today.  *If you need a refill on your cardiac medications before your next appointment, please call your pharmacy*   Lab Work: CBC today If you have labs (blood work) drawn today and your tests are completely normal, you will receive your results only by: MyChart Message (if you have MyChart) OR A paper copy in the mail If you have any lab test that is abnormal or we need to change your treatment, we will call you to review the results.   Testing/Procedures: Your physician has requested that you have an echocardiogram. Echocardiography is a painless test that uses sound waves to create images of your heart. It provides your doctor with information about the size and shape of your heart and how well your heart's chambers and valves are working. This procedure takes approximately one hour. There are no restrictions for this procedure. Please do NOT wear cologne, perfume, aftershave, or lotions (deodorant is allowed). Please arrive 15 minutes prior to your appointment time.     Follow-Up: At [facility], you and your health needs are our priority.  As part of our continuing mission to provide you with exceptional heart care, we have created designated Provider Care Teams.  These Care Teams include your primary Cardiologist (physician) and Advanced Practice Providers (APPs -  Physician Assistants and Nurse Practitioners) who all work together to provide you with the care you need, when you need it.  We recommend signing up for the patient portal called "MyChart".  Sign up information is provided on this After Visit Summary.  MyChart is used to connect with patients for Virtual Visits (Telemedicine).  Patients are able to view lab/test results, encounter notes, upcoming appointments, etc.  Non-urgent messages can be sent  to your provider as well.   To learn more about what you can do with MyChart, go to [email or web address].    Your next appointment:   6 months

## 2022-12-10 ENCOUNTER — Other Ambulatory Visit: Payer: Self-pay | Admitting: Internal Medicine

## 2022-12-22 ENCOUNTER — Other Ambulatory Visit: Payer: Self-pay | Admitting: Internal Medicine

## 2022-12-25 ENCOUNTER — Other Ambulatory Visit: Payer: Self-pay | Admitting: Internal Medicine

## 2022-12-28 ENCOUNTER — Other Ambulatory Visit: Payer: Self-pay | Admitting: Adult Health

## 2022-12-29 ENCOUNTER — Other Ambulatory Visit: Payer: Self-pay | Admitting: Adult Health

## 2022-12-29 NOTE — Telephone Encounter (Signed)
Pt. Takes Prednisone on a regular basis but needs more refill please advise

## 2023-01-01 ENCOUNTER — Other Ambulatory Visit: Payer: Self-pay | Admitting: Adult Health

## 2023-01-02 ENCOUNTER — Telehealth: Payer: Self-pay | Admitting: Internal Medicine

## 2023-01-02 ENCOUNTER — Other Ambulatory Visit: Payer: Self-pay | Admitting: Internal Medicine

## 2023-01-02 MED ORDER — PREDNISONE 10 MG PO TABS: ORAL_TABLET | ORAL | 5 refills | Status: DC

## 2023-01-02 NOTE — Telephone Encounter (Signed)
Script sent refilling prednisone. I see the comment that family has been calling- I'm sorry. This was the first request message I have seen.

## 2023-01-02 NOTE — Telephone Encounter (Signed)
Called pt to inform him that predniSONE (DELTASONE) 10 MG tablet was sent to [facility] but had to leave detailed message. Nothing further needed.

## 2023-01-02 NOTE — Telephone Encounter (Signed)
Wife calling for a Pred refill. States they have called several times , the daughter has too and the Pharm has sent in requests. Please call wife to advise once called in.   Pharm is [facility] in [place]  Husband is out and he is on it all the time, she said.

## 2023-01-26 ENCOUNTER — Other Ambulatory Visit: Payer: Self-pay | Admitting: Internal Medicine

## 2023-03-10 DIAGNOSIS — E7800 Pure hypercholesterolemia, unspecified: Secondary | ICD-10-CM | POA: Diagnosis not present

## 2023-03-10 DIAGNOSIS — Z950 Presence of cardiac pacemaker: Secondary | ICD-10-CM | POA: Diagnosis not present

## 2023-03-10 DIAGNOSIS — E1142 Type 2 diabetes mellitus with diabetic polyneuropathy: Secondary | ICD-10-CM | POA: Diagnosis not present

## 2023-03-10 DIAGNOSIS — K219 Gastro-esophageal reflux disease without esophagitis: Secondary | ICD-10-CM | POA: Diagnosis not present

## 2023-03-10 DIAGNOSIS — I10 Essential (primary) hypertension: Secondary | ICD-10-CM | POA: Diagnosis not present

## 2023-03-10 DIAGNOSIS — E1151 Type 2 diabetes mellitus with diabetic peripheral angiopathy without gangrene: Secondary | ICD-10-CM | POA: Diagnosis not present

## 2023-03-10 DIAGNOSIS — Z0000 Encounter for general adult medical examination without abnormal findings: Secondary | ICD-10-CM | POA: Diagnosis not present

## 2023-03-10 DIAGNOSIS — I5032 Chronic diastolic (congestive) heart failure: Secondary | ICD-10-CM | POA: Diagnosis not present

## 2023-03-10 DIAGNOSIS — N183 Chronic kidney disease, stage 3 unspecified: Secondary | ICD-10-CM | POA: Diagnosis not present

## 2023-03-10 DIAGNOSIS — I25118 Atherosclerotic heart disease of native coronary artery with other forms of angina pectoris: Secondary | ICD-10-CM | POA: Diagnosis not present

## 2023-03-10 DIAGNOSIS — J449 Chronic obstructive pulmonary disease, unspecified: Secondary | ICD-10-CM | POA: Diagnosis not present

## 2023-03-10 DIAGNOSIS — Z23 Encounter for immunization: Secondary | ICD-10-CM | POA: Diagnosis not present

## 2023-03-12 ENCOUNTER — Encounter (INDEPENDENT_AMBULATORY_CARE_PROVIDER_SITE_OTHER): Payer: 59 | Admitting: Ophthalmology

## 2023-03-12 DIAGNOSIS — I10 Essential (primary) hypertension: Secondary | ICD-10-CM | POA: Diagnosis not present

## 2023-03-12 DIAGNOSIS — H35033 Hypertensive retinopathy, bilateral: Secondary | ICD-10-CM

## 2023-03-12 DIAGNOSIS — H43813 Vitreous degeneration, bilateral: Secondary | ICD-10-CM

## 2023-03-12 DIAGNOSIS — E113291 Type 2 diabetes mellitus with mild nonproliferative diabetic retinopathy without macular edema, right eye: Secondary | ICD-10-CM

## 2023-03-12 DIAGNOSIS — Z7984 Long term (current) use of oral hypoglycemic drugs: Secondary | ICD-10-CM

## 2023-03-12 DIAGNOSIS — E113392 Type 2 diabetes mellitus with moderate nonproliferative diabetic retinopathy without macular edema, left eye: Secondary | ICD-10-CM | POA: Diagnosis not present

## 2023-03-19 NOTE — Progress Notes (Signed)
HPI  M former smoker with COPD, chronic cough, OSA complicated by Cardiac dysrhythmia/ pacemaker, HBP  Office Spirometry 08/11/14-moderate obstruction/restriction-FVC 2.95/62%, FEV1 2.37/65%, ratio 0.79, FEF 25-75% 2.43/77% PFT 10/26/2017-normal flows without response to bronchodilator, /moderate diffusion defect. Walk Test O2 Qualifying-04/26/2018-qualified for portable oxygen-during his second lap heart rate reached 112 and saturation fell to 86% on room air Labs 04/26/2018- IgE 464, EOS wnl HST 05/17/18>> Severe obstructive sleep apnea AHI:33 CPAP to BIPAP titration 02/18/2019-  BIPAP 10/6, if not tolerated try CPAP 7  Cardiac PET 6/25, WNL low risk -----------------------------------------------------------------------------------------    11/13/22- 77 year old male former smoker followed for COPD, restrictive lung disease, cough, OSA, Tracheomalacia, complicated by CAD, CHF, cardiac arrhythmia/Pacemaker/ ICD, HBP, DM  BIPAP 10/6/ [employer] 10 S machine -Trelegy 100, Proair hfa, prednisone 10 mg QD, Singulair, neb Duoneb, Download- compliance 87%, AHI 2.5/ hr Body weight today 215 lbs LOV Parrett, NP 11/04/22-acute COPD exacerbation.> doxycycline, pred taper, Flutter valve. Note recent hosp for altered mentation, hyponatremia, dehydration.  -----Cpap f/u, no concerns  Here with granddaughter.  He says his breathing is nearly back to baseline, finishing doxycycline after treatment here.  Some residual cough but nonproductive now. Download reviewed.  He is comfortable with his BiPAP and sleep is okay. Cardiac PET 6/25, WNL low risk Labs 11/04/22- Na 133, D-dimer 1.15 H, BNP 256 H to continue diuretic. CTaPE 11/05/22- IMPRESSION: 1. No evidence of pulmonary embolism. 2. Small consolidative opacity in the lingula not present on the recent cardiac CT from 10/28/2022 may reflect atelectasis or infection. 3. Mosaic attenuation in the lungs consistent with air trapping/small  airway disease. 4. Inward bowing of the posterior tracheal wall suggestive of dynamic tracheal collapse. Mild bronchial wall thickening may reflect bronchitis. 5. Prominent mediastinal and hilar lymph nodes are nonspecific but similar to the cardiac CT from 2020. 6. Cholelithiasis without evidence of acute cholecystitis.  03/20/23- 77 year old male former smoker (30 pk yrs) followed for COPD, restrictive lung disease, cough, OSA, Tracheomalacia, complicated by CAD, CHF, cardiac arrhythmia/Pacemaker/ ICD, HBP, DM  -Trelegy 100, Proair hfa, prednisone 10 mg QD, Singulair, neb Duoneb, BIPAP 10/6/ [employer] 10 S machine Download- compliance 100%, AHI 4.2/hr Body weight today 218 lbs  Had flu vax. Download reviewed.  He feels comfortable with his BiPAP and sleeps fairly well. Occasionally wakes early and gets up. Has had flu vaccine, RSV and pneumonia vaccines. No changes in his breathing and no acute events.  He is satisfied with current meds and denies need for refill.  ROS-see HPI   + = positive Constitutional:    weight loss, night sweats, fevers, chills, fatigue, lassitude. HEENT:    headaches, difficulty swallowing, tooth/dental problems, sore throat,       sneezing, itching, ear ache, +nasal congestion, +post nasal drip, snoring CV:    +chest pain, orthopnea, PND, swelling in lower extremities, anasarca, dizziness, palpitations Resp:   +shortness of breath with exertion or at rest.                +productive cough,   + non-productive cough, coughing up of blood.              change in color of mucus.  +wheezing.   Skin:    rash or lesions. GI:  No-   heartburn, indigestion, abdominal pain, nausea, vomiting, GU:  MS:   joint pain, stiffness,  Neuro-     nothing unusual Psych:  change in mood or  affect.  depression or anxiety.   memory loss.  OBJ- Physical Exam General- Alert, Oriented, Affect-appropriate, Distress- none acute,  Skin- rash-none, lesions-  none, excoriation- none Lymphadenopathy- none Head- atraumatic            Eyes- Gross vision intact, PERRLA, conjunctivae and secretions clear            Ears- Hearing, canals-normal            Nose- Clear, no-Septal dev, mucus, polyps, erosion, perforation             Throat- Mallampati II , mucosa clear , drainage-none, tonsils- atrophic, edentulous+, not hoarse Neck-  Chest - symmetrical excursion , unlabored           Heart/CV- RRR , no murmur , no gallop  , no rub, nl s1 s2                           - JVD- none , edema- none, stasis changes- none, varices- none           Lung- +coarse, cough-none, dullness-none, rub- none,            Chest wall- +L pacemaker Abd-  Br/ Gen/ Rectal- Not done, not indicated Extrem- cyanosis- none, clubbing, none, atrophy- none, strength- nl, +cane Neuro- grossly intact to observation

## 2023-03-20 ENCOUNTER — Ambulatory Visit (INDEPENDENT_AMBULATORY_CARE_PROVIDER_SITE_OTHER): Payer: 59 | Admitting: Internal Medicine

## 2023-03-20 ENCOUNTER — Encounter: Payer: Self-pay | Admitting: Internal Medicine

## 2023-03-20 VITALS — BP 115/76 | HR 91 | Ht 72.0 in | Wt 218.0 lb

## 2023-03-20 DIAGNOSIS — J4489 Other specified chronic obstructive pulmonary disease: Secondary | ICD-10-CM | POA: Diagnosis not present

## 2023-03-20 DIAGNOSIS — I5032 Chronic diastolic (congestive) heart failure: Secondary | ICD-10-CM

## 2023-03-20 DIAGNOSIS — G4733 Obstructive sleep apnea (adult) (pediatric): Secondary | ICD-10-CM

## 2023-03-20 NOTE — Patient Instructions (Signed)
Glad you are doing well. We can continue BIPAP 10/6 and continue your breathing meds.  If we can help with anything, please let us know.[name]

## 2023-03-20 NOTE — Assessment & Plan Note (Signed)
Benefits from BiPAP with satisfactory compliance and control as discussed.  No changes appropriate.

## 2023-03-20 NOTE — Assessment & Plan Note (Signed)
Has had appropriate vaccinations.  Okay to continue current meds.

## 2023-03-20 NOTE — Assessment & Plan Note (Signed)
Paced rhythm is regular today and he appears no distress with no obvious fluid overload.  Followed by cardiology.

## 2023-04-24 ENCOUNTER — Other Ambulatory Visit: Payer: Self-pay | Admitting: Internal Medicine

## 2023-04-30 ENCOUNTER — Other Ambulatory Visit: Payer: Self-pay | Admitting: Internal Medicine

## 2023-05-11 ENCOUNTER — Other Ambulatory Visit: Payer: Self-pay

## 2023-05-11 MED ORDER — NITROGLYCERIN 0.4 MG SL SUBL: 0.4000 mg | SUBLINGUAL_TABLET | SUBLINGUAL | 2 refills | Status: AC | PRN

## 2023-06-03 ENCOUNTER — Other Ambulatory Visit: Payer: Self-pay | Admitting: Internal Medicine

## 2023-06-05 ENCOUNTER — Other Ambulatory Visit: Payer: Self-pay | Admitting: Internal Medicine

## 2023-06-08 ENCOUNTER — Ambulatory Visit (HOSPITAL_COMMUNITY): Payer: 59 | Attending: Cardiology

## 2023-06-08 DIAGNOSIS — I493 Ventricular premature depolarization: Secondary | ICD-10-CM | POA: Diagnosis not present

## 2023-06-08 LAB — ECHOCARDIOGRAM COMPLETE
Area-P 1/2: 2.24 cm2
S' Lateral: 3.7 cm

## 2023-06-08 MED ORDER — PERFLUTREN LIPID MICROSPHERE
1.0000 mL | INTRAVENOUS | Status: AC | PRN
  Administered 2023-06-08: 2 mL via INTRAVENOUS

## 2023-06-19 ENCOUNTER — Encounter: Payer: Self-pay | Admitting: Internal Medicine

## 2023-07-15 ENCOUNTER — Telehealth: Payer: Self-pay | Admitting: Internal Medicine

## 2023-07-15 NOTE — Telephone Encounter (Signed)
 [name] [name] [name] requesting cb to discuss issues with pt-lightheadedness/dizziness, spacing out??? Wants to discuss and if pt ntbs. Pt was not w/ daughter at time of call

## 2023-07-15 NOTE — Telephone Encounter (Signed)
 Left a message to call back.

## 2023-07-15 NOTE — Telephone Encounter (Signed)
 Called pt daughter who reports yesterday while going up stairs pt stop and stood appeared as if pt not there.  Also reports while at church guys in the back of church with pt reported to her that pt looked like he zoned out.  Pt reported to daughter that he feels like he needs to see the doctor.  Advised daughter to reach out to PCP as symptoms may not be heart related.  Daughter denies a hx of stroke/TIA.  Pt has a hx of DM daughter reports pt does not check blood sugar or BP.  Advised daughter to monitor blood sugar and BP as low readings could cause lightheadedness, dizziness.  Daughter reports will start checking. Advised daughter to monitor pt closely when has episodes of zoning out and if there is any concern call 911 for evaluation.  Daughter expresses understanding.  Scheduled OV with MD for 07/29/23 at 9:20 am.  Strongly advised daughter to contact PCP for OV to assess symptoms.

## 2023-07-15 NOTE — Telephone Encounter (Signed)
 Pt was returning nurse call and is requesting a callback. Please advise.

## 2023-07-25 ENCOUNTER — Other Ambulatory Visit: Payer: Self-pay | Admitting: Internal Medicine

## 2023-07-29 ENCOUNTER — Encounter: Payer: Self-pay | Admitting: Internal Medicine

## 2023-07-29 ENCOUNTER — Ambulatory Visit: Attending: Internal Medicine | Admitting: Internal Medicine

## 2023-07-29 VITALS — BP 138/84 | HR 86 | Ht 72.0 in | Wt 224.0 lb

## 2023-07-29 DIAGNOSIS — I498 Other specified cardiac arrhythmias: Secondary | ICD-10-CM | POA: Diagnosis not present

## 2023-07-29 DIAGNOSIS — I5032 Chronic diastolic (congestive) heart failure: Secondary | ICD-10-CM

## 2023-07-29 DIAGNOSIS — I25118 Atherosclerotic heart disease of native coronary artery with other forms of angina pectoris: Secondary | ICD-10-CM

## 2023-07-29 DIAGNOSIS — I472 Ventricular tachycardia, unspecified: Secondary | ICD-10-CM | POA: Diagnosis not present

## 2023-07-29 DIAGNOSIS — I4719 Other supraventricular tachycardia: Secondary | ICD-10-CM

## 2023-07-29 MED ORDER — MEXILETINE HCL 150 MG PO CAPS: 150.0000 mg | ORAL_CAPSULE | Freq: Three times a day (TID) | ORAL | 3 refills | Status: DC

## 2023-07-29 NOTE — Patient Instructions (Signed)
 Medication Instructions:  Your physician has recommended you make the following change in your medication:  START: mexiletine (Mexitil) 150 mg by mouth 3 times daily  *If you need a refill on your cardiac medications before your next appointment, please call your pharmacy*   Lab Work: NONE  If you have labs (blood work) drawn today and your tests are completely normal, you will receive your results only by: MyChart Message (if you have MyChart) OR A paper copy in the mail If you have any lab test that is abnormal or we need to change your treatment, we will call you to review the results.   Testing/Procedures: NONE   Follow-Up:Next Available  At [facility], you and your health needs are our priority.  As part of our continuing mission to provide you with exceptional heart care, we have created designated Provider Care Teams.  These Care Teams include your primary Cardiologist (physician) and Advanced Practice Providers (APPs -  Physician Assistants and Nurse Practitioners) who all work together to provide you with the care you need, when you need it.    Provider:   You may see Sherryl Manges, MD or one of the following Advanced Practice Providers on your designated Care Team:

## 2023-07-29 NOTE — Progress Notes (Signed)
 Cardiology Office Note:  .    Date:  07/29/2023  ID:  AKHILESH SASSONE, DOB Nov 02, 1945, MRN [id number] PCP: Noberto Retort, MD  [facility] Providers Cardiologist:  Christell Constant, MD Electrophysiologist:  Sherryl Manges, MD     CC: DOD  History of Present Illness: [name]    [name] [name] "[name]" is an 78 year old male with atrial tachycardia, PACs, PVCs, and sinus node dysfunction who presents with near syncope. He is accompanied by his daughter. He was historically managed by Dr. Graciela Husbands for his electrical conditions.  He has experienced episodes of near syncope, described as 'a case of the woozies,' occurring three to four times recently. These episodes involve a sensation of darkness without complete loss of consciousness, with the most recent episode occurring a couple of weeks ago. During one episode at church, he leaned against a door until the sensation passed. His daughter notes that these episodes have occurred multiple times, and there is no record of blood pressure being checked during these episodes, although he recalls low blood pressure during one incident at church.  He has a history of atrial tachycardia, premature atrial contractions (PACs), premature ventricular contractions (PVCs), sinus node dysfunction, and sustained ventricular tachycardia. He is currently on olmesartan (Benicar) 10 mg for blood pressure management. He also has a pacemaker in place. His daughter has provided additional information about his episodes, noting that he has occurred multiple times.  He reports occasional chest pain, for which he has taken nitroglycerin, resulting in some relief after a few minutes. The chest pain is not associated with the near syncope episodes. No recent swelling in his legs and his breathing is 'pretty good.'  Blood pressure readings have been normal to slightly high, with an average of 133/84. He has a history of heart failure with preserved ejection fraction  (HEPF) and chronic obstructive pulmonary disease (COPD).   Relevant histories: .  Social  - he prefers conservative care; his daughter has approached by about more aggressive care during his time ROS: As per HPI.   Studies Reviewed: .   Cardiac Studies & Procedures   ______________________________________________________________________________________________   STRESS TESTS  NM PET CT CARDIAC PERFUSION MULTI W/ABSOLUTE BLOODFLOW 10/28/2022  Narrative   LV perfusion is normal. There is no evidence of ischemia. There is no evidence of infarction.   Rest left ventricular function is normal. Rest EF: 52 %. Stress left ventricular function is normal. Stress EF: 53 %. End diastolic cavity size is normal. End systolic cavity size is normal.   Myocardial blood flow was computed to be 0.30ml/g/min at rest and 2.79ml/g/min at stress. Global myocardial blood flow reserve was 2.70 and was normal.   Coronary calcium was present on the attenuation correction CT images. Mild coronary calcifications were present. Coronary calcifications were present in the left anterior descending artery and left circumflex artery distribution(s).   The study is normal. The study is low risk.   Pacing wires in RA/RV  CLINICAL DATA:  This over-read does not include interpretation of cardiac or coronary anatomy or pathology. The cardiac PET-CT interpretation by the cardiologist is attached.  COMPARISON:  None Available.  FINDINGS: No suspicious nodules, masses, or infiltrates are identified in the visualized portion of the lungs. No pleural fluid seen.  The visualized portions of the mediastinum and chest wall are unremarkable.  IMPRESSION: No significant non-cardiac abnormality identified.   Electronically Signed By: Danae Orleans M.D. On: 10/28/2022 09:48   ECHOCARDIOGRAM  ECHOCARDIOGRAM COMPLETE  06/08/2023  Narrative ECHOCARDIOGRAM REPORT    Patient Name:   [name] [name] Date of Exam:  06/08/2023 Medical Rec #:  [id number]       Height:       72.0 in Accession #:    [id number]      Weight:       218.0 lb Date of Birth:  [birth year]       BSA:          2.210 m Patient Age:    59 years        BP:           115/76 mmHg Patient Gender: M               HR:           81 bpm. Exam Location:  [address]  Procedure: 2D Echo, Cardiac Doppler, Color Doppler and Intracardiac Opacification Agent  Indications:    I49.3 PVC  History:        Patient has prior history of Echocardiogram examinations, most recent 02/01/2021. Pacemaker, COPD; Risk Factors:Hypertension, Diabetes and Dyslipidemia. Bradycardia, Chronic kidney disease. Atrial tachycardia.  Sonographer:    Cathie Beams RCS Referring Phys: [phone] Duke Salvia  IMPRESSIONS   1. Left ventricular ejection fraction, by estimation, is 50 to 55%. The left ventricle has low normal function. The left ventricle demonstrates regional wall motion abnormalities (see scoring diagram/findings for description). There is mild left ventricular hypertrophy. Left ventricular diastolic parameters are consistent with Grade I diastolic dysfunction (impaired relaxation). 2. Right ventricular systolic function is low normal. The right ventricular size is normal. Tricuspid regurgitation signal is inadequate for assessing PA pressure. 3. The mitral valve is normal in structure. Trivial mitral valve regurgitation. No evidence of mitral stenosis. 4. The aortic valve has an indeterminant number of cusps. Aortic valve regurgitation is not visualized. Aortic valve sclerosis is present, with no evidence of aortic valve stenosis.  Comparison(s): A prior study was performed on 02/01/2021. Prior images reviewed side by side. LVEF remains stable at 50-55%, RWMA were not reported on prior studies but they were present, see prior report for details.  FINDINGS Left Ventricle: Left ventricular ejection fraction, by estimation, is 50 to 55%. The left ventricle has  low normal function. The left ventricle demonstrates regional wall motion abnormalities. The left ventricular internal cavity size was normal in size. There is mild left ventricular hypertrophy. Left ventricular diastolic parameters are consistent with Grade I diastolic dysfunction (impaired relaxation).   LV Wall Scoring: The mid and distal anterior septum, mid inferoseptal segment, and apical inferior segment are hypokinetic.  Right Ventricle: The right ventricular size is normal. Right vetricular wall thickness was not well visualized. Right ventricular systolic function is low normal. Tricuspid regurgitation signal is inadequate for assessing PA pressure.  Left Atrium: Left atrial size was normal in size.  Right Atrium: Right atrial size was normal in size.  Pericardium: There is no evidence of pericardial effusion.  Mitral Valve: The mitral valve is normal in structure. Trivial mitral valve regurgitation. No evidence of mitral valve stenosis.  Tricuspid Valve: The tricuspid valve is normal in structure. Tricuspid valve regurgitation is trivial. No evidence of tricuspid stenosis.  Aortic Valve: The aortic valve has an indeterminant number of cusps. Aortic valve regurgitation is not visualized. Aortic valve sclerosis is present, with no evidence of aortic valve stenosis.  Pulmonic Valve: The pulmonic valve was not well visualized. Pulmonic valve regurgitation is trivial. No evidence of pulmonic stenosis.  Aorta: The aortic root and ascending aorta are structurally normal, with no evidence of dilitation.  IAS/Shunts: No atrial level shunt detected by color flow Doppler.  Additional Comments: A device lead is visualized.   LEFT VENTRICLE PLAX 2D LVIDd:         5.10 cm   Diastology LVIDs:         3.70 cm   LV e' medial:    3.73 cm/s LV PW:         1.10 cm   LV E/e' medial:  13.8 LV IVS:        1.20 cm   LV e' lateral:   5.92 cm/s LVOT diam:     2.10 cm   LV E/e' lateral: 8.7 LV  SV:         47 LV SV Index:   21 LVOT Area:     3.46 cm   RIGHT VENTRICLE RV S prime:     10.20 cm/s TAPSE (M-mode): 2.3 cm  LEFT ATRIUM             Index        RIGHT ATRIUM          Index LA diam:        3.80 cm 1.72 cm/m   RA Area:     7.69 cm LA Vol (A2C):   37.4 ml 16.92 ml/m  RA Volume:   14.00 ml 6.33 ml/m LA Vol (A4C):   50.2 ml 22.71 ml/m LA Biplane Vol: 46.3 ml 20.95 ml/m AORTIC VALVE LVOT Vmax:   73.40 cm/s LVOT Vmean:  48.100 cm/s LVOT VTI:    0.137 m  AORTA Ao Root diam: 3.50 cm Ao Asc diam:  3.70 cm  MITRAL VALVE MV Area (PHT): 2.24 cm    SHUNTS MV Decel Time: 338 msec    Systemic VTI:  0.14 m MV E velocity: 51.40 cm/s  Systemic Diam: 2.10 cm MV A velocity: 62.40 cm/s MV E/A ratio:  0.82  Sunit Tolia Electronically signed by Tessa Lerner Signature Date/Time: 06/08/2023/2:03:37 PM    Final      CT SCANS  CT CORONARY MORPH W/CTA COR W/SCORE 03/01/2019  Addendum 03/01/2019  7:16 PM ADDENDUM REPORT: 03/01/2019 19:14  CLINICAL DATA:  67M with sick sinus syndrome, prior tobacco abuse, COPD, hypertension, diabetes, CKD and chest pain.  EXAM: Cardiac/Coronary  CT  TECHNIQUE: The patient was scanned on a [employer].  FINDINGS: A 120 kV prospective scan was triggered in the descending thoracic aorta at 111 HU's. Axial non-contrast 3 mm slices were carried out through the heart. The data set was analyzed on a dedicated work station and scored using the Agatson method. Gantry rotation speed was 250 msecs and collimation was .6 mm. No beta blockade and 0.8 mg of sl NTG was given. The 3D data set was reconstructed in 5% intervals of the 67-82 % of the R-R cycle. Diastolic phases were analyzed on a dedicated work station using MPR, MIP and VRT modes. The patient received 80 cc of contrast.  Aorta: Normal size. Ascending aorta 3.3 cm. Mild calcification of the descending aorta. No dissection.  Aortic Valve:  Trileaflet.  No  calcifications.  Coronary Arteries:  Normal coronary origin.  Right dominance.  RCA is a small, non-dominant artery.  There is no plaque.  Left main is a large artery that gives rise to LAD and LCX arteries. Minimal (<25%) calcified plaque.  LAD is a large vessel. There is minimal (<25%) mixed plaque  in the ostium and mild (25-49%) mixed plaque proximally. There is a short segment of myocardial bridging distally. There is a large, branching D1 with minimal calcified plaque proximally and mild calcified plaque at the bifurcation. D2 and D3 are small and without significant stenosis.  LCX is a large, dominant artery that gives rise to one large OM1 branch. There is scattered minimal calcified plaque. There is a small OM1, medium OM2, large OM3 and small OM4 without evidence of significant disease.  Other findings:  Normal pulmonary vein drainage into the left atrium.  Normal let atrial appendage without a thrombus.  Normal size of the pulmonary artery.  Dual chamber pacemaker noted in the right atrium and right ventricle.  IMPRESSION: 1. Coronary calcium score of 117. This was 36th percentile for age and sex matched control.  2. Normal coronary origin with left dominance.  3. No non-obstructive CAD.  Chilton Si, MD   Electronically Signed By: Chilton Si On: 03/01/2019 19:14  Narrative EXAM: OVER-READ INTERPRETATION  CT CHEST  The following report is an over-read performed by radiologist Dr. Charlett Nose of [facility] on 03/01/2019. This over-read does not include interpretation of cardiac or coronary anatomy or pathology. The coronary CTA interpretation by the cardiologist is attached.  COMPARISON:  None.  FINDINGS: Vascular: Heart is normal size.  Visualized aorta normal caliber.  Mediastinum/Nodes: No adenopathy in the lower mediastinum or hila.  Lungs/Pleura: No confluent opacities or effusions.  Upper Abdomen: Imaging into  the upper abdomen shows no acute findings.  Musculoskeletal: Chest wall soft tissues are unremarkable. No acute bony abnormality.  IMPRESSION: No acute or significant extracardiac abnormality.  Electronically Signed: By: Charlett Nose M.D. On: 03/01/2019 15:01     ______________________________________________________________________________________________       Risk Assessment/Calculations:         Physical Exam:    VS:  BP 138/84 (BP Location: Right Arm, Patient Position: Sitting, Cuff Size: Normal)   Pulse 86   Ht 6' (1.829 m)   Wt 101.6 kg   SpO2 95%   BMI 30.38 kg/m    Wt Readings from Last 3 Encounters:  07/29/23 101.6 kg  03/20/23 98.9 kg  12/05/22 99.2 kg    Gen: no distress   Neck: No JVD Cardiac: No Rubs or Gallops, Systolic Murmur, RRR +2 Respiratory: Clear to auscultation bilaterally, normal effort, normal  respiratory rate GI: Soft, nontender, non-distended  MS: No  edema;  moves all extremities Integument: Skin feels warm Neuro:  At time of evaluation, alert and oriented to person/place/time/situation  Psych: Normal affect, patient feels well   ASSESSMENT AND PLAN: .    Mr. Hammerschmidt is a gentleman with significant arrhythmia, including frequent PACs and sustained ventricular tachycardia, as well as frequent atrial tachycardia and known sinus node dysfunction status post pacemaker. We are going to have his device interrogated today. Dr. Graciela Husbands had recommended he start mexilitene, and if this is the case, we will proceed with that. He has shown no evidence of hypotension given his elevated blood pressure right now, and I will not attempt to decrease his blood pressure medications. His ambulatory blood pressure log has been reviewed. A recent echocardiogram did not show evidence of significant valve abnormality and revealed stable function. For these reasons, we will not back off on his current therapy.   Near syncope Episodes of near syncope  characterized by a sensation of darkness without complete loss of consciousness occurred three to four times recently, with the last episode  a couple of weeks ago. Differential diagnosis includes cardiac arrhythmias, hypotension, or non-cardiac causes. Blood pressure readings are normal to slightly high, making hypotension less likely. Arrhythmias are considered, given his atrial and ventricular tachycardia. - Device interrogation notes significant NSVT; will start AAD  Atrial tachycardia and ventricular tachycardia Atrial and sustained ventricular tachycardia managed with a [employer] pacemaker. Plan to evaluate the device for arrhythmias contributing to near syncope.  - continue current therapy  CAD Occasional chest pain relieved by nitroglycerin, not associated with near syncope. The nature of episodes makes coronary artery disease less likely. - Use nitroglycerin as needed for chest pain and IMDUR  Hypertension Blood pressure readings are normal to slightly high, averaging 133/84 mmHg. Currently on olmesartan 10 mg. Consider adjusting antihypertensive therapy if hypotension is suspected as a cause of near syncope, but current readings do not support this. - Continue olmesartan 10 mg as blood pressure is within acceptable range.   Heart failure with preserved ejection fraction (HFpEF) HFpEF with no report of leg swelling and good breathing, indicating well-managed heart failure status. - Continue current management and monitor for heart failure exacerbation symptoms.  Chronic obstructive pulmonary disease (COPD) COPD with good breathing, indicating well-managed status. - Continue current management and monitor for COPD exacerbation symptoms.  Needs EP f/u   Time Spent Directly with Patient:   I have spent a total of 50 minutes with the patient reviewing notes, imaging, EKGs, labs, and examining the patient as well as establishing an assessment and plan that was discussed personally with  the patient. Reviewed his device interrogation.    Riley Lam, MD FASE [facility] Cardiologist [facility]  [address] [place], [address] [place], [place] [address] [phone]  10:28 AM

## 2023-07-31 ENCOUNTER — Telehealth: Payer: Self-pay

## 2023-07-31 NOTE — Telephone Encounter (Signed)
 Request received to interrogate patients device while seeing gen cards this (07/29/23) morning. (See full interrogation report in PACEART and exported to SK/epic).   Normal in-clinic DUAL chamber pacemaker check. Presenting Rhythm: AP/VS 71 w/ frequent PVC's. Patient in acutely today to see gen cards: Dr. Izora Ribas for dizziness, near syncopal events over past 3-4 weeks.  Testing of thresholds, sensing, and impedance demonstrate stable parameters and no programming changes needed at this time. Patient is past due follow up with Dr. Graciela Husbands regarding ventricular arrhythmia hx.   *There are 118 HVR episodes since August.  Numerous NSVT events occuring since 06/12/23, longest in duration is 27 seconds.  V rates avg 150 - max 208bpm.  These events correlate with patient symptoms.  Also, high PVC burden with elevated rates shown on HG's.    Reviewed with Dr. Izora Ribas.  He plans to start patient on Mexilitene per Dr. Koren Bound previous plan and have patient follow up with [place] in EP clinic.    Estimated longevity 4.5 years . Pt enrolled in remote follow-up.

## 2023-08-19 ENCOUNTER — Encounter (HOSPITAL_COMMUNITY): Payer: Self-pay

## 2023-08-19 ENCOUNTER — Emergency Department (HOSPITAL_COMMUNITY)

## 2023-08-19 ENCOUNTER — Ambulatory Visit: Payer: Self-pay | Admitting: Internal Medicine

## 2023-08-19 ENCOUNTER — Other Ambulatory Visit: Payer: Self-pay

## 2023-08-19 ENCOUNTER — Emergency Department (HOSPITAL_COMMUNITY): Admission: EM | Admit: 2023-08-19 | Discharge: 2023-08-20 | Discharge status: Against Medical Advice

## 2023-08-19 DIAGNOSIS — Z5321 Procedure and treatment not carried out due to patient leaving prior to being seen by health care provider: Secondary | ICD-10-CM | POA: Insufficient documentation

## 2023-08-19 DIAGNOSIS — Z950 Presence of cardiac pacemaker: Secondary | ICD-10-CM | POA: Insufficient documentation

## 2023-08-19 DIAGNOSIS — R079 Chest pain, unspecified: Secondary | ICD-10-CM | POA: Diagnosis not present

## 2023-08-19 DIAGNOSIS — R0789 Other chest pain: Secondary | ICD-10-CM | POA: Diagnosis not present

## 2023-08-19 DIAGNOSIS — R0602 Shortness of breath: Secondary | ICD-10-CM | POA: Diagnosis not present

## 2023-08-19 DIAGNOSIS — R0902 Hypoxemia: Secondary | ICD-10-CM | POA: Insufficient documentation

## 2023-08-19 LAB — COMPREHENSIVE METABOLIC PANEL WITH GFR
ALT: 43 U/L (ref 0–44)
AST: 46 U/L — ABNORMAL HIGH (ref 15–41)
Albumin: 4.2 g/dL (ref 3.5–5.0)
Alkaline Phosphatase: 49 U/L (ref 38–126)
Anion gap: 13 (ref 5–15)
BUN: 25 mg/dL — ABNORMAL HIGH (ref 8–23)
CO2: 22 mmol/L (ref 22–32)
Calcium: 9 mg/dL (ref 8.9–10.3)
Chloride: 100 mmol/L (ref 98–111)
Creatinine, Ser: 1.5 mg/dL — ABNORMAL HIGH (ref 0.61–1.24)
GFR, Estimated: 47 mL/min — ABNORMAL LOW (ref >60)
Glucose, Bld: 173 mg/dL — ABNORMAL HIGH (ref 70–99)
Potassium: 3.4 mmol/L — ABNORMAL LOW (ref 3.5–5.1)
Sodium: 135 mmol/L (ref 135–145)
Total Bilirubin: 0.9 mg/dL (ref 0.0–1.2)
Total Protein: 7.1 g/dL (ref 6.5–8.1)

## 2023-08-19 LAB — RESP PANEL BY RT-PCR (RSV, FLU A&B, COVID)  RVPGX2
Influenza A by PCR: NEGATIVE
Influenza B by PCR: NEGATIVE
Resp Syncytial Virus by PCR: NEGATIVE
SARS Coronavirus 2 by RT PCR: NEGATIVE

## 2023-08-19 LAB — CBC
HCT: 46.4 % (ref 39.0–52.0)
Hemoglobin: 15.9 g/dL (ref 13.0–17.0)
MCH: 32.8 pg (ref 26.0–34.0)
MCHC: 34.3 g/dL (ref 30.0–36.0)
MCV: 95.7 fL (ref 80.0–100.0)
Platelets: 194 10*3/uL (ref 150–400)
RBC: 4.85 MIL/uL (ref 4.22–5.81)
RDW: 12.9 % (ref 11.5–15.5)
WBC: 13.2 10*3/uL — ABNORMAL HIGH (ref 4.0–10.5)
nRBC: 0 % (ref 0.0–0.2)

## 2023-08-19 LAB — TROPONIN I (HIGH SENSITIVITY): Troponin I (High Sensitivity): 7 ng/L (ref <18)

## 2023-08-19 LAB — BRAIN NATRIURETIC PEPTIDE: B Natriuretic Peptide: 32.4 pg/mL (ref 0.0–100.0)

## 2023-08-19 NOTE — ED Notes (Signed)
 Pt called multiple times to be brought back, pt did not reply back.

## 2023-08-19 NOTE — Telephone Encounter (Signed)
 Copied from CRM [id number]. Topic: Clinical - Red Word Triage >> Aug 19, 2023  2:50 PM Theodis Sato wrote: Red Word that prompted transfer to Nurse Triage: Patients daughter is the phone stating the patient is having a hard time breathing and oxygen dropped down to 82 but did come back up to about 46 - patients daughter is concerned about a blood clot   Chief Complaint: Shortness of Breath Symptoms: Difficulty breathing, chest pain Frequency: Since after mowing the lawn today exerting himself. Patient did admit to his daughter that he has been feeling short of breath for the past few days. Pertinent Negatives: Patient denies fever, being bitten or stung by anything that they are aware of, dizziness. Disposition: [x] ED /[] Urgent Care (no appt availability in office) / [] Appointment(In office/virtual)/ []  [facility]/ [] Home Care/ [] Refused Recommended Disposition /[]  Mobile Bus/ []  Follow-up with PCP Additional Notes: Patient's daughter called and advised that her father was outside with the riding lawnmower and then using the push mower. Patient checked his Oxygen with a pulse oximetry it was in the 80s per the daughter.  It was then around 91% after resting. Patient is using an Oxygen tank of his wife's to help and his Oxygen level is up to 95% on 3 liters. She states that his Oxygen levels are normally within normal limits without Oxygen. She is concerned for a blood clot. He doesn't normally use Oxygen. She states that he doesn't have a fever that she is aware of, hasn't been bitten or stung by anything that she knows of, and doesn't have any dizziness that she is aware of. Patient also had chest pain earlier today and he took two Nitroglycerin tablets at that time. Daughter is advised that at this time, it is recommended that the patient goes to the Emergency Room for further evaluation. This RN asked the daughter if she wanted an ambulance called to take him to the  Emergency Room.  She declined at this time and she states that she is going to talk to him and strongly suggest that he goes to the Emergency Room at this time. Daughter is not currently with the patient.  She was on her way to work.  She is going to see if she can get her father to go to the hospital when she talks to him/gets to him.  She is advised that if she sees or speaks to him and he sounds worse or she feels like he needs an ambulance, to go ahead and call 911.  She is also advised that if they are on the way to the Emergency Room and they need help on the way, they can call 911 and an ambulance can meet them.  She verbalized understanding and said she is going to try to get him to go to the Emergency Room.  She is also advised to call us back if he refuses to go.  She verbalized understanding of this as well.   Reason for Disposition  Oxygen level (e.g., pulse oximetry) 90 percent or lower  Answer Assessment - Initial Assessment Questions E2C2 Pulmonary Triage - Initial Assessment Questions "Chief Complaint (e.g., cough, sob, wheezing, fever, chills, sweat or additional symptoms) *Go to specific symptom protocol after initial questions. Shortness of breath  "How long have symptoms been present?" Since after mowing the lawn  Have you tested for COVID or Flu? Note: If not, ask patient if a home test can be taken. If so, instruct patient to call back  for positive results. No  MEDICINES:   "Have you used any OTC meds to help with symptoms?" No If yes, ask "What medications?" N/A  "Have you used your inhalers/maintenance medication?" No If yes, "What medications?" N/A  If inhaler, ask "How many puffs and how often?" Note: Review instructions on medication in the chart. N/A  OXYGEN: "Do you wear supplemental oxygen?" No If yes, "How many liters are you supposed to use?" N/A  "Do you monitor your oxygen levels?" Yes If yes, "What is your reading (oxygen level) today?" Used a  pulse oximetry today it was in the 80s and then it hung around 91%  "What is your usual oxygen saturation reading?"  (Note: Pulmonary O2 sats should be 90% or greater) unsure     1. RESPIRATORY STATUS: "Describe your breathing?" (e.g., wheezing, shortness of breath, unable to speak, severe coughing)      Short of breath, low Oxygen saturation after mowing the lawn 2. ONSET: "When did this breathing problem begin?"      Today after mowing the lawn 3. PATTERN "Does the difficult breathing come and go, or has it been constant since it started?"      Constant 4. SEVERITY: "How bad is your breathing?" (e.g., mild, moderate, severe)    - MILD: No SOB at rest, mild SOB with walking, speaks normally in sentences, can lie down, no retractions, pulse < 100.    - MODERATE: SOB at rest, SOB with minimal exertion and prefers to sit, cannot lie down flat, speaks in phrases, mild retractions, audible wheezing, pulse 100-120.    - SEVERE: Very SOB at rest, speaks in single words, struggling to breathe, sitting hunched forward, retractions, pulse > 120      Severe 5. RECURRENT SYMPTOM: "Have you had difficulty breathing before?" If Yes, ask: "When was the last time?" and "What happened that time?"      No 6. CARDIAC HISTORY: "Do you have any history of heart disease?" (e.g., heart attack, angina, bypass surgery, angioplasty)      Significant 7. LUNG HISTORY: "Do you have any history of lung disease?"  (e.g., pulmonary embolus, asthma, emphysema)     COPD 8. CAUSE: "What do you think is causing the breathing problem?"      Unsure 9. OTHER SYMPTOMS: "Do you have any other symptoms? (e.g., dizziness, runny nose, cough, chest pain, fever)     Chest Pain--patient took two nitroglycerin tablets this morning 10. O2 SATURATION MONITOR:  "Do you use an oxygen saturation monitor (pulse oximeter) at home?" If Yes, ask: "What is your reading (oxygen level) today?" "What is your usual oxygen saturation reading?"  (e.g., 95%)       unsure 12. TRAVEL: "Have you traveled out of the country in the last month?" (e.g., travel history, exposures)       N/a  Protocols used: Breathing Difficulty-A-AH

## 2023-08-19 NOTE — ED Triage Notes (Addendum)
 Pt took 2 nitroglycerin pills and family member checked Sa02 and it showed he was 82% on RA this morning. Pt was put on pt's wife's oxygen at 3L his Sa02 was 94%. Pt c/o SOBx2-3d. Pt states he feels weak right now. Pt denies chest pain at present, but had bad chest pain this morning and that's why he took the nitro

## 2023-08-19 NOTE — ED Provider Triage Note (Signed)
 Emergency Medicine Provider Triage Evaluation Note  [name] [name] , a 78 y.o. male  was evaluated in triage.  Pt complains of CP, hypoxia, SHOB. Hx of pacemaker  CP started this morning. Described as "hard pain". 8-9/10. Took 2 NTG and resolved pain. TTE 06/08/23 50-55%  SHOB for 4-5 days at rest. Has been sleeping with normal amount of pillows. No new cough, congestion, fever.  Took O2 level at home and noted to be in "80s" - placed patient on 3L and improved O2 to 95%. Does not wear O2 at home. Inhaler 2-3x day for past 4-5 days. Normally doesn't have to use rescue inhaler Normally uses neb tx 1-2x a day. Recently has been using every 4 hrs  No pedal edema.  Review of Systems  Positive: See HPI Negative:   Physical Exam  BP 112/71   Pulse 67   Temp 97.9 F (36.6 C) (Oral)   Resp 18   Ht 6' (1.829 m)   Wt 101.6 kg   SpO2 93%   BMI 30.38 kg/m  Gen:   Awake, no distress   Resp:  Normal effort . Lung sounds CTAB MSK:   Moves extremities without difficulty  Other:    Medical Decision Making  Medically screening exam initiated at 5:21 PM.  Appropriate orders placed.  [name] [name] was informed that the remainder of the evaluation will be completed by another provider, this initial triage assessment does not replace that evaluation, and the importance of remaining in the ED until their evaluation is complete.  CP workup started   Royann Cords, [place] 08/19/23 1728

## 2023-08-20 ENCOUNTER — Ambulatory Visit: Payer: Self-pay | Admitting: Internal Medicine

## 2023-08-20 NOTE — Telephone Encounter (Signed)
 [name] [name], Please advise, he is refusing ED.  We have openings, however, he is having to take nitroglycerine for chest pain and is having sob unlike other flare up.  Please advise.  Thank you.

## 2023-08-20 NOTE — Telephone Encounter (Addendum)
 Copied from CRM [id number]. Topic: Clinical - Red Word Triage >> Aug 20, 2023  8:02 AM [name] [name] wrote: Red Word that prompted transfer to Nurse Triage: unable to breathe/ O2 Low 90's  TRIAGE SUMMARY NOTE: Pt daughter reporting that pt been experiencing more SOB than usual for the past couple days, oxygen levels going low, "as low as 82% at one point" yesterday, fatigued, not coughing like his usual COPD exacerbations, daughter confirms this is unlike all other exacerbations for pt. Pt daughter confirms pt is also experiencing "really bad" chest pains at times, usually pt takes 1 nitroglycerin at a time but yesterday took 2 at once. Daughter reporting that pt had not been using his inhalers or nebulizers much but used nebulizer 2x yesterday and didn't make difference. Daughter confirms that pt's chest pain is likely lasting longer than 5 min since he took 2 nitrogylcerin at once. Daughter reporting pt's wife put her own oxygen therapy on pt yesterday at 3L and pulse ox went up to 95% then dropped to 91% while still on wife's oxygen. Daughter reporting they tried ED last night but left because of the wait, daughter requesting appt today with pulm. Advised call 911 for symptoms. Daughter refusing at this time, requesting appt. Advised that sending HP message over to pulm office for them to receive call back for next steps, daughter verbalized understanding. Please advise.  E2C2 Pulmonary Triage - Initial Assessment Questions "Chief Complaint (e.g., cough, sob, wheezing, fever, chills, sweat or additional symptoms) *Go to specific symptom protocol after initial questions. Trouble breathing, oxygen levels low As low as 82% at one point yesterday Seems different, can tell having little bit trouble breathing, having some problems Sometimes from when he's outside Not a lot of coughing or coughing anything up Catching his breath more often Think constant Used 2 nitroglycerin for really bad chest pains  yesterday Tired, sitting in the chair Mowed the lawn on sitting lawn mower, then did push mow, no mask on, don't know if that is the problem Not coughing every breath like he would be for usual COPD exacerbation, different from any other one Went to ED room last night and waited for 4 hours then left because of the wait Think he needs to be seen  "How long have symptoms been present?" Know since yesterday, may have been the day before that  "Have you used your inhalers/maintenance medication?" Yes If yes, "What medications?" Has inhalers and nebulizer, not really using them, then used them and didn't seem to make a difference, not sure used his rescue inhaler  If inhaler, ask "How many puffs and how often?" Note: Review instructions on medication in the chart. Used rescue nebulizer 2x yesterday  OXYGEN: "Do you wear supplemental oxygen?" No If yes, "How many liters are you supposed to use?" Wife tried to put him on oxygen yesterday, put him on 3L went up to 95% then down to 91% even on oxygen  "Do you monitor your oxygen levels?" Yes If yes, "What is your reading (oxygen level) today?" 91%  "What is your usual oxygen saturation reading?"  (Note: Pulmonary O2 sats should be 90% or greater) 95-96%  Reason for Disposition  [1] Chest pain lasts > 5 minutes AND [2] age > 43  Protocols used: Chest Pain-A-AH

## 2023-08-20 NOTE — Telephone Encounter (Signed)
 Emergency room does appear that patient left prior to workup being completed.  If patient is having chest pain requiring nitroglycerin with his cardiac history he needs to go to the emergency room . Would also recommend contacting cardiology. Also if his oxygen levels are dropping down to 82%.  Patient is not on oxygen it sounds as if he is having a flareup and needs further treatment and evaluation.  This cannot be completed over the phone.  Recommend that he seek emergency room care.  Please contact office for sooner follow up if symptoms do not improve or worsen or seek emergency care

## 2023-08-20 NOTE — Telephone Encounter (Signed)
 I called and spoke to pt's daughter, Corbin Dess. (DPR) I informed Corbin Dess of Tammy's note and Corbin Dess stated that the pt waited 4 hours and doubts he will go back to the ER again. I informed Corbin Dess that the best thing for the pt is emergency care. Corbin Dess stated that the pt's O2 levels were at 91-92% and if things worsened, she would take him somewhere or would have the pt "toughen it out" until his upcoming appointment with Dr Rodolfo Clan. NFN

## 2023-08-26 ENCOUNTER — Ambulatory Visit: Attending: Internal Medicine | Admitting: Internal Medicine

## 2023-08-26 ENCOUNTER — Encounter: Payer: Self-pay | Admitting: Internal Medicine

## 2023-08-26 VITALS — BP 102/60 | HR 75 | Ht 72.0 in | Wt 223.0 lb

## 2023-08-26 DIAGNOSIS — I472 Ventricular tachycardia, unspecified: Secondary | ICD-10-CM

## 2023-08-26 DIAGNOSIS — Z950 Presence of cardiac pacemaker: Secondary | ICD-10-CM | POA: Diagnosis not present

## 2023-08-26 DIAGNOSIS — I495 Sick sinus syndrome: Secondary | ICD-10-CM

## 2023-08-26 DIAGNOSIS — I493 Ventricular premature depolarization: Secondary | ICD-10-CM | POA: Diagnosis not present

## 2023-08-26 DIAGNOSIS — I4719 Other supraventricular tachycardia: Secondary | ICD-10-CM | POA: Diagnosis not present

## 2023-08-26 MED ORDER — METOPROLOL TARTRATE 25 MG PO TABS: 25.0000 mg | ORAL_TABLET | Freq: Two times a day (BID) | ORAL | 3 refills | Status: AC

## 2023-08-26 NOTE — Patient Instructions (Signed)
 Medication Instructions:  Your physician has recommended you make the following change in your medication:   ** Stop Benicar  ** Stop Metoprolol  50mg   ** Begin Metoprolol  Tartrate 25mg  - 1 tablet by mouth twice daily    *If you need a refill on your cardiac medications before your next appointment, please call your pharmacy*  Lab Work: BMET with PCP in 2 weeks If you have labs (blood work) drawn today and your tests are completely normal, you will receive your results only by: MyChart Message (if you have MyChart) OR A paper copy in the mail If you have any lab test that is abnormal or we need to change your treatment, we will call you to review the results.  Testing/Procedures: None ordered.   Follow-Up: At [facility], you and your health needs are our priority.  As part of our continuing mission to provide you with exceptional heart care, our providers are all part of one team.  This team includes your primary Cardiologist (physician) and Advanced Practice Providers or APPs (Physician Assistants and Nurse Practitioners) who all work together to provide you with the care you need, when you need it.  Your next appointment:   12 months      1st Floor: - Lobby - Registration  - Pharmacy  - Lab - Cafe  2nd Floor: - PV Lab - Diagnostic Testing (echo, CT, nuclear med)  3rd Floor: - Vacant  4th Floor: - TCTS (cardiothoracic surgery) - AFib Clinic - Structural Heart Clinic - Vascular Surgery  - Vascular Ultrasound  5th Floor: - HeartCare Cardiology (general and EP) - Clinical Pharmacy for coumadin, hypertension, lipid, weight-loss medications, and med management appointments    Valet parking services will be available as well.

## 2023-08-26 NOTE — Progress Notes (Signed)
 Patient Care Team: Roselind Congo, MD as PCP - General (Family Medicine) Verona Goodwill, MD as PCP - Electrophysiology (Cardiology) Jann Melody, MD as PCP - Cardiology (Cardiology)   HPI:  [name] [name] is a 78 y.o. male Seen in followup for pacemaker implanted Medtronic  for chronotropic incompetence and sinus node dysfunction 2007  by Dr. Juna Oka.   gen replacement 2/17   Now with PVCs, freq LBBB sup axis, with normal TW anteriorly  Intercurrently seen by [facility] for near syncope-recurrent  The patient denies nocturnal dyspnea, orthopnea or peripheral edema.  There have been no palpitations or syncope.  Complains of significant dyspnea on exertion oxygen saturations have been noted to be as low as the high 70s.  Sometimes he uses his wife's oxygen.  Some cough.   Episodes of lightheadedness, particularly with prolonged standing. Episodes of chest pain for which he took nitroglycerin .. ..   DATE TEST EF   9/14 MYOVIEW     % No ischemia  4/16 Echo  55-60 %   10/21 CTA  CaScore 117 No obstruc disease  1/22 Echo  60-65% Ao Asc 43 mm  6/24 PET  52% No ischemia  2/25 Echo  50-55%    . Date Cr K Na Hgb  8/23 1.37 3.4  14.7   7/24 1.15 3.8 136<<124 13.1  4/25 1.5 3.4  15.9      Past Medical History:  Diagnosis Date   Arthritis    Bradycardia    pacemaker - Medtronic Adapta #[id number] - December 22, 2005   Carpal tunnel syndrome on both sides    Carpal tunnel syndrome, bilateral    Chronic back pain    Chronic kidney disease    COPD (chronic obstructive pulmonary disease) (HCC)    Depression    Diabetes mellitus    type II controled with diet   GERD (gastroesophageal reflux disease)    Headache(784.0)    Hyperlipidemia    Hypertension    Hypertensive retinopathy    Insomnia    Neuromuscular disorder (HCC)    peripheral neuropathy BLE   Pneumonia    Presence of permanent cardiac pacemaker    Wears glasses     Past Surgical History:  Procedure  Laterality Date   BACK SURGERY     CARPAL TUNNEL RELEASE Right 03/24/2017   Procedure: Right CARPAL TUNNEL RELEASE;  Surgeon: Manya Sells, MD;  Location: [facility] OR;  Service: Neurosurgery;  Laterality: Right;  Right CARPAL TUNNEL RELEASE   CATARACT EXTRACTION     EP IMPLANTABLE DEVICE N/A 06/27/2015   Procedure:  PPM Generator Changeout;  Surgeon: Verona Goodwill, MD;  Location: [facility] INVASIVE CV LAB;  Service: Cardiovascular;  Laterality: N/A;   EYE SURGERY     HEMORRHOID SURGERY     INSERT / REPLACE / REMOVE PACEMAKER     MULTIPLE TOOTH EXTRACTIONS     PACEMAKER INSERTION  ?2007   TOTAL HIP ARTHROPLASTY Right 01/25/2013   Procedure: TOTAL HIP ARTHROPLASTY ANTERIOR APPROACH;  Surgeon: Alphonzo Ask, MD;  Location: [facility] OR;  Service: Orthopedics;  Laterality: Right;   ULNAR NERVE TRANSPOSITION Right 03/24/2017   Procedure: Right Ulnar nerve release;  Surgeon: Manya Sells, MD;  Location: [facility] OR;  Service: Neurosurgery;  Laterality: Right;  Right Ulnar nerve release    Current Outpatient Medications  Medication Sig Dispense Refill   albuterol  (VENTOLIN  HFA) 108 (90 Base) MCG/ACT inhaler Inhale 2 puffs into the lungs every 6 (six) hours as needed  for wheezing or shortness of breath.     aspirin  81 MG tablet Take 81 mg by mouth daily.     atorvastatin  (LIPITOR ) 80 MG tablet TAKE 1 TABLET BY MOUTH ONCE  DAILY 100 tablet 2   CALCIUM -MAGNESIUM -ZINC  PO Take 1 tablet by mouth daily.     diltiazem  (CARDIZEM  SR) 120 MG 12 hr capsule Take 1 capsule by mouth once daily 90 capsule 2   docusate sodium  (COLACE) 100 MG capsule Take 100 mg by mouth daily.     ezetimibe  (ZETIA ) 10 MG tablet Take 1 tablet by mouth once daily 90 tablet 1   famotidine  (PEPCID ) 20 MG tablet TAKE ONE TABLET BY MOUTH ONCE DAILY AT BEDTIME (Patient taking differently: Take 20 mg by mouth at bedtime.) 30 tablet 0   furosemide  (LASIX ) 40 MG tablet Take 1 tablet by mouth once daily 30 tablet 1   gabapentin  (NEURONTIN ) 300 MG capsule  Take 300 mg by mouth 2 (two) times daily.     glimepiride  (AMARYL ) 2 MG tablet Take 2 mg by mouth daily with breakfast.     Guaifenesin  (MUCINEX  MAXIMUM STRENGTH) 1200 MG TB12 Take 1,200 mg by mouth 2 (two) times daily.     ipratropium-albuterol  (DUONEB) 0.5-2.5 (3) MG/3ML SOLN Inhale 3 mLs into the lungs every 6 (six) hours as needed (wheezing or shortness of breath). 360 mL 12   isosorbide  mononitrate (IMDUR ) 60 MG 24 hr tablet Take 1 tablet by mouth once daily 90 tablet 0   JARDIANCE  10 MG TABS tablet TAKE 1 TABLET BY MOUTH ONCE DAILY BEFORE BREAKFAST 90 tablet 0   levocetirizine (XYZAL ) 5 MG tablet Take 5 mg at bedtime by mouth.      metoprolol  tartrate (LOPRESSOR ) 50 MG tablet Take 1 tablet by mouth twice daily 180 tablet 3   montelukast  (SINGULAIR ) 10 MG tablet Take 10 mg by mouth daily.     Multiple Vitamin (MULTIVITAMIN WITH MINERALS) TABS tablet Take 1 tablet by mouth every other day.     nitroGLYCERIN  (NITROSTAT ) 0.4 MG SL tablet Place 1 tablet (0.4 mg total) under the tongue every 5 (five) minutes as needed for chest pain. DO NOT EXCEED A TOTAL OF 3 DOSES IN 15 MINUTES 25 tablet 2   olmesartan (BENICAR) 20 MG tablet Take 10 mg by mouth daily.     pantoprazole  (PROTONIX ) 40 MG tablet TAKE ONE TABLET BY MOUTH ONCE DAILY TAKE  30-60  MINUTES  BEFORE  FIRST  MEAL  OF  THE  DAY (Patient taking differently: Take 40 mg by mouth daily before breakfast.) 30 tablet 0   predniSONE  (DELTASONE ) 10 MG tablet Take one (1) tablet daily 30 tablet 5   Respiratory Therapy Supplies (FLUTTER) DEVI Blow through 4 times per set, 3 sets per day when needed to clear lungs 1 each 0   TRELEGY ELLIPTA 100-62.5-25 MCG/ACT AEPB USE 1 INHALATION BY MOUTH DAILY 180 each 3   mexiletine (MEXITIL) 150 MG capsule Take 1 capsule (150 mg total) by mouth 3 (three) times daily. (Patient not taking: Reported on 08/26/2023) 270 capsule 3   sodium chloride  1 g tablet Take 1 tablet (1 g total) by mouth 2 (two) times daily with a  meal. (Patient not taking: Reported on 08/26/2023) 60 tablet 0   Current Facility-Administered Medications  Medication Dose Route Frequency Provider Last Rate Last Admin   albuterol  (PROVENTIL ) (2.5 MG/3ML) 0.083% nebulizer solution 2.5 mg  2.5 mg Nebulization Once Parrett, Macdonald Savoy, NP  Allergies  Allergen Reactions   Penicillins Anaphylaxis   Codeine  Rash    Review of Systems negative except from HPI and PMH  Physical Exam BP 102/60   Pulse 75   Ht 6' (1.829 m)   Wt 223 lb (101.2 kg)   SpO2 91%   BMI 30.24 kg/m  Well developed and well nourished in no acute distress HENT normal Neck supple with JVP-flat Clear Device pocket well healed; without hematoma or erythema.  There is no tethering  Regular rate and rhythm, no  gallop No murmur Abd-soft with active BS No Clubbing cyanosis tr edema Skin-warm and dry A & Oriented  Grossly normal sensory and motor function  ECG atrial pacing at 75 Intervals 30/11/38  Device function is normal. Programming changes none  See Paceart for details    Assessment and  Plan  Sinus node dysfunction   Pacemaker Medtronic   PVCs-frequent-about 2%  VT sustained   Hypertension with relatively low blood pressure  Atrial tachycardia  HFpEF   COPD  Chest pain Estimated Creatinine Clearance: 48.3 mL/min (A) (by C-G formula based on SCr of 1.5 mg/dL (H)).    Presyncope and relatively low blood pressure.  Will decrease his metoprolol  from 50--25 twice daily and discontinue his Benicar.  He will need follow-up on his BMET, and as I am going to be on medical leave I suggested he did with his PCP in about 2 weeks  Does not have evidence of volume overload; BNP was normal a week or 2 ago.  I suspect that this is pulmonary.  I have asked that they follow-up with Dr. Linder Revere.  Suspect chest pain is noncardiac.  This is important as we are using nitroglycerin  a guy who is prone to hypotension suggest that they not do this.

## 2023-08-26 NOTE — Progress Notes (Signed)
 HPI  M former smoker with COPD, chronic cough, OSA complicated by Cardiac dysrhythmia/ pacemaker, HBP  Office Spirometry 08/11/14-moderate obstruction/restriction-FVC 2.95/62%, FEV1 2.37/65%, ratio 0.79, FEF 25-75% 2.43/77% PFT 10/26/2017-normal flows without response to bronchodilator, /moderate diffusion defect. Walk Test O2 Qualifying-04/26/2018-qualified for portable oxygen-during his second lap heart rate reached 112 and saturation fell to 86% on room air Labs 04/26/2018- IgE 464, EOS wnl HST 05/17/18>> Severe obstructive sleep apnea AHI:33 CPAP to BIPAP titration 02/18/2019-  BIPAP 10/6, if not tolerated try CPAP 7  Cardiac PET 6/25, WNL low risk Walk Test on room air 08/27/23- 2 laps "wobbly" and too dyspneic for 3rd lap, but lowest O2 96%, max HR 95/min, suggesting cardiac/ deconditioning limitation, not oxygenation. -----------------------------------------------------------------------------------------   03/20/23- 78 year old male former smoker (30 pk yrs) followed for COPD, restrictive lung disease, cough, OSA, Tracheomalacia, complicated by CAD, CHF, cardiac arrhythmia/Pacemaker/ ICD, HBP, DM  -Trelegy 100, Proair  hfa, prednisone  10 mg QD, Singulair , neb Duoneb, BIPAP 10/6/ [employer] 10 S machine Download- compliance 100%, AHI 4.2/hr Body weight today 218 lbs  Had flu vax. Download reviewed.  He feels comfortable with his BiPAP and sleeps fairly well. Occasionally wakes early and gets up. Has had flu vaccine, RSV and pneumonia vaccines. No changes in his breathing and no acute events.  He is satisfied with current meds and denies need for refill.  08/27/23- 78 year old male former smoker (30 pk yrs) followed for COPD, restrictive lung disease, cough, OSA, Tracheomalacia, complicated by CAD, CHF, cardiac arrhythmia/Pacemaker/ ICD, HBP, DM  -Trelegy 100, Proair  hfa, prednisone  10 mg QD, Singulair , neb Duoneb, BIPAP 10/6/ [employer] 10 S  machine Download- compliance 80%, AHI 4.3   some short nights, but every night. Body weight today  ED 08/19/23- Hypoxemia and "hard" chest pain, relieved by NTG and/or O2. -----SOB both with exertion and while at rest.  OV with cardiologist yesterday. Arrival sat today 97%. Walk Test on room air 08/27/23- 2 laps "wobbly" and too dyspneic for 3rd lap, but lowest O2 96%, max HR 95/min, suggesting cardiac/ deconditioning limitation, not oxygenation. CXR 08/19/23 IMPRESSION: No acute cardiopulmonary process. Discussed the use of AI scribe software for clinical note transcription with the patient, who gave verbal consent to proceed.  History of Present Illness   The patient, with a history of heart disease and lung disease, presents with fluctuating shortness of breath. He reports using his wife's oxygen at home when needed, with oxygen saturation dropping at times. He describes a sensation of 'choking' and has had some reflux and heartburn. He occasionally coughs up white sputum. He also reports chest pain and discomfort in his back, under the shoulder blades. He has been using a BiPAP machine at night, which keeps him comfortable. He also reports hand tremors and coordination issues.     Assessment and Plan:    COPD mixed type Shortness of breath on exertion Intermittent dyspnea on exertion with oxygen saturation at 97% at rest, dropping at home. No acute cardiopulmonary process on recent chest x-ray. Differential includes cardiac issues, pulmonary issues, or effects of cardiac medications. BiPAP effective nocturnally. Hypotension noted at 102/60, possibly due to cardiac medications. Regular use of albuterol  rescue inhaler and Trelegy maintenance inhaler. Nebulizer provides partial relief. Considering trial of Ohtuvayre  nebulizer medication pending insurance approval; cost prohibitive without coverage. - Assess dyspnea on exertion with ambulation. - Trial Ohtuvayre  nebulizer medication pending  insurance approval.  Diabetes mellitus Insurance does not cover diabetes device  unless on insulin .    ROS-see HPI   + = positive Constitutional:    weight loss, night sweats, fevers, chills, fatigue, lassitude. HEENT:    headaches, difficulty swallowing, tooth/dental problems, sore throat,       sneezing, itching, ear ache, +nasal congestion, +post nasal drip, snoring CV:    +chest pain, orthopnea, PND, swelling in lower extremities, anasarca, dizziness, palpitations Resp:   +shortness of breath with exertion or at rest.                +productive cough,   + non-productive cough, coughing up of blood.              change in color of mucus.  +wheezing.   Skin:    rash or lesions. GI:  No-   heartburn, indigestion, abdominal pain, nausea, vomiting, GU:  MS:   joint pain, stiffness,  Neuro-     nothing unusual Psych:  change in mood or affect.  depression or anxiety.   memory loss.  OBJ- Physical Exam General- Alert, Oriented, Affect-appropriate, Distress- none acute,  Skin- rash-none, lesions- none, excoriation- none Lymphadenopathy- none Head- atraumatic            Eyes- Gross vision intact, PERRLA, conjunctivae and secretions clear            Ears- Hearing, canals-normal            Nose- Clear, no-Septal dev, mucus, polyps, erosion, perforation             Throat- Mallampati II , mucosa clear , drainage-none, tonsils- atrophic, edentulous+, not hoarse Neck-  Chest - symmetrical excursion , unlabored           Heart/CV- RRR , no murmur , no gallop  , no rub, nl s1 s2                           - JVD- none , edema- none, stasis changes- none, varices- none           Lung- +coarse, + few crackles,  cough-none, dullness-none, rub- none,            Chest wall- +L pacemaker Abd-  Br/ Gen/ Rectal- Not done, not indicated Extrem- cyanosis- none, clubbing, none, atrophy- none, strength- nl, +cane Neuro- grossly intact to observation

## 2023-08-27 ENCOUNTER — Ambulatory Visit (INDEPENDENT_AMBULATORY_CARE_PROVIDER_SITE_OTHER): Admitting: Internal Medicine

## 2023-08-27 ENCOUNTER — Encounter: Payer: Self-pay | Admitting: Internal Medicine

## 2023-08-27 VITALS — BP 128/72 | HR 87 | Temp 97.8°F | Ht 72.0 in | Wt 223.8 lb

## 2023-08-27 DIAGNOSIS — J4489 Other specified chronic obstructive pulmonary disease: Secondary | ICD-10-CM | POA: Diagnosis not present

## 2023-08-27 DIAGNOSIS — E119 Type 2 diabetes mellitus without complications: Secondary | ICD-10-CM | POA: Diagnosis not present

## 2023-08-27 NOTE — Patient Instructions (Signed)
 Order- Walk test-  room air to POC qualifying      dx dyspnea on exertion  Order- application for Ohtuvayre  nebulizer solution- 1 neb, twice daily  If it is affordable, see if it helps.

## 2023-08-28 DIAGNOSIS — Z950 Presence of cardiac pacemaker: Secondary | ICD-10-CM | POA: Diagnosis not present

## 2023-08-28 DIAGNOSIS — R2689 Other abnormalities of gait and mobility: Secondary | ICD-10-CM | POA: Diagnosis not present

## 2023-08-28 DIAGNOSIS — E7800 Pure hypercholesterolemia, unspecified: Secondary | ICD-10-CM | POA: Diagnosis not present

## 2023-08-28 DIAGNOSIS — M2550 Pain in unspecified joint: Secondary | ICD-10-CM | POA: Diagnosis not present

## 2023-08-28 DIAGNOSIS — K219 Gastro-esophageal reflux disease without esophagitis: Secondary | ICD-10-CM | POA: Diagnosis not present

## 2023-08-28 DIAGNOSIS — E1142 Type 2 diabetes mellitus with diabetic polyneuropathy: Secondary | ICD-10-CM | POA: Diagnosis not present

## 2023-08-28 DIAGNOSIS — I10 Essential (primary) hypertension: Secondary | ICD-10-CM | POA: Diagnosis not present

## 2023-08-28 DIAGNOSIS — I5032 Chronic diastolic (congestive) heart failure: Secondary | ICD-10-CM | POA: Diagnosis not present

## 2023-08-28 DIAGNOSIS — N183 Chronic kidney disease, stage 3 unspecified: Secondary | ICD-10-CM | POA: Diagnosis not present

## 2023-08-28 DIAGNOSIS — R5383 Other fatigue: Secondary | ICD-10-CM | POA: Diagnosis not present

## 2023-08-28 DIAGNOSIS — J449 Chronic obstructive pulmonary disease, unspecified: Secondary | ICD-10-CM | POA: Diagnosis not present

## 2023-09-01 ENCOUNTER — Other Ambulatory Visit: Payer: Self-pay | Admitting: Family Medicine

## 2023-09-01 ENCOUNTER — Telehealth: Payer: Self-pay

## 2023-09-01 DIAGNOSIS — R2689 Other abnormalities of gait and mobility: Secondary | ICD-10-CM

## 2023-09-01 NOTE — Telephone Encounter (Signed)
 Received Ohtuvayre new start paperwork. Completed form and faxed with clinicals and insurance card copy to [facility]   Phone#: [phone] Fax#: [phone]

## 2023-09-02 ENCOUNTER — Ambulatory Visit: Admitting: Neurology

## 2023-09-04 DIAGNOSIS — I25118 Atherosclerotic heart disease of native coronary artery with other forms of angina pectoris: Secondary | ICD-10-CM | POA: Diagnosis not present

## 2023-09-04 DIAGNOSIS — I739 Peripheral vascular disease, unspecified: Secondary | ICD-10-CM | POA: Diagnosis not present

## 2023-09-04 DIAGNOSIS — J449 Chronic obstructive pulmonary disease, unspecified: Secondary | ICD-10-CM | POA: Diagnosis not present

## 2023-09-04 DIAGNOSIS — E7800 Pure hypercholesterolemia, unspecified: Secondary | ICD-10-CM | POA: Diagnosis not present

## 2023-09-04 DIAGNOSIS — I5032 Chronic diastolic (congestive) heart failure: Secondary | ICD-10-CM | POA: Diagnosis not present

## 2023-09-04 DIAGNOSIS — N183 Chronic kidney disease, stage 3 unspecified: Secondary | ICD-10-CM | POA: Diagnosis not present

## 2023-09-04 DIAGNOSIS — Z950 Presence of cardiac pacemaker: Secondary | ICD-10-CM | POA: Diagnosis not present

## 2023-09-04 DIAGNOSIS — K219 Gastro-esophageal reflux disease without esophagitis: Secondary | ICD-10-CM | POA: Diagnosis not present

## 2023-09-04 DIAGNOSIS — E1142 Type 2 diabetes mellitus with diabetic polyneuropathy: Secondary | ICD-10-CM | POA: Diagnosis not present

## 2023-09-04 DIAGNOSIS — I10 Essential (primary) hypertension: Secondary | ICD-10-CM | POA: Diagnosis not present

## 2023-09-06 LAB — CUP PACEART INCLINIC DEVICE CHECK
Battery Impedance: 1201 Ohm
Battery Remaining Longevity: 53 mo
Battery Voltage: 2.76 V
Brady Statistic AP VP Percent: 1 %
Brady Statistic AP VS Percent: 99 %
Brady Statistic AS VP Percent: 0 %
Brady Statistic AS VS Percent: 0 %
Date Time Interrogation Session: 20250423120800
Implantable Lead Connection Status: 753985
Implantable Lead Connection Status: 753985
Implantable Lead Implant Date: 20070820
Implantable Lead Implant Date: 20070820
Implantable Lead Location: 753859
Implantable Lead Location: 753860
Implantable Lead Model: 4092
Implantable Lead Model: 5076
Implantable Pulse Generator Implant Date: 20170222
Lead Channel Impedance Value: 488 Ohm
Lead Channel Impedance Value: 621 Ohm
Lead Channel Pacing Threshold Amplitude: 0.875 V
Lead Channel Pacing Threshold Amplitude: 1 V
Lead Channel Pacing Threshold Amplitude: 1.375 V
Lead Channel Pacing Threshold Amplitude: 1.5 V
Lead Channel Pacing Threshold Pulse Width: 0.4 ms
Lead Channel Pacing Threshold Pulse Width: 0.4 ms
Lead Channel Pacing Threshold Pulse Width: 0.4 ms
Lead Channel Pacing Threshold Pulse Width: 0.4 ms
Lead Channel Sensing Intrinsic Amplitude: 2.8 mV
Lead Channel Setting Pacing Amplitude: 2 V
Lead Channel Setting Pacing Amplitude: 2.5 V
Lead Channel Setting Pacing Pulse Width: 0.4 ms
Lead Channel Setting Sensing Sensitivity: 1 mV
Zone Setting Status: 755011
Zone Setting Status: 755011

## 2023-09-10 ENCOUNTER — Ambulatory Visit
Admission: RE | Admit: 2023-09-10 | Discharge: 2023-09-10 | Disposition: A | Discharge status: Home / Self Care | Source: Ambulatory Visit | Attending: Family Medicine | Admitting: Family Medicine

## 2023-09-10 DIAGNOSIS — H538 Other visual disturbances: Secondary | ICD-10-CM | POA: Diagnosis not present

## 2023-09-10 DIAGNOSIS — R519 Headache, unspecified: Secondary | ICD-10-CM | POA: Diagnosis not present

## 2023-09-10 DIAGNOSIS — R2689 Other abnormalities of gait and mobility: Secondary | ICD-10-CM | POA: Diagnosis not present

## 2023-09-13 ENCOUNTER — Encounter: Payer: Self-pay | Admitting: Internal Medicine

## 2023-09-18 DIAGNOSIS — H43813 Vitreous degeneration, bilateral: Secondary | ICD-10-CM | POA: Diagnosis not present

## 2023-09-18 DIAGNOSIS — H04123 Dry eye syndrome of bilateral lacrimal glands: Secondary | ICD-10-CM | POA: Diagnosis not present

## 2023-09-18 DIAGNOSIS — H524 Presbyopia: Secondary | ICD-10-CM | POA: Diagnosis not present

## 2023-09-18 DIAGNOSIS — H04223 Epiphora due to insufficient drainage, bilateral lacrimal glands: Secondary | ICD-10-CM | POA: Diagnosis not present

## 2023-09-18 DIAGNOSIS — E119 Type 2 diabetes mellitus without complications: Secondary | ICD-10-CM | POA: Diagnosis not present

## 2023-10-02 ENCOUNTER — Ambulatory Visit: Payer: 59 | Admitting: Internal Medicine

## 2023-10-07 ENCOUNTER — Encounter: Payer: Self-pay | Admitting: Neurology

## 2023-10-07 ENCOUNTER — Ambulatory Visit (INDEPENDENT_AMBULATORY_CARE_PROVIDER_SITE_OTHER): Admitting: Neurology

## 2023-10-07 VITALS — BP 120/73 | HR 64 | Ht 72.0 in | Wt 226.0 lb

## 2023-10-07 DIAGNOSIS — R2689 Other abnormalities of gait and mobility: Secondary | ICD-10-CM | POA: Diagnosis not present

## 2023-10-07 DIAGNOSIS — R269 Unspecified abnormalities of gait and mobility: Secondary | ICD-10-CM | POA: Diagnosis not present

## 2023-10-07 DIAGNOSIS — R251 Tremor, unspecified: Secondary | ICD-10-CM | POA: Diagnosis not present

## 2023-10-07 DIAGNOSIS — Z9181 History of falling: Secondary | ICD-10-CM | POA: Diagnosis not present

## 2023-10-07 NOTE — Patient Instructions (Addendum)
 You have a rather mild tremor of both hands, likely due to medication effect from your prednisone  and COPD medications.  I do not see any signs or symptoms of parkinson's like disease or what we call parkinsonism.  For your tremor, I would not recommend any new medications at this time.  As far as your balance issues, they are likely secondary to multiple issues including arthritis, taking gabapentin  which can affect your balance adversely, and your neuropathy from your diabetes.  Optimizing diabetes control is going to be key.  For fall prevention and gait safety I recommend that you start using a cane or even a walker consistently.   With your next blood test with your PCP I recommend that you get your diabetes marker checked, your thyroid  function, your kidney function checked again and your vitamin B12 level. Please remember, that any kind of tremor may be exacerbated by anxiety, anger, nervousness, excitement, dehydration, sleep deprivation, thyroid  dysfunction, by caffeine, and low blood sugar values or blood sugar fluctuations. Some medications can exacerbate tremors, this includes certain asthma or COPD medications and certain antidepressants.

## 2023-10-07 NOTE — Progress Notes (Signed)
 Subjective:    Patient ID: [name] [name] is a 78 y.o. male.  HPI    [name], MD, PhD [facility] [address], [address] [address] [place], [place] [address]  Dear Dr. Raquel Cables,  I saw your patient, [name] [name], upon your kind request in my neurologic clinic today for initial consultation of his balance issues.  The patient is accompanied by his daughter today. As you know, [name] is an 78 year old male with an underlying complex medical history of chronic back pain, arthritis, chronic kidney disease, COPD, OSA on BiPAP therapy, depression, bradycardia, diabetes, reflux disease, hypertension, hyperlipidemia, hypertensive retinopathy, peripheral neuropathy, sinus node dysfunction, ventricular tachycardia, PVCs, atrial tachycardia, status post pacemaker placement, status post multiple surgeries including right total hip arthroplasty, right ulnar nerve transposition, back surgery, right carpal tunnel release, cataract surgeries, pacemaker placement, multiple tooth extractions, and mild obesity, who reports feeling off balance and sometimes lightheaded.  He feels that he staggers, sometimes he wants to go 1 direction but his feet go in the other direction.  He has not fallen thankfully.  He has not had any loss of consciousness.  He has not had any convulsions.  Family has noticed a hand tremor intermittently for the past year.  Symptoms have been ongoing for about a year.  He uses a walking cane when he walks.  He does use an inhaler daily and prednisone  daily and nebulizer as needed.  He has right hip pain.  He had a right hip replacement a few years ago.  He tries to hydrate well, estimates that he drinks about 4 bottles of water per day.  He drinks noncaffeine containing coffee, 2 to 3 cups/day.  He has a remote history of smoking, quit in 1972.  He does not use dentures.  He is consistent with his BiPAP treatment.  He has no family history of Parkinson's disease and  1 brother may have hand tremor, another brother that lives with him has dementia.  His granddaughter lives on the property as well.  His dad lived to be in his 14s and mom lived to be in her 27s and died from heart disease.  I reviewed your office note from 08/28/2023.  He reported oxygen drops, shortness of breath, shakiness, and staggering while walking.  He was complaining of fatigue.  He had blood work through your office at the time including thyroid  cascade, CBC with differential and CMP.  Blood test results are not available for my review today.  Of note, he presented to the emergency room on 08/19/2023 with low oxygen saturations and chest pain in the morning, he did not stay beyond triage.  He was admitted about a year ago to the hospital from 10/08/2022 through 10/10/2022 with COPD exacerbation and acute metabolic encephalopathy.  His latest A1c was elevated at 8.4.  He has been on gabapentin , 300 mg twice daily.  He takes prednisone  10 mg daily.  He is followed by multiple specialists including cardiology, pulmonology, and retina specialist. He had a head CT without contrast on 09/10/2023 and I reviewed the results:  IMPRESSION: No acute CT finding. Age related volume loss. Chronic small-vessel ischemic changes of the white matter.     His CMP from 08/19/2023 was abnormal with blood sugar level of 173, potassium below normal at 3.4, BUN elevated at 25, creatinine elevated at 1.5, AST elevated at 46, below normal GFR at 47. His Past Medical History Is Significant For: Past Medical History:  Diagnosis Date  Arthritis    Bradycardia    pacemaker - [employer]onic Adapta #[id number] - December 22, 2005   Carpal tunnel syndrome on both sides    Carpal tunnel syndrome, bilateral    Chronic back pain    Chronic kidney disease    COPD (chronic obstructive pulmonary disease) (HCC)    Depression    Diabetes mellitus    type II controled with diet   GERD (gastroesophageal reflux disease)    Headache(784.0)     Hyperlipidemia    Hypertension    Hypertensive retinopathy    Insomnia    Neuromuscular disorder (HCC)    peripheral neuropathy BLE   Pneumonia    Presence of permanent cardiac pacemaker    Wears glasses     His Past Surgical History Is Significant For: Past Surgical History:  Procedure Laterality Date   BACK SURGERY     CARPAL TUNNEL RELEASE Right 03/24/2017   Procedure: Right CARPAL TUNNEL RELEASE;  Surgeon: Manya Sells, MD;  Location: [facility];  Service: Neurosurgery;  Laterality: Right;  Right CARPAL TUNNEL RELEASE   CATARACT EXTRACTION     EP IMPLANTABLE DEVICE N/A 06/27/2015   Procedure:  PPM Generator Changeout;  Surgeon: Verona Goodwill, MD;  Location: [facility] INVASIVE CV LAB;  Service: Cardiovascular;  Laterality: N/A;   EYE SURGERY     HEMORRHOID SURGERY     INSERT / REPLACE / REMOVE PACEMAKER     MULTIPLE TOOTH EXTRACTIONS     PACEMAKER INSERTION  ?2007   TOTAL HIP ARTHROPLASTY Right 01/25/2013   Procedure: TOTAL HIP ARTHROPLASTY ANTERIOR APPROACH;  Surgeon: Alphonzo Ask, MD;  Location: [facility] OR;  Service: Orthopedics;  Laterality: Right;   ULNAR NERVE TRANSPOSITION Right 03/24/2017   Procedure: Right Ulnar nerve release;  Surgeon: Manya Sells, MD;  Location: [facility] OR;  Service: Neurosurgery;  Laterality: Right;  Right Ulnar nerve release    His Family History Is Significant For: Family History  Problem Relation Age of Onset   Heart disease Mother    Asthma Maternal Aunt    Allergies Other        whole family   Parkinson's disease Neg Hx     His Social History Is Significant For: Social History   Socioeconomic History   Marital status: Legally Separated    Spouse name: Not on file   Number of children: 2   Years of education: `   Highest education level: Not on file  Occupational History   Occupation: Retired  Tobacco Use   Smoking status: Former    Current packs/day: 0.00    Average packs/day: 2.0 packs/day for 15.0 years (30.0 ttl pk-yrs)    Types:  Cigarettes    Start date: 05/05/1958    Quit date: 05/05/1973    Years since quitting: 50.4   Smokeless tobacco: Never  Vaping Use   Vaping status: Never Used  Substance and Sexual Activity   Alcohol use: No    Alcohol/week: 0.0 standard drinks of alcohol   Drug use: No   Sexual activity: Not on file  Other Topics Concern   Not on file  Social History Narrative   Pt lives with wife    Retired    [employer] Strain: Not on file  Food Insecurity: No Food Insecurity (10/08/2022)   Hunger Vital Sign    Worried About Running Out of Food in the Last Year: Never true    Ran Out of Food in the Last Year:  Never true  Transportation Needs: No Transportation Needs (10/08/2022)   PRAPARE - Administrator, Civil Service (Medical): No    Lack of Transportation (Non-Medical): No  Physical Activity: Not on file  Stress: Not on file  Social Connections: Not on file    His Allergies Are:  Allergies  Allergen Reactions   Penicillins Anaphylaxis   Codeine  Rash  :   His Current Medications Are:  Outpatient Encounter Medications as of 10/07/2023  Medication Sig   albuterol  (VENTOLIN  HFA) 108 (90 Base) MCG/ACT inhaler Inhale 2 puffs into the lungs every 6 (six) hours as needed for wheezing or shortness of breath.   aspirin  81 MG tablet Take 81 mg by mouth daily.   atorvastatin  (LIPITOR ) 80 MG tablet TAKE 1 TABLET BY MOUTH ONCE  DAILY   CALCIUM -MAGNESIUM -ZINC  PO Take 1 tablet by mouth daily.   diltiazem  (CARDIZEM  SR) 120 MG 12 hr capsule Take 1 capsule by mouth once daily   docusate sodium  (COLACE) 100 MG capsule Take 100 mg by mouth daily.   ezetimibe  (ZETIA ) 10 MG tablet Take 1 tablet by mouth once daily   famotidine  (PEPCID ) 20 MG tablet TAKE ONE TABLET BY MOUTH ONCE DAILY AT BEDTIME (Patient taking differently: Take 20 mg by mouth at bedtime.)   furosemide  (LASIX ) 40 MG tablet Take 1 tablet by mouth once daily   gabapentin  (NEURONTIN ) 300 MG capsule  Take 300 mg by mouth 2 (two) times daily.   glimepiride  (AMARYL ) 1 MG tablet Take 1 mg by mouth daily with breakfast.   Guaifenesin  (MUCINEX  MAXIMUM STRENGTH) 1200 MG TB12 Take 1,200 mg by mouth 2 (two) times daily.   ipratropium-albuterol  (DUONEB) 0.5-2.5 (3) MG/3ML SOLN Inhale 3 mLs into the lungs every 6 (six) hours as needed (wheezing or shortness of breath).   isosorbide  mononitrate (IMDUR ) 60 MG 24 hr tablet Take 1 tablet by mouth once daily   JARDIANCE  10 MG TABS tablet TAKE 1 TABLET BY MOUTH ONCE DAILY BEFORE BREAKFAST   levocetirizine (XYZAL ) 5 MG tablet Take 5 mg at bedtime by mouth.    metoprolol  tartrate (LOPRESSOR ) 25 MG tablet Take 1 tablet (25 mg total) by mouth 2 (two) times daily.   montelukast  (SINGULAIR ) 10 MG tablet Take 10 mg by mouth daily.   Multiple Vitamin (MULTIVITAMIN WITH MINERALS) TABS tablet Take 1 tablet by mouth every other day.   nitroGLYCERIN  (NITROSTAT ) 0.4 MG SL tablet Place 1 tablet (0.4 mg total) under the tongue every 5 (five) minutes as needed for chest pain. DO NOT EXCEED A TOTAL OF 3 DOSES IN 15 MINUTES   pantoprazole  (PROTONIX ) 40 MG tablet TAKE ONE TABLET BY MOUTH ONCE DAILY TAKE  30-60  MINUTES  BEFORE  FIRST  MEAL  OF  THE  DAY (Patient taking differently: Take 40 mg by mouth daily before breakfast.)   predniSONE  (DELTASONE ) 10 MG tablet Take one (1) tablet daily   Respiratory Therapy Supplies (FLUTTER) DEVI Blow through 4 times per set, 3 sets per day when needed to clear lungs   TRELEGY ELLIPTA 100-62.5-25 MCG/ACT AEPB USE 1 INHALATION BY MOUTH DAILY   Facility-Administered Encounter Medications as of 10/07/2023  Medication   albuterol  (PROVENTIL ) (2.5 MG/3ML) 0.083% nebulizer solution 2.5 mg  :   Review of Systems:  Out of a complete 14 point review of systems, all are reviewed and negative with the exception of these symptoms as listed below:   Review of Systems  Neurological:        Pt  here for balance issues Pt has tremors in both hands Pt  said coordination  has declined Daughter states wants patient tested for parkinson     Objective:  Neurological Exam  Physical Exam Physical Examination:   Vitals:   10/07/23 0757  BP: 120/73  Pulse: 64    General Examination: The patient is a very pleasant 78 y.o. male in no acute distress. He appears well-developed and well-nourished and well groomed.   HEENT: Normocephalic, atraumatic, pupils are equal, round and reactive to light, extraocular tracking is good without limitation to gaze excursion or nystagmus noted. Hearing is grossly intact to mildly impaired. Face is symmetric with normal facial animation and normal facial sensation to light touch, temperature and vibration sense. Speech is clear with no dysarthria noted. There is no hypophonia. There is no lip, neck/head, jaw or voice tremor. Neck is supple with full range of passive and active motion. There are no carotid bruits on auscultation. Oropharynx exam reveals: moderate mouth dryness, edentulous. Tongue protrudes centrally and palate elevates symmetrically.   Chest: Clear to auscultation without wheezing, rhonchi or crackles noted.  Heart: S1+S2+0, regular and normal without murmurs, rubs or gallops noted.   Abdomen: Soft, non-tender and non-distended.  Extremities: There is trace pitting edema in the distal lower extremities bilaterally.   Skin: Warm and dry without trophic changes noted.   Musculoskeletal: exam reveals no obvious joint deformities.  Reports right hip pain and has decreased range of motion in both legs.  Neurologically:  Mental status: The patient is awake, alert and oriented in all 4 spheres. His immediate and remote memory, attention, language skills and fund of knowledge are fairly appropriate, he does not provide much in the way of details of his history.  History is supplemented by his daughter.  Mood is normal and affect is normal.  Cranial nerves II - XII are as described above under HEENT  exam.  Motor exam: Normal bulk, strength and tone is noted. There is no obvious action or resting tremor.  He has a slight right upper extremity and rather mild left upper extremity postural tremor.  He has no lower extremity tremor. On 10/07/2023: On Archimedes spiral drawing he has slight tremor with the right hand, insecurity and mild tremor with the left hand, handwriting is legible, not tremulous, not micrographic.  Fine motor skills and coordination: grossly intact.  Cerebellar testing: No dysmetria or intention tremor. There is no truncal or gait ataxia.  Normal finger-to-nose, has difficulty with heel-to-shin due to difficulty with range of motion. Reflexes are 1+ in the upper extremities and diminished in the lower extremities. Sensory exam: intact to light touch in the upper and lower extremities but decreased sensation was noted to vibration sense in both distal lower extremities.  Gait, station and balance: He stands with mild difficulty and pushes himself up.  He stands slightly wide-based.  He walks without a walking aid.  He has mild trouble turning, no shuffling noted, has preserved arm swing.  Romberg is not tested for safety concern, tandem walk is not tested for safety concern.  Assessment and Plan:  In summary, HARIM BI is an 78 year old male with an underlying complex medical history of chronic back pain, arthritis, chronic kidney disease, COPD, OSA on BiPAP therapy, depression, bradycardia, diabetes, reflux disease, hypertension, hyperlipidemia, hypertensive retinopathy, peripheral neuropathy, sinus node dysfunction, ventricular tachycardia, PVCs, atrial tachycardia, status post pacemaker placement, status post multiple surgeries including right total hip arthroplasty, right ulnar nerve transposition, back surgery, right  carpal tunnel release, cataract surgeries, pacemaker placement, multiple tooth extractions, and mild obesity, who presents for evaluation of his balance issues  and intermittent tremors.  History and examination are not in keeping with parkinsonism.  He is largely reassured in this regard.  He does have a rather mild upper extremity postural tremor, left more noticeable compared to right.  He has no resting tremor or intention tremor.  Tremor could be secondary to his prednisone  and his COPD medications.  He is encouraged to limit his decaf coffee to 1 or 2 servings per day and stay well-hydrated and well rested, continue with his BiPAP therapy consistently.  For fall prevention and gait safety I recommend that he start using a cane consistently at all times or even a walker.  We talked about the importance of fall prevention today.  He is advised that balance issues are likely secondary to multiple etiologies including diabetic neuropathy, taking gabapentin  which can adversely affect the balance and from arthritis.  Optimizing diabetes control is also going to be important.  He is advised to follow-up with you regularly and his other providers at this time, I do not recommend any additional testing from my end of things at this time.  I would be happy to see him back as needed.  With his next blood work, please do check his thyroid  function, diabetes control, kidney function, and vitamin B12 level and potassium level.  I answered all their questions today and the patient and his daughter were in agreement. Thank you very much for allowing me to participate in the care of this nice patient. If I can be of any further assistance to you please do not hesitate to call me at [phone].  Sincerely,   [name], MD, PhD

## 2023-10-13 ENCOUNTER — Other Ambulatory Visit: Payer: Self-pay | Admitting: Internal Medicine

## 2023-10-14 ENCOUNTER — Other Ambulatory Visit: Payer: Self-pay | Admitting: Internal Medicine

## 2023-10-22 ENCOUNTER — Other Ambulatory Visit: Payer: Self-pay | Admitting: Internal Medicine

## 2023-10-23 NOTE — Telephone Encounter (Signed)
 Pt of Dr. Paulita Boss. This RX seems to be D/C'd on 07/27/23. Please advise on this.

## 2023-11-16 NOTE — Progress Notes (Signed)
 Cardiology Office Note:  .   Date:  11/30/2023  ID:  MESSI TWEDT, DOB Jul 09, 1945, MRN [id number] PCP: Arloa Elsie SAUNDERS, MD  [facility] Providers Cardiologist:  Stanly DELENA Leavens, MD Electrophysiologist:  Elspeth Sage, MD    History of Present Illness: [name]   ALIN [name] is a 78 y.o. male  with atrial tachycardia, PACs, PVCs, and sinus node dysfunction. He has a history of heart failure with preserved ejection fraction (HEPF) and chronic obstructive pulmonary disease (COPD).  Patient saw Dr. Leavens 07/29/23 with near syncope. Device interrogation notes significant NSVT and started on mexiletine.    Patient saw Dr. Sage 08/26/23 with presyncope and low BP. Metoprolol  decreased from 50 mg to 25 mg bid, stopped benicar. Chest pain felt to be noncardiac and asked him not to use NTG with hypotension.  Patient comes in with his daughter. BP went high but he may have missed a dose. She started him back on benicar 20 mg 1/2 tablet. Uses lasix  a couple days a week. Tries to watch salt but then will eat a can of sausages and eats out a lot. Denies swelling today, no chest pain, palpitations. Still push mows, does weed eating, walks some. Doesn't think he is taking mexilitine. Not on med list.  ROS:    Studies Reviewed: [name]         Prior CV Studies:    NM PET CT CARDIAC PERFUSION MULTI W/ABSOLUTE BLOODFLOW 10/28/2022   Narrative   LV perfusion is normal. There is no evidence of ischemia. There is no evidence of infarction.   Rest left ventricular function is normal. Rest EF: 52 %. Stress left ventricular function is normal. Stress EF: 53 %. End diastolic cavity size is normal. End systolic cavity size is normal.   Myocardial blood flow was computed to be 0.40ml/g/min at rest and 2.05ml/g/min at stress. Global myocardial blood flow reserve was 2.70 and was normal.   Coronary calcium  was present on the attenuation correction CT images. Mild coronary calcifications were  present. Coronary calcifications were present in the left anterior descending artery and left circumflex artery distribution(s).   The study is normal. The study is low risk.   Pacing wires in RA/RV   CLINICAL DATA:  This over-read does not include interpretation of cardiac or coronary anatomy or pathology. The cardiac PET-CT interpretation by the cardiologist is attached.   COMPARISON:  None Available.   FINDINGS: No suspicious nodules, masses, or infiltrates are identified in the visualized portion of the lungs. No pleural fluid seen.   The visualized portions of the mediastinum and chest wall are unremarkable.   IMPRESSION: No significant non-cardiac abnormality identified.     Electronically Signed By: Norleen DELENA Kil M.D. On: 10/28/2022 09:48   ECHOCARDIOGRAM   ECHOCARDIOGRAM COMPLETE 06/08/2023   Narrative ECHOCARDIOGRAM REPORT       Patient Name:   [name] [name] Date of Exam: 06/08/2023 Medical Rec #:  [id number]       Height:       72.0 in Accession #:    [id number]      Weight:       218.0 lb Date of Birth:  08-Aug-1945       BSA:          2.210 m Patient Age:    77 years        BP:           115/76 mmHg Patient Gender: M  HR:           81 bpm. Exam Location:  [address]   Procedure: 2D Echo, Cardiac Doppler, Color Doppler and Intracardiac Opacification Agent   Indications:    I49.3 PVC   History:        Patient has prior history of Echocardiogram examinations, most recent 02/01/2021. Pacemaker, COPD; Risk Factors:Hypertension, Diabetes and Dyslipidemia. Bradycardia, Chronic kidney disease. Atrial tachycardia.   Sonographer:    Jon Hacker RCS Referring Phys: [phone] ELSPETH JAYSON SAGE   IMPRESSIONS     1. Left ventricular ejection fraction, by estimation, is 50 to 55%. The left ventricle has low normal function. The left ventricle demonstrates regional wall motion abnormalities (see scoring diagram/findings for description). There is mild  left ventricular hypertrophy. Left ventricular diastolic parameters are consistent with Grade I diastolic dysfunction (impaired relaxation). 2. Right ventricular systolic function is low normal. The right ventricular size is normal. Tricuspid regurgitation signal is inadequate for assessing PA pressure. 3. The mitral valve is normal in structure. Trivial mitral valve regurgitation. No evidence of mitral stenosis. 4. The aortic valve has an indeterminant number of cusps. Aortic valve regurgitation is not visualized. Aortic valve sclerosis is present, with no evidence of aortic valve stenosis.   Comparison(s): A prior study was performed on 02/01/2021. Prior images reviewed side by side. LVEF remains stable at 50-55%, RWMA were not reported on prior studies but they were present, see prior report for details.   FINDINGS Left Ventricle: Left ventricular ejection fraction, by estimation, is 50 to 55%. The left ventricle has low normal function. The left ventricle demonstrates regional wall motion abnormalities. The left ventricular internal cavity size was normal in size. There is mild left ventricular hypertrophy. Left ventricular diastolic parameters are consistent with Grade I diastolic dysfunction (impaired relaxation).     LV Wall Scoring: The mid and distal anterior septum, mid inferoseptal segment, and apical inferior segment are hypokinetic.   Right Ventricle: The right ventricular size is normal. Right vetricular wall thickness was not well visualized. Right ventricular systolic function is low normal. Tricuspid regurgitation signal is inadequate for assessing PA pressure.   Left Atrium: Left atrial size was normal in size.   Right Atrium: Right atrial size was normal in size.   Pericardium: There is no evidence of pericardial effusion.   Mitral Valve: The mitral valve is normal in structure. Trivial mitral valve regurgitation. No evidence of mitral valve stenosis.   Tricuspid Valve:  The tricuspid valve is normal in structure. Tricuspid valve regurgitation is trivial. No evidence of tricuspid stenosis.   Aortic Valve: The aortic valve has an indeterminant number of cusps. Aortic valve regurgitation is not visualized. Aortic valve sclerosis is present, with no evidence of aortic valve stenosis.   Pulmonic Valve: The pulmonic valve was not well visualized. Pulmonic valve regurgitation is trivial. No evidence of pulmonic stenosis.   Aorta: The aortic root and ascending aorta are structurally normal, with no evidence of dilitation.   IAS/Shunts: No atrial level shunt detected by color flow Doppler.   Additional Comments: A device lead is visualized.     LEFT VENTRICLE PLAX 2D LVIDd:         5.10 cm   Diastology LVIDs:         3.70 cm   LV e' medial:    3.73 cm/s LV PW:         1.10 cm   LV E/e' medial:  13.8 LV IVS:  1.20 cm   LV e' lateral:   5.92 cm/s LVOT diam:     2.10 cm   LV E/e' lateral: 8.7 LV SV:         47 LV SV Index:   21 LVOT Area:     3.46 cm     RIGHT VENTRICLE RV S prime:     10.20 cm/s TAPSE (M-mode): 2.3 cm   LEFT ATRIUM             Index        RIGHT ATRIUM          Index LA diam:        3.80 cm 1.72 cm/m   RA Area:     7.69 cm LA Vol (A2C):   37.4 ml 16.92 ml/m  RA Volume:   14.00 ml 6.33 ml/m LA Vol (A4C):   50.2 ml 22.71 ml/m LA Biplane Vol: 46.3 ml 20.95 ml/m AORTIC VALVE LVOT Vmax:   73.40 cm/s LVOT Vmean:  48.100 cm/s LVOT VTI:    0.137 m   AORTA Ao Root diam: 3.50 cm Ao Asc diam:  3.70 cm   MITRAL VALVE MV Area (PHT): 2.24 cm    SHUNTS MV Decel Time: 338 msec    Systemic VTI:  0.14 m MV E velocity: 51.40 cm/s  Systemic Diam: 2.10 cm MV A velocity: 62.40 cm/s MV E/A ratio:  0.82   Sunit Tolia Electronically signed by Madonna Large Signature Date/Time: 06/08/2023/2:03:37 PM       Final       CT SCANS   CT CORONARY MORPH W/CTA COR W/SCORE 03/01/2019   Addendum 03/01/2019  7:16 PM ADDENDUM REPORT:  03/01/2019 19:14   CLINICAL DATA:  41M with sick sinus syndrome, prior tobacco abuse, COPD, hypertension, diabetes, CKD and chest pain.   EXAM: Cardiac/Coronary  CT   TECHNIQUE: The patient was scanned on a [employer].   FINDINGS: A 120 kV prospective scan was triggered in the descending thoracic aorta at 111 HU's. Axial non-contrast 3 mm slices were carried out through the heart. The data set was analyzed on a dedicated work station and scored using the Agatson method. Gantry rotation speed was 250 msecs and collimation was .6 mm. No beta blockade and 0.8 mg of sl NTG was given. The 3D data set was reconstructed in 5% intervals of the 67-82 % of the R-R cycle. Diastolic phases were analyzed on a dedicated work station using MPR, MIP and VRT modes. The patient received 80 cc of contrast.   Aorta: Normal size. Ascending aorta 3.3 cm. Mild calcification of the descending aorta. No dissection.   Aortic Valve:  Trileaflet.  No calcifications.   Coronary Arteries:  Normal coronary origin.  Right dominance.   RCA is a small, non-dominant artery.  There is no plaque.   Left main is a large artery that gives rise to LAD and LCX arteries. Minimal (<25%) calcified plaque.   LAD is a large vessel. There is minimal (<25%) mixed plaque in the ostium and mild (25-49%) mixed plaque proximally. There is a short segment of myocardial bridging distally. There is a large, branching D1 with minimal calcified plaque proximally and mild calcified plaque at the bifurcation. D2 and D3 are small and without significant stenosis.   LCX is a large, dominant artery that gives rise to one large OM1 branch. There is scattered minimal calcified plaque. There is a small OM1, medium OM2, large OM3 and small OM4 without evidence of  significant disease.   Other findings:   Normal pulmonary vein drainage into the left atrium.   Normal let atrial appendage without a thrombus.   Normal size  of the pulmonary artery.   Dual chamber pacemaker noted in the right atrium and right ventricle.   IMPRESSION: 1. Coronary calcium  score of 117. This was 36th percentile for age and sex matched control.   2. Normal coronary origin with left dominance.   3. No non-obstructive CAD.   Annabella Scarce, MD     Electronically Signed By: Annabella Scarce On: 03/01/2019 19:14   Narrative EXAM: OVER-READ INTERPRETATION  CT CHEST   The following report is an over-read performed by radiologist Dr. Franky Crease of [facility] on 03/01/2019. This over-read does not include interpretation of cardiac or coronary anatomy or pathology. The coronary CTA interpretation by the cardiologist is attached.   COMPARISON:  None.   FINDINGS: Vascular: Heart is normal size.  Visualized aorta normal caliber.   Mediastinum/Nodes: No adenopathy in the lower mediastinum or hila.   Lungs/Pleura: No confluent opacities or effusions.   Upper Abdomen: Imaging into the upper abdomen shows no acute findings.   Musculoskeletal: Chest wall soft tissues are unremarkable. No acute bony abnormality.   IMPRESSION: No acute or significant extracardiac abnormality.   Electronically Signed: By: Franky Crease M.D. On: 03/01/2019 15:01    Risk Assessment/Calculations:             Physical Exam:   VS:  BP 124/84   Pulse 63   Ht 6' (1.829 m)   Wt 223 lb 3.2 oz (101.2 kg)   SpO2 93%   BMI 30.27 kg/m    Orhtostatics: No data found. Wt Readings from Last 3 Encounters:  11/30/23 223 lb 3.2 oz (101.2 kg)  11/26/23 224 lb 6.4 oz (101.8 kg)  10/07/23 226 lb (102.5 kg)    GEN: Well nourished, well developed in no acute distress NECK: No JVD; No carotid bruits CARDIAC:  RRR, no murmurs, rubs, gallops RESPIRATORY:  Clear to auscultation without rales, wheezing or rhonchi  ABDOMEN: Soft, non-tender, non-distended EXTREMITIES:  No edema; No deformity   ASSESSMENT AND PLAN: .    Near  syncope with NSVT started on mexilitine. Not on current med list and family doesn't know if he's taking. They will check when they get home and let us  know.  HTN/hypotenstion-BP was running low and metoprolol  decreased to 12.5 mg bid, benicar stopped but patient restarted benicar 1/2 dose.     Atrial tachycardia and ventricular tachycardia Atrial and sustained ventricular tachycardia managed with a Medtronic pacemaker.     CAD -no recent chest pain       Heart failure with preserved ejection fraction (HFpEF) HFpEF-takes lasix  several days a week. -check labs   Chronic obstructive pulmonary disease (COPD) COPD with good breathing, indicating well-managed status. - Continue current management and monitor for COPD exacerbation symptoms.  HLD-check FLP today.            Dispo: f/u Dr. JAYSON & EPS  Signed, Olivia Pavy, PA-C

## 2023-11-25 NOTE — Progress Notes (Unsigned)
 HPI  M former smoker with COPD, chronic cough, OSA complicated by Cardiac dysrhythmia/ pacemaker, HBP  Office Spirometry 08/11/14-moderate obstruction/restriction-FVC 2.95/62%, FEV1 2.37/65%, ratio 0.79, FEF 25-75% 2.43/77% PFT 10/26/2017-normal flows without response to bronchodilator, /moderate diffusion defect. Walk Test O2 Qualifying-04/26/2018-qualified for portable oxygen-during his second lap heart rate reached 112 and saturation fell to 86% on room air Labs 04/26/2018- IgE 464, EOS wnl HST 05/17/18>> Severe obstructive sleep apnea AHI:33 CPAP to BIPAP titration 02/18/2019-  BIPAP 10/6, if not tolerated try CPAP 7  Cardiac PET 6/25, WNL low risk Walk Test on room air 08/27/23- 2 laps wobbly and too dyspneic for 3rd lap, but lowest O2 96%, max HR 95/min, suggesting cardiac/ deconditioning limitation, not oxygenation. -----------------------------------------------------------------------------------------    08/27/23- 78 year old male former smoker (30 pk yrs) followed for COPD, restrictive lung disease, cough, OSA, Tracheomalacia, complicated by CAD, CHF, cardiac arrhythmia/Pacemaker/ ICD, HBP, DM  -Trelegy 100, Proair  hfa, prednisone  10 mg QD, Singulair , neb Duoneb, BIPAP 10/6/ [employer] 10 S machine Download- compliance 80%, AHI 4.3   some short nights, but every night. Body weight today  ED 08/19/23- Hypoxemia and hard chest pain, relieved by NTG and/or O2. -----SOB both with exertion and while at rest.  OV with cardiologist yesterday. Arrival sat today 97%. Walk Test on room air 08/27/23- 2 laps wobbly and too dyspneic for 3rd lap, but lowest O2 96%, max HR 95/min, suggesting cardiac/ deconditioning limitation, not oxygenation. CXR 08/19/23 IMPRESSION: No acute cardiopulmonary process. Discussed the use of AI scribe software for clinical note transcription with the patient, who gave verbal consent to proceed.  History of Present Illness   The patient, with  a history of heart disease and lung disease, presents with fluctuating shortness of breath. He reports using his wife's oxygen at home when needed, with oxygen saturation dropping at times. He describes a sensation of 'choking' and has had some reflux and heartburn. He occasionally coughs up white sputum. He also reports chest pain and discomfort in his back, under the shoulder blades. He has been using a BiPAP machine at night, which keeps him comfortable. He also reports hand tremors and coordination issues.     Assessment and Plan:    COPD mixed type Shortness of breath on exertion Intermittent dyspnea on exertion with oxygen saturation at 97% at rest, dropping at home. No acute cardiopulmonary process on recent chest x-ray. Differential includes cardiac issues, pulmonary issues, or effects of cardiac medications. BiPAP effective nocturnally. Hypotension noted at 102/60, possibly due to cardiac medications. Regular use of albuterol  rescue inhaler and Trelegy maintenance inhaler. Nebulizer provides partial relief. Considering trial of Ohtuvayre  nebulizer medication pending insurance approval; cost prohibitive without coverage. - Assess dyspnea on exertion with ambulation. - Trial Ohtuvayre  nebulizer medication pending insurance approval.  Diabetes mellitus Insurance does not cover diabetes device unless on insulin .   11/26/23- 78 year old male former smoker (30 pk yrs) followed for COPD, restrictive lung disease, cough, OSA, Tracheomalacia, complicated by CAD, CHF, cardiac arrhythmia/Pacemaker/ ICD, HBP, DM  -Trelegy 100, Proair  hfa, prednisone  10 mg QD, Singulair , neb Duoneb, BIPAP 10/6/ [employer] 10 S machine Download- compliance 87%, AHI 5/hr Body weight today 224 lbs Discussed the use of AI scribe software for clinical note transcription with the patient, who gave verbal consent to proceed.  History of Present Illness   [name] [name] is an 78 year old male  who presents for follow-up of his BiPAP therapy.  He  uses a BiPAP machine with settings at ten over six, and the download shows regular usage. He is breathing comfortably with no significant changes.  He experiences occasional, non-productive coughing with no change in frequency. Intermittent back pain occurs with deep breaths, described as a 'twinge'. Breathing meds are ok as discussed with no refills needed now.     Assessment and Plan:    Obstructive Sleep apnea Sleep apnea well-controlled with BiPAP settings of ten over six. Recent BiPAP download confirmed good control. Benefits with more comfortable sleep. - Maintain BiPAP settings at ten over six.  Musculoskeletal chest pain Intermittent chest pain likely due to muscle strain or pinched nerve, possibly related to spinal arthritis. Symptoms benign unless he worsens or changes. - Monitor symptoms and report if pain worsens or changes.   COPD no exacerbation- currently at baseline CXR stable in April -continue meds     ROS-see HPI   + = positive Constitutional:    weight loss, night sweats, fevers, chills, fatigue, lassitude. HEENT:    headaches, difficulty swallowing, tooth/dental problems, sore throat,       sneezing, itching, ear ache, +nasal congestion, +post nasal drip, snoring CV:    +chest pain, orthopnea, PND, swelling in lower extremities, anasarca, dizziness, palpitations Resp:   +shortness of breath with exertion or at rest.                +productive cough,   + non-productive cough, coughing up of blood.              change in color of mucus.  +wheezing.   Skin:    rash or lesions. GI:  No-   heartburn, indigestion, abdominal pain, nausea, vomiting, GU:  MS:   joint pain, stiffness,  Neuro-     nothing unusual Psych:  change in mood or affect.  depression or anxiety.   memory loss.  OBJ- Physical Exam General- Alert, Oriented, Affect-appropriate, Distress- none acute,  Skin- rash-none, lesions- none, excoriation-  none Lymphadenopathy- none Head- atraumatic            Eyes- Gross vision intact, PERRLA, conjunctivae and secretions clear            Ears- Hearing, canals-normal            Nose- Clear, no-Septal dev, mucus, polyps, erosion, perforation             Throat- Mallampati II , mucosa clear , drainage-none, tonsils- atrophic, edentulous+, not hoarse Neck-  Chest - symmetrical excursion , unlabored           Heart/CV- RRR , no murmur , no gallop  , no rub, nl s1 s2                           - JVD- none , edema- none, stasis changes- none, varices- none           Lung- +coarse, + few crackles,  cough-none, dullness-none, rub- none,            Chest wall- +L pacemaker Abd-  Br/ Gen/ Rectal- Not done, not indicated Extrem- cyanosis- none, clubbing, none, atrophy- none, strength- nl, +cane Neuro- grossly intact to observation

## 2023-11-26 ENCOUNTER — Encounter: Payer: Self-pay | Admitting: Internal Medicine

## 2023-11-26 ENCOUNTER — Ambulatory Visit: Admitting: Internal Medicine

## 2023-11-26 VITALS — BP 104/64 | HR 64 | Temp 98.0°F | Ht 72.0 in | Wt 224.4 lb

## 2023-11-26 DIAGNOSIS — G4733 Obstructive sleep apnea (adult) (pediatric): Secondary | ICD-10-CM | POA: Diagnosis not present

## 2023-11-26 DIAGNOSIS — Z87891 Personal history of nicotine dependence: Secondary | ICD-10-CM | POA: Diagnosis not present

## 2023-11-26 NOTE — Patient Instructions (Signed)
 We can continue  BIPAP 10/6- you are doing well with this.  Please let us  know if we can help or if you need refills.

## 2023-11-30 ENCOUNTER — Ambulatory Visit: Attending: Physician Assistant | Admitting: Physician Assistant

## 2023-11-30 ENCOUNTER — Encounter: Payer: Self-pay | Admitting: Physician Assistant

## 2023-11-30 ENCOUNTER — Encounter: Payer: Self-pay | Admitting: Internal Medicine

## 2023-11-30 VITALS — BP 124/84 | HR 63 | Ht 72.0 in | Wt 223.2 lb

## 2023-11-30 DIAGNOSIS — J441 Chronic obstructive pulmonary disease with (acute) exacerbation: Secondary | ICD-10-CM

## 2023-11-30 DIAGNOSIS — I10 Essential (primary) hypertension: Secondary | ICD-10-CM | POA: Diagnosis not present

## 2023-11-30 DIAGNOSIS — I25118 Atherosclerotic heart disease of native coronary artery with other forms of angina pectoris: Secondary | ICD-10-CM

## 2023-11-30 DIAGNOSIS — I4719 Other supraventricular tachycardia: Secondary | ICD-10-CM | POA: Diagnosis not present

## 2023-11-30 DIAGNOSIS — E7849 Other hyperlipidemia: Secondary | ICD-10-CM

## 2023-11-30 DIAGNOSIS — I472 Ventricular tachycardia, unspecified: Secondary | ICD-10-CM | POA: Diagnosis not present

## 2023-11-30 DIAGNOSIS — I5032 Chronic diastolic (congestive) heart failure: Secondary | ICD-10-CM

## 2023-11-30 LAB — TSH: TSH: 1.9 u[IU]/mL (ref 0.450–4.500)

## 2023-11-30 LAB — COMPREHENSIVE METABOLIC PANEL WITH GFR
ALT: 20 IU/L (ref 0–44)
AST: 19 IU/L (ref 0–40)
Albumin: 4.3 g/dL (ref 3.8–4.8)
Alkaline Phosphatase: 72 IU/L (ref 44–121)
BUN/Creatinine Ratio: 16 (ref 10–24)
BUN: 18 mg/dL (ref 8–27)
Bilirubin Total: 0.6 mg/dL (ref 0.0–1.2)
CO2: 21 mmol/L (ref 20–29)
Calcium: 9.4 mg/dL (ref 8.6–10.2)
Chloride: 101 mmol/L (ref 96–106)
Creatinine, Ser: 1.12 mg/dL (ref 0.76–1.27)
Globulin, Total: 2.7 g/dL (ref 1.5–4.5)
Glucose: 146 mg/dL — ABNORMAL HIGH (ref 70–99)
Potassium: 4.5 mmol/L (ref 3.5–5.2)
Sodium: 137 mmol/L (ref 134–144)
Total Protein: 7 g/dL (ref 6.0–8.5)
eGFR: 66 mL/min/1.73 (ref >59)

## 2023-11-30 LAB — CBC
Hematocrit: 46.6 % (ref 37.5–51.0)
Hemoglobin: 15.4 g/dL (ref 13.0–17.7)
MCH: 32.2 pg (ref 26.6–33.0)
MCHC: 33 g/dL (ref 31.5–35.7)
MCV: 98 fL — ABNORMAL HIGH (ref 79–97)
Platelets: 246 x10E3/uL (ref 150–450)
RBC: 4.78 x10E6/uL (ref 4.14–5.80)
RDW: 12.1 % (ref 11.6–15.4)
WBC: 9 x10E3/uL (ref 3.4–10.8)

## 2023-11-30 LAB — LIPID PANEL
Chol/HDL Ratio: 3.5 ratio (ref 0.0–5.0)
Cholesterol, Total: 139 mg/dL (ref 100–199)
HDL: 40 mg/dL (ref >39)
LDL Chol Calc (NIH): 78 mg/dL (ref 0–99)
Triglycerides: 113 mg/dL (ref 0–149)
VLDL Cholesterol Cal: 21 mg/dL (ref 5–40)

## 2023-11-30 NOTE — Patient Instructions (Addendum)
 Medication Instructions:  Your physician recommends that you continue on your current medications as directed. Please refer to the Current Medication list given to you today.  *If you need a refill on your cardiac medications before your next appointment, please call your pharmacy*  Lab Work: TODAY: CBC, CMET, LIPIDS, TSH If you have labs (blood work) drawn today and your tests are completely normal, you will receive your results only by: MyChart Message (if you have MyChart) OR A paper copy in the mail If you have any lab test that is abnormal or we need to change your treatment, we will call you to review the results.  Testing/Procedures: NONE  Follow-Up: At [facility], you and your health needs are our priority.  As part of our continuing mission to provide you with exceptional heart care, our providers are all part of one team.  This team includes your primary Cardiologist (physician) and Advanced Practice Providers or APPs (Physician Assistants and Nurse Practitioners) who all work together to provide you with the care you need, when you need it.  Your next appointment:   6 month(s)  Provider:   Stanly DELENA Leavens, MD   Your physician recommends that you schedule a follow-up appointment in: 1 MONTH WITH AN EP APP    We recommend signing up for the patient portal called MyChart.  Sign up information is provided on this After Visit Summary.  MyChart is used to connect with patients for Virtual Visits (Telemedicine).  Patients are able to view lab/test results, encounter notes, upcoming appointments, etc.  Non-urgent messages can be sent to your provider as well.   To learn more about what you can do with MyChart, go to [email or web address].

## 2023-12-01 ENCOUNTER — Ambulatory Visit: Payer: Self-pay | Admitting: Physician Assistant

## 2023-12-07 NOTE — Telephone Encounter (Signed)
 Pt daughter called in for results

## 2023-12-08 NOTE — Telephone Encounter (Deleted)
 Lvmtcb and MyChart emssage sent to

## 2023-12-25 NOTE — Progress Notes (Unsigned)
  Electrophysiology Office Note:   Date:  12/28/2023  ID:  [name], [name] 10/29/1945, MRN [id number]  Primary Cardiologist: Stanly DELENA Leavens, MD Primary Heart Failure: None Electrophysiologist: Fonda Kitty, MD       History of Present Illness:   [name] [name] is a 78 y.o. male with h/o chronotropic incompetence, SND s/p PPM, LBBB, AT, PVC's, HTN, HLD, COPD, CKD, DM II seen today for acute visit for medication follow up (clarification of mexiletine).    Pt was seen acutely in Cardiology Clinic by Dr. Leavens for near syncope.  Device interrogation showed numerous HVR episodes, NSVT, elevated PVC burden.  He was started on Mexiletine 150 mg TID 07/2023 and was discontinued in 08/2023 for unclear reasons.   Patient & daughter report that they do not believe they ever started the mexiletine.      He denies chest pain, palpitations, dyspnea, PND, orthopnea, nausea, vomiting, dizziness, syncope, edema, weight gain, or early satiety.   Review of systems complete and found to be negative unless listed in HPI.    EP Information / Studies Reviewed:    EKG is not ordered today. EKG from 08/26/23 reviewed which showed AP with prolonged AV conduction, 75 bpm      PPM Interrogation-  reviewed in detail today,  See PACEART report.  Device History: Medtronic Dual Chamber PPM implanted 8/20 for Sinus Node Dysfunction  Risk Assessment/Calculations:              Physical Exam:   VS:  BP 118/86   Pulse 80   Ht 6' (1.829 m)   Wt 217 lb (98.4 kg)   SpO2 97%   BMI 29.43 kg/m    Wt Readings from Last 3 Encounters:  12/28/23 217 lb (98.4 kg)  11/30/23 223 lb 3.2 oz (101.2 kg)  11/26/23 224 lb 6.4 oz (101.8 kg)     GEN: Well nourished, well developed in no acute distress NECK: No JVD; No carotid bruits CARDIAC: Regular rate and rhythm, no murmurs, rubs, gallops RESPIRATORY:  Clear to auscultation without rales, wheezing or rhonchi  ABDOMEN: Soft, non-tender,  non-distended EXTREMITIES:  No edema; No deformity   ASSESSMENT AND PLAN:    SND, Chronotropic Incompetence s/p Medtronic PPM  -Normal PPM function -See Pace Art report -No changes today  PAC's / PVC's  NSVT / VT  AT  -AT/AF <0.1% -continue metoprolol  25 mg BID, diltiazem  SR 120 mg daily  -begin mexiletine 150 mg BID for PVC's -will bring back in 3 months to review PVC burden  Hypertension  -well controlled on current regimen  -hx of hypotension with NTG use for non-cardiac CP   HFpEF  -euvolemic on exam     Disposition:   Follow up with Dr. Kitty / EP APP 3 months .  Transition to Dr. Kitty from Dr. Fernande.   Signed, Daphne Barrack, NP-C, AGACNP-BC [facility] - Electrophysiology  12/28/2023, 1:29 PM

## 2023-12-28 ENCOUNTER — Ambulatory Visit: Attending: Pulmonary Disease | Admitting: Pulmonary Disease

## 2023-12-28 ENCOUNTER — Encounter: Payer: Self-pay | Admitting: Pulmonary Disease

## 2023-12-28 VITALS — BP 118/86 | HR 80 | Ht 72.0 in | Wt 217.0 lb

## 2023-12-28 DIAGNOSIS — I10 Essential (primary) hypertension: Secondary | ICD-10-CM

## 2023-12-28 DIAGNOSIS — I5032 Chronic diastolic (congestive) heart failure: Secondary | ICD-10-CM | POA: Diagnosis not present

## 2023-12-28 DIAGNOSIS — I493 Ventricular premature depolarization: Secondary | ICD-10-CM

## 2023-12-28 DIAGNOSIS — Z950 Presence of cardiac pacemaker: Secondary | ICD-10-CM

## 2023-12-28 DIAGNOSIS — I4719 Other supraventricular tachycardia: Secondary | ICD-10-CM

## 2023-12-28 DIAGNOSIS — I495 Sick sinus syndrome: Secondary | ICD-10-CM

## 2023-12-28 DIAGNOSIS — I472 Ventricular tachycardia, unspecified: Secondary | ICD-10-CM

## 2023-12-28 LAB — CUP PACEART INCLINIC DEVICE CHECK
Date Time Interrogation Session: 20250825133043
Implantable Lead Connection Status: 753985
Implantable Lead Connection Status: 753985
Implantable Lead Implant Date: 20070820
Implantable Lead Implant Date: 20070820
Implantable Lead Location: 753859
Implantable Lead Location: 753860
Implantable Lead Model: 4092
Implantable Lead Model: 5076
Implantable Pulse Generator Implant Date: 20170222

## 2023-12-28 MED ORDER — MEXILETINE HCL 150 MG PO CAPS
150.0000 mg | ORAL_CAPSULE | Freq: Two times a day (BID) | ORAL | 3 refills | Status: AC
Start: 2023-12-28 — End: ?

## 2023-12-28 MED ORDER — FUROSEMIDE 40 MG PO TABS: 40.0000 mg | ORAL_TABLET | Freq: Every day | ORAL | 1 refills | Status: AC | PRN

## 2023-12-28 NOTE — Patient Instructions (Signed)
 Medication Instructions:  Start mexiletine 150 mg twice daily *If you need a refill on your cardiac medications before your next appointment, please call your pharmacy*  Lab Work: None ordered If you have labs (blood work) drawn today and your tests are completely normal, you will receive your results only by: MyChart Message (if you have MyChart) OR A paper copy in the mail If you have any lab test that is abnormal or we need to change your treatment, we will call you to review the results.  Follow-Up: At [facility], you and your health needs are our priority.  As part of our continuing mission to provide you with exceptional heart care, our providers are all part of one team.  This team includes your primary Cardiologist (physician) and Advanced Practice Providers or APPs (Physician Assistants and Nurse Practitioners) who all work together to provide you with the care you need, when you need it.  Your next appointment:   3 month(s)  Provider:   Daphne Barrack, NP

## 2024-01-03 ENCOUNTER — Ambulatory Visit: Payer: Self-pay | Admitting: Cardiology

## 2024-01-21 ENCOUNTER — Other Ambulatory Visit: Payer: Self-pay | Admitting: Internal Medicine

## 2024-01-22 NOTE — Telephone Encounter (Signed)
 Refill request for prednisone .  Per chart OV from 11/26/2023: -continue meds  Return in about 6 months (around 05/28/2024).   OK refill x 4.

## 2024-02-04 ENCOUNTER — Other Ambulatory Visit: Payer: Self-pay | Admitting: Internal Medicine

## 2024-02-05 NOTE — Telephone Encounter (Signed)
 Refill for DuoNeb okay x one.

## 2024-02-08 ENCOUNTER — Other Ambulatory Visit: Payer: Self-pay

## 2024-02-08 ENCOUNTER — Telehealth: Payer: Self-pay | Admitting: Pulmonary Disease

## 2024-02-08 ENCOUNTER — Other Ambulatory Visit (HOSPITAL_COMMUNITY): Payer: Self-pay

## 2024-02-08 MED ORDER — DILTIAZEM HCL ER 120 MG PO CP12
120.0000 mg | ORAL_CAPSULE | Freq: Every day | ORAL | 3 refills | Status: AC
  Filled 2024-02-08 (×3): qty 90, 90d supply, fill #0
  Filled 2024-04-23: qty 90, 90d supply, fill #1

## 2024-02-08 NOTE — Telephone Encounter (Signed)
 RX sent in to [facility]

## 2024-02-08 NOTE — Telephone Encounter (Signed)
 *  STAT* If patient is at the pharmacy, call can be transferred to refill team.   1. Which medications need to be refilled? (please list name of each medication and dose if known)   diltiazem  (CARDIZEM  SR) 120 MG 12 hr capsule     2. Would you like to learn more about the convenience, safety, & potential cost savings by using the [facility]? No    3. Are you open to using the [facility] (Type [facility]. ). No    4. Which pharmacy/location (including street and city if local pharmacy) is medication to be sent to?[facility] in [place]    5. Do they need a 30 day or 90 day supply? 90 days  [facility] doesn't have this medication in stock and patient been out of medication. Please send to come pharmacy in [place] and call patient' daughter Grayce once prescription sent

## 2024-02-09 ENCOUNTER — Other Ambulatory Visit (HOSPITAL_COMMUNITY): Payer: Self-pay

## 2024-02-15 ENCOUNTER — Other Ambulatory Visit: Payer: Self-pay | Admitting: Internal Medicine

## 2024-03-10 ENCOUNTER — Encounter (INDEPENDENT_AMBULATORY_CARE_PROVIDER_SITE_OTHER): Payer: 59 | Admitting: Ophthalmology

## 2024-03-10 DIAGNOSIS — I10 Essential (primary) hypertension: Secondary | ICD-10-CM

## 2024-03-10 DIAGNOSIS — E113293 Type 2 diabetes mellitus with mild nonproliferative diabetic retinopathy without macular edema, bilateral: Secondary | ICD-10-CM | POA: Diagnosis not present

## 2024-03-10 DIAGNOSIS — Z7984 Long term (current) use of oral hypoglycemic drugs: Secondary | ICD-10-CM

## 2024-03-10 DIAGNOSIS — H35033 Hypertensive retinopathy, bilateral: Secondary | ICD-10-CM

## 2024-03-10 DIAGNOSIS — H43813 Vitreous degeneration, bilateral: Secondary | ICD-10-CM

## 2024-03-14 NOTE — Progress Notes (Unsigned)
  Electrophysiology Office Note:   Date:  03/14/2024  ID:  [name] [name], [name] [name] 1945-12-25, MRN [id number]  Primary Cardiologist: Stanly DELENA Leavens, MD Primary Heart Failure: None Electrophysiologist: Fonda Kitty, MD   {Click to update primary MD,subspecialty MD or APP then REFRESH:1}    History of Present Illness:   [name] [name] is a 78 y.o. male with h/o  chronotropic incompetence, SND s/p PPM, LBBB, AT, PVC's, HTN, HLD, COPD, CKD, DM II seen today for routine electrophysiology followup.   Since last being seen in our clinic the patient reports doing ***.    He *** denies chest pain, palpitations, dyspnea, PND, orthopnea, nausea, vomiting, dizziness, syncope, edema, weight gain, or early satiety.   Review of systems complete and found to be negative unless listed in HPI.   EP Information / Studies Reviewed:    EKG is not ordered today. EKG from 08/26/23 reviewed which showed AP with prolonged AV conduction, 75 bpm       PPM Interrogation-  limited review in clinic for PVC burden ***  Device History: Medtronic Dual Chamber PPM implanted 12/2018 for Sinus Node Dysfunction  Risk Assessment/Calculations:     No BP recorded.  {Refresh Note OR Click here to enter BP  :1}***        Physical Exam:   VS:  There were no vitals taken for this visit.   Wt Readings from Last 3 Encounters:  12/28/23 217 lb (98.4 kg)  11/30/23 223 lb 3.2 oz (101.2 kg)  11/26/23 224 lb 6.4 oz (101.8 kg)     GEN: Well nourished, well developed in no acute distress NECK: No JVD; No carotid bruits CARDIAC: {EPRHYTHM:28826}, no murmurs, rubs, gallops RESPIRATORY:  Clear to auscultation without rales, wheezing or rhonchi  ABDOMEN: Soft, non-tender, non-distended EXTREMITIES:  No edema; No deformity   ASSESSMENT AND PLAN:    SND, Chronotropic Incompetence s/p Medtronic PPM  -limited device check for PVC burden *** -full in-clinic device check 12/28/23   PAC's / PVC's  NSVT / VT  AT   -Metoprolol  25 mg BID  -Diltiazem  SR 120 mg daily  -continue mexiletine 150 mg BID  -***% PVC burden by device   Hypertension  -well controlled on current regimen ***  HFpEF  -euvolemic on exam ***    Disposition:   Follow up with Dr. Kitty {EPFOLLOW LE:71826}  Signed, Daphne Barrack, NP-C, AGACNP-BC [facility] - Electrophysiology  03/14/2024, 3:53 PM

## 2024-03-21 ENCOUNTER — Ambulatory Visit: Payer: 59 | Admitting: Internal Medicine

## 2024-03-21 NOTE — Progress Notes (Unsigned)
 HPI  M former smoker with COPD, chronic cough, OSA complicated by Cardiac dysrhythmia/ pacemaker, HBP  Office Spirometry 08/11/14-moderate obstruction/restriction-FVC 2.95/62%, FEV1 2.37/65%, ratio 0.79, FEF 25-75% 2.43/77% PFT 10/26/2017-normal flows without response to bronchodilator, /moderate diffusion defect. Walk Test O2 Qualifying-04/26/2018-qualified for portable oxygen-during his second lap heart rate reached 112 and saturation fell to 86% on room air Labs 04/26/2018- IgE 464, EOS wnl HST 05/17/18>> Severe obstructive sleep apnea AHI:33 CPAP to BIPAP titration 02/18/2019-  BIPAP 10/6, if not tolerated try CPAP 7  Cardiac PET 6/25, WNL low risk Walk Test on room air 08/27/23- 2 laps wobbly and too dyspneic for 3rd lap, but lowest O2 96%, max HR 95/min, suggesting cardiac/ deconditioning limitation, not oxygenation. -----------------------------------------------------------------------------------------    11/26/23- 78 year old male former smoker (30 pk yrs) followed for COPD, restrictive lung disease, cough, OSA, Tracheomalacia, complicated by CAD, CHF, cardiac arrhythmia/Pacemaker/ ICD, HBP, DM  -Trelegy 100, Proair  hfa, prednisone  10 mg QD, Singulair , neb Duoneb, BIPAP 10/6/ [employer] 10 S machine Download- compliance 87%, AHI 5/hr Body weight today 224 lbs Discussed the use of AI scribe software for clinical note transcription with the patient, who gave verbal consent to proceed.  History of Present Illness   [name] [name] is an 78 year old male who presents for follow-up of his BiPAP therapy.  He uses a BiPAP machine with settings at ten over six, and the download shows regular usage. He is breathing comfortably with no significant changes.  He experiences occasional, non-productive coughing with no change in frequency. Intermittent back pain occurs with deep breaths, described as a 'twinge'. Breathing meds are ok as discussed with no refills needed  now.     Assessment and Plan:    Obstructive Sleep apnea Sleep apnea well-controlled with BiPAP settings of ten over six. Recent BiPAP download confirmed good control. Benefits with more comfortable sleep. - Maintain BiPAP settings at ten over six.  Musculoskeletal chest pain Intermittent chest pain likely due to muscle strain or pinched nerve, possibly related to spinal arthritis. Symptoms benign unless he worsens or changes. - Monitor symptoms and report if pain worsens or changes.  COPD mixed type no exacerbation- currently at baseline CXR stable in April -continue meds    03/22/24- 78 year old male former smoker (30 pk yrs) followed for COPD, restrictive lung disease, cough, OSA, Tracheomalacia, complicated by CAD, CHF, cardiac arrhythmia/Pacemaker/ ICD, HBP, DM  -Trelegy 100, Proair  hfa, prednisone  10 mg QD, Singulair , neb Duoneb, BIPAP 10/6/ [employer] 10 S machine Download- compliance 100%, AHI 2.4/hr Body weight today 222 lbs Respiratory illness in /summer, but back to baseline now. While ill had some trouble wearing CPAP, but comfortable and back to routine daily use with benefit. Asks Flu vax CXR 08/19/23 IMPRESSION: No acute cardiopulmonary process.    ROS-see HPI   + = positive Constitutional:    weight loss, night sweats, fevers, chills, fatigue, lassitude. HEENT:    headaches, difficulty swallowing, tooth/dental problems, sore throat,       sneezing, itching, ear ache, +nasal congestion, +post nasal drip, snoring CV:    +chest pain, orthopnea, PND, swelling in lower extremities, anasarca, dizziness, palpitations Resp:   +shortness of breath with exertion or at rest.                +productive cough,   + non-productive cough, coughing up of blood.              change  in color of mucus.  +wheezing.   Skin:    rash or lesions. GI:  No-   heartburn, indigestion, abdominal pain, nausea, vomiting, GU:  MS:   joint pain, stiffness,  Neuro-      nothing unusual Psych:  change in mood or affect.  depression or anxiety.   memory loss.  OBJ- Physical Exam General- Alert, Oriented, Affect-appropriate, Distress- none acute,  Skin- rash-none, lesions- none, excoriation- none Lymphadenopathy- none Head- atraumatic            Eyes- Gross vision intact, PERRLA, conjunctivae and secretions clear            Ears- Hearing, canals-normal            Nose- Clear, no-Septal dev, mucus, polyps, erosion, perforation             Throat- Mallampati II , mucosa clear , drainage-none, tonsils- atrophic, edentulous+, not hoarse Neck-  Chest - symmetrical excursion , unlabored           Heart/CV- RRR , no murmur , no gallop  , no rub, nl s1 s2                           - JVD- none , edema- none, stasis changes- none, varices- none           Lung- +clear,  cough-none, dullness-none, rub- none,            Chest wall- +L pacemaker Abd-  Br/ Gen/ Rectal- Not done, not indicated Extrem- cyanosis- none, clubbing, none, atrophy- none, strength- nl, +cane Neuro- grossly intact to observation

## 2024-03-22 ENCOUNTER — Encounter: Payer: Self-pay | Admitting: Internal Medicine

## 2024-03-22 ENCOUNTER — Ambulatory Visit (INDEPENDENT_AMBULATORY_CARE_PROVIDER_SITE_OTHER): Admitting: Internal Medicine

## 2024-03-22 VITALS — BP 126/76 | HR 94 | Temp 97.8°F | Ht 72.0 in | Wt 222.2 lb

## 2024-03-22 DIAGNOSIS — G4733 Obstructive sleep apnea (adult) (pediatric): Secondary | ICD-10-CM

## 2024-03-22 DIAGNOSIS — Z23 Encounter for immunization: Secondary | ICD-10-CM | POA: Diagnosis not present

## 2024-03-22 DIAGNOSIS — J441 Chronic obstructive pulmonary disease with (acute) exacerbation: Secondary | ICD-10-CM | POA: Diagnosis not present

## 2024-03-22 NOTE — Assessment & Plan Note (Signed)
 Benefits from BIPAP with good compliance and control Plan- continue 10/6

## 2024-03-22 NOTE — Patient Instructions (Addendum)
 Your current breathing medicines seem fine- let us  know if you need refills  You are doing very well with your BIPAP machine- We can continue 10/6, mask of choice, humidifier, supplies, AirView/ card  Order- flu vax senior   At check out, ask at the desk to come back with one of our sleep doctors

## 2024-03-22 NOTE — Assessment & Plan Note (Signed)
 Exacerbation resolved and now back to baseline. Plan- continue routine meds as reviewed.

## 2024-03-23 ENCOUNTER — Ambulatory Visit: Attending: Pulmonary Disease | Admitting: Pulmonary Disease

## 2024-03-23 ENCOUNTER — Encounter: Payer: Self-pay | Admitting: Pulmonary Disease

## 2024-03-23 VITALS — BP 106/68 | HR 68 | Ht 72.0 in | Wt 225.0 lb

## 2024-03-23 DIAGNOSIS — Z950 Presence of cardiac pacemaker: Secondary | ICD-10-CM | POA: Diagnosis not present

## 2024-03-23 DIAGNOSIS — I4719 Other supraventricular tachycardia: Secondary | ICD-10-CM | POA: Diagnosis not present

## 2024-03-23 DIAGNOSIS — I495 Sick sinus syndrome: Secondary | ICD-10-CM

## 2024-03-23 DIAGNOSIS — I10 Essential (primary) hypertension: Secondary | ICD-10-CM

## 2024-03-23 DIAGNOSIS — I493 Ventricular premature depolarization: Secondary | ICD-10-CM | POA: Diagnosis not present

## 2024-03-23 DIAGNOSIS — I5032 Chronic diastolic (congestive) heart failure: Secondary | ICD-10-CM

## 2024-03-23 NOTE — Patient Instructions (Signed)
 Medication Instructions:  Take your PRN lasix  this week on Thursday, Saturday and then again next Tuesday *If you need a refill on your cardiac medications before your next appointment, please call your pharmacy*  Lab Work: BMET-next Wednesday If you have labs (blood work) drawn today and your tests are completely normal, you will receive your results only by: MyChart Message (if you have MyChart) OR A paper copy in the mail If you have any lab test that is abnormal or we need to change your treatment, we will call you to review the results.  Follow-Up: At [facility], you and your health needs are our priority.  As part of our continuing mission to provide you with exceptional heart care, our providers are all part of one team.  This team includes your primary Cardiologist (physician) and Advanced Practice Providers or APPs (Physician Assistants and Nurse Practitioners) who all work together to provide you with the care you need, when you need it.  Your next appointment:   Next available  Provider:   Olivia Pavy, PA-C

## 2024-03-29 LAB — BASIC METABOLIC PANEL WITH GFR
BUN/Creatinine Ratio: 17 (ref 10–24)
BUN: 23 mg/dL (ref 8–27)
CO2: 24 mmol/L (ref 20–29)
Calcium: 9.4 mg/dL (ref 8.6–10.2)
Chloride: 97 mmol/L (ref 96–106)
Creatinine, Ser: 1.36 mg/dL — ABNORMAL HIGH (ref 0.76–1.27)
Glucose: 272 mg/dL — ABNORMAL HIGH (ref 70–99)
Potassium: 4.1 mmol/L (ref 3.5–5.2)
Sodium: 137 mmol/L (ref 134–144)
eGFR: 53 mL/min/1.73 — ABNORMAL LOW (ref >59)

## 2024-03-30 ENCOUNTER — Ambulatory Visit: Payer: Self-pay | Admitting: Pulmonary Disease

## 2024-04-04 NOTE — Progress Notes (Signed)
 Cardiology Office Note:  .   Date:  04/18/2024  ID:  [name] [name], DOB [birth year], MRN [id number] PCP: Arloa Elsie SAUNDERS, MD  [facility] Providers Cardiologist:  Stanly DELENA Leavens, MD Electrophysiologist:  Fonda Kitty, MD    History of Present Illness: [name]   ALECXIS [name] is a 78 y.o. male   with h/o chronotropic incompetence, SND s/p PPM, LBBB, AT, PVC's, HTN, HLD, COPD, CKD, DM II with atrial tachycardia, PACs, PVCs, and sinus node dysfunction. He has a history of heart failure with preserved ejection fraction (HEPF) and chronic obstructive pulmonary disease (COPD).  Patient saw Dr. Leavens 07/29/23 with near syncope. Device interrogation notes significant NSVT and started on mexiletine.    Patient saw Dr. Klien 08/26/23 with presyncope and low BP. Metoprolol  decreased from 50 mg to 25 mg bid, stopped benicar. Chest pain felt to be noncardiac and asked him not to use NTG with hypotensionwith atrial tachycardia, PACs, PVCs, and sinus node dysfunction.    Patient saw Dr. Leavens 07/29/23 with near syncope. Device interrogation notes significant NSVT and started on mexiletine.    Patient saw Dr. Klien 08/26/23 with presyncope and low BP. Metoprolol  decreased from 50 mg to 25 mg bid, stopped benicar. Chest pain felt to be noncardiac and asked him not to use NTG with hypotension.  Patient seen by EPS 03/23/24 with edema and dietery indiscretions and episode of chest pain. Was given lasix  to take 3 days that week. Also rash in groins on jardiance .  Patient here with his daughter. Weight same as it was in Nov. He ate meatloaf, chicken, potatoes and high salt last night. Weight at home 220 lbs. Crt 1.36 03/29/25.  Was supposed to try reducing lasix  to twice a week. He continues to eat extra salt and sugar. He's taking lasix  3 times a week.He denies chest pain, dyspnea, edema. Rash in going better with using gold bond. His daughter says he staggers and is off balance. No  dizziness. Has an appt with PCP at the end of Dec and will discuss possible PT.  ROS:    Studies Reviewed: [name]         Prior CV Studies:    Echo 06/2023 IMPRESSIONS     1. Left ventricular ejection fraction, by estimation, is 50 to 55%. The  left ventricle has low normal function. The left ventricle demonstrates  regional wall motion abnormalities (see scoring diagram/findings for  description). There is mild left  ventricular hypertrophy. Left ventricular diastolic parameters are  consistent with Grade I diastolic dysfunction (impaired relaxation).   2. Right ventricular systolic function is low normal. The right  ventricular size is normal. Tricuspid regurgitation signal is inadequate  for assessing PA pressure.   3. The mitral valve is normal in structure. Trivial mitral valve  regurgitation. No evidence of mitral stenosis.   4. The aortic valve has an indeterminant number of cusps. Aortic valve  regurgitation is not visualized. Aortic valve sclerosis is present, with  no evidence of aortic valve stenosis.   Comparison(s): A prior study was performed on 02/01/2021. Prior images  reviewed side by side. LVEF remains stable at 50-55%, RWMA were not  reported on prior studies but they were present, see prior report for  details.    Risk Assessment/Calculations:             Physical Exam:   VS:  BP 114/76   Pulse 90   Ht 6' (1.829 m)   Wt 226 lb 12.8  oz (102.9 kg)   SpO2 96%   BMI 30.76 kg/m    Orhtostatics: No data found. Wt Readings from Last 3 Encounters:  04/18/24 226 lb 12.8 oz (102.9 kg)  03/23/24 225 lb (102.1 kg)  03/22/24 222 lb 3.2 oz (100.8 kg)    GEN: Obese, in no acute distress NECK: No JVD; No carotid bruits CARDIAC:  RRR, no murmurs, rubs, gallops RESPIRATORY:  Clear to auscultation without rales, wheezing or rhonchi  ABDOMEN: Soft, non-tender, non-distended EXTREMITIES:  No edema; No deformity   ASSESSMENT AND PLAN: .    SND, Chronotropic  Incompetence s/p [employer] PPM  -limited device check for PVC burden > prior burden over ~ 3.5 months was 396,373 and current burden in 3 months after starting mexiletine was 224,180.   -pt reports tolerance of mexiletine / no abd issues  -see full in-clinic device check 12/28/23    PAC's / PVC's  NSVT / VT  AT  -Metoprolol  25 mg BID  -Diltiazem  SR 120 mg daily  -continue mexiletine 150 mg BID  -PVC burden as above    Hypertension  -well controlled on current regimen    HFpEF  -weight up 226 lbs on Lasix  3 times a week. Crt up 1.36 will recheck today. - Appears euvolemic  LVEF 50-55%.  -he reports rash in his groins on jardiance  doing much better. Will continue for now.   Chest Discomfort  -no further chest pain -he thinks a lot of it was indigestion.            Dispo: f/u Dr. JAYSON 6 months.  Signed, Olivia Pavy, PA-C

## 2024-04-18 ENCOUNTER — Ambulatory Visit: Attending: Physician Assistant | Admitting: Physician Assistant

## 2024-04-18 ENCOUNTER — Encounter: Payer: Self-pay | Admitting: Physician Assistant

## 2024-04-18 VITALS — BP 114/76 | HR 90 | Ht 72.0 in | Wt 226.8 lb

## 2024-04-18 DIAGNOSIS — I495 Sick sinus syndrome: Secondary | ICD-10-CM

## 2024-04-18 DIAGNOSIS — Z950 Presence of cardiac pacemaker: Secondary | ICD-10-CM

## 2024-04-18 DIAGNOSIS — I472 Ventricular tachycardia, unspecified: Secondary | ICD-10-CM

## 2024-04-18 DIAGNOSIS — I10 Essential (primary) hypertension: Secondary | ICD-10-CM

## 2024-04-18 DIAGNOSIS — I5032 Chronic diastolic (congestive) heart failure: Secondary | ICD-10-CM | POA: Diagnosis not present

## 2024-04-18 DIAGNOSIS — R079 Chest pain, unspecified: Secondary | ICD-10-CM

## 2024-04-18 NOTE — Patient Instructions (Addendum)
 Medication Instructions:  Your physician recommends that you continue on your current medications as directed. Please refer to the Current Medication list given to you today.  *If you need a refill on your cardiac medications before your next appointment, please call your pharmacy*  Lab Work: Today: BMP If you have labs (blood work) drawn today and your tests are completely normal, you will receive your results only by: MyChart Message (if you have MyChart) OR A paper copy in the mail If you have any lab test that is abnormal or we need to change your treatment, we will call you to review the results.  Testing/Procedures: None   Follow-Up: At [facility], you and your health needs are our priority.  As part of our continuing mission to provide you with exceptional heart care, our providers are all part of one team.  This team includes your primary Cardiologist (physician) and Advanced Practice Providers or APPs (Physician Assistants and Nurse Practitioners) who all work together to provide you with the care you need, when you need it.  Your next appointment:   6 month(s)  Provider:   Stanly DELENA Leavens, MD    We recommend signing up for the patient portal called MyChart.  Sign up information is provided on this After Visit Summary.  MyChart is used to connect with patients for Virtual Visits (Telemedicine).  Patients are able to view lab/test results, encounter notes, upcoming appointments, etc.  Non-urgent messages can be sent to your provider as well.   To learn more about what you can do with MyChart, go to [email or web address].   Other Instructions None

## 2024-04-19 ENCOUNTER — Ambulatory Visit: Payer: Self-pay | Admitting: Physician Assistant

## 2024-04-19 LAB — BASIC METABOLIC PANEL WITH GFR
BUN/Creatinine Ratio: 16 (ref 10–24)
BUN: 20 mg/dL (ref 8–27)
CO2: 22 mmol/L (ref 20–29)
Calcium: 9.1 mg/dL (ref 8.6–10.2)
Chloride: 100 mmol/L (ref 96–106)
Creatinine, Ser: 1.24 mg/dL (ref 0.76–1.27)
Glucose: 340 mg/dL — ABNORMAL HIGH (ref 70–99)
Potassium: 4.5 mmol/L (ref 3.5–5.2)
Sodium: 137 mmol/L (ref 134–144)
eGFR: 59 mL/min/1.73 — ABNORMAL LOW (ref >59)

## 2024-04-22 ENCOUNTER — Other Ambulatory Visit: Payer: Self-pay | Admitting: Internal Medicine

## 2024-04-25 ENCOUNTER — Other Ambulatory Visit (HOSPITAL_COMMUNITY): Payer: Self-pay

## 2024-04-25 ENCOUNTER — Other Ambulatory Visit: Payer: Self-pay

## 2024-05-12 ENCOUNTER — Other Ambulatory Visit: Payer: Self-pay | Admitting: Internal Medicine

## 2024-05-26 ENCOUNTER — Telehealth: Payer: Self-pay | Admitting: *Deleted

## 2024-05-26 DIAGNOSIS — G4733 Obstructive sleep apnea (adult) (pediatric): Secondary | ICD-10-CM

## 2024-05-26 NOTE — Telephone Encounter (Signed)
 CY patient last seen in Nov 2025. Per plan order was not placed for Bipap supplies. Saw TP 11/04/22. Copied from CRM [id number]. Topic: Clinical - Order For Equipment >> May 25, 2024 10:55 AM Benton KIDD wrote: Patient is calling back about a new order for a mask for his bipap machine please give patient a call concerning this information . Sorry put cpap in first message  [phone] Patient says he can't use his bipap machine without the mask

## 2024-06-07 ENCOUNTER — Telehealth: Payer: Self-pay

## 2024-06-07 NOTE — Telephone Encounter (Signed)
 Patient daughter ms robin is calling because she just spoke with someone today my daddy has a bipap and his mask tore up he is with [employer] and they told him he needs a refill the lady i just spoke with say tammy parrett np put in a order January 22nd so i called [employer] and they say they  have not rceived anything . they said they would call but im calling because he has been without his bipap for 3 weeks because of the mask . Please get order sent over because [employer] is saying they have not received anything

## 2024-06-07 NOTE — Telephone Encounter (Signed)
 Spoke with [name] [name] to inform her I have resent [name] [name]' order/ referral to [facility]

## 2024-09-12 ENCOUNTER — Encounter: Admitting: Pulmonary Disease

## 2024-10-03 ENCOUNTER — Ambulatory Visit: Admitting: Internal Medicine

## 2025-03-09 ENCOUNTER — Encounter (INDEPENDENT_AMBULATORY_CARE_PROVIDER_SITE_OTHER): Admitting: Ophthalmology
